# Patient Record
Sex: Male | Born: 1960 | Race: Black or African American | Hispanic: No | Marital: Single | Smoking: Never smoker
Health system: Southern US, Community
[De-identification: ages and names within clinical notes are randomized; demographics above are authoritative.]

## PROBLEM LIST (undated history)

## (undated) DIAGNOSIS — F1010 Alcohol abuse, uncomplicated: Secondary | ICD-10-CM

## (undated) DIAGNOSIS — I509 Heart failure, unspecified: Secondary | ICD-10-CM

## (undated) DIAGNOSIS — M1990 Unspecified osteoarthritis, unspecified site: Secondary | ICD-10-CM

## (undated) DIAGNOSIS — N189 Chronic kidney disease, unspecified: Secondary | ICD-10-CM

## (undated) DIAGNOSIS — K219 Gastro-esophageal reflux disease without esophagitis: Secondary | ICD-10-CM

## (undated) DIAGNOSIS — D649 Anemia, unspecified: Secondary | ICD-10-CM

## (undated) DIAGNOSIS — I10 Essential (primary) hypertension: Secondary | ICD-10-CM

## (undated) DIAGNOSIS — M109 Gout, unspecified: Secondary | ICD-10-CM

## (undated) DIAGNOSIS — S32009A Unspecified fracture of unspecified lumbar vertebra, initial encounter for closed fracture: Secondary | ICD-10-CM

## (undated) DIAGNOSIS — C61 Malignant neoplasm of prostate: Secondary | ICD-10-CM

## (undated) DIAGNOSIS — K7460 Unspecified cirrhosis of liver: Secondary | ICD-10-CM

## (undated) DIAGNOSIS — Z9289 Personal history of other medical treatment: Secondary | ICD-10-CM

## (undated) HISTORY — PX: FRACTURE SURGERY: SHX138

## (undated) HISTORY — DX: Chronic kidney disease, unspecified: N18.9

## (undated) HISTORY — PX: ACHILLES TENDON REPAIR: SUR1153

## (undated) HISTORY — DX: Essential (primary) hypertension: I10

## (undated) HISTORY — DX: Unspecified cirrhosis of liver: K74.60

## (undated) HISTORY — DX: Unspecified fracture of unspecified lumbar vertebra, initial encounter for closed fracture: S32.009A

## (undated) HISTORY — DX: Anemia, unspecified: D64.9

---

## 1999-05-31 ENCOUNTER — Ambulatory Visit (HOSPITAL_BASED_OUTPATIENT_CLINIC_OR_DEPARTMENT_OTHER): Admission: RE | Admit: 1999-05-31 | Discharge: 1999-05-31 | Payer: Self-pay | Admitting: Orthopedic Surgery

## 2008-10-08 ENCOUNTER — Emergency Department (HOSPITAL_COMMUNITY): Admission: EM | Admit: 2008-10-08 | Discharge: 2008-10-08 | Payer: Self-pay | Admitting: Emergency Medicine

## 2010-02-23 DIAGNOSIS — K7030 Alcoholic cirrhosis of liver without ascites: Secondary | ICD-10-CM

## 2010-03-20 ENCOUNTER — Inpatient Hospital Stay (HOSPITAL_COMMUNITY): Admission: EM | Admit: 2010-03-20 | Discharge: 2010-03-29 | Payer: Self-pay | Admitting: Emergency Medicine

## 2010-03-20 ENCOUNTER — Ambulatory Visit: Payer: Self-pay | Admitting: Family Medicine

## 2010-03-22 ENCOUNTER — Ambulatory Visit: Payer: Self-pay | Admitting: Gastroenterology

## 2010-04-09 ENCOUNTER — Inpatient Hospital Stay (HOSPITAL_COMMUNITY): Admission: EM | Admit: 2010-04-09 | Discharge: 2010-05-06 | Payer: Self-pay | Admitting: Emergency Medicine

## 2010-04-09 ENCOUNTER — Ambulatory Visit: Payer: Self-pay | Admitting: Family Medicine

## 2010-04-21 ENCOUNTER — Encounter: Payer: Self-pay | Admitting: Family Medicine

## 2010-04-26 ENCOUNTER — Ambulatory Visit: Payer: Self-pay | Admitting: Physical Medicine & Rehabilitation

## 2010-05-18 ENCOUNTER — Inpatient Hospital Stay (HOSPITAL_COMMUNITY): Admission: EM | Admit: 2010-05-18 | Discharge: 2010-06-07 | Payer: Self-pay | Admitting: Emergency Medicine

## 2010-05-18 ENCOUNTER — Ambulatory Visit: Payer: Self-pay | Admitting: Family Medicine

## 2010-05-25 ENCOUNTER — Ambulatory Visit: Payer: Self-pay | Admitting: Gastroenterology

## 2010-05-26 ENCOUNTER — Encounter: Payer: Self-pay | Admitting: Gastroenterology

## 2010-05-28 ENCOUNTER — Encounter: Payer: Self-pay | Admitting: Family Medicine

## 2010-05-28 ENCOUNTER — Encounter: Payer: Self-pay | Admitting: Gastroenterology

## 2010-06-02 ENCOUNTER — Encounter: Payer: Self-pay | Admitting: Gastroenterology

## 2010-06-09 ENCOUNTER — Inpatient Hospital Stay (HOSPITAL_COMMUNITY): Admission: EM | Admit: 2010-06-09 | Discharge: 2010-06-10 | Payer: Self-pay | Admitting: Emergency Medicine

## 2010-06-23 ENCOUNTER — Telehealth: Payer: Self-pay | Admitting: Family Medicine

## 2010-07-19 ENCOUNTER — Encounter: Payer: Self-pay | Admitting: Family Medicine

## 2010-07-20 ENCOUNTER — Telehealth: Payer: Self-pay | Admitting: *Deleted

## 2010-07-23 ENCOUNTER — Telehealth: Payer: Self-pay | Admitting: Family Medicine

## 2010-07-27 ENCOUNTER — Encounter: Payer: Self-pay | Admitting: Family Medicine

## 2010-07-29 ENCOUNTER — Ambulatory Visit: Payer: Self-pay | Admitting: Family Medicine

## 2010-07-29 DIAGNOSIS — K7200 Acute and subacute hepatic failure without coma: Secondary | ICD-10-CM

## 2010-07-29 DIAGNOSIS — S32009A Unspecified fracture of unspecified lumbar vertebra, initial encounter for closed fracture: Secondary | ICD-10-CM

## 2010-07-29 DIAGNOSIS — K7682 Hepatic encephalopathy: Secondary | ICD-10-CM | POA: Insufficient documentation

## 2010-07-29 DIAGNOSIS — D594 Other nonautoimmune hemolytic anemias: Secondary | ICD-10-CM | POA: Insufficient documentation

## 2010-07-29 DIAGNOSIS — K7290 Hepatic failure, unspecified without coma: Secondary | ICD-10-CM

## 2010-07-29 HISTORY — DX: Unspecified fracture of unspecified lumbar vertebra, initial encounter for closed fracture: S32.009A

## 2010-08-02 ENCOUNTER — Telehealth: Payer: Self-pay | Admitting: *Deleted

## 2010-08-02 ENCOUNTER — Telehealth (INDEPENDENT_AMBULATORY_CARE_PROVIDER_SITE_OTHER): Payer: Self-pay | Admitting: Family Medicine

## 2010-08-04 DIAGNOSIS — K7460 Unspecified cirrhosis of liver: Secondary | ICD-10-CM

## 2010-08-06 ENCOUNTER — Telehealth (INDEPENDENT_AMBULATORY_CARE_PROVIDER_SITE_OTHER): Payer: Self-pay | Admitting: *Deleted

## 2010-08-13 ENCOUNTER — Ambulatory Visit: Payer: Self-pay | Admitting: Family Medicine

## 2010-08-13 ENCOUNTER — Encounter: Admission: RE | Admit: 2010-08-13 | Discharge: 2010-08-13 | Payer: Self-pay | Admitting: Family Medicine

## 2010-08-13 DIAGNOSIS — M2550 Pain in unspecified joint: Secondary | ICD-10-CM

## 2010-08-18 ENCOUNTER — Telehealth: Payer: Self-pay | Admitting: Family Medicine

## 2010-08-26 ENCOUNTER — Encounter: Payer: Self-pay | Admitting: Family Medicine

## 2010-08-31 ENCOUNTER — Ambulatory Visit: Payer: Self-pay | Admitting: Family Medicine

## 2010-08-31 ENCOUNTER — Encounter: Payer: Self-pay | Admitting: Family Medicine

## 2010-08-31 LAB — CONVERTED CEMR LAB
Albumin: 4 g/dL (ref 3.5–5.2)
CO2: 24 meq/L (ref 19–32)
Chloride: 106 meq/L (ref 96–112)
HCT: 27.8 % — ABNORMAL LOW (ref 39.0–52.0)
MCHC: 34.2 g/dL (ref 30.0–36.0)
MCV: 90.6 fL (ref 78.0–100.0)
Potassium: 4.2 meq/L (ref 3.5–5.3)
RDW: 14.5 % (ref 11.5–15.5)

## 2010-09-13 ENCOUNTER — Telehealth (INDEPENDENT_AMBULATORY_CARE_PROVIDER_SITE_OTHER): Payer: Self-pay | Admitting: *Deleted

## 2010-09-29 ENCOUNTER — Ambulatory Visit: Payer: Self-pay | Admitting: Family Medicine

## 2010-11-11 ENCOUNTER — Telehealth (INDEPENDENT_AMBULATORY_CARE_PROVIDER_SITE_OTHER): Payer: Self-pay | Admitting: *Deleted

## 2010-11-22 ENCOUNTER — Telehealth: Payer: Self-pay | Admitting: Family Medicine

## 2010-11-23 ENCOUNTER — Encounter: Payer: Self-pay | Admitting: Family Medicine

## 2010-11-24 ENCOUNTER — Encounter: Payer: Self-pay | Admitting: *Deleted

## 2010-12-06 ENCOUNTER — Encounter: Payer: Self-pay | Admitting: Family Medicine

## 2010-12-06 ENCOUNTER — Ambulatory Visit: Payer: Self-pay | Admitting: Family Medicine

## 2010-12-06 LAB — CONVERTED CEMR LAB
AST: 41 units/L — ABNORMAL HIGH (ref 0–37)
Albumin: 4.3 g/dL (ref 3.5–5.2)
BUN: 26 mg/dL — ABNORMAL HIGH (ref 6–23)
CO2: 23 meq/L (ref 19–32)
Calcium: 9.6 mg/dL (ref 8.4–10.5)
Chloride: 102 meq/L (ref 96–112)
Creatinine, Ser: 1.73 mg/dL — ABNORMAL HIGH (ref 0.40–1.50)
HCT: 29.4 % — ABNORMAL LOW (ref 39.0–52.0)
Lymphocytes Relative: 32 % (ref 12–46)
Lymphs Abs: 1.2 10*3/uL (ref 0.7–4.0)
MCHC: 35 g/dL (ref 30.0–36.0)
MCV: 86 fL (ref 78.0–100.0)
Monocytes Absolute: 0.3 10*3/uL (ref 0.1–1.0)
Neutrophils Relative %: 52 % (ref 43–77)
RDW: 14.8 % (ref 11.5–15.5)
Sodium: 139 meq/L (ref 135–145)
Total Bilirubin: 1.5 mg/dL — ABNORMAL HIGH (ref 0.3–1.2)

## 2011-01-25 NOTE — Progress Notes (Signed)
Summary: phn msg  Phone Note Call from Patient Call back at [phone]   Caller: sister - Calvert Cantor Summary of Call: is requesting to talk to Dr Gomez Cleverly about brother. Initial call taken by: De Nurse,  June 23, 2010 2:08 PM    Returned call 06-24-10 Spoke with sister, Selena Batten.  Is concerned about pt getting a diueretic in [place].  Wonders if this is ok?  Pt resident at [facility] and is under the care of the MD at that facility.  Explained that I could not adequatley detemine whether or not this was needed as I have not seen pt since hospital d/c.  Explained that pt is under the care of the MD at the facility and they needed to speak with that provider about any concerns.  I am happy to see pt in clinic once d/c'd.  Voiced understanding.

## 2011-01-25 NOTE — Progress Notes (Signed)
Summary: phn msg  Phone Note Call from Patient Call back at Home Phone [phone]   Caller: Patient Summary of Call: needs to talk to doctor before he goes to court on Friday Initial call taken by: De Nurse,  August 18, 2010 3:04 PM     08-18-10 1709 Returned call to patient at home. Pt states he has to go to court on Friday due to a DWI that he recieved 2 years ago.   Wants to know if he should keep court date or try and get in re-scheduled due to his current medical condition. Advised patient that medically he was stable and in my opinion could attend court hearing.   He voiced understanding.

## 2011-01-25 NOTE — Progress Notes (Signed)
----   Converted from flag ---- ---- 07/30/2010 2:06 PM, Alvia Grove DO wrote: could you set up a lumar spine xray for him for next week, please? I put the order in. Thanks! Kendal Hymen ------------------------------

## 2011-01-25 NOTE — Progress Notes (Signed)
  Phone Note Outgoing Call   Call placed by: Jimmy Footman, CMA,  August 02, 2010 9:02 AM Call placed to: Patient Summary of Call: Called and LVM for pt. to call back office to inform of xray that was ordered by pcp. Pt. can walk into radiology at anytime between 7am-7pm. Order was faxed over.

## 2011-01-25 NOTE — Progress Notes (Signed)
Summary: Rx Req  Phone Note Call from Patient Call back at [phone]   Caller: Sister-Kim Summary of Call: William [name] will not do anything and wondering if Dr. Gomez Cleverly will write his rx's for Generlac and his Lasix.   Initial call taken by: Clydell Hakim,  July 23, 2010 2:03 PM    Yes, I certainly will.  But, I need documents from the nursing home in regards to medication doseage and clinical course during his stay.  Does he have copies of that information?  If not, then we need to get a release of information sent to that facilitiy so that I can give him the correct medicines and doses.   Appended Document: Rx Req William [name] with [name] called and was informed of Dr. Cyndra Numbers note.  He said he would let the family know.

## 2011-01-25 NOTE — Letter (Signed)
Summary: Results Letter  [facility]  [address] [place], [place] [address]   Phone: [phone]  Fax: [phone]        June 02, 2010 MRN: [id number]    William [name] [address] [place], [place]  [address]    Dear [name],  Your colon biopsy results did not show any remarkable findings.  I recommend you undergo followup colonoscopy in 10 years.  Please continue with the recommendations previously discussed.  Should you have any further questions or immediate concers, feel free to contact me.  Sincerely,  Barbette Hair. Arlyce Dice, M.D., [facility]          Sincerely,  Louis Meckel MD  This letter has been electronically signed by your physician.  Appended Document: Results Letter Letter mailed to patient. Recall is in IDX for 05/2020.

## 2011-01-25 NOTE — Progress Notes (Signed)
Summary: phn msg  Phone Note Call from Patient Call back at [phone]   Caller: sister-Kim Summary of Call: Can he stop the Generlac or does he need to continue this.   Initial call taken by: Clydell Hakim,  July 20, 2010 2:21 PM  Follow-up for Phone Call        He should continue all of his medicines from the nursing home until he is seen by our clinic  Additional Follow-up for Phone Call Additional follow up Details #1::        LMOVM for pt to call back Additional Follow-up by: Jone Baseman CMA,  July 21, 2010 8:42 AM    Additional Follow-up for Phone Call Additional follow up Details #2::    Selena Batten informed of the above. Follow-up by: Jone Baseman CMA,  July 21, 2010 9:20 AM

## 2011-01-25 NOTE — Assessment & Plan Note (Signed)
Summary: h/fup,tcb   Vital Signs:  Patient profile:   50 year old male Weight:      194 pounds Temp:     98.9 degrees F oral Pulse rate:   87 / minute BP sitting:   124 / 72  (left arm) Cuff size:   regular  Vitals Entered By: Jimmy Footman, CMA (July 29, 2010 3:46 PM) CC: hospital f/u   Primary Care Provider:  Alvia Grove DO  CC:  hospital f/u.  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 1. 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  States they drew labs, but he is unsure of the results.  denies any alcholol use since being discharged from the hursing home (about 3 weeks ago).  Ultimatley likely needs liver transplant for which he can only qualify for if he abstains from alcholol for >6 months.  He denies any alcholol use since his first admission to the hospital in March 2011. Is very aware of the fact that in order to even be considered for transplant must not drink any alcholol.  Denies any cravings or any withdraw symptoms.  Is living alone now, with his daughter close by, and home health care. 2. Anemia:  no dizziness, no weakness, no fatigue.  Feels he is making good progress in his recovery and that he is getting stronger daily.  Able to perform most ADL's, nearing prior baseline.  Tolerating good by mouth intake 3. s/p lumbar compression fracture: continues to wear back brace daily.  Feels this helps.  Sleeps without it and has minimal pain. Not requiring any pain meds.   Habits & Providers  Alcohol-Tobacco-Diet     Alcohol drinks/day: 0     Alcohol Counseling: to STOP drinking     Alcohol type: none     >5/day in last 3 mos: no     Tobacco Status: never  Exercise-Depression-Behavior     Have you felt down or hopeless? no     STD Risk: past     Drug Use: no  Comments: Previous heavy EtOH use.  Last drink in March 2011.  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 1 Pill By Mouth Daily 2)  Hydroxyzine Hcl 10 Mg Tabs (Hydroxyzine Hcl) .Marland Kitchen.. 1 Pill By Mouth Q 6hrs Prn Itching 3)  Vitamin B-1 100 Mg Tabs (Thiamine Hcl) .Marland Kitchen.. 1 Pill By Mouth Daily 4)  Pantoprazole Sodium 40 Mg Tbec (Pantoprazole Sodium) .Marland Kitchen.. 1 Pill By Mouth Daily 5)  Generlac 10 Gm/25ml Soln (Lactulose Encephalopathy) .... Take 45ml (30 Gm) By Mouth Before Meals and Qhs 6)  Nadolol 20 Mg Tabs (Nadolol) .Marland Kitchen.. 1 Pill By Mouth Daily 7)  Fortical 200 Unit/act Soln (Calcitonin (Salmon)) .Marland Kitchen.. 1 Spray in Alternating Nostrils Every Day 8)  Sodium Bicarbonate 650 Mg Tabs (Sodium Bicarbonate) .Marland Kitchen.. 1 Tab Three Times A Day With Meals 9)  Tramadol Hcl 50 Mg Tabs (Tramadol Hcl) .Marland Kitchen.. 1 Pill By Mouth Q6hrs As Needed Pain 10)  Furosemide 20 Mg Tabs (Furosemide) .... Take 1 Pill By Mouth Daily 11)  Calcium 600 1500 Mg Tabs (Calcium Carbonate) .... Take 1 Pill By Mouth Daily 12)  [facility]vs Iron 325 (65 Fe) Mg Tabs (Ferrous Sulfate) .Marland KitchenMarland KitchenMarland Kitchen 1  Paill By Mouth Daily  Allergies (verified): No Known Drug Allergies  Past History:  Past Medical History: anemia Cirrhosis Blood transfusion hepatic encephalopathy  Past History:  Care Management: *Hospitalized for:, Gastroenterology:  Family History: unknown  Social History: Reviewed history and no changes required. Single Divorced Heavy EtoH use in the past, last drink was March 2011 Lives in [place] alone, has 1 daughter good family support Drug use-no Smoking Status:  never Drug Use:  no STD Risk:  past Blood Transfusions:  yes  Review of Systems  The patient denies anorexia, weight loss, syncope, dyspnea on exertion, peripheral edema, hemoptysis, abdominal pain, melena, hematochezia, incontinence, muscle weakness, suspicious skin lesions, difficulty walking,  unusual weight change, and abnormal bleeding.    Physical Exam  General:  VS reviewed, well-developed, well-nourished, and well-hydrated.   Head:  normocephalic and atraumatic.   Eyes:  scleral icterus (much improved since hospitalization)vision grossly intact, pupils equal, pupils round, pupils reactive to light, and scleral icterus.   Mouth:  pharynx pink and moist.   Neck:  supple, full ROM, and no masses.   Lungs:  Normal respiratory effort, chest expands symmetrically. Lungs are clear to auscultation, no crackles or wheezes. Heart:  Normal rate and regular rhythm. S1 and S2 normal without gallop, murmur, click, rub or other extra sounds. Abdomen:  hepatomegally, non-tender, normal bowel sounds, no rebound tenderness, and no splenomegaly.   Msk:  in back brace Extremities:  No clubbing, cyanosis, edema, or deformity noted with normal full range of motion of all joints.   Neurologic:  alert & oriented X3, cranial nerves II-XII intact, strength normal in all extremities, sensation intact to light touch, sensation intact to pinprick, and gait normal.   Skin:  jaundice Cervical Nodes:  No lymphadenopathy noted Psych:  good eye contact, not anxious appearing, and not depressed appearing.     Impression & Recommendations:  Problem # 1:  ALCOHOLIC CIRRHOSIS OF LIVER (ICD-571.2) Assessment Improved  Doing very well since d/c from [facility].  Eating well, stronger and able to do own ADL's. Abstaining from alcholol and following acutely with GI.   Will request recent labs and recent OV from GI for more complete picture.   Orders: [facility]- Est  Level 4 ([address])  Problem # 2:  COMPRESSION FRACTURE, LUMBAR VERTEBRAE (ICD-805.4) Assessment: Improved Minimal pain.  Wearing brace for security now as opposed to acute need.  Will get repeat films of lumbar spine.  Orders: Radiology other (Radiology Other)  Problem # 3:  ANEMIA, HEMOLYTIC, NON-AUTOIMMUNE (ICD-283.19) Continuosly low in hospital in June  and required multiple transfusions.  Would fall below threshold of 7.0 likely in some cases 2/2 to phlebotomy. Since he just had labs done at GI office, will request results and hold off from drawing more blood right now.  Continue current meds while awaitng results.  His updated medication list for this problem includes:    Folic Acid 1 Mg Tabs (Folic acid) .Marland Kitchen... 1 pill by mouth daily    Cvs Iron 325 (65 Fe) Mg Tabs (Ferrous sulfate) .Marland Kitchen... 1 paill by mouth daily  Problem # 4:  HEPATIC ENCEPHALOPATHY (ICD-572.2) Assessment: Improved continue lactulose for now.  Re-address at f/u when labs available.   Orders: [facility]- Est  Level 4 ([address])  Complete Medication List: 1)  Folic Acid 1 Mg Tabs (Folic acid) .Marland Kitchen.. 1 pill by mouth daily 2)  Hydroxyzine Hcl 10 Mg Tabs (Hydroxyzine hcl) .Marland Kitchen.. 1 pill by mouth q 6hrs prn itching 3)  Vitamin B-1 100  Mg Tabs (Thiamine hcl) .Marland Kitchen.. 1 pill by mouth daily 4)  Pantoprazole Sodium 40 Mg Tbec (Pantoprazole sodium) .Marland Kitchen.. 1 pill by mouth daily 5)  Generlac 10 Gm/55ml Soln (Lactulose encephalopathy) .... Take 45ml (30 gm) by mouth before meals and qhs 6)  Nadolol 20 Mg Tabs (Nadolol) .Marland Kitchen.. 1 pill by mouth daily 7)  Fortical 200 Unit/act Soln (Calcitonin (salmon)) .Marland Kitchen.. 1 spray in alternating nostrils every day 8)  Sodium Bicarbonate 650 Mg Tabs (Sodium bicarbonate) .Marland Kitchen.. 1 tab three times a day with meals 9)  Tramadol Hcl 50 Mg Tabs (Tramadol hcl) .Marland Kitchen.. 1 pill by mouth q6hrs as needed pain 10)  Furosemide 20 Mg Tabs (Furosemide) .... Take 1 pill by mouth daily 11)  Calcium 600 1500 Mg Tabs (Calcium carbonate) .... Take 1 pill by mouth daily 12)  Cvs Iron 325 (65 Fe) Mg Tabs (Ferrous sulfate) .Marland Kitchen.. 1 paill by mouth daily  Patient Instructions: 1)  Great to see you! 2)  [facility] 3)  Please schedule a follow-up appointment in 2 weeks.  Prescriptions: [facility] IRON 325 (65 FE) MG TABS (FERROUS SULFATE) 1 paill by mouth daily  #30 x 5   Entered and  Authorized by:   Alvia Grove DO   Signed by:   Alvia Grove DO on 07/29/2010   Method used:   [facility]ically to        [facility]  [address] [id number] ([facility]ail[facility]       [address]       [place], [place]  [address]       Ph: [phone] or [phone]       Fax: [phone]   RxID:   [id number] CALCIUM 600 1500 MG TABS (CALCIUM CARBONATE) take 1 pill by mouth daily  #30 x 5   Entered and Authorized by:   Alvia Grove DO   Signed by:   Alvia Grove DO on 07/29/2010   Method used:   [facility]ically to        [facility]  [address] [id number] (retail)       [address]       [place], [place]  [address]       Ph: [phone] or [phone]       Fax: [phone]   RxID:   [id number] FUROSEMIDE 20 MG TABS (FUROSEMIDE) take 1 pill by mouth daily  #30 x 5   Entered and Authorized by:   Alvia Grove DO   Signed by:   Alvia Grove DO on 07/29/2010   Method used:   [facility]ically [facility]        [facility]  [address] [id number] ([facility]ail)       [address]       [place], [place]  [address]       Ph: [phone] or [phone]       Fax: [phone]   RxID:   [id number] TRAMADOL HCL 50 MG TABS (TRAMADOL HCL) 1 pill by mouth Q6hrs as needed pain  #90 x 1   Entered and Authorized by:   Alvia Grove DO   Signed by:   Alvia Grove DO on 07/29/2010   Method used:   [facility]ically [facility]        [facility]  [address] [id number] ([facility])       [address]       [facility]  [place], [place]  [address]       Ph: [phone] or [phone]       Fax: [phone]   RxID:   [id number] SODIUM BICARBONATE 650 MG TABS (SODIUM BICARBONATE) 1 tab three times a day with meals  #90 x 5   Entered and Authorized by:   Alvia Grove DO   Signed by:   Alvia Grove DO on 07/29/2010   Method used:   [facility]ically [facility]        [facility]  [address] [id number] ([facility]ail)       [address]       [place], [place]  [address]        Ph: [phone] or [phone]       Fax: [phone]   RxID:   [id number] FORTICAL 200 UNIT/ACT SOLN (CALCITONIN (SALMON)) 1 spray in alternating nostrils every day  #2 x 5   Entered and Authorized by:   Alvia Grove DO   Signed by:   Alvia Grove DO on 07/29/2010   Method used:   [facility]ically to        [facility]  [address] [facility] ([facility]ail)       [address]       [place], [place]  [address]       Ph: [phone] or [phone]       Fax: [phone]   RxID:   [id number] NADOLOL 20 MG TABS (NADOLOL) 1 pill by mouth daily  #30 x 5   Entered and Authorized by:   Alvia Grove DO   Signed by:   Alvia Grove DO on 07/29/2010   Method used:   [facility]ically to        [facility]  [address] [facility] ([facility])       [address]       [place], [place]  [address]       Ph: [phone] or [phone]       Fax: [phone]   RxID:   [id number] GENERLAC 10 GM/15ML SOLN (LACTULOSE ENCEPHALOPATHY) take 45ml (30 gm) by mouth before meals and QHS  #5.5 Liters x 2   Entered and Authorized by:   Alvia Grove DO   Signed by:   Alvia Grove DO on 07/29/2010   Method used:   [facility]ically to        [facility]  [address] [facility] ([facility]       [address]       [place], [place]  [address]       Ph: [phone] or [phone]       Fax: [phone]   RxID:   [id number] PANTOPRAZOLE SODIUM 40 MG TBEC (PANTOPRAZOLE SODIUM) 1 pill by mouth daily  #30 x 5   Entered and Authorized by:   Alvia Grove DO   Signed by:   Alvia Grove DO on 07/29/2010   Method used:   [facility]ronically to        [facility]  [address] [facility] ([facility])       [address]       [place], [place]  [address]       Ph: [phone] or [phone]       Fax: [phone]   RxID:   [id number] VITAMIN B-1 100 MG TABS (THIAMINE HCL) 1 pill by mouth daily  #30  x 5   Entered and Authorized by:   Alvia Grove DO   Signed by:    Alvia Grove DO on 07/29/2010   Method used:   [facility]ronically to        [facility]  [address] [facility] ([facility])       [address]       [place], [place]  [address]       Ph: [phone] or [phone]       Fax: [phone]   RxID:   [id number] HYDROXYZINE HCL 10 MG TABS (HYDROXYZINE HCL) 1 pill by mouth Q 6hrs PRN itching  #120 x 1   Entered and Authorized by:   Alvia Grove DO   Signed by:   Alvia Grove DO on 07/29/2010   Method used:   [facility]ronically to        [facility]  [address] [facility] ([facility])       [address]       [place], [place]  [address]       Ph: [phone] or [phone]       Fax: [phone]   RxID:   [id number] FOLIC ACID 1 MG TABS (FOLIC ACID) 1 pill by mouth daily  #30 x 5   Entered and Authorized by:   Alvia Grove DO   Signed by:   Alvia Grove DO on 07/29/2010   Method used:   [facility]ronically to        [facility]  [address] [facility])       [address]       [place], [place]  [address]       Ph: [phone] or [phone]       Fax: [phone]   RxID:   [id number]

## 2011-01-25 NOTE — Assessment & Plan Note (Signed)
Summary: f/u/kh   Vital Signs:  Patient profile:   50 year old male Weight:      188 pounds Temp:     98.5 degrees F Pulse rate:   80 / minute BP sitting:   131 / 77  Vitals Entered By: Jone Baseman CMA (August 13, 2010 2:09 PM) CC: f/u Is Patient Diabetic? No Pain Assessment Patient in pain? no        Primary Care Provider:  Alvia Grove DO  CC:  f/u.  History of Present Illness: 50 yo male, here for follow up of last visit. 1. 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 Requested labs and documentation from GI office, still awaiting info.   Ultimatley likely needs liver transplant for which he can only qualify for if he abstains from alcholol for >6 months.  He denies any alcholol use since his first admission to the hospital in March 2011. Is very aware of the fact that in order to even be considered for transplant must not drink any alcholol.  Denies any cravings or any withdraw symptoms.  Is living alone now, with his daughter close by, and home health care. 2. Anemia:  no dizziness, no weakness, no fatigue.  Able to perform most ADL's, nearing prior baseline.  Tolerating good by mouth intake 3. s/p lumbar compression fracture: Not wearing back brace anymore, was suppose to get repeat xray's but has not done that yet.  Denies any pain at all.  Has started lifting weights and feels he is getting stronger.  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 1 Pill By Mouth Daily 2)  Hydroxyzine Hcl 10 Mg Tabs (Hydroxyzine Hcl) .Marland Kitchen.. 1 Pill By Mouth Q 6hrs Prn Itching 3)  Vitamin B-1 100 Mg Tabs (Thiamine Hcl) .Marland Kitchen.. 1 Pill By Mouth Daily 4)  Pantoprazole Sodium 40 Mg Tbec (Pantoprazole Sodium) .Marland Kitchen.. 1 Pill By Mouth Daily 5)  Generlac 10 Gm/13ml Soln (Lactulose Encephalopathy) .... Take 45ml (30 Gm) By Mouth Before Meals and Qhs 6)  Nadolol 20 Mg Tabs (Nadolol) .Marland Kitchen.. 1 Pill By Mouth Daily 7)  Fortical 200 Unit/act Soln (Calcitonin (Salmon)) .Marland Kitchen.. 1 Spray in Alternating Nostrils Every Day 8)  Sodium Bicarbonate 650 Mg Tabs (Sodium Bicarbonate) .Marland Kitchen.. 1 Tab Two Times A Day With Meals 9)  Tramadol Hcl 50 Mg Tabs (Tramadol Hcl) .Marland Kitchen.. 1 Pill By Mouth Q6hrs As Needed Pain 10)  Furosemide 20 Mg Tabs (Furosemide) .... Take 1 Pill By Mouth Daily 11)  Calcium 600 1500 Mg Tabs (Calcium Carbonate) .... Take 1 Pill By Mouth Daily 12)  Cvs Iron 325 (65 Fe) Mg Tabs (Ferrous Sulfate) .Marland Kitchen.. 1 Paill By Mouth Daily 13)  Ibuprofen 600 Mg Tabs (Ibuprofen) .... Take 1 Pill By Mouth Q12 Hours As Needed Pain  Allergies (verified): No Known Drug Allergies  Past History:  Past Medical History: Last updated: 07/29/2010 anemia Cirrhosis Blood transfusion hepatic encephalopathy  Family History: Last updated: 07/29/2010 unknown  Social History: Last updated: 07/29/2010 Single Divorced Heavy EtoH use in the past, last drink was March 2011 Lives in [place] alone, has 1 daughter good family support Drug use-no  Risk Factors: Alcohol Use: 0 (08/13/2010) >5 drinks/d w/in last 3 months: no (08/13/2010)  Risk Factors: Smoking Status: never (08/13/2010)  Review of Systems  The patient denies anorexia, fever, weight loss, weight gain, vision loss, decreased hearing, hoarseness, chest pain, syncope, dyspnea on exertion, peripheral edema, prolonged cough, headaches, hemoptysis, abdominal pain, melena, hematochezia, severe indigestion/heartburn,  hematuria, incontinence, genital sores, muscle weakness, suspicious skin lesions, transient blindness, difficulty walking, depression, unusual weight change, abnormal bleeding, enlarged lymph nodes, angioedema, breast masses, and testicular masses.    Physical Exam  General:  VS reviewed, alert, well-developed, well-nourished, and well-hydrated.   Eyes:  scleral icterus (much improved since hospitalization)vision grossly intact, pupils equal, pupils round, pupils reactive to light, and scleral icterus.   Lungs:  Normal respiratory effort, chest expands symmetrically. Lungs are clear to auscultation, no crackles or wheezes. Heart:  Normal rate and regular rhythm. S1 and S2 normal without gallop, murmur, click, rub or other extra sounds. Abdomen:  hepatomegally, non-tender, normal bowel sounds, no rebound tenderness, and no splenomegaly.   Extremities:  No clubbing, cyanosis, edema, or deformity noted with normal full range of motion of all joints.   Neurologic:  alert & oriented X3, cranial nerves II-XII intact, strength normal in all extremities, sensation intact to light touch, sensation intact to pinprick, and gait normal.     Impression & Recommendations:  Problem # 1:  CIRRHOSIS (ICD-571.5) Assessment Improved stable.  Hopeful that abstienence from alcholol will be his ultimate cure.  Remain cautiously optomistic.  Start to decrease sodium bicarb, labs at next visit.  His updated medication list for this problem includes:    Nadolol 20 Mg Tabs (Nadolol) .Marland Kitchen... 1 pill by mouth daily    Furosemide 20 Mg Tabs (Furosemide) .Marland Kitchen... Take 1 pill by mouth daily  Problem # 2:  HEPATIC ENCEPHALOPATHY (ICD-572.2) improving.   Problem # 3:  COMPRESSION FRACTURE, LUMBAR VERTEBRAE (ICD-805.4) Assessment: Unchanged need repeat xray, pt to go to radiology today.  Orders: [facility]- Est  Level 4 (45409)  Problem # 4:  ANEMIA, HEMOLYTIC, NON-AUTOIMMUNE (ICD-283.19) Assessment: Unchanged awaiting labs from  GI office.  No s/s of anemia.  Continue to monitor.  His updated medication list for this problem includes:    Folic Acid 1 Mg Tabs (Folic acid) .Marland Kitchen... 1 pill by mouth daily    Cvs Iron 325 (65 Fe) Mg Tabs (Ferrous sulfate) .Marland Kitchen... 1 paill by mouth daily  Complete Medication List: 1)  Folic Acid 1 Mg Tabs (Folic acid) .Marland Kitchen.. 1 pill by mouth daily 2)  Hydroxyzine Hcl 10 Mg Tabs (Hydroxyzine hcl) .Marland Kitchen.. 1 pill by mouth q 6hrs prn itching 3)  Vitamin B-1 100 Mg Tabs (Thiamine hcl) .Marland Kitchen.. 1 pill by mouth daily 4)  Pantoprazole Sodium 40 Mg Tbec (Pantoprazole sodium) .Marland Kitchen.. 1 pill by mouth daily 5)  Generlac 10 Gm/33ml Soln (Lactulose encephalopathy) .... Take 45ml (30 gm) by mouth before meals and qhs 6)  Nadolol 20 Mg Tabs (Nadolol) .Marland Kitchen.. 1 pill by mouth daily 7)  Fortical 200 Unit/act Soln (Calcitonin (salmon)) .Marland Kitchen.. 1 spray in alternating nostrils every day 8)  Sodium Bicarbonate 650 Mg Tabs (Sodium bicarbonate) .Marland Kitchen.. 1 tab two times a day with meals 9)  Tramadol Hcl 50 Mg Tabs (Tramadol hcl) .Marland Kitchen.. 1 pill by mouth q6hrs  as needed pain 10)  Furosemide 20 Mg Tabs (Furosemide) .... Take 1 pill by mouth daily 11)  Calcium 600 1500 Mg Tabs (Calcium carbonate) .... Take 1 pill by mouth daily 12)  Cvs Iron 325 (65 Fe) Mg Tabs (Ferrous sulfate) .Marland Kitchen.. 1 paill by mouth daily 13)  Ibuprofen 600 Mg Tabs (Ibuprofen) .... Take 1 pill by mouth q12 hours as needed pain  Patient Instructions: 1)  Take Ibuprofen for your joint pain 2)  Decrease your sodium bicarb to twice a day 3)  Follow up in 2 weeks, we will get labs at that visit. 4)  Consider going to AA and make sure you do not drink ANY alcholol. Prescriptions: IBUPROFEN 600 MG TABS (IBUPROFEN) take 1 pill by mouth Q12 hours as needed pain  #60 x 2   Entered and Authorized by:   Alvia Grove DO   Signed by:   Alvia Grove DO on 08/13/2010   Method used:   [facility]ronically to        [facility]  [id number]       [address]       [place], [place]  [address]       Ph: [phone] or [phone]       Fax: [phone]   RxID:   [id number]

## 2011-01-25 NOTE — Letter (Signed)
Summary: Generic Letter  [facility]  [address]   [place], [place] [address]   Phone: [phone]  Fax: [phone]    11/24/2010  ORIS STAFFIERI [address] [place], [place]  [address]  To whom it may concern,  Mr. Emmaus Brandi (DOB 11/24/2061), has End Stage Liver Disease as well as a Lumbar Vertebrae Compression Fracture which may prevent him from using [employer] regular bus system. If you have any other questions please feel free to contact our office.         Sincerely,   Alvia Grove DO

## 2011-01-25 NOTE — Procedures (Signed)
Summary: Colonoscopy  Patient: William [name] Note: All result statuses are Final unless otherwise noted.  Tests: (1) Colonoscopy (COL)   COL Colonoscopy           DONE     [facility]     [address]     [place], [place]  [address]           COLONOSCOPY PROCEDURE REPORT           PATIENT:  [name], William  MR#:  [id number]     BIRTHDATE:  1961/01/23, 49 yrs. old  GENDER:  male           ENDOSCOPIST:  Barbette Hair. Arlyce Dice, MD     Referred by:  Doralee Albino, M.D.           PROCEDURE DATE:  05/28/2010     PROCEDURE:  Colonoscopy with snare polypectomy     ASA CLASS:  Class III     INDICATIONS:  1) heme positive stool           MEDICATIONS:   Fentanyl 75 mcg IV, Versed 7 mg           DESCRIPTION OF PROCEDURE:   After the risks benefits and     alternatives of the procedure were thoroughly explained, informed     consent was obtained.  Digital rectal exam was performed and     revealed no abnormalities.   The [id number] ([id number]) endoscope was     introduced through the anus and advanced to the cecum, which was     identified by the ileocecal valve, without limitations.  The     quality of the prep was excellent, using MiraLax.  The instrument     was then slowly withdrawn as the colon was fully examined.     <<PROCEDUREIMAGES>>           FINDINGS:  A sessile polyp was found in the descending colon. It     was 5 mm in size. Nonbleeding polyp Polyp was snared without     cautery. Retrieval was successful (see image004). snare polyp     Diverticula were found in the sigmoid colon (see image007). Few     diverticula  This was otherwise a normal examination of the colon     (see image001, image002, image003, image006, image008, and     image009).   Retroflexed views in the rectum revealed no     abnormalities.    The time to cecum =  minutes. The scope was then     withdrawn (time =  min) from the patient and the procedure     completed.        COMPLICATIONS:  None           ENDOSCOPIC IMPRESSION:     1) 5 mm sessile polyp in the descending colon     2) Diverticula in the sigmoid colon     3) Otherwise normal examination     RECOMMENDATIONS:If polyp is precancerous, followup colonoscopy 5     years, otherwise followup in 10 years           REPEAT EXAM:   You will receive a letter from Dr. Arlyce Dice in 1-2     weeks, after reviewing the final pathology, with followup     recommendations.           ______________________________     Barbette Hair Arlyce Dice, MD  CC:           n.     eSIGNED:   Barbette Hair. Dontavia Brand at 05/28/2010 03:47 PM           Otho Darner, [id number]  Note: An exclamation mark (!) indicates a result that was not dispersed into the flowsheet. Document Creation Date: 05/28/2010 3:48 PM _______________________________________________________________________  (1) Order result status: Final Collection or observation date-time: 05/28/2010 15:37 Requested date-time:  Receipt date-time:  Reported date-time:  Referring Physician:   Ordering Physician: Melvia Heaps [phone]) Specimen Source:  Source: Launa Grill Order Number: [id number] Lab site:   Appended Document: Colonoscopy Recall is in IDX for 05/2020.

## 2011-01-25 NOTE — Procedures (Signed)
Summary: Upper Endoscopy  Patient: William [name] Note: All result statuses are Final unless otherwise noted.  Tests: (1) Upper Endoscopy (EGD)   EGD Upper Endoscopy       DONE     [facility]     [address]     [place], [place]  [address]           ENDOSCOPY PROCEDURE REPORT           PATIENT:  William, [name]  MR#:  [id number]     BIRTHDATE:  25-Aug-1961, 49 yrs. old  GENDER:  male           ENDOSCOPIST:  William Hair. Arlyce Dice, MD     Referred by:           PROCEDURE DATE:  05/26/2010     PROCEDURE:  EGD, diagnostic     ASA CLASS:  Class III     INDICATIONS:  anemia           MEDICATIONS:   Fentanyl 50 mcg IV, Versed 5 mg IV, glycopyrrolate     (Robinal) 0.2 mg IV     TOPICAL ANESTHETIC:  Cetacaine Spray           DESCRIPTION OF PROCEDURE:   After the risks benefits and     alternatives of the procedure were thoroughly explained, informed     consent was obtained.  The [id number] ([id number]) endoscope was     introduced through the mouth and advanced to the third portion of     the duodenum, without limitations.  The instrument was slowly     withdrawn as the mucosa was fully examined.     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  Otherwise the examination was normal (see     image002, image003, image004, image005, and image006).     Retroflexed views revealed no abnormalities.    The scope was then     withdrawn from the patient and the procedure completed.           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 Arlyce Dice, MD           CC:  William Albino, MD           n.     Rosalie DoctorBarbette Hair. [name] at 05/26/2010 02:13 PM           William [name],  [id number]  Note: An exclamation mark (!) indicates a result that was not dispersed into the flowsheet. Document Creation Date: 05/26/2010 2:14 PM _______________________________________________________________________  (1) Order result status: Final Collection or observation date-time: 05/26/2010 14:06 Requested date-time:  Receipt date-time:  Reported date-time:  Referring Physician:   Ordering Physician: William [name] [phone]) Specimen Source:  Source: William [name] Order Number: [id number] Lab site:

## 2011-01-25 NOTE — Miscellaneous (Signed)
Summary: SCAT FORM  Pt dropped off form to be filled out for SCAT. Pt needs it ASAP, pls call patient when form is ready. William [name]  November 23, 2010 9:39 AM  Form recieved, will try and provide documentation for him.  Appended Document: SCAT FORM Patient informed that information was ready to be picked up.

## 2011-01-25 NOTE — Consult Note (Signed)
Summary: [name]  [facility]   Imported By: De Nurse 10/13/2010 15:25:15  _____________________________________________________________________  External Attachment:    Type:   Image     Comment:   External Document

## 2011-01-25 NOTE — Progress Notes (Signed)
Summary: Rx Prob  Phone Note Call from Patient Call back at Home Phone [phone]   Caller: Patient Summary of Call: Pt has questions about the medications he is taking.   Initial call taken by: Clydell Hakim,  September 13, 2010 4:38 PM  Follow-up for Phone Call        patient was calling about pantoprazole. advised him that on 08/26/2010 this was changed to nexium and it was sent to pharmacy electronically. explained to him that medicaid was requesting this change in order for med to be covered.  he voices understanding. Follow-up by: Theresia Lo RN,  September 13, 2010 4:52 PM

## 2011-01-25 NOTE — Progress Notes (Signed)
Summary: refill  Phone Note Refill Request Call back at Home Phone [phone] Message from:  Patient  needs refill Hydroxyzine - takes 4xdaily - needs for at least 1 month  - [facility] [address] [facility]  pt is now out   Initial call taken by: De Nurse,  November 11, 2010 2:40 PM    prescriptions were refilled 09/29/2010 and pharmacy has the RX. patient notified. Theresia Lo RN  November 11, 2010 3:09 PM

## 2011-01-25 NOTE — Assessment & Plan Note (Signed)
Summary: f/u eo   Vital Signs:  Patient profile:   50 year old male Weight:      196.6 pounds Temp:     98.4 degrees F Pulse rate:   88 / minute BP sitting:   144 / 84  (right arm)  Vitals Entered By: Starleen Blue RN (September 29, 2010 2:49 PM)  Serial Vital Signs/Assessments:  Time      Position  BP       Pulse  Resp  Temp     By 3:34 PM             142/88                         Alvia Grove DO  CC: f/u Is Patient Diabetic? No Pain Assessment Patient in pain? no        Primary Care Trev Boley:  Alvia Grove DO  CC:  f/u.  History of Present Illness: 50 yo male here for f/u multiple medical issues: 1.Anemia: last Hgb was 9.5, improved since hospitalization. Improved overall energy.  Taking all meds as directed. Has started exercising to increase energy level.  2. 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  Has not had a drink since 02/2010.  Continues to abstain.  Not interested in f/u with GI at this point, but willing to if the need arises.  Still not interested in attending AA.  Endorses good family and social support as well as multiple accountability partners.  3. Hepatic encephalopathy:  Continues on Lactulose.  No mental status changes.   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 Take 1 Pill By Mouth Daily 2)  Folic Acid 1 Mg Tabs (Folic Acid) .... Take 1 Pill By Mouth Daily 3)  Hydroxyzine Hcl 10 Mg Tabs (Hydroxyzine Hcl) .... Take 1 Pill By Mouth As Needed Itching 4)  Vitamin B-1 100 Mg Tabs (Thiamine Hcl) .... Take 1 Pill By Mouth Daily 5)  Nexium 40 Mg Pack (Esomeprazole Magnesium) .... Take 1 Pill Po Daily 6)  Generlac 10 Gm/26ml Soln (Lactulose Encephalopathy) .... Take 45 Ml (30grams) By Mouth Bid 7)  Nadolol 20 Mg Tabs (Nadolol) .... Take 1 Pill By Mouth Daily 8)  Sodium Bicarbonate 650 Mg Tabs (Sodium Bicarbonate) .Marland Kitchen.. 1 Pill By Mouth Two Times A Day 9)  Tramadol Hcl 50 Mg Tabs (Tramadol Hcl) .... Take 1 Pill By Mouth Qid As Needed Pain 10)  Calcium 600 1500 Mg Tabs (Calcium Carbonate) .... Take 1 Pill By Mouth Daily 11)  [facility] Iron 325 (65 Fe) Mg Tabs (Ferrous Sulfate) .... Take 1 Pill Po Daily  Allergies (verified): No Known Drug Allergies  Past History:  Past Medical History: Last updated: 07/29/2010 anemia Cirrhosis  Blood transfusion hepatic encephalopathy  Family History: Last updated: 07/29/2010 unknown  Social History: Last updated: 09/29/2010 Single Divorced Heavy EtoH use in the past, last drink was March 2011 Lives in [place] alone, has 1 daughter who is getting married in Feb 2012 good family support Drug use-no  Risk Factors: Alcohol Use: 0 (09/29/2010) >5 drinks/d w/in last 3 months: no (09/29/2010) Exercise: yes (09/29/2010)  Risk Factors: Smoking Status: never (09/29/2010)  Social History: Reviewed history from 07/29/2010 and no changes required. Single Divorced Heavy EtoH use in the past, last drink was March 2011 Lives in [place] alone, has 1 daughter who is getting married in Feb 2012 good family support Drug use-no Does Patient Exercise:  yes  Review of Systems  The patient denies anorexia, fever, weight loss, weight gain, vision loss, decreased hearing, hoarseness, chest pain, syncope, dyspnea on  exertion, peripheral edema, prolonged cough, headaches, hemoptysis, abdominal pain, melena, hematochezia, severe indigestion/heartburn, hematuria, incontinence, genital sores, muscle weakness, suspicious skin lesions, transient blindness, difficulty walking, depression, unusual weight change, abnormal bleeding, enlarged lymph nodes, angioedema, breast masses, and testicular masses.    Physical Exam  General:  VS reviewed, hypertensive, alert, well-developed, well-nourished, and well-hydrated.   Lungs:  Normal respiratory effort, chest expands symmetrically. Lungs are clear to auscultation, no crackles or wheezes. Heart:  Normal rate and regular rhythm. S1 and S2 normal without gallop, murmur, click, rub or other extra sounds. Abdomen:  hepatomegally, non-tender, normal bowel sounds, no rebound tenderness, and no splenomegaly.   Extremities:  No clubbing, cyanosis, edema, or deformity noted with normal full range of motion of all joints.   Neurologic:  alert & oriented X3, cranial nerves II-XII intact, and strength normal in all extremities.   Skin:  turgor normal, color normal, and no rashes.   Psych:  Oriented X3, good eye contact, and not anxious appearing.     Impression & Recommendations:  Problem # 1:  ELEVATED BP READING WITHOUT DX HYPERTENSION (ICD-796.2) Has been out of lasix and BBlocker for 4 days. refilled meds today See pt instructions His updated medication list for this problem includes:    Furosemide 20 Mg Tabs (Furosemide) .Marland Kitchen... Take 1 pill by mouth daily    Nadolol 20 Mg Tabs (Nadolol) .Marland Kitchen... Take 1 pill by mouth daily  Orders: [facility]- Est  Level 4 ([address])  Problem # 2:  ANEMIA, HEMOLYTIC, NON-AUTOIMMUNE (ICD-283.19) stable.  Repeat CBC at next visit His updated medication list for this problem includes:    Folic Acid 1 Mg Tabs (Folic acid) .Marland Kitchen... Take 1 pill by mouth daily    [facility] Iron 325 (65 Fe) Mg Tabs (Ferrous sulfate) .Marland Kitchen... Take 1 pill po daily  Problem # 3:   ALCOHOLIC CIRRHOSIS OF LIVER (ICD-571.2) Assessment: Improved CMP improved from last hospitalization.  Doing well considering how sick he intially was.  Continues to abstain from EToH. Orders: [facility]- Est  Level 4 (98119)  Problem # 4:  COMPRESSION FRACTURE, LUMBAR VERTEBRAE (ICD-805.4) Assessment: Improved Pain free  Complete Medication List: 1)  Furosemide 20 Mg Tabs (Furosemide) .... Take 1 pill by mouth daily 2)  Folic Acid 1 Mg Tabs (Folic acid) .... Take 1 pill by mouth daily 3)  Hydroxyzine Hcl 10 Mg Tabs (Hydroxyzine hcl) .... Take 1 pill by mouth as needed itching 4)  Vitamin B-1 100 Mg Tabs (Thiamine hcl) .... Take 1 pill by mouth daily 5)  Nexium 40 Mg Pack (Esomeprazole magnesium) .... Take 1 pill po daily 6)  Generlac 10 Gm/16ml Soln (  Lactulose encephalopathy) .... Take 45 ml (30grams) by mouth bid 7)  Nadolol 20 Mg Tabs (Nadolol) .... Take 1 pill by mouth daily 8)  Sodium Bicarbonate 650 Mg Tabs (Sodium bicarbonate) .Marland Kitchen.. 1 pill by mouth two times a day 9)  Tramadol Hcl 50 Mg Tabs (Tramadol hcl) .... Take 1 pill by mouth qid as needed pain 10)  Calcium 600 1500 Mg Tabs (Calcium carbonate) .... Take 1 pill by mouth daily 11)  Cvs Iron 325 (65 Fe) Mg Tabs (Ferrous sulfate) .... Take 1 pill po daily  Patient Instructions: 1)  Great to see you today! 2)  [facility] and write it down and bring them at your next appt.   3)  I have refilled all of your medicines for you, [facility]  Make sure you take all of them daily.  4)  If any of your meds are not covered by your insurancer, call me and I can change them. 5)  Good luck in court, please let me know what happens. 6)  Please schedule a follow-up appointment in 2 months.  Prescriptions: [facility] IRON 325 (65 FE) MG TABS (FERROUS SULFATE) take 1 pill Po daily  #30 x 5   Entered and Authorized by:   Alvia Grove DO   Signed by:   Alvia Grove DO on 09/29/2010   Method used:   [facility]         [facility]  [address] [id number]       [address]       [place], [place]  [address]       Ph: [phone] or [phone]       Fax: [phone]   RxID:   [id number] CALCIUM 600 1500 MG TABS (CALCIUM CARBONATE) take 1 pill by mouth daily  #30 x 5   Entered and Authorized by:   Alvia Grove DO   Signed by:   Alvia Grove DO on 09/29/2010   Method used:   [facility]ically [facility]        [facility]  [address] [id number] ([facility]ail)       [address]       [place], [place]  [address]       Ph: [phone] or [phone]       Fax: [phone]   RxID:   [id number] TRAMADOL HCL 50 MG TABS (TRAMADOL HCL) take 1 pill by mouth QID as needed pain  #601 x 1   Entered and Authorized by:   Alvia Grove DO   Signed by:   Alvia Grove DO on 09/29/2010   Method used:   [facility]ically [facility]        [facility]  [address] [id number] ([facility]ail)       [address]       [place], [place]  [address]       Ph: [phone] or [phone]       Fax: [phone]   RxID:   [id number] SODIUM BICARBONATE 650 MG TABS (SODIUM BICARBONATE) 1 pill by mouth two times a day  #60 x 5   Entered and Authorized by:   Alvia Grove DO   Signed by:   Alvia Grove DO on 09/29/2010   Method used:   [facility]ically [facility]        [facility]  [address] [id number] (retail)  [address]       [place], [place]  [address]       Ph: [phone] or [phone]       Fax: [phone]   RxID:   [id number] NADOLOL 20 MG TABS (NADOLOL) take 1 pill by mouth daily  #30 x 5   Entered and Authorized by:   Alvia Grove DO   Signed by:   Alvia Grove DO on 09/29/2010   Method used:   [facility]ically [facility]        [facility]  [address] [id number] ([facility]ail)       [address]       [place], [place]  [address]       Ph: [phone] or [phone]       Fax: [phone]   RxID:   [id number] GENERLAC 10 GM/15ML SOLN (LACTULOSE  ENCEPHALOPATHY) take 45 mL (30grams) by mouth BID  #3L x 5   Entered and Authorized by:   Alvia Grove DO   Signed by:   Alvia Grove DO on 09/29/2010   Method used:   [facility]ically [facility]        [facility]  [address] [id number] ([facility]ail)       [address]       [place], [place]  [address]       Ph: [phone] or [phone]       Fax: [phone]   RxID:   [id number] NEXIUM 40 MG PACK (ESOMEPRAZOLE MAGNESIUM) take 1 pill Po daily  #30 x 5   Entered and Authorized by:   Alvia Grove DO   Signed by:   Alvia Grove DO on 09/29/2010   Method used:   [facility]ronically to        [facility]  [address] [id number] ([facility]ail)       [address]       [place], [place]  [address]       Ph: [phone] or [phone]       Fax: [phone]   RxID:   [id number] VITAMIN B-1 100 MG TABS (THIAMINE HCL) take 1 pill by mouth daily  #30 x 5   Entered and Authorized by:   Alvia Grove DO   Signed by:   Alvia Grove DO on 09/29/2010   Method used:   [facility]ronically to        [facility]  [address] [id number] ([facility]ail)       [address]       [place], [place]  [address]       Ph: [phone] or [phone]       Fax: [phone]   RxID:   [id number] HYDROXYZINE HCL 10 MG TABS (HYDROXYZINE HCL) take 1 pill by mouth as needed itching  #60 x 5   Entered and Authorized by:   Alvia Grove DO   Signed by:   Alvia Grove DO on 09/29/2010   Method used:   [facility]ically to        [facility]  [address] [id number] ([facility]ail)       [address]       [place], [place]  [address]       Ph: [phone] or [phone]       Fax: [phone]   RxID:   [id number] FOLIC  ACID 1 MG TABS (FOLIC ACID) take 1 pill by mouth daily  #30 x 5   Entered and Authorized by:   Alvia Grove DO   Signed by:   Alvia Grove DO on 09/29/2010   Method used:   [facility]ronically to        [facility]  [address] [id number] ([facility]ail)       [address]        [place], [place]  [address]       Ph: [phone] or [phone]       Fax: [phone]   RxID:   [id number] FUROSEMIDE 20 MG TABS (FUROSEMIDE) take 1 pill by mouth daily  #30 x 5   Entered and Authorized by:   Alvia Grove DO   Signed by:   Alvia Grove DO on 09/29/2010   Method used:   Electronically to        [facility]  [address] [id number] ([facility])       [address]       [place], [place]  [address]       Ph: [phone] or [phone]       Fax: [phone]   RxID:   [id number]   Appended Document: f/u eo    Clinical Lists Changes  Medications: Added new medication of HYDROXYZINE HCL 25 MG TABS (HYDROXYZINE HCL) SIG: Take 1 tab by mouth every 6 hours as needed itch - Signed Rx of HYDROXYZINE HCL 25 MG TABS (HYDROXYZINE HCL) SIG: Take 1 tab by mouth every 6 hours as needed itch;  #60 x 0;  Signed;  Entered by: Paula Compton MD;  Authorized by: Paula Compton MD;  Method used: [facility]  [address] [id number] [address] [place], [place], [place]  [address], Ph: [phone] or [phone], Fax: [phone]    Prescriptions: HYDROXYZINE HCL 25 MG TABS (HYDROXYZINE HCL) SIG: Take 1 tab by mouth every 6 hours as needed itch  #60 x 0   Entered and Authorized by:   Paula Compton MD   Signed by:   Paula Compton MD on 11/11/2010   Method used:   Electronically to        [facility]  [address] [id number] ([facility]ail)       [address]       [place], [place]  [address]       Ph: [phone] or [phone]       Fax: [phone]   RxID:   [id number]   Asked to resend prescription of hydroxyzine for itch.  Sent electronically.  Paula Compton MD  November 11, 2010 4:09 PM

## 2011-01-25 NOTE — Miscellaneous (Signed)
  Clinical Lists Changes  Received prior auth to switch patient from Protonix to Nexium.  However, this was already completed through nursing staff.  Will continue Nexium. Renold Don MD  August 26, 2010 4:55 PM

## 2011-01-25 NOTE — Progress Notes (Signed)
Summary: phn msg  Phone Note Call from Patient Call back at [phone]   Caller: daughter-Shamia Summary of Call: wants to know results of test done at [facility] - specifically bloodwork Initial call taken by: De Nurse,  August 06, 2010 3:08 PM  Follow-up for Phone Call        advise daughter to call Dr. Haywood Pao office to get that report. Follow-up by: Theresia Lo RN,  August 06, 2010 3:26 PM

## 2011-01-25 NOTE — Progress Notes (Signed)
Summary: Req paperwork  Phone Note Call from Patient Call back at Home Phone [phone]   Reason for Call: Talk to Nurse Summary of Call: wants to know if MD can sign papers for pt to get set up with [facility] without an appt, pt is asking this to be done before he goes to court on  wed. pt has an appt with Amamda Curbow on 12/12.  Initial call taken by: [name] Royalty,  November 22, 2010 10:08 AM    11-22-10 1444 Sure, please have him send me the paperwork that needs to be filled out.  William [name]  Appended Document: Req paperwork Patient will bring paperwork tomm.

## 2011-01-25 NOTE — Miscellaneous (Signed)
Summary: medication update  Clinical Lists Changes  Medications: Changed medication from PANTOPRAZOLE SODIUM 40 MG TBEC (PANTOPRAZOLE SODIUM) 1 pill by mouth daily to NEXIUM 40 MG PACK (ESOMEPRAZOLE MAGNESIUM) take 1 pill by mouth daily - Signed Rx of NEXIUM 40 MG PACK (ESOMEPRAZOLE MAGNESIUM) take 1 pill by mouth daily;  #30 x 5;  Signed;  Entered by: Alvia Grove DO;  Authorized by: Alvia Grove DO;  Method used: [facility]  [address] [id number] [address], [place], [place]  [address], Ph: [phone] or [phone], Fax: [phone] Observations: Added new observation of MEDRECON: current updated (08/26/2010 13:04)    Prescriptions: NEXIUM 40 MG PACK (ESOMEPRAZOLE MAGNESIUM) take 1 pill by mouth daily  #30 x 5   Entered and Authorized by:   Alvia Grove DO   Signed by:   Alvia Grove DO on 08/26/2010   Method used:   [facility]ronically to        [facility]  [address] [id number] ([facility]ail)       [address]       [place], [place]  [address]       Ph: [phone] or [phone]       Fax: [phone]   RxID:   [id number]    Current Medications (verified): 1)  Folic Acid 1 Mg Tabs (Folic Acid) .Marland Kitchen.. 1 Pill By Mouth Daily 2)  Hydroxyzine Hcl 10 Mg Tabs (Hydroxyzine Hcl) .Marland Kitchen.. 1 Pill By Mouth Q 6hrs Prn Itching 3)  Vitamin B-1 100 Mg Tabs (Thiamine Hcl) .Marland Kitchen.. 1 Pill By Mouth Daily 4)  Nexium 40 Mg Pack (Esomeprazole Magnesium) .... Take 1 Pill By Mouth Daily 5)  Generlac 10 Gm/56ml Soln (Lactulose Encephalopathy) .... Take 45ml (30 Gm) By Mouth Before Meals and Qhs 6)  Nadolol 20 Mg Tabs (Nadolol) .Marland Kitchen.. 1 Pill By Mouth Daily 7)  Fortical 200 Unit/act Soln (Calcitonin ([place])) .Marland Kitchen.. 1 Spray in Alternating Nostrils Every Day 8)  Sodium Bicarbonate 650 Mg Tabs (Sodium Bicarbonate) .Marland Kitchen.. 1 Tab Two Times A Day With Meals 9)  Tramadol Hcl 50 Mg Tabs (Tramadol Hcl) .Marland Kitchen.. 1 Pill By Mouth Q6hrs As Needed Pain 10)  Furosemide 20 Mg Tabs (Furosemide)  .... Take 1 Pill By Mouth Daily 11)  Calcium 600 1500 Mg Tabs (Calcium Carbonate) .... Take 1 Pill By Mouth Daily 12)  [facility]vs Iron 325 (65 Fe) Mg Tabs (Ferrous Sulfate) .Marland Kitchen.. 1 Paill By Mouth Daily 13)  Ibuprofen 600 Mg Tabs (Ibuprofen) .... Take 1 Pill By Mouth Q12 Hours As Needed Pain  Allergies: No Known Drug Allergies

## 2011-01-25 NOTE — Consult Note (Signed)
Summary: [facility]  [facility]   Imported By: Clydell Hakim 07/23/2010 14:35:01  _____________________________________________________________________  External Attachment:    Type:   Image     Comment:   External Document

## 2011-01-25 NOTE — Assessment & Plan Note (Signed)
Summary: F/U VISIT/[facility]   Vital Signs:  Patient profile:   50 year old male Weight:      193.7 pounds Temp:     98.7 degrees F oral Pulse rate:   70 / minute BP sitting:   115 / 72  (right arm) Cuff size:   regular  Vitals Entered By: Loralee Pacas CMA (August 31, 2010 10:16 AM) CC: fu Is Patient Diabetic? No Pain Assessment Patient in pain? no        Primary Care Provider:  Alvia Grove DO  CC:  fu.  History of Present Illness: 50 yo male here for f/u multiple medical issues: 1.Hx of anemia: reports low energy today. This is not new for him, but it is not improving as quickly as he thought it would be.  Taking all meds as directed.  2. 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  Has not had a drink since 02/2010.  Continues to abstain.  Not interested in f/u with GI at this point.  But willing to if the need arises.  Still not interested in attending AA.  Endorses good family and social support as well as multiple accountability partners.  3. Hepatic encephalopathy: Decreased bicarb per last OV.  Continues on Lactulose.  No mental status changes.   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) 6)  Hepatic Failure, End Stage  (ICD-570) 7)  Alcoholic Cirrhosis of Liver  (ZOX-096.2)  Allergies (verified): No Known Drug Allergies  Past History:  Past Medical History: Last updated: 07/29/2010 anemia Cirrhosis Blood transfusion hepatic encephalopathy  Family History: Last updated: 07/29/2010 unknown  Social History: Last updated:  07/29/2010 Single Divorced Heavy EtoH use in the past, last drink was March 2011 Lives in [place] alone, has 1 daughter good family support Drug use-no  Risk Factors: Alcohol Use: 0 (08/31/2010) >5 drinks/d w/in last 3 months: no (08/31/2010)  Risk Factors: Smoking Status: never (08/31/2010)  Social History: Reviewed history from 07/29/2010 and no changes required. Single Divorced Heavy EtoH use in the past, last drink was March 2011 Lives in [place] alone, has 1 daughter good family support Drug use-no  Review of Systems  The patient denies anorexia, fever, weight loss, weight gain, vision loss, decreased hearing, hoarseness, chest pain, syncope, dyspnea on exertion, peripheral edema, prolonged cough, headaches, hemoptysis, abdominal pain, melena, hematochezia, severe indigestion/heartburn, hematuria, incontinence, genital sores, muscle weakness, suspicious skin lesions, transient blindness, difficulty walking, depression, unusual weight change, abnormal bleeding, enlarged lymph nodes, angioedema, breast masses, and testicular masses.    Physical Exam  General:  Vs reviewed, alert, well-nourished, and well-hydrated.   Head:  Normocephalic and atraumatic without obvious abnormalities. No apparent alopecia or balding. Eyes:  scleral icterus (much improved since hospitalization)vision grossly intact, pupils equal, pupils round, pupils reactive to light, and scleral icterus.   Lungs:  Normal respiratory effort, chest expands symmetrically. Lungs are clear to auscultation, no crackles or wheezes. Heart:  Normal rate and regular rhythm. S1 and S2 normal without gallop, murmur, click, rub or other extra sounds. Abdomen:  hepatomegally, non-tender, normal bowel sounds, no rebound tenderness, and no splenomegaly.   Extremities:  No clubbing, cyanosis, edema, or deformity noted with normal full range of motion of all joints.   Neurologic:  alert & oriented X3, cranial nerves II-XII intact,  strength normal in all extremities, sensation intact to light touch, sensation intact to pinprick, and gait normal.   Skin:  turgor normal, color normal, and no rashes.   Psych:  normally interactive, good eye contact, not anxious appearing, and not depressed appearing.   1.)Folic Acid 1 Mg Tabs (Folic Acid) .Marland Kitchen.. 1 Pill By Mouth Daily 2)  Hydroxyzine Hcl 10 Mg Tabs (Hydroxyzine Hcl) .Marland Kitchen.. 1 Pill By Mouth Q 6hrs Prn Itching 3)  Vitamin B-1 100 Mg Tabs (Thiamine Hcl) .Marland Kitchen.. 1 Pill By Mouth Daily 4)  Nexium .Marland Kitchen.. 1 Pill By Mouth Daily 5)  Generlac 10 Gm/65ml Soln (Lactulose Encephalopathy) .... Take 45ml (30 Gm) By Mouth Before Meals and Qhs 6)  Nadolol 20 Mg Tabs (Nadolol) .Marland Kitchen.. 1 Pill By Mouth Daily 7)  Fortical 200 Unit/act Soln (Calcitonin ([place])) .Marland Kitchen.. 1 Spray in Alternating Nostrils Every Day 8)  Sodium Bicarbonate 650 Mg Tabs (Sodium Bicarbonate) .Marland Kitchen.. 1 Tab Two Times A Day With Meals 9)  Tramadol Hcl 50 Mg Tabs (Tramadol Hcl) .Marland Kitchen.. 1 Pill By Mouth Q6hrs As Needed Pain 10)  Furosemide 20 Mg Tabs (Furosemide) .... Take 1 Pill By Mouth Daily 11)  Calcium 600 1500 Mg Tabs (Calcium Carbonate) .... Take 1 Pill By Mouth Daily 12)  Cvs Iron 325 (65 Fe) Mg Tabs (Ferrous Sulfate) .Marland Kitchen.. 1 Paill By Mouth Daily 13)  Ibuprofen 600 Mg Tabs (Ibuprofen) .... Take 1 Pill By Mouth Q12 Hours As Needed Pain    Impression & Recommendations:  Problem # 1:  HEPATIC FAILURE, END STAGE (ICD-570)  continues to improve with all cessation from alcholol.  Will recheck labs today.  Continue current meds.  Orders: [facility]- Est  Level 4 (40981)  Problem # 2:  COMPRESSION FRACTURE, LUMBAR VERTEBRAE (ICD-805.4)  repeat xray showed improvement.  Pt has no pain.   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 Take  1 pill by mouth daily 2)  Ibuprofen 600 Mg Tabs (Ibuprofen) .... Take 1 pill by mouth q12 hours as needed pain  Other Orders: Comp Met-[facility] ([id number])  Patient Instructions: 1)  Nice to see you today 2)  I will check your labs today and call you if anything as abnormal 3)  Follow up in 4 weeks 4)  Call me after your court date, I hope everything goes well.

## 2011-01-27 NOTE — Miscellaneous (Signed)
Summary: PA required for Nexium  Clinical Lists Changes PA request received for Nexium. form placed in MD box. Theresia Lo RN  December 06, 2010 4:44 PM  Medications: Changed medication from NEXIUM 40 MG PACK (ESOMEPRAZOLE MAGNESIUM) 1 pill by mouth daily to OMEPRAZOLE 40 MG CPDR (OMEPRAZOLE) 1 pill Po daily - Signed Rx of OMEPRAZOLE 40 MG CPDR (OMEPRAZOLE) 1 pill Po daily;  #30 x 5;  Signed;  Entered by: Alvia Grove DO;  Authorized by: Alvia Grove DO;  Method used: [facility]  [address] [id number] [address], [place], [place]  [address], Ph: [phone] or [phone], Fax: [phone]  Switched to omeprazole, is covered with insurance  Prescriptions: OMEPRAZOLE 40 MG CPDR (OMEPRAZOLE) 1 pill Po daily  #30 x 5   Entered and Authorized by:   Alvia Grove DO   Signed by:   Alvia Grove DO on 12/08/2010   Method used:   [facility]        [facility]  [address] [id number]       [address]       [place], [place]  [address]       Ph: [phone] or [phone]       Fax: [phone]   RxID:   [id number]

## 2011-01-27 NOTE — Assessment & Plan Note (Signed)
Summary: f/u,df   Vital Signs:  Patient profile:   50 year old male Weight:      202.56 pounds Temp:     98.1 degrees F Pulse rate:   65 / minute BP sitting:   165 / 97  Vitals Entered By: Jone Baseman CMA (December 06, 2010 2:37 PM) CC: f/u Is Patient Diabetic? No Pain Assessment Patient in pain? yes     Location: joints and feet Intensity: 6   Primary Care Provider:  Alvia Grove DO  CC:  f/u.  History of Present Illness: 50 yo male here for f/u multiple medical issues: 1.Anemia: last Hgb was 9.5, improved since hospitalization. Improved overall energy.  Taking all meds as directed. Has started exercising to increase energy level.  2. 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  Has not had a drink since 02/2010.  Continues to abstain.  Not interested in f/u with GI at this point, but willing to if the need arises.  Still not interested in attending AA.  Endorses good family and social support as well as multiple accountability partners.  3. Hepatic encephalopathy:  Continues on Lactulose.  No mental status changes.   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 Sig: Take 1 Tab By Mouth Every 6 Hours As Needed Itch 2)  Lasix 20 Mg Tabs (Furosemide) .Marland Kitchen.. 1 Pill Daily 3)  Folic Acid 1 Mg Tabs (Folic Acid) .Marland Kitchen.. 1 Pill Daily 4)   Vitamin B-1 100 Mg Tabs (Thiamine Hcl) .Marland Kitchen.. 1 Pill Po Daily 5)  Nexium 40 Mg Pack (Esomeprazole Magnesium) .Marland Kitchen.. 1 Pill By Mouth Daily 6)  Generlac 10 Gm/77ml Soln (Lactulose Encephalopathy) .... Take 45ml By Mouth Two Times A Day 7)  Nadolol 20 Mg Tabs (Nadolol) .Marland Kitchen.. 1 Pill By Mouth Daily 8)  Sodium Bicarbonate 650 Mg Tabs (Sodium Bicarbonate) .Marland Kitchen.. 1 Pill By Mouth Two Times A Day 9)  Tramadol Hcl 50 Mg Tabs (Tramadol Hcl) .Marland Kitchen.. 1 Pill By Mouth Three Times A Day As Needed Pain 10)  Calcium 600 1500 Mg Tabs (Calcium Carbonate) .Marland Kitchen.. 1 Pill By Mouth Daily 11)  [facility] Iron 325 (65 Fe) Mg Tabs (Ferrous Sulfate) .Marland Kitchen.. 1 Pill By Mouth Daily  Allergies (verified): No Known Drug Allergies  Past History:  Past Medical History: Last updated: 07/29/2010 anemia Cirrhosis Blood transfusion hepatic encephalopathy  Family History: Last updated: 07/29/2010 unknown  Social History: Last updated: 09/29/2010 Single Divorced Heavy EtoH use in the past, last drink was March 2011 Lives in [place] alone, has 1 daughter who is getting married in Feb 2012 good family support Drug use-no  Risk Factors: Alcohol Use: 0 (12/06/2010) >5 drinks/d w/in last 3 months: no (12/06/2010) Exercise: yes (09/29/2010)  Risk Factors: Smoking Status: never (12/06/2010)  Review of Systems       see hpi  Physical Exam  General:  VS reviewed, hypertensive, alert, well-developed, well-nourished, and well-hydrated.   Lungs:  Normal respiratory effort, chest expands symmetrically. Lungs are clear to auscultation, no crackles or wheezes. Heart:  Normal rate and regular rhythm. S1 and S2 normal without gallop, murmur, click, rub or other extra sounds. Abdomen:  hepatomegally, non-tender, normal bowel sounds, no rebound tenderness, and no splenomegaly.   Extremities:  No clubbing, cyanosis, edema, or deformity noted with normal full range of motion of all joints.     Impression & Recommendations:  Problem # 1:  ANEMIA,  HEMOLYTIC, NON-AUTOIMMUNE (ICD-283.19) recheck labs today  His updated medication list for this problem includes:    Folic Acid 1 Mg Tabs (Folic acid) .Marland Kitchen... 1 pill daily    [facility] Iron 325 (65 Fe) Mg Tabs (Ferrous sulfate) .Marland Kitchen... 1 pill by mouth daily  Orders: CBC w/Diff-[facility] (62130) [facility]- Est Level  3 (86578)  Problem # 2:  HEPATIC ENCEPHALOPATHY (ICD-572.2) recheck labs today  Problem # 3:  HEPATIC FAILURE, END STAGE (ICD-570) cmp today see pt instructions Orders: Comp Met-[facility] (46962-95284)  Complete Medication List: 1)  Hydroxyzine Hcl 25 Mg Tabs (Hydroxyzine hcl) .... Sig: take 1 tab by mouth every 6 hours as needed itch 2)  Lasix 20 Mg Tabs (Furosemide) .Marland Kitchen.. 1 pill daily 3)  Folic Acid 1 Mg Tabs (Folic acid) .Marland Kitchen.. 1 pill daily 4)  Vitamin B-1 100 Mg Tabs (Thiamine hcl) .Marland Kitchen.. 1 pill po daily 5)  Nexium 40 Mg Pack (Esomeprazole magnesium) .Marland Kitchen.. 1 pill by mouth daily 6)  Generlac 10 Gm/106ml Soln (Lactulose encephalopathy) .... Take 45ml by mouth two times a day 7)  Nadolol 20 Mg Tabs (Nadolol) .Marland Kitchen.. 1 pill by mouth daily 8)  Sodium Bicarbonate 650 Mg Tabs (Sodium bicarbonate) .Marland Kitchen.. 1 pill by mouth two times a day 9)  Tramadol Hcl 50 Mg Tabs (Tramadol hcl) .Marland Kitchen.. 1 pill by mouth three times a day as needed pain 10)  Calcium 600 1500 Mg Tabs (Calcium carbonate) .Marland Kitchen.. 1 pill by mouth daily 11)  [facility] Iron 325 (65 Fe) Mg Tabs (Ferrous sulfate) .Marland Kitchen.. 1 pill by mouth daily  Patient Instructions: 1)  Make sure you get your labs today. 2)  Call your insurance company and see if they will accept my labs. 3)  It was good to see you today, keep up the good work. 4)  I am going to refill your medicines today and [facility] [address] [facility].  Prescriptions: [facility] IRON 325 (65 FE) MG TABS (FERROUS SULFATE) 1 pill by mouth daily  #30 x 5   Entered and Authorized by:   Alvia Grove DO   Signed by:   Alvia Grove DO on 12/06/2010   Method used:   [facility]        [facility]   [address] [id number]       [address]       [place], [place]  [address]       Ph: [phone] or [phone]       Fax: [phone]   RxID:   [id number] CALCIUM 600 1500 MG TABS (CALCIUM CARBONATE) 1 pill by mouth daily  #30 x 5   Entered and Authorized by:   Alvia Grove DO   Signed by:   Alvia Grove DO on 12/06/2010   Method used:   [facility]ically [facility]        [facility]  [address] [id number]       [address]       [place], [place]  [address]       Ph: [phone] or [phone]       Fax: [phone]   RxID:   [id number] TRAMADOL HCL 50 MG TABS (TRAMADOL HCL) 1 pill by mouth three times a day as needed Pain  #90 x 3   Entered and Authorized by:   Alvia Grove DO   Signed by:   Alvia Grove DO on 12/06/2010   Method used:   [facility]ically [facility]        [facility]  [address] [id number]       [address]       [place], [place]  [address]       Ph: [phone] or [phone]       Fax: [phone]   RxID:   [id number] SODIUM BICARBONATE 650 MG TABS (SODIUM BICARBONATE) 1 pill by mouth two times a day  #60 x 5   Entered and Authorized by:   Alvia Grove DO   Signed by:   Alvia Grove DO on 12/06/2010   Method used:   [facility]        [facility]  [address] [id number]       [address]       [place], [place]  [address]       Ph: [phone] or [phone]       Fax: [phone]   RxID:   [id number] NADOLOL 20 MG TABS (NADOLOL) 1 pill by mouth daily  #30 x 5   Entered and Authorized by:   Alvia Grove DO   Signed by:   Alvia Grove DO on 12/06/2010   Method used:   [facility]ically to        [facility]  [address] [id number]       [address]       [place], [place]  [address]       Ph: [phone] or [phone]       Fax: [phone]   RxID:   [id number] GENERLAC 10 GM/15ML SOLN (LACTULOSE ENCEPHALOPATHY)  take 45mL by mouth two times a day  #1000L x 5   Entered and Authorized by:   Alvia Grove DO   Signed by:   Alvia Grove DO on 12/06/2010   Method used:   [facility]ically [facility]        [facility]  [address] [id number]       [address]       [place], [place]  [address]       Ph: [phone] or [phone]       Fax: [phone]   RxID:   [id number] NEXIUM 40 MG PACK (ESOMEPRAZOLE MAGNESIUM) 1 pill by mouth daily  #30 x 5   Entered and Authorized by:   Alvia Grove DO   Signed by:   Alvia Grove DO on 12/06/2010   Method used:   [facility]ronically to        [facility]  [address] [id number]       [address]       [place], [place]  [address]       Ph: [phone] or [phone]  Fax: [phone]   RxID:   [id number] FOLIC ACID 1 MG TABS (FOLIC ACID) 1 pill daily  #30 x 3   Entered and Authorized by:   Alvia Grove DO   Signed by:   Alvia Grove DO on 12/06/2010   Method used:   [facility]ronically to        [facility]  [address] [id number]       [address]       [place], [place]  [address]       Ph: [phone] or [phone]       Fax: [phone]   RxID:   [id number] LASIX 20 MG TABS (FUROSEMIDE) 1 pill daily  #30 x 6   Entered and Authorized by:   Alvia Grove DO   Signed by:   Alvia Grove DO on 12/06/2010   Method used:   [facility]ronically to        [facility]  [address] [facility]       [address]       [place], [place]  [address]       Ph: [phone] or [phone]       Fax: [phone]   RxID:   [id number] HYDROXYZINE HCL 25 MG TABS (HYDROXYZINE HCL) SIG: Take 1 tab by mouth every 6 hours as needed itch  #90 x 0   Entered and Authorized by:   Alvia Grove DO   Signed by:   Alvia Grove DO on 12/06/2010   Method used:   [facility]ronically to        [facility]  [address] [facility]       [address]       [place], [place]  [address]       Ph: [phone] or [phone]       Fax: [phone]   RxID:   [id number] VITAMIN B-1 100 MG TABS (THIAMINE HCL) 1 pill Po daily  #30 x 9   Entered and Authorized by:   Alvia Grove DO   Signed by:   Alvia Grove DO on 12/06/2010   Method used:   [facility]ically to        [facility]  [address] [facility]       [address]       [place], [place]  [address]       Ph: [phone] or [phone]       Fax: [phone]   RxID:   [id number]    Orders Added: 1)  Comp Met-[facility] [97353-29924] 2)  CBC w/Diff-[facility] [85025] 3)  [facility]- Est Level  3 [26834]

## 2011-02-07 ENCOUNTER — Ambulatory Visit (INDEPENDENT_AMBULATORY_CARE_PROVIDER_SITE_OTHER): Payer: Self-pay | Admitting: Family Medicine

## 2011-02-07 ENCOUNTER — Encounter: Payer: Self-pay | Admitting: Family Medicine

## 2011-02-07 DIAGNOSIS — K7200 Acute and subacute hepatic failure without coma: Secondary | ICD-10-CM

## 2011-02-07 DIAGNOSIS — M25569 Pain in unspecified knee: Secondary | ICD-10-CM

## 2011-02-07 DIAGNOSIS — M25512 Pain in left shoulder: Secondary | ICD-10-CM | POA: Insufficient documentation

## 2011-02-07 DIAGNOSIS — M25519 Pain in unspecified shoulder: Secondary | ICD-10-CM

## 2011-02-07 NOTE — Assessment & Plan Note (Signed)
Pt appears to be doing well no change at this time.

## 2011-02-07 NOTE — Assessment & Plan Note (Addendum)
Same as above, will try tramadol and NSAIDs gave exercises due to some VMO instability, would do injections at follow up if not better would think of topical voltaren gel to avoid systemic side effects.  Pt may be a candidate for simvisc but would need some type of insurance due to being very expensive. Pt also may need narcotics at some point but addictive personality may be a problem.  Discussed with Dr. Mauricio Po

## 2011-02-07 NOTE — Patient Instructions (Addendum)
I am giving you some exercises to do.  Try to 3 sets twice daily  I want you to take the tramadol only two times a day if you really need it Try to do come exercising such as biking or swimming which will not be rough on your joints but will help.  I want you to come back and see me in 4-6 weeks at that time an see how you are doing

## 2011-02-07 NOTE — Assessment & Plan Note (Signed)
Pt appears to have significant osteoarthritis.  No imaging necessary at this time no signs of nerve impingement or rotator cuff,  No red flags, will try tramadol but only twice daily  Due to pt liver failure and only NSAIDS once daily due to chronic kidney disease. Gave pt exercise to strengthen and will have pt come back in 4-6 weeks.  Pt  If return and still have pain would consider injections at that time.  Pt has remote history of alcohol abuse and ?narcotic abuse but very remote, if needed would give some low dose oxycodone and see how pt does if needed.

## 2011-02-07 NOTE — Progress Notes (Signed)
  Subjective:    Patient ID: William [name], male    DOB: [birth year], 50 y.o.   MRN: [id number]  HPI  Pt states  1.  Shoulder pain Side:left Duration:2 months and worsening   Injury?: no What aggravates it: certain movements but don't know which ones What helps it: tramadol somewhat Meds:tramadol and motrin ROS: Denies Muscle weakness, numbness, swelling, radiating pain.   2.  Knee Pain Side:bilateral Injury:none laid concrete though for 30 years Duration: 2 months  Where does it hurt more: going up and down stairs What activities causes pain: as above What helps the pain: tramadol a little bit ROS:  Denies knee giving out, swelling, redness, loss of sensation or muscle weakness.   Hepatic failure-  Pt appears to be doing well taking lactulose daily but has no insurance and finds it expensive, pt has just talked to [facility] today to see if he can get some help with medication.  Review of Systems    see above Objective:   Physical Exam General Appearance:    Alert, cooperative, no distress, appears stated age              Throat:   Lips, mucosa, and tongue normal; teeth and gums normal     Back:     Symmetric, no curvature, ROM normal, no CVA tenderness  Lungs:     Clear to auscultation bilaterally, respirations unlabored      Heart:    Regular rate and rhythm, S1 and S2 normal, no murmur, rub   or gallop     Abdomen:     Soft, non-tender, bowel sounds active all four quadrants,    no masses, no organomegaly no ascites     MSK: Left shoulder: negative hawkins and neers, decrease ROM actively near full ROM passively. Negative empty can, +crepitus heard throughout exam Knees: no swelling, mild patellar tracking laterally bilateral Anterior, posterior, lateral and medially ligament intact.   Neg murrys test. Distally pulses and sensation intact.   Extremities:   Extremities normal, atraumatic, no cyanosis trace edema bilaterally  Pulses:   2+ and symmetric all  extremities  Skin:   Skin color, texture, turgor normal, no rashes or lesions     Neurologic:   CNII-XII intact, normal strength, sensation and reflexes    throughout          Assessment & Plan:

## 2011-03-13 LAB — DIFFERENTIAL
Basophils Absolute: 0 10*3/uL (ref 0.0–0.1)
Basophils Relative: 0 % (ref 0–1)
Basophils Relative: 0 % (ref 0–1)
Eosinophils Absolute: 0 10*3/uL (ref 0.0–0.7)
Eosinophils Relative: 0 % (ref 0–5)
Lymphocytes Relative: 17 % (ref 12–46)
Monocytes Absolute: 0.2 10*3/uL (ref 0.1–1.0)
Monocytes Relative: 14 % — ABNORMAL HIGH (ref 3–12)
Monocytes Relative: 8 % (ref 3–12)
Neutro Abs: 1.9 10*3/uL (ref 1.7–7.7)
Neutrophils Relative %: 63 % (ref 43–77)
Neutrophils Relative %: 69 % (ref 43–77)

## 2011-03-13 LAB — COMPREHENSIVE METABOLIC PANEL
ALT: 34 U/L (ref 0–53)
AST: 65 U/L — ABNORMAL HIGH (ref 0–37)
Albumin: 2.3 g/dL — ABNORMAL LOW (ref 3.5–5.2)
Albumin: 2.6 g/dL — ABNORMAL LOW (ref 3.5–5.2)
Alkaline Phosphatase: 178 U/L — ABNORMAL HIGH (ref 39–117)
BUN: 13 mg/dL (ref 6–23)
CO2: 20 mEq/L (ref 19–32)
Calcium: 8.8 mg/dL (ref 8.4–10.5)
Calcium: 9.1 mg/dL (ref 8.4–10.5)
Creatinine, Ser: 1.13 mg/dL (ref 0.4–1.5)
Creatinine, Ser: 1.16 mg/dL (ref 0.4–1.5)
GFR calc Af Amer: >60 mL/min (ref >60)
Glucose, Bld: 86 mg/dL (ref 70–99)
Glucose, Bld: 92 mg/dL (ref 70–99)
Potassium: 3.7 mEq/L (ref 3.5–5.1)
Potassium: 4 mEq/L (ref 3.5–5.1)
Total Protein: 6 g/dL (ref 6.0–8.3)

## 2011-03-13 LAB — URINALYSIS, ROUTINE W REFLEX MICROSCOPIC
Ketones, ur: NEGATIVE mg/dL
Nitrite: NEGATIVE
Protein, ur: NEGATIVE mg/dL
Specific Gravity, Urine: 1.012 (ref 1.005–1.030)

## 2011-03-13 LAB — CBC
HCT: 17.9 % — ABNORMAL LOW (ref 39.0–52.0)
HCT: 21.7 % — ABNORMAL LOW (ref 39.0–52.0)
Hemoglobin: 6.3 g/dL — CL (ref 13.0–17.0)
Hemoglobin: 7.8 g/dL — ABNORMAL LOW (ref 13.0–17.0)
MCHC: 35.3 g/dL (ref 30.0–36.0)
MCHC: 36 g/dL (ref 30.0–36.0)
MCV: 93.4 fL (ref 78.0–100.0)
Platelets: 134 10*3/uL — ABNORMAL LOW (ref 150–400)
Platelets: 134 10*3/uL — ABNORMAL LOW (ref 150–400)
RDW: 14.3 % (ref 11.5–15.5)
RDW: 17.5 % — ABNORMAL HIGH (ref 11.5–15.5)
WBC: 2.4 10*3/uL — ABNORMAL LOW (ref 4.0–10.5)

## 2011-03-13 LAB — MAGNESIUM: Magnesium: 1.4 mg/dL — ABNORMAL LOW (ref 1.5–2.5)

## 2011-03-13 LAB — TYPE AND SCREEN
ABO/RH(D): B POS
Antibody Screen: NEGATIVE

## 2011-03-13 LAB — HEMOCCULT GUIAC POC 1CARD (OFFICE): Fecal Occult Bld: NEGATIVE

## 2011-03-13 LAB — MRSA PCR SCREENING: MRSA by PCR: NEGATIVE

## 2011-03-13 LAB — PROTIME-INR
INR: 1.44 (ref 0.00–1.49)
Prothrombin Time: 17.4 seconds — ABNORMAL HIGH (ref 11.6–15.2)
Prothrombin Time: 17.6 seconds — ABNORMAL HIGH (ref 11.6–15.2)

## 2011-03-13 LAB — PHOSPHORUS: Phosphorus: 4.2 mg/dL (ref 2.3–4.6)

## 2011-03-13 LAB — ABO/RH: ABO/RH(D): B POS

## 2011-03-14 LAB — COMPREHENSIVE METABOLIC PANEL
ALT: 35 U/L (ref 0–53)
ALT: 67 U/L — ABNORMAL HIGH (ref 0–53)
ALT: 200 U/L — ABNORMAL HIGH (ref 0–53)
ALT: 219 U/L — ABNORMAL HIGH (ref 0–53)
AST: 62 U/L — ABNORMAL HIGH (ref 0–37)
AST: 68 U/L — ABNORMAL HIGH (ref 0–37)
AST: 139 U/L — ABNORMAL HIGH (ref 0–37)
Albumin: 2.2 g/dL — ABNORMAL LOW (ref 3.5–5.2)
Albumin: 2.4 g/dL — ABNORMAL LOW (ref 3.5–5.2)
Albumin: 2.4 g/dL — ABNORMAL LOW (ref 3.5–5.2)
Albumin: 2.3 g/dL — ABNORMAL LOW (ref 3.5–5.2)
Albumin: 2.6 g/dL — ABNORMAL LOW (ref 3.5–5.2)
Albumin: 3.3 g/dL — ABNORMAL LOW (ref 3.5–5.2)
Alkaline Phosphatase: 182 U/L — ABNORMAL HIGH (ref 39–117)
Alkaline Phosphatase: 183 U/L — ABNORMAL HIGH (ref 39–117)
Alkaline Phosphatase: 171 U/L — ABNORMAL HIGH (ref 39–117)
Alkaline Phosphatase: 179 U/L — ABNORMAL HIGH (ref 39–117)
Alkaline Phosphatase: 197 U/L — ABNORMAL HIGH (ref 39–117)
Alkaline Phosphatase: 202 U/L — ABNORMAL HIGH (ref 39–117)
BUN: 13 mg/dL (ref 6–23)
BUN: 14 mg/dL (ref 6–23)
BUN: 21 mg/dL (ref 6–23)
BUN: 24 mg/dL — ABNORMAL HIGH (ref 6–23)
BUN: 48 mg/dL — ABNORMAL HIGH (ref 6–23)
BUN: 52 mg/dL — ABNORMAL HIGH (ref 6–23)
CO2: 22 mEq/L (ref 19–32)
Calcium: 10 mg/dL (ref 8.4–10.5)
Chloride: 110 mEq/L (ref 96–112)
Chloride: 111 mEq/L (ref 96–112)
Chloride: 109 mEq/L (ref 96–112)
Chloride: 111 mEq/L (ref 96–112)
Chloride: 113 mEq/L — ABNORMAL HIGH (ref 96–112)
Chloride: 113 mEq/L — ABNORMAL HIGH (ref 96–112)
Creatinine, Ser: 1.07 mg/dL (ref 0.4–1.5)
Creatinine, Ser: 1.15 mg/dL (ref 0.4–1.5)
Creatinine, Ser: 1.27 mg/dL (ref 0.4–1.5)
Creatinine, Ser: 2.04 mg/dL — ABNORMAL HIGH (ref 0.4–1.5)
GFR calc Af Amer: >60 mL/min (ref >60)
GFR calc Af Amer: >60 mL/min (ref >60)
GFR calc Af Amer: >60 mL/min (ref >60)
GFR calc Af Amer: 42 mL/min — ABNORMAL LOW (ref >60)
Glucose, Bld: 102 mg/dL — ABNORMAL HIGH (ref 70–99)
Glucose, Bld: 108 mg/dL — ABNORMAL HIGH (ref 70–99)
Glucose, Bld: 108 mg/dL — ABNORMAL HIGH (ref 70–99)
Glucose, Bld: 120 mg/dL — ABNORMAL HIGH (ref 70–99)
Potassium: 4 mEq/L (ref 3.5–5.1)
Potassium: 3.7 mEq/L (ref 3.5–5.1)
Potassium: 3.8 mEq/L (ref 3.5–5.1)
Potassium: 5.1 mEq/L (ref 3.5–5.1)
Potassium: 5.2 mEq/L — ABNORMAL HIGH (ref 3.5–5.1)
Potassium: 6.5 mEq/L (ref 3.5–5.1)
Sodium: 137 mEq/L (ref 135–145)
Sodium: 135 mEq/L (ref 135–145)
Sodium: 135 mEq/L (ref 135–145)
Total Bilirubin: 7.3 mg/dL — ABNORMAL HIGH (ref 0.3–1.2)
Total Bilirubin: 10.2 mg/dL — ABNORMAL HIGH (ref 0.3–1.2)
Total Bilirubin: 12 mg/dL — ABNORMAL HIGH (ref 0.3–1.2)
Total Bilirubin: 8 mg/dL — ABNORMAL HIGH (ref 0.3–1.2)
Total Bilirubin: 9.5 mg/dL — ABNORMAL HIGH (ref 0.3–1.2)
Total Protein: 5.7 g/dL — ABNORMAL LOW (ref 6.0–8.3)
Total Protein: 6.1 g/dL (ref 6.0–8.3)
Total Protein: 6.2 g/dL (ref 6.0–8.3)
Total Protein: 7.1 g/dL (ref 6.0–8.3)
Total Protein: 8.8 g/dL — ABNORMAL HIGH (ref 6.0–8.3)

## 2011-03-14 LAB — BASIC METABOLIC PANEL
BUN: 12 mg/dL (ref 6–23)
BUN: 21 mg/dL (ref 6–23)
BUN: 23 mg/dL (ref 6–23)
BUN: 26 mg/dL — ABNORMAL HIGH (ref 6–23)
BUN: 31 mg/dL — ABNORMAL HIGH (ref 6–23)
BUN: 40 mg/dL — ABNORMAL HIGH (ref 6–23)
CO2: 21 mEq/L (ref 19–32)
CO2: 15 mEq/L — ABNORMAL LOW (ref 19–32)
Calcium: 8.2 mg/dL — ABNORMAL LOW (ref 8.4–10.5)
Calcium: 8.4 mg/dL (ref 8.4–10.5)
Calcium: 8.4 mg/dL (ref 8.4–10.5)
Calcium: 8.7 mg/dL (ref 8.4–10.5)
Calcium: 9.1 mg/dL (ref 8.4–10.5)
Chloride: 110 mEq/L (ref 96–112)
Chloride: 108 mEq/L (ref 96–112)
Creatinine, Ser: 1.05 mg/dL (ref 0.4–1.5)
Creatinine, Ser: 1.12 mg/dL (ref 0.4–1.5)
Creatinine, Ser: 1.3 mg/dL (ref 0.4–1.5)
Creatinine, Ser: 1.57 mg/dL — ABNORMAL HIGH (ref 0.4–1.5)
Creatinine, Ser: 1.8 mg/dL — ABNORMAL HIGH (ref 0.4–1.5)
GFR calc Af Amer: >60 mL/min (ref >60)
GFR calc Af Amer: >60 mL/min (ref >60)
GFR calc non Af Amer: >60 mL/min (ref >60)
GFR calc non Af Amer: 40 mL/min — ABNORMAL LOW (ref >60)
GFR calc non Af Amer: 47 mL/min — ABNORMAL LOW (ref >60)
GFR calc non Af Amer: 58 mL/min — ABNORMAL LOW (ref >60)
GFR calc non Af Amer: 59 mL/min — ABNORMAL LOW (ref >60)
GFR calc non Af Amer: >60 mL/min (ref >60)
Glucose, Bld: 77 mg/dL (ref 70–99)
Glucose, Bld: 72 mg/dL (ref 70–99)
Glucose, Bld: 81 mg/dL (ref 70–99)
Glucose, Bld: 91 mg/dL (ref 70–99)
Potassium: 3.7 mEq/L (ref 3.5–5.1)
Sodium: 133 mEq/L — ABNORMAL LOW (ref 135–145)
Sodium: 135 mEq/L (ref 135–145)

## 2011-03-14 LAB — DIFFERENTIAL
Band Neutrophils: 0 % (ref 0–10)
Basophils Absolute: 0 10*3/uL (ref 0.0–0.1)
Basophils Absolute: 0 10*3/uL (ref 0.0–0.1)
Basophils Relative: 0 % (ref 0–1)
Basophils Relative: 0 % (ref 0–1)
Basophils Relative: 0 % (ref 0–1)
Basophils Relative: 0 % (ref 0–1)
Blasts: 0 %
Eosinophils Absolute: 0 10*3/uL (ref 0.0–0.7)
Eosinophils Absolute: 0 10*3/uL (ref 0.0–0.7)
Eosinophils Absolute: 0 10*3/uL (ref 0.0–0.7)
Eosinophils Relative: 0 % (ref 0–5)
Eosinophils Relative: 0 % (ref 0–5)
Eosinophils Relative: 0 % (ref 0–5)
Eosinophils Relative: 0 % (ref 0–5)
Lymphocytes Relative: 30 % (ref 12–46)
Lymphocytes Relative: 42 % (ref 12–46)
Lymphocytes Relative: 28 % (ref 12–46)
Metamyelocytes Relative: 0 %
Metamyelocytes Relative: 0 %
Metamyelocytes Relative: 0 %
Monocytes Absolute: 0.4 10*3/uL (ref 0.1–1.0)
Monocytes Absolute: 0.3 10*3/uL (ref 0.1–1.0)
Monocytes Relative: 2 % — ABNORMAL LOW (ref 3–12)
Monocytes Relative: 4 % (ref 3–12)
Myelocytes: 0 %
Myelocytes: 0 %
Myelocytes: 0 %
Neutro Abs: 1.8 10*3/uL (ref 1.7–7.7)
Neutro Abs: 1.8 10*3/uL (ref 1.7–7.7)
Neutro Abs: 3 10*3/uL (ref 1.7–7.7)
Neutro Abs: 2.6 10*3/uL (ref 1.7–7.7)
Neutro Abs: 7.7 10*3/uL (ref 1.7–7.7)
Neutrophils Relative %: 56 % (ref 43–77)
Neutrophils Relative %: 77 % (ref 43–77)
Neutrophils Relative %: 66 % (ref 43–77)
Neutrophils Relative %: 93 % — ABNORMAL HIGH (ref 43–77)
Promyelocytes Absolute: 0 %
nRBC: 0 /100 WBC
nRBC: 0 /100 WBC

## 2011-03-14 LAB — URINE CULTURE: Colony Count: >=100000

## 2011-03-14 LAB — CBC
HCT: 16.8 % — ABNORMAL LOW (ref 39.0–52.0)
HCT: 19.5 % — ABNORMAL LOW (ref 39.0–52.0)
HCT: 22.5 % — ABNORMAL LOW (ref 39.0–52.0)
HCT: 22.6 % — ABNORMAL LOW (ref 39.0–52.0)
HCT: 23.4 % — ABNORMAL LOW (ref 39.0–52.0)
HCT: 20.7 % — ABNORMAL LOW (ref 39.0–52.0)
HCT: 21 % — ABNORMAL LOW (ref 39.0–52.0)
HCT: 22.1 % — ABNORMAL LOW (ref 39.0–52.0)
HCT: 22.2 % — ABNORMAL LOW (ref 39.0–52.0)
HCT: 22.8 % — ABNORMAL LOW (ref 39.0–52.0)
HCT: 23.2 % — ABNORMAL LOW (ref 39.0–52.0)
Hemoglobin: 6 g/dL — CL (ref 13.0–17.0)
Hemoglobin: 7.9 g/dL — ABNORMAL LOW (ref 13.0–17.0)
Hemoglobin: 8.1 g/dL — ABNORMAL LOW (ref 13.0–17.0)
Hemoglobin: 8.4 g/dL — ABNORMAL LOW (ref 13.0–17.0)
Hemoglobin: 7 g/dL — ABNORMAL LOW (ref 13.0–17.0)
Hemoglobin: 7.4 g/dL — ABNORMAL LOW (ref 13.0–17.0)
Hemoglobin: 7.4 g/dL — ABNORMAL LOW (ref 13.0–17.0)
Hemoglobin: 8.2 g/dL — ABNORMAL LOW (ref 13.0–17.0)
MCHC: 35.1 g/dL (ref 30.0–36.0)
MCHC: 35.4 g/dL (ref 30.0–36.0)
MCHC: 35.5 g/dL (ref 30.0–36.0)
MCHC: 35.6 g/dL (ref 30.0–36.0)
MCHC: 35.8 g/dL (ref 30.0–36.0)
MCHC: 35.8 g/dL (ref 30.0–36.0)
MCHC: 34.8 g/dL (ref 30.0–36.0)
MCHC: 35.6 g/dL (ref 30.0–36.0)
MCV: 100.2 fL — ABNORMAL HIGH (ref 78.0–100.0)
MCV: 102 fL — ABNORMAL HIGH (ref 78.0–100.0)
MCV: 97.2 fL (ref 78.0–100.0)
MCV: 97.6 fL (ref 78.0–100.0)
MCV: 98.4 fL (ref 78.0–100.0)
MCV: 98.8 fL (ref 78.0–100.0)
MCV: 99 fL (ref 78.0–100.0)
MCV: 94.6 fL (ref 78.0–100.0)
MCV: 96.7 fL (ref 78.0–100.0)
MCV: 97.3 fL (ref 78.0–100.0)
Platelets: 108 10*3/uL — ABNORMAL LOW (ref 150–400)
Platelets: 109 10*3/uL — ABNORMAL LOW (ref 150–400)
Platelets: 81 10*3/uL — ABNORMAL LOW (ref 150–400)
Platelets: 87 10*3/uL — ABNORMAL LOW (ref 150–400)
Platelets: 93 10*3/uL — ABNORMAL LOW (ref 150–400)
Platelets: 98 10*3/uL — ABNORMAL LOW (ref 150–400)
Platelets: 99 K/uL — ABNORMAL LOW (ref 150–400)
Platelets: 113 10*3/uL — ABNORMAL LOW (ref 150–400)
Platelets: 115 10*3/uL — ABNORMAL LOW (ref 150–400)
Platelets: 123 10*3/uL — ABNORMAL LOW (ref 150–400)
Platelets: 130 10*3/uL — ABNORMAL LOW (ref 150–400)
Platelets: 134 10*3/uL — ABNORMAL LOW (ref 150–400)
Platelets: 141 10*3/uL — ABNORMAL LOW (ref 150–400)
Platelets: 183 10*3/uL (ref 150–400)
RBC: 1.65 MIL/uL — ABNORMAL LOW (ref 4.22–5.81)
RBC: 2.3 MIL/uL — ABNORMAL LOW (ref 4.22–5.81)
RBC: 2.4 MIL/uL — ABNORMAL LOW (ref 4.22–5.81)
RBC: 2.18 MIL/uL — ABNORMAL LOW (ref 4.22–5.81)
RBC: 2.33 MIL/uL — ABNORMAL LOW (ref 4.22–5.81)
RDW: 16.4 % — ABNORMAL HIGH (ref 11.5–15.5)
RDW: 18.9 % — ABNORMAL HIGH (ref 11.5–15.5)
RDW: 19.1 % — ABNORMAL HIGH (ref 11.5–15.5)
RDW: 19.4 % — ABNORMAL HIGH (ref 11.5–15.5)
RDW: 20.3 % — ABNORMAL HIGH (ref 11.5–15.5)
RDW: 23.1 % — ABNORMAL HIGH (ref 11.5–15.5)
RDW: 23.8 % — ABNORMAL HIGH (ref 11.5–15.5)
RDW: 24 % — ABNORMAL HIGH (ref 11.5–15.5)
RDW: 24.7 % — ABNORMAL HIGH (ref 11.5–15.5)
RDW: 24.9 % — ABNORMAL HIGH (ref 11.5–15.5)
RDW: 25 % — ABNORMAL HIGH (ref 11.5–15.5)
RDW: 25.5 % — ABNORMAL HIGH (ref 11.5–15.5)
RDW: 26.6 % — ABNORMAL HIGH (ref 11.5–15.5)
WBC: 3.2 10*3/uL — ABNORMAL LOW (ref 4.0–10.5)
WBC: 3.3 10*3/uL — ABNORMAL LOW (ref 4.0–10.5)
WBC: 3.4 10*3/uL — ABNORMAL LOW (ref 4.0–10.5)
WBC: 3.7 K/uL — ABNORMAL LOW (ref 4.0–10.5)
WBC: 3.9 10*3/uL — ABNORMAL LOW (ref 4.0–10.5)
WBC: 4.1 10*3/uL (ref 4.0–10.5)
WBC: 4.2 10*3/uL (ref 4.0–10.5)
WBC: 4.2 10*3/uL (ref 4.0–10.5)
WBC: 4.5 10*3/uL (ref 4.0–10.5)
WBC: 5 10*3/uL (ref 4.0–10.5)
WBC: 5 10*3/uL (ref 4.0–10.5)
WBC: 5.9 10*3/uL (ref 4.0–10.5)
WBC: 6.1 10*3/uL (ref 4.0–10.5)

## 2011-03-14 LAB — RETICULOCYTES
RBC.: 2.47 MIL/uL — ABNORMAL LOW (ref 4.22–5.81)
Retic Ct Pct: 1.6 % (ref 0.4–3.1)

## 2011-03-14 LAB — CROSSMATCH: Antibody Screen: NEGATIVE

## 2011-03-14 LAB — HEMOGLOBIN AND HEMATOCRIT, BLOOD
HCT: 21.6 % — ABNORMAL LOW (ref 39.0–52.0)
Hemoglobin: 7.7 g/dL — ABNORMAL LOW (ref 13.0–17.0)

## 2011-03-14 LAB — URINALYSIS, ROUTINE W REFLEX MICROSCOPIC
Ketones, ur: NEGATIVE mg/dL
Nitrite: NEGATIVE
Protein, ur: NEGATIVE mg/dL
Urobilinogen, UA: 0.2 mg/dL (ref 0.0–1.0)
pH: 5.5 (ref 5.0–8.0)

## 2011-03-14 LAB — PROTIME-INR
INR: 1.33 (ref 0.00–1.49)
INR: 1.37 (ref 0.00–1.49)
INR: 1.52 — ABNORMAL HIGH (ref 0.00–1.49)
Prothrombin Time: 16.8 seconds — ABNORMAL HIGH (ref 11.6–15.2)

## 2011-03-14 LAB — FOLATE: Folate: >20 ng/mL

## 2011-03-14 LAB — IRON AND TIBC: UIBC: 37 ug/dL

## 2011-03-14 LAB — LACTATE DEHYDROGENASE: LDH: 432 U/L — ABNORMAL HIGH (ref 94–250)

## 2011-03-14 LAB — DIRECT ANTIGLOBULIN TEST (NOT AT ARMC): DAT, IgG: NEGATIVE

## 2011-03-14 LAB — RAPID URINE DRUG SCREEN, HOSP PERFORMED
Amphetamines: NOT DETECTED
Barbiturates: NOT DETECTED
Benzodiazepines: NOT DETECTED
Cocaine: NOT DETECTED

## 2011-03-14 LAB — ETHANOL: Alcohol, Ethyl (B): <5 mg/dL (ref 0–10)

## 2011-03-14 LAB — FERRITIN: Ferritin: 2349 ng/mL — ABNORMAL HIGH (ref 22–322)

## 2011-03-14 LAB — HAPTOGLOBIN: Haptoglobin: <6 mg/dL — ABNORMAL LOW (ref 16–200)

## 2011-03-15 ENCOUNTER — Other Ambulatory Visit: Payer: Self-pay | Admitting: Family Medicine

## 2011-03-15 LAB — PREPARE FRESH FROZEN PLASMA

## 2011-03-15 LAB — HEMOGLOBIN AND HEMATOCRIT, BLOOD
HCT: 25.1 % — ABNORMAL LOW (ref 39.0–52.0)
HCT: 25.6 % — ABNORMAL LOW (ref 39.0–52.0)
HCT: 26 % — ABNORMAL LOW (ref 39.0–52.0)
HCT: 26.1 % — ABNORMAL LOW (ref 39.0–52.0)
HCT: 26.2 % — ABNORMAL LOW (ref 39.0–52.0)
HCT: 27.7 % — ABNORMAL LOW (ref 39.0–52.0)
HCT: 28 % — ABNORMAL LOW (ref 39.0–52.0)
Hemoglobin: 8.3 g/dL — ABNORMAL LOW (ref 13.0–17.0)
Hemoglobin: 8.9 g/dL — ABNORMAL LOW (ref 13.0–17.0)
Hemoglobin: 9.1 g/dL — ABNORMAL LOW (ref 13.0–17.0)
Hemoglobin: 9.3 g/dL — ABNORMAL LOW (ref 13.0–17.0)
Hemoglobin: 9.4 g/dL — ABNORMAL LOW (ref 13.0–17.0)
Hemoglobin: 9.5 g/dL — ABNORMAL LOW (ref 13.0–17.0)
Hemoglobin: 9.8 g/dL — ABNORMAL LOW (ref 13.0–17.0)

## 2011-03-15 LAB — DIFFERENTIAL
Band Neutrophils: 0 % (ref 0–10)
Basophils Absolute: 0 10*3/uL (ref 0.0–0.1)
Basophils Relative: 0 % (ref 0–1)
Blasts: 0 %
Blasts: 0 %
Eosinophils Absolute: 0 10*3/uL (ref 0.0–0.7)
Lymphocytes Relative: 1 % — ABNORMAL LOW (ref 12–46)
Lymphs Abs: 0.1 10*3/uL — ABNORMAL LOW (ref 0.7–4.0)
Lymphs Abs: 0.6 10*3/uL — ABNORMAL LOW (ref 0.7–4.0)
Metamyelocytes Relative: 0 %
Monocytes Absolute: 0 10*3/uL — ABNORMAL LOW (ref 0.1–1.0)
Monocytes Relative: 0 % — ABNORMAL LOW (ref 3–12)
Monocytes Relative: 3 % (ref 3–12)
Myelocytes: 0 %
Myelocytes: 0 %
Neutro Abs: 12.8 10*3/uL — ABNORMAL HIGH (ref 1.7–7.7)
Neutro Abs: 13.7 10*3/uL — ABNORMAL HIGH (ref 1.7–7.7)
Neutrophils Relative %: 96 % — ABNORMAL HIGH (ref 43–77)
Neutrophils Relative %: 92 % — ABNORMAL HIGH (ref 43–77)
Promyelocytes Absolute: 0 %

## 2011-03-15 LAB — BLOOD GAS, ARTERIAL
Acid-base deficit: 13.5 mmol/L — ABNORMAL HIGH (ref 0.0–2.0)
FIO2: 0.21 %
O2 Saturation: 96.1 %
pCO2 arterial: 25.3 mmHg — ABNORMAL LOW (ref 35.0–45.0)
pO2, Arterial: 80.2 mmHg (ref 80.0–100.0)

## 2011-03-15 LAB — GLUCOSE, CAPILLARY
Glucose-Capillary: 110 mg/dL — ABNORMAL HIGH (ref 70–99)
Glucose-Capillary: 117 mg/dL — ABNORMAL HIGH (ref 70–99)
Glucose-Capillary: 119 mg/dL — ABNORMAL HIGH (ref 70–99)
Glucose-Capillary: 120 mg/dL — ABNORMAL HIGH (ref 70–99)
Glucose-Capillary: 132 mg/dL — ABNORMAL HIGH (ref 70–99)
Glucose-Capillary: 132 mg/dL — ABNORMAL HIGH (ref 70–99)
Glucose-Capillary: 132 mg/dL — ABNORMAL HIGH (ref 70–99)
Glucose-Capillary: 134 mg/dL — ABNORMAL HIGH (ref 70–99)
Glucose-Capillary: 137 mg/dL — ABNORMAL HIGH (ref 70–99)
Glucose-Capillary: 142 mg/dL — ABNORMAL HIGH (ref 70–99)
Glucose-Capillary: 143 mg/dL — ABNORMAL HIGH (ref 70–99)
Glucose-Capillary: 144 mg/dL — ABNORMAL HIGH (ref 70–99)
Glucose-Capillary: 150 mg/dL — ABNORMAL HIGH (ref 70–99)
Glucose-Capillary: 156 mg/dL — ABNORMAL HIGH (ref 70–99)
Glucose-Capillary: 159 mg/dL — ABNORMAL HIGH (ref 70–99)
Glucose-Capillary: 160 mg/dL — ABNORMAL HIGH (ref 70–99)
Glucose-Capillary: 163 mg/dL — ABNORMAL HIGH (ref 70–99)
Glucose-Capillary: 165 mg/dL — ABNORMAL HIGH (ref 70–99)
Glucose-Capillary: 172 mg/dL — ABNORMAL HIGH (ref 70–99)
Glucose-Capillary: 203 mg/dL — ABNORMAL HIGH (ref 70–99)
Glucose-Capillary: 217 mg/dL — ABNORMAL HIGH (ref 70–99)
Glucose-Capillary: 218 mg/dL — ABNORMAL HIGH (ref 70–99)
Glucose-Capillary: 86 mg/dL (ref 70–99)
Glucose-Capillary: 96 mg/dL (ref 70–99)

## 2011-03-15 LAB — COMPREHENSIVE METABOLIC PANEL
ALT: 20 U/L (ref 0–53)
ALT: 29 U/L (ref 0–53)
ALT: 50 U/L (ref 0–53)
ALT: 23 U/L (ref 0–53)
ALT: 40 U/L (ref 0–53)
ALT: 40 U/L (ref 0–53)
ALT: 43 U/L (ref 0–53)
AST: 107 U/L — ABNORMAL HIGH (ref 0–37)
AST: 74 U/L — ABNORMAL HIGH (ref 0–37)
AST: 153 U/L — ABNORMAL HIGH (ref 0–37)
AST: 196 U/L — ABNORMAL HIGH (ref 0–37)
AST: 59 U/L — ABNORMAL HIGH (ref 0–37)
AST: 59 U/L — ABNORMAL HIGH (ref 0–37)
AST: 64 U/L — ABNORMAL HIGH (ref 0–37)
AST: 67 U/L — ABNORMAL HIGH (ref 0–37)
AST: 75 U/L — ABNORMAL HIGH (ref 0–37)
Albumin: 2.4 g/dL — ABNORMAL LOW (ref 3.5–5.2)
Albumin: 2.7 g/dL — ABNORMAL LOW (ref 3.5–5.2)
Albumin: 2.8 g/dL — ABNORMAL LOW (ref 3.5–5.2)
Albumin: 1.8 g/dL — ABNORMAL LOW (ref 3.5–5.2)
Albumin: 2.5 g/dL — ABNORMAL LOW (ref 3.5–5.2)
Albumin: 2.8 g/dL — ABNORMAL LOW (ref 3.5–5.2)
Albumin: 2.8 g/dL — ABNORMAL LOW (ref 3.5–5.2)
Albumin: 2.9 g/dL — ABNORMAL LOW (ref 3.5–5.2)
Alkaline Phosphatase: 167 U/L — ABNORMAL HIGH (ref 39–117)
Alkaline Phosphatase: 173 U/L — ABNORMAL HIGH (ref 39–117)
Alkaline Phosphatase: 173 U/L — ABNORMAL HIGH (ref 39–117)
Alkaline Phosphatase: 104 U/L (ref 39–117)
Alkaline Phosphatase: 106 U/L (ref 39–117)
Alkaline Phosphatase: 113 U/L (ref 39–117)
BUN: 59 mg/dL — ABNORMAL HIGH (ref 6–23)
BUN: 64 mg/dL — ABNORMAL HIGH (ref 6–23)
BUN: 72 mg/dL — ABNORMAL HIGH (ref 6–23)
BUN: 77 mg/dL — ABNORMAL HIGH (ref 6–23)
BUN: 91 mg/dL — ABNORMAL HIGH (ref 6–23)
BUN: 96 mg/dL — ABNORMAL HIGH (ref 6–23)
BUN: 96 mg/dL — ABNORMAL HIGH (ref 6–23)
CO2: 16 mEq/L — ABNORMAL LOW (ref 19–32)
CO2: 22 mEq/L (ref 19–32)
CO2: 13 mEq/L — ABNORMAL LOW (ref 19–32)
CO2: 13 mEq/L — ABNORMAL LOW (ref 19–32)
CO2: 16 mEq/L — ABNORMAL LOW (ref 19–32)
CO2: 16 mEq/L — ABNORMAL LOW (ref 19–32)
CO2: 16 mEq/L — ABNORMAL LOW (ref 19–32)
Calcium: 8.7 mg/dL (ref 8.4–10.5)
Calcium: 9.2 mg/dL (ref 8.4–10.5)
Calcium: 9.7 mg/dL (ref 8.4–10.5)
Calcium: 8 mg/dL — ABNORMAL LOW (ref 8.4–10.5)
Calcium: 8.1 mg/dL — ABNORMAL LOW (ref 8.4–10.5)
Calcium: 8.2 mg/dL — ABNORMAL LOW (ref 8.4–10.5)
Calcium: 8.5 mg/dL (ref 8.4–10.5)
Chloride: 103 mEq/L (ref 96–112)
Chloride: 112 mEq/L (ref 96–112)
Chloride: 113 mEq/L — ABNORMAL HIGH (ref 96–112)
Chloride: 109 mEq/L (ref 96–112)
Chloride: 110 mEq/L (ref 96–112)
Chloride: 111 mEq/L (ref 96–112)
Chloride: 116 mEq/L — ABNORMAL HIGH (ref 96–112)
Chloride: 117 mEq/L — ABNORMAL HIGH (ref 96–112)
Chloride: 119 mEq/L — ABNORMAL HIGH (ref 96–112)
Chloride: 121 mEq/L — ABNORMAL HIGH (ref 96–112)
Creatinine, Ser: 2.15 mg/dL — ABNORMAL HIGH (ref 0.4–1.5)
Creatinine, Ser: 2.31 mg/dL — ABNORMAL HIGH (ref 0.4–1.5)
Creatinine, Ser: 2.44 mg/dL — ABNORMAL HIGH (ref 0.4–1.5)
Creatinine, Ser: 2.8 mg/dL — ABNORMAL HIGH (ref 0.4–1.5)
Creatinine, Ser: 3.76 mg/dL — ABNORMAL HIGH (ref 0.4–1.5)
Creatinine, Ser: 4.47 mg/dL — ABNORMAL HIGH (ref 0.4–1.5)
Creatinine, Ser: 5.15 mg/dL — ABNORMAL HIGH (ref 0.4–1.5)
Creatinine, Ser: 6.35 mg/dL — ABNORMAL HIGH (ref 0.4–1.5)
Creatinine, Ser: 6.47 mg/dL — ABNORMAL HIGH (ref 0.4–1.5)
Creatinine, Ser: 6.57 mg/dL — ABNORMAL HIGH (ref 0.4–1.5)
GFR calc Af Amer: 36 mL/min — ABNORMAL LOW (ref >60)
GFR calc Af Amer: 11 mL/min — ABNORMAL LOW (ref >60)
GFR calc Af Amer: 14 mL/min — ABNORMAL LOW (ref >60)
GFR calc Af Amer: 17 mL/min — ABNORMAL LOW (ref >60)
GFR calc Af Amer: 24 mL/min — ABNORMAL LOW (ref >60)
GFR calc Af Amer: 26 mL/min — ABNORMAL LOW (ref >60)
GFR calc Af Amer: 29 mL/min — ABNORMAL LOW (ref >60)
GFR calc non Af Amer: 28 mL/min — ABNORMAL LOW (ref >60)
GFR calc non Af Amer: 30 mL/min — ABNORMAL LOW (ref >60)
GFR calc non Af Amer: 33 mL/min — ABNORMAL LOW (ref >60)
GFR calc non Af Amer: 12 mL/min — ABNORMAL LOW (ref >60)
GFR calc non Af Amer: 20 mL/min — ABNORMAL LOW (ref >60)
GFR calc non Af Amer: 21 mL/min — ABNORMAL LOW (ref >60)
GFR calc non Af Amer: 9 mL/min — ABNORMAL LOW (ref >60)
GFR calc non Af Amer: 9 mL/min — ABNORMAL LOW (ref >60)
GFR calc non Af Amer: 9 mL/min — ABNORMAL LOW (ref >60)
Glucose, Bld: 111 mg/dL — ABNORMAL HIGH (ref 70–99)
Glucose, Bld: 126 mg/dL — ABNORMAL HIGH (ref 70–99)
Glucose, Bld: 93 mg/dL (ref 70–99)
Glucose, Bld: 95 mg/dL (ref 70–99)
Glucose, Bld: 133 mg/dL — ABNORMAL HIGH (ref 70–99)
Glucose, Bld: 74 mg/dL (ref 70–99)
Glucose, Bld: 89 mg/dL (ref 70–99)
Glucose, Bld: 97 mg/dL (ref 70–99)
Potassium: 3.2 mEq/L — ABNORMAL LOW (ref 3.5–5.1)
Potassium: 3.9 mEq/L (ref 3.5–5.1)
Potassium: 4.1 mEq/L (ref 3.5–5.1)
Potassium: 3.7 mEq/L (ref 3.5–5.1)
Potassium: 3.8 mEq/L (ref 3.5–5.1)
Potassium: 4 mEq/L (ref 3.5–5.1)
Sodium: 138 mEq/L (ref 135–145)
Sodium: 140 mEq/L (ref 135–145)
Sodium: 141 mEq/L (ref 135–145)
Sodium: 133 mEq/L — ABNORMAL LOW (ref 135–145)
Sodium: 142 mEq/L (ref 135–145)
Sodium: 144 mEq/L (ref 135–145)
Sodium: 146 mEq/L — ABNORMAL HIGH (ref 135–145)
Sodium: 148 mEq/L — ABNORMAL HIGH (ref 135–145)
Total Bilirubin: 16.4 mg/dL — ABNORMAL HIGH (ref 0.3–1.2)
Total Bilirubin: 17.3 mg/dL — ABNORMAL HIGH (ref 0.3–1.2)
Total Bilirubin: 18 mg/dL — ABNORMAL HIGH (ref 0.3–1.2)
Total Bilirubin: 18.5 mg/dL (ref 0.3–1.2)
Total Bilirubin: 21.5 mg/dL (ref 0.3–1.2)
Total Bilirubin: 26.6 mg/dL (ref 0.3–1.2)
Total Bilirubin: 28.5 mg/dL (ref 0.3–1.2)
Total Bilirubin: 29 mg/dL (ref 0.3–1.2)
Total Bilirubin: 36.5 mg/dL (ref 0.3–1.2)
Total Protein: 6.4 g/dL (ref 6.0–8.3)
Total Protein: 6.6 g/dL (ref 6.0–8.3)
Total Protein: 7.3 g/dL (ref 6.0–8.3)
Total Protein: 6.9 g/dL (ref 6.0–8.3)
Total Protein: 7.2 g/dL (ref 6.0–8.3)
Total Protein: 7.5 g/dL (ref 6.0–8.3)

## 2011-03-15 LAB — CARDIOLIPIN ANTIBODIES, IGG, IGM, IGA: Anticardiolipin IgG: 9 GPL U/mL — ABNORMAL LOW (ref <23)

## 2011-03-15 LAB — CBC
HCT: 25.1 % — ABNORMAL LOW (ref 39.0–52.0)
HCT: 25.8 % — ABNORMAL LOW (ref 39.0–52.0)
HCT: 26 % — ABNORMAL LOW (ref 39.0–52.0)
HCT: 14.6 % — ABNORMAL LOW (ref 39.0–52.0)
HCT: 18.9 % — ABNORMAL LOW (ref 39.0–52.0)
HCT: 20.9 % — ABNORMAL LOW (ref 39.0–52.0)
HCT: 21 % — ABNORMAL LOW (ref 39.0–52.0)
HCT: 22.3 % — ABNORMAL LOW (ref 39.0–52.0)
HCT: 25.1 % — ABNORMAL LOW (ref 39.0–52.0)
HCT: 25.4 % — ABNORMAL LOW (ref 39.0–52.0)
HCT: 26.6 % — ABNORMAL LOW (ref 39.0–52.0)
HCT: 27.5 % — ABNORMAL LOW (ref 39.0–52.0)
Hemoglobin: 8.8 g/dL — ABNORMAL LOW (ref 13.0–17.0)
Hemoglobin: 9 g/dL — ABNORMAL LOW (ref 13.0–17.0)
Hemoglobin: 9 g/dL — ABNORMAL LOW (ref 13.0–17.0)
Hemoglobin: 9.2 g/dL — ABNORMAL LOW (ref 13.0–17.0)
Hemoglobin: 6.8 g/dL — CL (ref 13.0–17.0)
Hemoglobin: 7 g/dL — ABNORMAL LOW (ref 13.0–17.0)
Hemoglobin: 7.4 g/dL — ABNORMAL LOW (ref 13.0–17.0)
Hemoglobin: 7.4 g/dL — ABNORMAL LOW (ref 13.0–17.0)
Hemoglobin: 7.5 g/dL — ABNORMAL LOW (ref 13.0–17.0)
Hemoglobin: 7.6 g/dL — ABNORMAL LOW (ref 13.0–17.0)
Hemoglobin: 7.6 g/dL — ABNORMAL LOW (ref 13.0–17.0)
Hemoglobin: 8 g/dL — ABNORMAL LOW (ref 13.0–17.0)
Hemoglobin: 8.9 g/dL — ABNORMAL LOW (ref 13.0–17.0)
Hemoglobin: 9.1 g/dL — ABNORMAL LOW (ref 13.0–17.0)
Hemoglobin: 9.5 g/dL — ABNORMAL LOW (ref 13.0–17.0)
MCHC: 35 g/dL (ref 30.0–36.0)
MCHC: 35.1 g/dL (ref 30.0–36.0)
MCHC: 35.4 g/dL (ref 30.0–36.0)
MCHC: 35.6 g/dL (ref 30.0–36.0)
MCHC: 35.9 g/dL (ref 30.0–36.0)
MCHC: 36.2 g/dL — ABNORMAL HIGH (ref 30.0–36.0)
MCHC: 34.4 g/dL (ref 30.0–36.0)
MCHC: 35 g/dL (ref 30.0–36.0)
MCHC: 35.1 g/dL (ref 30.0–36.0)
MCHC: 35.2 g/dL (ref 30.0–36.0)
MCHC: 35.4 g/dL (ref 30.0–36.0)
MCHC: 35.4 g/dL (ref 30.0–36.0)
MCHC: 35.5 g/dL (ref 30.0–36.0)
MCHC: 35.8 g/dL (ref 30.0–36.0)
MCHC: 35.9 g/dL (ref 30.0–36.0)
MCHC: 36 g/dL (ref 30.0–36.0)
MCHC: 36 g/dL (ref 30.0–36.0)
MCHC: 36 g/dL (ref 30.0–36.0)
MCV: 91.5 fL (ref 78.0–100.0)
MCV: 91.7 fL (ref 78.0–100.0)
MCV: 92.5 fL (ref 78.0–100.0)
MCV: 92.9 fL (ref 78.0–100.0)
MCV: 93 fL (ref 78.0–100.0)
MCV: 94 fL (ref 78.0–100.0)
MCV: 100.1 fL — ABNORMAL HIGH (ref 78.0–100.0)
MCV: 93.2 fL (ref 78.0–100.0)
MCV: 96.3 fL (ref 78.0–100.0)
MCV: 97.1 fL (ref 78.0–100.0)
MCV: 97.1 fL (ref 78.0–100.0)
MCV: 97.3 fL (ref 78.0–100.0)
MCV: 97.4 fL (ref 78.0–100.0)
Platelets: 110 10*3/uL — ABNORMAL LOW (ref 150–400)
Platelets: 136 10*3/uL — ABNORMAL LOW (ref 150–400)
Platelets: 138 10*3/uL — ABNORMAL LOW (ref 150–400)
Platelets: 141 10*3/uL — ABNORMAL LOW (ref 150–400)
Platelets: 142 10*3/uL — ABNORMAL LOW (ref 150–400)
Platelets: 95 10*3/uL — ABNORMAL LOW (ref 150–400)
Platelets: 100 10*3/uL — ABNORMAL LOW (ref 150–400)
Platelets: 119 10*3/uL — ABNORMAL LOW (ref 150–400)
Platelets: 120 10*3/uL — ABNORMAL LOW (ref 150–400)
Platelets: 124 10*3/uL — ABNORMAL LOW (ref 150–400)
Platelets: 127 10*3/uL — ABNORMAL LOW (ref 150–400)
Platelets: 152 10*3/uL (ref 150–400)
Platelets: 160 10*3/uL (ref 150–400)
Platelets: 168 10*3/uL (ref 150–400)
Platelets: 176 10*3/uL (ref 150–400)
Platelets: 79 10*3/uL — ABNORMAL LOW (ref 150–400)
Platelets: 85 10*3/uL — ABNORMAL LOW (ref 150–400)
Platelets: 90 10*3/uL — ABNORMAL LOW (ref 150–400)
RBC: 2.68 MIL/uL — ABNORMAL LOW (ref 4.22–5.81)
RBC: 2.72 MIL/uL — ABNORMAL LOW (ref 4.22–5.81)
RBC: 2.74 MIL/uL — ABNORMAL LOW (ref 4.22–5.81)
RBC: 2.8 MIL/uL — ABNORMAL LOW (ref 4.22–5.81)
RBC: 2.89 MIL/uL — ABNORMAL LOW (ref 4.22–5.81)
RBC: 2.05 MIL/uL — ABNORMAL LOW (ref 4.22–5.81)
RBC: 2.1 MIL/uL — ABNORMAL LOW (ref 4.22–5.81)
RBC: 2.15 MIL/uL — ABNORMAL LOW (ref 4.22–5.81)
RBC: 2.2 MIL/uL — ABNORMAL LOW (ref 4.22–5.81)
RBC: 2.25 MIL/uL — ABNORMAL LOW (ref 4.22–5.81)
RBC: 2.28 MIL/uL — ABNORMAL LOW (ref 4.22–5.81)
RBC: 2.58 MIL/uL — ABNORMAL LOW (ref 4.22–5.81)
RBC: 2.74 MIL/uL — ABNORMAL LOW (ref 4.22–5.81)
RDW: 20.7 % — ABNORMAL HIGH (ref 11.5–15.5)
RDW: 22.1 % — ABNORMAL HIGH (ref 11.5–15.5)
RDW: 24.7 % — ABNORMAL HIGH (ref 11.5–15.5)
RDW: 15.4 % (ref 11.5–15.5)
RDW: 21.3 % — ABNORMAL HIGH (ref 11.5–15.5)
RDW: 21.5 % — ABNORMAL HIGH (ref 11.5–15.5)
RDW: 21.6 % — ABNORMAL HIGH (ref 11.5–15.5)
RDW: 21.6 % — ABNORMAL HIGH (ref 11.5–15.5)
RDW: 21.8 % — ABNORMAL HIGH (ref 11.5–15.5)
RDW: 21.9 % — ABNORMAL HIGH (ref 11.5–15.5)
RDW: 22.1 % — ABNORMAL HIGH (ref 11.5–15.5)
RDW: 22.1 % — ABNORMAL HIGH (ref 11.5–15.5)
RDW: 22.2 % — ABNORMAL HIGH (ref 11.5–15.5)
WBC: 11.8 10*3/uL — ABNORMAL HIGH (ref 4.0–10.5)
WBC: 12.1 10*3/uL — ABNORMAL HIGH (ref 4.0–10.5)
WBC: 13.3 10*3/uL — ABNORMAL HIGH (ref 4.0–10.5)
WBC: 13.3 10*3/uL — ABNORMAL HIGH (ref 4.0–10.5)
WBC: 13.7 10*3/uL — ABNORMAL HIGH (ref 4.0–10.5)
WBC: 11.3 10*3/uL — ABNORMAL HIGH (ref 4.0–10.5)
WBC: 11.8 10*3/uL — ABNORMAL HIGH (ref 4.0–10.5)
WBC: 12.4 10*3/uL — ABNORMAL HIGH (ref 4.0–10.5)
WBC: 12.8 10*3/uL — ABNORMAL HIGH (ref 4.0–10.5)
WBC: 13.5 10*3/uL — ABNORMAL HIGH (ref 4.0–10.5)
WBC: 15.1 10*3/uL — ABNORMAL HIGH (ref 4.0–10.5)
WBC: 16.9 10*3/uL — ABNORMAL HIGH (ref 4.0–10.5)
WBC: 9.6 10*3/uL (ref 4.0–10.5)

## 2011-03-15 LAB — BASIC METABOLIC PANEL
BUN: 53 mg/dL — ABNORMAL HIGH (ref 6–23)
BUN: 60 mg/dL — ABNORMAL HIGH (ref 6–23)
BUN: 61 mg/dL — ABNORMAL HIGH (ref 6–23)
BUN: 71 mg/dL — ABNORMAL HIGH (ref 6–23)
BUN: 77 mg/dL — ABNORMAL HIGH (ref 6–23)
BUN: 78 mg/dL — ABNORMAL HIGH (ref 6–23)
BUN: 82 mg/dL — ABNORMAL HIGH (ref 6–23)
BUN: 91 mg/dL — ABNORMAL HIGH (ref 6–23)
CO2: 21 mEq/L (ref 19–32)
CO2: 21 mEq/L (ref 19–32)
CO2: 29 mEq/L (ref 19–32)
CO2: 14 mEq/L — ABNORMAL LOW (ref 19–32)
CO2: 16 mEq/L — ABNORMAL LOW (ref 19–32)
Calcium: 10 mg/dL (ref 8.4–10.5)
Calcium: 8.8 mg/dL (ref 8.4–10.5)
Calcium: 9.4 mg/dL (ref 8.4–10.5)
Calcium: 9.7 mg/dL (ref 8.4–10.5)
Calcium: 9.8 mg/dL (ref 8.4–10.5)
Calcium: 10 mg/dL (ref 8.4–10.5)
Calcium: 9.7 mg/dL (ref 8.4–10.5)
Calcium: 9.9 mg/dL (ref 8.4–10.5)
Calcium: 9.9 mg/dL (ref 8.4–10.5)
Chloride: 103 mEq/L (ref 96–112)
Chloride: 105 mEq/L (ref 96–112)
Chloride: 108 mEq/L (ref 96–112)
Chloride: 108 mEq/L (ref 96–112)
Chloride: 113 mEq/L — ABNORMAL HIGH (ref 96–112)
Chloride: 120 mEq/L — ABNORMAL HIGH (ref 96–112)
Creatinine, Ser: 2.08 mg/dL — ABNORMAL HIGH (ref 0.4–1.5)
Creatinine, Ser: 2.1 mg/dL — ABNORMAL HIGH (ref 0.4–1.5)
Creatinine, Ser: 2.21 mg/dL — ABNORMAL HIGH (ref 0.4–1.5)
Creatinine, Ser: 2.4 mg/dL — ABNORMAL HIGH (ref 0.4–1.5)
Creatinine, Ser: 2.41 mg/dL — ABNORMAL HIGH (ref 0.4–1.5)
Creatinine, Ser: 3.88 mg/dL — ABNORMAL HIGH (ref 0.4–1.5)
Creatinine, Ser: 5.39 mg/dL — ABNORMAL HIGH (ref 0.4–1.5)
Creatinine, Ser: 6.38 mg/dL — ABNORMAL HIGH (ref 0.4–1.5)
GFR calc Af Amer: 35 mL/min — ABNORMAL LOW (ref >60)
GFR calc Af Amer: 35 mL/min — ABNORMAL LOW (ref >60)
GFR calc Af Amer: 38 mL/min — ABNORMAL LOW (ref >60)
GFR calc Af Amer: 41 mL/min — ABNORMAL LOW (ref >60)
GFR calc Af Amer: 14 mL/min — ABNORMAL LOW (ref >60)
GFR calc non Af Amer: 29 mL/min — ABNORMAL LOW (ref >60)
GFR calc non Af Amer: 34 mL/min — ABNORMAL LOW (ref >60)
GFR calc non Af Amer: 11 mL/min — ABNORMAL LOW (ref >60)
GFR calc non Af Amer: 14 mL/min — ABNORMAL LOW (ref >60)
Glucose, Bld: 103 mg/dL — ABNORMAL HIGH (ref 70–99)
Glucose, Bld: 76 mg/dL (ref 70–99)
Glucose, Bld: 135 mg/dL — ABNORMAL HIGH (ref 70–99)
Glucose, Bld: 152 mg/dL — ABNORMAL HIGH (ref 70–99)
Glucose, Bld: 252 mg/dL — ABNORMAL HIGH (ref 70–99)
Glucose, Bld: 71 mg/dL (ref 70–99)
Potassium: 4.3 mEq/L (ref 3.5–5.1)
Potassium: 2.8 mEq/L — ABNORMAL LOW (ref 3.5–5.1)
Potassium: 3.4 mEq/L — ABNORMAL LOW (ref 3.5–5.1)
Sodium: 137 mEq/L (ref 135–145)
Sodium: 139 mEq/L (ref 135–145)
Sodium: 145 mEq/L (ref 135–145)
Sodium: 146 mEq/L — ABNORMAL HIGH (ref 135–145)
Sodium: 148 mEq/L — ABNORMAL HIGH (ref 135–145)

## 2011-03-15 LAB — CROSSMATCH
ABO/RH(D): B POS
Antibody Screen: NEGATIVE
Antibody Screen: NEGATIVE

## 2011-03-15 LAB — RENAL FUNCTION PANEL
Albumin: 2.4 g/dL — ABNORMAL LOW (ref 3.5–5.2)
Albumin: 2.7 g/dL — ABNORMAL LOW (ref 3.5–5.2)
Albumin: 3.1 g/dL — ABNORMAL LOW (ref 3.5–5.2)
BUN: 86 mg/dL — ABNORMAL HIGH (ref 6–23)
BUN: 89 mg/dL — ABNORMAL HIGH (ref 6–23)
CO2: 13 mEq/L — ABNORMAL LOW (ref 19–32)
CO2: 14 mEq/L — ABNORMAL LOW (ref 19–32)
Calcium: 10 mg/dL (ref 8.4–10.5)
Calcium: 8.4 mg/dL (ref 8.4–10.5)
Calcium: 9.6 mg/dL (ref 8.4–10.5)
Chloride: 113 mEq/L — ABNORMAL HIGH (ref 96–112)
Creatinine, Ser: 5.54 mg/dL — ABNORMAL HIGH (ref 0.4–1.5)
Creatinine, Ser: 5.78 mg/dL — ABNORMAL HIGH (ref 0.4–1.5)
Creatinine, Ser: 6.39 mg/dL — ABNORMAL HIGH (ref 0.4–1.5)
GFR calc Af Amer: 11 mL/min — ABNORMAL LOW (ref >60)
GFR calc Af Amer: 13 mL/min — ABNORMAL LOW (ref >60)
GFR calc non Af Amer: 10 mL/min — ABNORMAL LOW (ref >60)
GFR calc non Af Amer: 11 mL/min — ABNORMAL LOW (ref >60)
GFR calc non Af Amer: 9 mL/min — ABNORMAL LOW (ref >60)
Glucose, Bld: 150 mg/dL — ABNORMAL HIGH (ref 70–99)
Phosphorus: 8.3 mg/dL — ABNORMAL HIGH (ref 2.3–4.6)
Phosphorus: 9.7 mg/dL (ref 2.3–4.6)
Potassium: 3.1 mEq/L — ABNORMAL LOW (ref 3.5–5.1)

## 2011-03-15 LAB — BODY FLUID CELL COUNT WITH DIFFERENTIAL
Monocyte-Macrophage-Serous Fluid: 28 % — ABNORMAL LOW (ref 50–90)
Neutrophil Count, Fluid: 2 % (ref 0–25)
Total Nucleated Cell Count, Fluid: 295 cu mm (ref 0–1000)

## 2011-03-15 LAB — HEPATIC FUNCTION PANEL
ALT: 39 U/L (ref 0–53)
AST: 53 U/L — ABNORMAL HIGH (ref 0–37)
AST: 67 U/L — ABNORMAL HIGH (ref 0–37)
AST: 81 U/L — ABNORMAL HIGH (ref 0–37)
Albumin: 2.7 g/dL — ABNORMAL LOW (ref 3.5–5.2)
Albumin: 2.8 g/dL — ABNORMAL LOW (ref 3.5–5.2)
Alkaline Phosphatase: 108 U/L (ref 39–117)
Bilirubin, Direct: 11.3 mg/dL — ABNORMAL HIGH (ref 0.0–0.3)
Bilirubin, Direct: 11.7 mg/dL — ABNORMAL HIGH (ref 0.0–0.3)
Total Bilirubin: 28.6 mg/dL (ref 0.3–1.2)
Total Bilirubin: 32 mg/dL (ref 0.3–1.2)

## 2011-03-15 LAB — URINE MICROSCOPIC-ADD ON

## 2011-03-15 LAB — AMMONIA
Ammonia: 58 umol/L — ABNORMAL HIGH (ref 11–35)
Ammonia: 110 umol/L — ABNORMAL HIGH (ref 11–35)
Ammonia: 153 umol/L — ABNORMAL HIGH (ref 11–35)
Ammonia: 42 umol/L — ABNORMAL HIGH (ref 11–35)

## 2011-03-15 LAB — CLOSTRIDIUM DIFFICILE EIA
C difficile Toxins A+B, EIA: NEGATIVE
C difficile Toxins A+B, EIA: NEGATIVE

## 2011-03-15 LAB — URINALYSIS, ROUTINE W REFLEX MICROSCOPIC
Glucose, UA: NEGATIVE mg/dL
Glucose, UA: NEGATIVE mg/dL
Hgb urine dipstick: NEGATIVE
Leukocytes, UA: NEGATIVE
Leukocytes, UA: NEGATIVE
Nitrite: NEGATIVE
Protein, ur: 30 mg/dL — AB
Protein, ur: 30 mg/dL — AB
Specific Gravity, Urine: 1.009 (ref 1.005–1.030)
Specific Gravity, Urine: 1.012 (ref 1.005–1.030)
Specific Gravity, Urine: 1.014 (ref 1.005–1.030)
Urobilinogen, UA: 0.2 mg/dL (ref 0.0–1.0)
pH: 7 (ref 5.0–8.0)
pH: 5.5 (ref 5.0–8.0)
pH: 6 (ref 5.0–8.0)

## 2011-03-15 LAB — PROTIME-INR
INR: 2.06 — ABNORMAL HIGH (ref 0.00–1.49)
INR: 2.23 — ABNORMAL HIGH (ref 0.00–1.49)
INR: 2.26 — ABNORMAL HIGH (ref 0.00–1.49)
INR: 2.35 — ABNORMAL HIGH (ref 0.00–1.49)
INR: 2.75 — ABNORMAL HIGH (ref 0.00–1.49)
INR: 3 — ABNORMAL HIGH (ref 0.00–1.49)
Prothrombin Time: 23 seconds — ABNORMAL HIGH (ref 11.6–15.2)
Prothrombin Time: 22.5 seconds — ABNORMAL HIGH (ref 11.6–15.2)
Prothrombin Time: 24.5 seconds — ABNORMAL HIGH (ref 11.6–15.2)
Prothrombin Time: 30.9 seconds — ABNORMAL HIGH (ref 11.6–15.2)

## 2011-03-15 LAB — URINALYSIS, MICROSCOPIC ONLY
Nitrite: NEGATIVE
Specific Gravity, Urine: 1.013 (ref 1.005–1.030)
pH: 5.5 (ref 5.0–8.0)

## 2011-03-15 LAB — LUPUS ANTICOAGULANT PANEL
Lupus Anticoagulant: DETECTED — AB
PTTLA 4:1 Mix: 52 secs — ABNORMAL HIGH (ref 36.3–48.8)
PTTLA Confirmation: 9.7 secs — ABNORMAL HIGH (ref <8.0)
dRVVT Incubated 1:1 Mix: 42.8 secs (ref 36.2–44.3)

## 2011-03-15 LAB — PREPARE RBC (CROSSMATCH)

## 2011-03-15 LAB — APTT
aPTT: 46 seconds — ABNORMAL HIGH (ref 24–37)
aPTT: 48 seconds — ABNORMAL HIGH (ref 24–37)

## 2011-03-15 LAB — CULTURE, BLOOD (ROUTINE X 2): Culture: NO GROWTH

## 2011-03-15 LAB — URINE CULTURE: Colony Count: NO GROWTH

## 2011-03-15 LAB — VITAMIN B12: Vitamin B-12: >2000 pg/mL — ABNORMAL HIGH (ref 211–911)

## 2011-03-15 LAB — RETICULOCYTES: Retic Count, Absolute: 14.8 10*3/uL — ABNORMAL LOW (ref 19.0–186.0)

## 2011-03-15 LAB — IRON AND TIBC
Iron: 90 ug/dL (ref 42–135)
TIBC: 160 ug/dL — ABNORMAL LOW (ref 215–435)

## 2011-03-15 LAB — HEMOCCULT GUIAC POC 1CARD (OFFICE)
Fecal Occult Bld: POSITIVE
Fecal Occult Bld: POSITIVE

## 2011-03-15 LAB — PROTEIN S ACTIVITY: Protein S Activity: 87 % (ref 69–129)

## 2011-03-15 LAB — FACTOR 5 LEIDEN

## 2011-03-15 LAB — ALBUMIN, FLUID (OTHER)

## 2011-03-15 LAB — DIC (DISSEMINATED INTRAVASCULAR COAGULATION)PANEL
Fibrinogen: 307 mg/dL (ref 204–475)
Platelets: 77 10*3/uL — ABNORMAL LOW (ref 150–400)
Prothrombin Time: 22.8 seconds — ABNORMAL HIGH (ref 11.6–15.2)

## 2011-03-15 LAB — PROTEIN S, TOTAL: Protein S Ag, Total: 102 % (ref 70–140)

## 2011-03-15 LAB — BETA-2-GLYCOPROTEIN I ABS, IGG/M/A: Beta-2-Glycoprotein I IgA: 50 A Units — ABNORMAL HIGH (ref <20)

## 2011-03-15 LAB — HOMOCYSTEINE: Homocysteine: 30.9 umol/L — ABNORMAL HIGH (ref 4.0–15.4)

## 2011-03-15 LAB — PROTEIN C ACTIVITY: Protein C Activity: 43 % — ABNORMAL LOW (ref 75–133)

## 2011-03-16 LAB — COMPREHENSIVE METABOLIC PANEL
ALT: 30 U/L (ref 0–53)
AST: 114 U/L — ABNORMAL HIGH (ref 0–37)
AST: 127 U/L — ABNORMAL HIGH (ref 0–37)
Albumin: 1.6 g/dL — ABNORMAL LOW (ref 3.5–5.2)
Albumin: 1.7 g/dL — ABNORMAL LOW (ref 3.5–5.2)
Alkaline Phosphatase: 143 U/L — ABNORMAL HIGH (ref 39–117)
Alkaline Phosphatase: 148 U/L — ABNORMAL HIGH (ref 39–117)
CO2: 20 mEq/L (ref 19–32)
Calcium: 8.2 mg/dL — ABNORMAL LOW (ref 8.4–10.5)
Calcium: 8.4 mg/dL (ref 8.4–10.5)
Chloride: 100 mEq/L (ref 96–112)
Chloride: 105 mEq/L (ref 96–112)
Creatinine, Ser: 1.29 mg/dL (ref 0.4–1.5)
Creatinine, Ser: 1.39 mg/dL (ref 0.4–1.5)
GFR calc Af Amer: 53 mL/min — ABNORMAL LOW (ref >60)
GFR calc Af Amer: >60 mL/min (ref >60)
GFR calc Af Amer: >60 mL/min (ref >60)
GFR calc non Af Amer: 59 mL/min — ABNORMAL LOW (ref >60)
Glucose, Bld: 59 mg/dL — ABNORMAL LOW (ref 70–99)
Glucose, Bld: 71 mg/dL (ref 70–99)
Potassium: 3.4 mEq/L — ABNORMAL LOW (ref 3.5–5.1)
Potassium: 3.4 mEq/L — ABNORMAL LOW (ref 3.5–5.1)
Sodium: 130 mEq/L — ABNORMAL LOW (ref 135–145)
Sodium: 132 mEq/L — ABNORMAL LOW (ref 135–145)
Sodium: 133 mEq/L — ABNORMAL LOW (ref 135–145)
Total Bilirubin: 25.9 mg/dL (ref 0.3–1.2)
Total Bilirubin: 27.7 mg/dL (ref 0.3–1.2)
Total Protein: 6.8 g/dL (ref 6.0–8.3)

## 2011-03-16 LAB — CBC
HCT: 21.6 % — ABNORMAL LOW (ref 39.0–52.0)
Hemoglobin: 7.6 g/dL — ABNORMAL LOW (ref 13.0–17.0)
Hemoglobin: 7.8 g/dL — ABNORMAL LOW (ref 13.0–17.0)
MCHC: 35.3 g/dL (ref 30.0–36.0)
MCHC: 35.7 g/dL (ref 30.0–36.0)
MCV: 108.8 fL — ABNORMAL HIGH (ref 78.0–100.0)
Platelets: 193 10*3/uL (ref 150–400)
Platelets: 206 10*3/uL (ref 150–400)
RBC: 1.99 MIL/uL — ABNORMAL LOW (ref 4.22–5.81)
RDW: 18.5 % — ABNORMAL HIGH (ref 11.5–15.5)
RDW: 18.6 % — ABNORMAL HIGH (ref 11.5–15.5)
WBC: 10.7 10*3/uL — ABNORMAL HIGH (ref 4.0–10.5)
WBC: 8.4 10*3/uL (ref 4.0–10.5)

## 2011-03-16 LAB — APTT
aPTT: 45 seconds — ABNORMAL HIGH (ref 24–37)
aPTT: 45 seconds — ABNORMAL HIGH (ref 24–37)

## 2011-03-16 LAB — MAGNESIUM: Magnesium: 2.1 mg/dL (ref 1.5–2.5)

## 2011-03-16 LAB — PROTIME-INR
INR: 2.24 — ABNORMAL HIGH (ref 0.00–1.49)
Prothrombin Time: 24.6 seconds — ABNORMAL HIGH (ref 11.6–15.2)
Prothrombin Time: 25.5 seconds — ABNORMAL HIGH (ref 11.6–15.2)

## 2011-03-16 NOTE — Telephone Encounter (Signed)
Refill request

## 2011-03-21 LAB — COMPREHENSIVE METABOLIC PANEL
ALT: 25 U/L (ref 0–53)
ALT: 27 U/L (ref 0–53)
AST: 102 U/L — ABNORMAL HIGH (ref 0–37)
AST: 93 U/L — ABNORMAL HIGH (ref 0–37)
Albumin: 1.6 g/dL — ABNORMAL LOW (ref 3.5–5.2)
Albumin: 1.7 g/dL — ABNORMAL LOW (ref 3.5–5.2)
Alkaline Phosphatase: 150 U/L — ABNORMAL HIGH (ref 39–117)
Alkaline Phosphatase: 169 U/L — ABNORMAL HIGH (ref 39–117)
CO2: 22 mEq/L (ref 19–32)
CO2: 25 mEq/L (ref 19–32)
Calcium: 8 mg/dL — ABNORMAL LOW (ref 8.4–10.5)
Calcium: 8.2 mg/dL — ABNORMAL LOW (ref 8.4–10.5)
Chloride: 98 mEq/L (ref 96–112)
Creatinine, Ser: 0.82 mg/dL (ref 0.4–1.5)
Creatinine, Ser: 1.12 mg/dL (ref 0.4–1.5)
GFR calc Af Amer: 53 mL/min — ABNORMAL LOW (ref >60)
GFR calc Af Amer: 56 mL/min — ABNORMAL LOW (ref >60)
GFR calc non Af Amer: >60 mL/min (ref >60)
GFR calc non Af Amer: >60 mL/min (ref >60)
Glucose, Bld: 68 mg/dL — ABNORMAL LOW (ref 70–99)
Glucose, Bld: 72 mg/dL (ref 70–99)
Glucose, Bld: 81 mg/dL (ref 70–99)
Potassium: 3.3 mEq/L — ABNORMAL LOW (ref 3.5–5.1)
Potassium: 3.4 mEq/L — ABNORMAL LOW (ref 3.5–5.1)
Sodium: 132 mEq/L — ABNORMAL LOW (ref 135–145)
Total Bilirubin: 22.7 mg/dL (ref 0.3–1.2)
Total Protein: 6.1 g/dL (ref 6.0–8.3)
Total Protein: 6.4 g/dL (ref 6.0–8.3)
Total Protein: 8.2 g/dL (ref 6.0–8.3)

## 2011-03-21 LAB — CBC
HCT: 23.2 % — ABNORMAL LOW (ref 39.0–52.0)
HCT: 24.1 % — ABNORMAL LOW (ref 39.0–52.0)
Hemoglobin: 7.7 g/dL — ABNORMAL LOW (ref 13.0–17.0)
Hemoglobin: 7.7 g/dL — ABNORMAL LOW (ref 13.0–17.0)
Hemoglobin: 8.2 g/dL — ABNORMAL LOW (ref 13.0–17.0)
Hemoglobin: 9.4 g/dL — ABNORMAL LOW (ref 13.0–17.0)
MCHC: 35.3 g/dL (ref 30.0–36.0)
MCHC: 35.4 g/dL (ref 30.0–36.0)
MCHC: 35.4 g/dL (ref 30.0–36.0)
MCV: 106.6 fL — ABNORMAL HIGH (ref 78.0–100.0)
MCV: 106.9 fL — ABNORMAL HIGH (ref 78.0–100.0)
MCV: 107.3 fL — ABNORMAL HIGH (ref 78.0–100.0)
MCV: 107.5 fL — ABNORMAL HIGH (ref 78.0–100.0)
MCV: 108.2 fL — ABNORMAL HIGH (ref 78.0–100.0)
Platelets: 145 10*3/uL — ABNORMAL LOW (ref 150–400)
Platelets: 163 10*3/uL (ref 150–400)
RBC: 2.02 MIL/uL — ABNORMAL LOW (ref 4.22–5.81)
RBC: 2.16 MIL/uL — ABNORMAL LOW (ref 4.22–5.81)
RBC: 2.21 MIL/uL — ABNORMAL LOW (ref 4.22–5.81)
RBC: 2.24 MIL/uL — ABNORMAL LOW (ref 4.22–5.81)
RBC: 2.49 MIL/uL — ABNORMAL LOW (ref 4.22–5.81)
RDW: 17.2 % — ABNORMAL HIGH (ref 11.5–15.5)
RDW: 18.5 % — ABNORMAL HIGH (ref 11.5–15.5)
RDW: 19 % — ABNORMAL HIGH (ref 11.5–15.5)
RDW: 19.2 % — ABNORMAL HIGH (ref 11.5–15.5)
WBC: 7.5 10*3/uL (ref 4.0–10.5)
WBC: 8.4 10*3/uL (ref 4.0–10.5)
WBC: 8.9 10*3/uL (ref 4.0–10.5)

## 2011-03-21 LAB — HEPATIC FUNCTION PANEL
ALT: 38 U/L (ref 0–53)
AST: 183 U/L — ABNORMAL HIGH (ref 0–37)
Albumin: 2.3 g/dL — ABNORMAL LOW (ref 3.5–5.2)
Alkaline Phosphatase: 279 U/L — ABNORMAL HIGH (ref 39–117)
Total Protein: 8 g/dL (ref 6.0–8.3)

## 2011-03-21 LAB — URINE CULTURE

## 2011-03-21 LAB — BASIC METABOLIC PANEL
Calcium: 7.9 mg/dL — ABNORMAL LOW (ref 8.4–10.5)
Chloride: 90 mEq/L — ABNORMAL LOW (ref 96–112)
Chloride: 92 mEq/L — ABNORMAL LOW (ref 96–112)
Creatinine, Ser: 0.79 mg/dL (ref 0.4–1.5)
GFR calc Af Amer: >60 mL/min (ref >60)
GFR calc Af Amer: >60 mL/min (ref >60)
Potassium: 3.8 mEq/L (ref 3.5–5.1)
Sodium: 128 mEq/L — ABNORMAL LOW (ref 135–145)

## 2011-03-21 LAB — HEMOCCULT GUIAC POC 1CARD (OFFICE): Fecal Occult Bld: POSITIVE

## 2011-03-21 LAB — PREPARE FRESH FROZEN PLASMA

## 2011-03-21 LAB — RETICULOCYTES: Retic Count, Absolute: 26.9 10*3/uL (ref 19.0–186.0)

## 2011-03-21 LAB — HEPATITIS B SURFACE ANTIGEN: Hepatitis B Surface Ag: NEGATIVE

## 2011-03-21 LAB — URINE MICROSCOPIC-ADD ON

## 2011-03-21 LAB — RAPID URINE DRUG SCREEN, HOSP PERFORMED
Barbiturates: NOT DETECTED
Benzodiazepines: NOT DETECTED
Cocaine: NOT DETECTED

## 2011-03-21 LAB — DIFFERENTIAL
Lymphocytes Relative: 24 % (ref 12–46)
Lymphs Abs: 2.1 10*3/uL (ref 0.7–4.0)
Monocytes Relative: 6 % (ref 3–12)
Neutro Abs: 6.1 10*3/uL (ref 1.7–7.7)
Neutrophils Relative %: 70 % (ref 43–77)

## 2011-03-21 LAB — URINALYSIS, ROUTINE W REFLEX MICROSCOPIC
Hgb urine dipstick: NEGATIVE
Protein, ur: 30 mg/dL — AB
Urobilinogen, UA: 1 mg/dL (ref 0.0–1.0)

## 2011-03-21 LAB — APTT: aPTT: 44 seconds — ABNORMAL HIGH (ref 24–37)

## 2011-03-21 LAB — ABO/RH: ABO/RH(D): B POS

## 2011-03-21 LAB — ACETAMINOPHEN LEVEL: Acetaminophen (Tylenol), Serum: <10 ug/mL — ABNORMAL LOW (ref 10–30)

## 2011-03-21 LAB — HEPATITIS PANEL, ACUTE
Hep A IgM: NEGATIVE
Hep B C IgM: NEGATIVE

## 2011-03-21 LAB — IRON AND TIBC
Iron: 70 ug/dL (ref 42–135)
UIBC: 58 ug/dL

## 2011-03-21 LAB — TSH: TSH: 6.069 u[IU]/mL — ABNORMAL HIGH (ref 0.350–4.500)

## 2011-03-21 LAB — PROTIME-INR: INR: 2.23 — ABNORMAL HIGH (ref 0.00–1.49)

## 2011-03-21 LAB — OSMOLALITY: Osmolality: 255 mOsm/kg — ABNORMAL LOW (ref 275–300)

## 2011-03-21 LAB — CLOSTRIDIUM DIFFICILE EIA: C difficile Toxins A+B, EIA: NEGATIVE

## 2011-03-21 LAB — HEMOGLOBIN AND HEMATOCRIT, BLOOD
HCT: 23.6 % — ABNORMAL LOW (ref 39.0–52.0)
Hemoglobin: 8.3 g/dL — ABNORMAL LOW (ref 13.0–17.0)

## 2011-03-21 LAB — LIPASE, BLOOD: Lipase: 26 U/L (ref 11–59)

## 2011-03-24 ENCOUNTER — Telehealth: Payer: Self-pay | Admitting: Family Medicine

## 2011-03-24 NOTE — Telephone Encounter (Signed)
Mr. Gadbois can no longer afford the Lactulose due to his medicaid out of service.  Call Nash Mantis at clinic on  [facility] to authorize receipt of Miralax instead.  Number in the contact field of encounter.

## 2011-03-28 MED ORDER — POLYETHYLENE GLYCOL 3350 17 G PO PACK: 17.0000 g | PACK | Freq: Two times a day (BID) | ORAL | Status: DC

## 2011-03-28 NOTE — Telephone Encounter (Signed)
Called in Miralax for pt would encourage pt to get coverage because lactulose would be better.

## 2011-04-12 ENCOUNTER — Encounter: Payer: Self-pay | Admitting: Family Medicine

## 2011-04-12 ENCOUNTER — Ambulatory Visit (INDEPENDENT_AMBULATORY_CARE_PROVIDER_SITE_OTHER): Payer: Self-pay | Admitting: Family Medicine

## 2011-04-12 DIAGNOSIS — K7460 Unspecified cirrhosis of liver: Secondary | ICD-10-CM

## 2011-04-12 DIAGNOSIS — M25569 Pain in unspecified knee: Secondary | ICD-10-CM

## 2011-04-12 DIAGNOSIS — M25512 Pain in left shoulder: Secondary | ICD-10-CM

## 2011-04-12 DIAGNOSIS — R030 Elevated blood-pressure reading, without diagnosis of hypertension: Secondary | ICD-10-CM

## 2011-04-12 DIAGNOSIS — M25519 Pain in unspecified shoulder: Secondary | ICD-10-CM

## 2011-04-12 MED ORDER — ESOMEPRAZOLE MAGNESIUM 40 MG PO CPDR: 40.0000 mg | DELAYED_RELEASE_CAPSULE | Freq: Every day | ORAL | Status: DC

## 2011-04-12 MED ORDER — NADOLOL 20 MG PO TABS: 20.0000 mg | ORAL_TABLET | Freq: Every day | ORAL | Status: DC

## 2011-04-12 MED ORDER — IBUPROFEN 600 MG PO TABS: 600.0000 mg | ORAL_TABLET | Freq: Three times a day (TID) | ORAL | Status: AC | PRN

## 2011-04-12 MED ORDER — POLYETHYLENE GLYCOL 3350 17 GM/SCOOP PO POWD: 17.0000 g | Freq: Three times a day (TID) | ORAL | Status: DC

## 2011-04-12 NOTE — Patient Instructions (Signed)
I am glad you are doing better Try motrin twice a day for then next five days to really help get rid of the inflammation I am giving you new medication to take to the health department.  If your knees start to bother you please come back and we will do some injections.

## 2011-04-12 NOTE — Assessment & Plan Note (Signed)
Pain has improved no red flags, pt given choice of some injections will have pt hold off until motrin not helping, will have him do another burst of motrin, avoid medications that go through the liver.

## 2011-04-12 NOTE — Progress Notes (Signed)
  Subjective:    Patient ID: William [name], male    DOB: Apr 30, 1961, 50 y.o.   MRN: [id number]  HPI   Pt states  1.  Shoulder pain Side:left Duration:2 months and since started the exercises has improved  Injury?: no What aggravates it: certain movements but don't know which ones Meds:motrin as needed.  ROS: Denies Muscle weakness, numbness, swelling, radiating pain.   2.  Knee Pain Side:bilateral Injury:none laid concrete though for 30 years Duration: 2 months but has improved since last visit.   Pt able to do all ADL's ROS:  Denies knee giving out, swelling, redness, loss of sensation or muscle weakness.   Hepatic failure-  Pt unable to afford lactulose talked to pharmacy and switched to miralax no confusion, no change in skin color.   Review of Systems     see above Objective:   Physical Exam  General Appearance:    Alert, cooperative, no distress, appears stated age  Throat:   Lips, mucosa, and tongue normal; teeth and gums normal        Lungs:     Clear to auscultation bilaterally, respirations unlabored      Heart:    Regular rate and rhythm, S1 and S2 normal, no murmur, rub   or gallop     Abdomen:     Soft, non-tender, bowel sounds active all four quadrants,    no masses, no organomegaly no ascites  MSK: Left shoulder: negative hawkins and neers, decrease ROM actively  full ROM passively. Negative empty can, +crepitus heard throughout exam Knees: no swelling, mild patellar tracking laterally bilateral but improved.  Anterior, posterior, lateral and medially ligament intact.   Neg murrys test. Distally pulses and sensation intact.   Extremities:   Extremities normal, atraumatic, no cyanosis trace edema bilaterally  Pulses:   2+ and symmetric all extremities  Skin:   Skin color, texture, turgor normal, no rashes or lesions  Neurologic:   CNII-XII intact, normal strength, sensation and reflexes    throughout          Assessment & Plan:

## 2011-04-12 NOTE — Assessment & Plan Note (Signed)
Improved, continue exercises and will have pt follow up again if needed.

## 2011-04-12 NOTE — Assessment & Plan Note (Signed)
Blood pressure still borderline, pt would like to hold off at moment for treatment, on lasix for swelling seems to be doing well.

## 2011-04-12 NOTE — Assessment & Plan Note (Signed)
Pt seems to be doing well unable to afford lactulose will attempt miralax at this time, will dose three times daily told pt of red flags to look out for. Will monitor.

## 2011-06-01 ENCOUNTER — Ambulatory Visit: Payer: Self-pay | Admitting: Family Medicine

## 2011-06-07 ENCOUNTER — Telehealth: Payer: Self-pay | Admitting: Family Medicine

## 2011-06-07 ENCOUNTER — Other Ambulatory Visit: Payer: Self-pay | Admitting: Family Medicine

## 2011-06-07 MED ORDER — FOLIC ACID 1 MG PO TABS: 1.0000 mg | ORAL_TABLET | Freq: Every day | ORAL | Status: DC

## 2011-06-07 NOTE — Telephone Encounter (Signed)
[facility] [address] for folic acid refill.  Will be a new rx to them. Please contact for refill request

## 2011-06-07 NOTE — Telephone Encounter (Signed)
Done

## 2011-06-17 ENCOUNTER — Ambulatory Visit: Payer: Self-pay | Admitting: Family Medicine

## 2011-06-20 ENCOUNTER — Ambulatory Visit (INDEPENDENT_AMBULATORY_CARE_PROVIDER_SITE_OTHER): Payer: Self-pay | Admitting: Family Medicine

## 2011-06-20 ENCOUNTER — Encounter: Payer: Self-pay | Admitting: Family Medicine

## 2011-06-20 DIAGNOSIS — K7460 Unspecified cirrhosis of liver: Secondary | ICD-10-CM

## 2011-06-20 DIAGNOSIS — D594 Other nonautoimmune hemolytic anemias: Secondary | ICD-10-CM

## 2011-06-20 DIAGNOSIS — K7200 Acute and subacute hepatic failure without coma: Secondary | ICD-10-CM

## 2011-06-20 LAB — COMPREHENSIVE METABOLIC PANEL
AST: 41 U/L — ABNORMAL HIGH (ref 0–37)
Albumin: 4.4 g/dL (ref 3.5–5.2)
Alkaline Phosphatase: 163 U/L — ABNORMAL HIGH (ref 39–117)
BUN: 32 mg/dL — ABNORMAL HIGH (ref 6–23)
Creat: 1.75 mg/dL — ABNORMAL HIGH (ref 0.50–1.35)
Glucose, Bld: 99 mg/dL (ref 70–99)
Potassium: 4.1 mEq/L (ref 3.5–5.3)
Total Bilirubin: 0.9 mg/dL (ref 0.3–1.2)

## 2011-06-20 LAB — CBC
HCT: 29.3 % — ABNORMAL LOW (ref 39.0–52.0)
Hemoglobin: 9.4 g/dL — ABNORMAL LOW (ref 13.0–17.0)
MCH: 26.5 pg (ref 26.0–34.0)
MCHC: 32.1 g/dL (ref 30.0–36.0)
MCV: 82.5 fL (ref 78.0–100.0)
RBC: 3.55 MIL/uL — ABNORMAL LOW (ref 4.22–5.81)

## 2011-06-20 MED ORDER — MELOXICAM 7.5 MG PO TABS: 7.5000 mg | ORAL_TABLET | Freq: Every day | ORAL | Status: DC

## 2011-06-20 NOTE — Patient Instructions (Signed)
I am giving you a new pill for your arthritis.  Take 1 pill daily it is called mobic.  For your trigger finger, as long as it doesn't hurt or get stuck much we will wait.  If it starts to give you problems then return and we will try an injection. Increase your miralax to three times daily until you have regular bowel movements We will check labs today, no news is good news, otherwise I will call you if we need to change anything.  Your blood pressure is a little better so continue to try to exercise and add more fruits and vegetables in your diet.  I should see you again in 3 months at the longest.

## 2011-06-20 NOTE — Assessment & Plan Note (Signed)
meloxicam stop motrin f/u in 1 month.

## 2011-06-20 NOTE — Progress Notes (Signed)
  Subjective:    Patient ID: William [name], male    DOB: Dec 05, 1961, 50 y.o.   MRN: [id number]  HPI   Pt states  Pain he was having in shoulder and back has been much better and he is working now which has been stressful but doing well.  Pt though needs to get new shoes and know it will help.  Still taking motrin on daily basis and stays away from tylenol.    .   Hepatic failure-  Pt unable to afford lactulose on Miralax but was only doing once aday but has been more constipated, overall though no confusion, no trouble with thought process. Review of Systems pt has slipped up on drinking about 1-2 times a week. Pt though states only has one to two beers on occasion.    Objective:   Physical Exam  General Appearance:    Alert, cooperative, no distress, appears stated age  Throat:   Lips, mucosa, and tongue normal; teeth and gums normal        Lungs:     Clear to auscultation bilaterally, respirations unlabored      Heart:    Regular rate and rhythm, S1 and S2 normal, no murmur, rub   or gallop     Abdomen:     Soft, non-tender, bowel sounds active all four quadrants,    no masses, no organomegaly no ascites     Extremities:   Extremities normal, atraumatic, no cyanosis trace edema bilaterally  Pulses:   2+ and symmetric all extremities  Skin:   Skin color, texture, turgor normal, no rashes or lesions  Neurologic:   CNII-XII intact, normal strength, sensation and reflexes    throughout          Assessment & Plan:

## 2011-06-20 NOTE — Assessment & Plan Note (Signed)
Doing well will get labs and see how pt is doing.

## 2011-07-08 ENCOUNTER — Other Ambulatory Visit: Payer: Self-pay | Admitting: Family Medicine

## 2011-07-08 ENCOUNTER — Telehealth: Payer: Self-pay | Admitting: Family Medicine

## 2011-07-08 MED ORDER — HYDROXYZINE HCL 25 MG PO TABS: 25.0000 mg | ORAL_TABLET | Freq: Four times a day (QID) | ORAL | Status: DC | PRN

## 2011-07-08 MED ORDER — SODIUM BICARBONATE 650 MG PO TABS: 650.0000 mg | ORAL_TABLET | Freq: Two times a day (BID) | ORAL | Status: DC

## 2011-07-08 NOTE — Telephone Encounter (Signed)
Pt calling re: meds he has been waiting on since Monday, says [facility] has faxed several times, pt needs sodium bicarbonate and hydroxyzine.

## 2011-07-08 NOTE — Telephone Encounter (Signed)
Left message on voicemail informing patient.  

## 2011-07-08 NOTE — Telephone Encounter (Signed)
No copy of fax requests exists.  Both RX sent electronically. Please call patient back and let them know they are available.

## 2011-07-25 IMAGING — NM NM LIVER FUNCTION STUDY
1 series · 6 of 6 positions shown · non-contrast
Comparison: CT 03/20/2010

CLINICAL DATA: Severe jaundice, alcohol abuse.

NUCLEAR MEDICINE HEPATOBILIARY IMAGING
TECHNIQUE: Sequential images of the abdomen were obtained [DATE] minutes following intravenous administration of
radiopharmaceutical.
Radiopharmaceutical:  10 mCi Nc-22m Choletec

[he hepatobiliary · 3.43mm/px · 6 of 15 frames shown]
[frame 2/15  full-range]
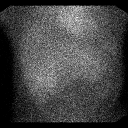
[frame 4/15  full-range]
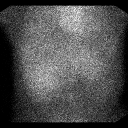
[frame 7/15  full-range]
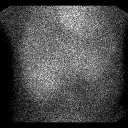
[frame 9/15  full-range]
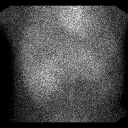
[frame 12/15  full-range]
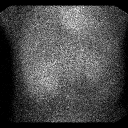
[frame 14/15  full-range]
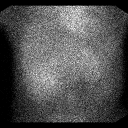

[6 of 6 positions shown; findings below may reference images not displayed]

FINDINGS: There is extremely poor clearance of the radiotracer from
the blood pool. There is persistent blood pool activity at 5 hours
(activity typically clear  in several minutes with normal liver
function).  There is homogeneous uptake within an enlarged liver.
No evidence of biliary excretion at 5 hours.  The patient returned
at 24 hours postinjection  and there is faint activity within the
bowel.
IMPRESSION: 1.  Unlikely  biliary obstruction with small amount of bowel
activity noted on the 24 hour   images.
2.  Severe hepatic dysfunction with extremely poor clearance of
radiotracer from the blood pool.

## 2011-08-11 ENCOUNTER — Encounter: Payer: Self-pay | Admitting: Family Medicine

## 2011-08-11 ENCOUNTER — Ambulatory Visit (INDEPENDENT_AMBULATORY_CARE_PROVIDER_SITE_OTHER): Payer: Self-pay | Admitting: Family Medicine

## 2011-08-11 DIAGNOSIS — M549 Dorsalgia, unspecified: Secondary | ICD-10-CM

## 2011-08-11 DIAGNOSIS — R030 Elevated blood-pressure reading, without diagnosis of hypertension: Secondary | ICD-10-CM

## 2011-08-11 DIAGNOSIS — K7030 Alcoholic cirrhosis of liver without ascites: Secondary | ICD-10-CM

## 2011-08-11 MED ORDER — GABAPENTIN 100 MG PO CAPS: 200.0000 mg | ORAL_CAPSULE | Freq: Every day | ORAL | Status: DC

## 2011-08-11 MED ORDER — MAGNESIUM OXIDE 400 MG PO TABS: 400.0000 mg | ORAL_TABLET | Freq: Every day | ORAL | Status: DC

## 2011-08-11 NOTE — Assessment & Plan Note (Signed)
Pt has hx of fracture. Pt though has not done the exercises.  Continue mobic concern mildly for nerve compression will also treat with neurontin, keep low dose due to liver dx.   Will have pt back in 6 weeks and at that time if still having pain then will do formal PT. No red flags at this time.

## 2011-08-11 NOTE — Patient Instructions (Addendum)
I am giving you some back exercises to do today.  Try them for the next 6 weeks I want you to start taking some magnesium to help with the muscle spasms.  I am giving you another pill for you back pain I want you to try called gabapentin.  Start with one pill at night for first week then 2 pills nightly thereafter I want to see you again in 6-8 weeks.

## 2011-08-11 NOTE — Progress Notes (Signed)
  Subjective:    Patient ID: William [name], male    DOB: 15-Oct-1961, 50 y.o.   MRN: [id number]  Hypertension This is a chronic problem. The current episode started more than 1 year ago. The problem is unchanged. The problem is controlled. Pertinent negatives include no anxiety, blurred vision, chest pain, headaches, malaise/fatigue, neck pain, orthopnea, palpitations or shortness of breath. Agents associated with hypertension include NSAIDs. Risk factors for coronary artery disease include dyslipidemia, family history, male gender, obesity and sedentary lifestyle. Past treatments include beta blockers. The current treatment provides moderate improvement. Compliance problems include diet and exercise.  There is no history of kidney disease, CAD/MI, CVA, heart failure or PVD. There is no history of a hypertension causing med.    Back pain-   Pt was started on Mobic and started to have some problems. Pt states he has shooting pain sometimes usually when bending over to pick something up.  States able to walk without much problem but at the end of the day though feels tight. Denies fever, hematuria no radiation down his legs, hx of back fracture from fall seen on MRI of L1,2,3.   Pt has trigger finger bilaterally, still doing well no problems that he can't handle.   Hepatic failure-  Pt unable to afford lactulose on Miralax but was only doing once aday but has been more constipated, overall though no confusion, no trouble with thought process. Review of Systems  Constitutional: Negative for malaise/fatigue.  HENT: Negative for neck pain.   Eyes: Negative for blurred vision.  Respiratory: Negative for shortness of breath.   Cardiovascular: Negative for chest pain, palpitations and orthopnea.  Neurological: Negative for headaches.  pt has slipped up on drinking about 1-2 times a week. Pt though states only has one to two beers on occasion. Been better last couple of weeks.    Objective:   Physical  Exam  General Appearance:    Alert, cooperative, no distress, appears stated age  Throat:   Lips, mucosa, and tongue normal; teeth and gums normal  Lungs:     Clear to auscultation bilaterally, respirations unlabored      Heart:    Regular rate and rhythm, S1 and S2 normal, no murmur, rub   or gallop  Abdomen:     Soft, non-tender, bowel sounds active all four quadrants,    no masses, no organomegaly no ascites  Extremities:   Extremities normal, atraumatic, no cyanosis trace edema bilaterally  Pulses:   2+ and symmetric all extremities  Skin:   Skin color, texture, turgor normal, no rashes or lesions  Neurologic:   CNII-XII intact, normal strength, sensation and reflexes    throughout          Assessment & Plan:

## 2011-08-11 NOTE — Assessment & Plan Note (Signed)
Pt doing well should get LFTs at next visit in 2 months.  Had elevated ALP last time but still well compensated.

## 2011-08-11 NOTE — Assessment & Plan Note (Signed)
Still elevated, but on recheck improved, with all meds will continue to monitor and not treat may need to increase nadolol.

## 2011-09-19 ENCOUNTER — Ambulatory Visit: Payer: Self-pay | Admitting: Family Medicine

## 2011-09-28 ENCOUNTER — Other Ambulatory Visit: Payer: Self-pay | Admitting: Family Medicine

## 2011-09-28 NOTE — Telephone Encounter (Signed)
Refill request

## 2011-11-03 ENCOUNTER — Other Ambulatory Visit: Payer: Self-pay | Admitting: Family Medicine

## 2011-11-03 NOTE — Telephone Encounter (Signed)
Refill request

## 2011-12-08 ENCOUNTER — Ambulatory Visit: Payer: Self-pay | Admitting: Family Medicine

## 2011-12-12 ENCOUNTER — Ambulatory Visit: Payer: Self-pay | Admitting: Family Medicine

## 2011-12-13 ENCOUNTER — Ambulatory Visit (INDEPENDENT_AMBULATORY_CARE_PROVIDER_SITE_OTHER): Payer: Self-pay | Admitting: Family Medicine

## 2011-12-13 ENCOUNTER — Encounter: Payer: Self-pay | Admitting: Family Medicine

## 2011-12-13 VITALS — BP 108/82 | HR 99 | Temp 98.1°F | Ht 70.0 in | Wt 200.0 lb

## 2011-12-13 DIAGNOSIS — M549 Dorsalgia, unspecified: Secondary | ICD-10-CM

## 2011-12-13 DIAGNOSIS — N529 Male erectile dysfunction, unspecified: Secondary | ICD-10-CM

## 2011-12-13 DIAGNOSIS — N183 Chronic kidney disease, stage 3 unspecified: Secondary | ICD-10-CM

## 2011-12-13 LAB — COMPREHENSIVE METABOLIC PANEL
Alkaline Phosphatase: 197 U/L — ABNORMAL HIGH (ref 39–117)
BUN: 15 mg/dL (ref 6–23)
CO2: 25 mEq/L (ref 19–32)
Creat: 1.42 mg/dL — ABNORMAL HIGH (ref 0.50–1.35)
Glucose, Bld: 100 mg/dL — ABNORMAL HIGH (ref 70–99)
Total Bilirubin: 1.1 mg/dL (ref 0.3–1.2)
Total Protein: 8 g/dL (ref 6.0–8.3)

## 2011-12-13 MED ORDER — SILDENAFIL CITRATE 25 MG PO TABS: 25.0000 mg | ORAL_TABLET | Freq: Every day | ORAL | Status: AC | PRN

## 2011-12-13 MED ORDER — FUROSEMIDE 20 MG PO TABS: 10.0000 mg | ORAL_TABLET | Freq: Every day | ORAL | Status: DC

## 2011-12-13 MED ORDER — PREDNISONE 20 MG PO TABS: 40.0000 mg | ORAL_TABLET | Freq: Every day | ORAL | Status: AC

## 2011-12-13 NOTE — Assessment & Plan Note (Signed)
Discussed with patient today. Patient has not had many heart risk factors. We'll do a trial of a low-dose Viagra. See how patient does.

## 2011-12-13 NOTE — Progress Notes (Signed)
  Subjective:    Patient ID: RAYDELL MANERS, male    DOB: 1961/05/30, 50 y.o.   MRN: [id number]  HPI 50 year old male coming in with a complaint of lower back pain. Patient does have a history of having a compression fracture lumbar vertebrae. Patient states the pain started about a week ago happened when he was trying to reach for something. States that he tried to get up and started having pain right-sided no radiculopathy. Patient denies any bowel or bladder problems denies any radiation of pain or any weakness in the leg. Patient states since that time the patient has started again a little improved. Patient was given some Flexeril at an urgent care center as well as oxycodone which did seem to help. Patient does complain of some erectile dysfunction but he has had for some time and does not think this is related.  Review of Systems She denies fever, chills, abdominal pain, hematuria, or trouble with bowel movements.    Objective:   Physical Exam Gen: No apparent distress Back Exam: Inspection: No gross deformity Motion: Patient is moderately impingement flexion. SLR seated:         Negative                 SLR lying: Negative XSLR seated: Negative                       XSLR lying: Negative Seated HS Flexibility: Mild tightness bilaterally Palpable tenderness: Lumbar paraspinal right-sided FABER: Negative Sensory change: Neurovascular intact bilaterally Reflex change: 2+ bilaterally  Strength out of 5 in all extremities Genital exam patient has no inguinal hernias neurovascularly intact no swelling no variceal no umbilical hernia either.    Assessment & Plan:

## 2011-12-13 NOTE — Assessment & Plan Note (Signed)
Patient's creatinine has been elevating slowly. Patient is on meloxicam as well as Lasix. We will have his Lasix right now recheck lab values and if still elevated we need to stop his meloxicam and try other pain relievers. Patient has tried tramadol in the past and did not like it.

## 2011-12-13 NOTE — Patient Instructions (Signed)
It is good to see you. We will do prednisone for your back pain for the next 5 days. I'm going to get some labs today to make sure you're doing better. I will call you when I get the results. I would see you again in 2-3 weeks to make sure you're back pain is getting better

## 2011-12-13 NOTE — Assessment & Plan Note (Signed)
Appears to be mostly a muscle spasm. No signs of compression fracture with no spinous process tenderness. Patient is improving on his own we will do a dose of prednisone to attempt to decrease any information he is having. We'll continue to monitor his back pain. No imaging done this time. Differential does need to include multiple myeloma if patient does continue to have back pain with kidney failure. If patient returns in 3 weeks and if not much better with consider getting an SPEP UPEP

## 2012-01-16 ENCOUNTER — Ambulatory Visit (INDEPENDENT_AMBULATORY_CARE_PROVIDER_SITE_OTHER): Payer: No Typology Code available for payment source | Admitting: Family Medicine

## 2012-01-16 ENCOUNTER — Ambulatory Visit
Admission: RE | Admit: 2012-01-16 | Discharge: 2012-01-16 | Disposition: A | Discharge status: Home / Self Care | Payer: No Typology Code available for payment source | Source: Ambulatory Visit | Attending: Family Medicine | Admitting: Family Medicine

## 2012-01-16 ENCOUNTER — Encounter: Payer: Self-pay | Admitting: Family Medicine

## 2012-01-16 VITALS — BP 144/74 | HR 88 | Ht 71.0 in | Wt 200.0 lb

## 2012-01-16 DIAGNOSIS — M542 Cervicalgia: Secondary | ICD-10-CM | POA: Insufficient documentation

## 2012-01-16 DIAGNOSIS — M25512 Pain in left shoulder: Secondary | ICD-10-CM

## 2012-01-16 DIAGNOSIS — F1010 Alcohol abuse, uncomplicated: Secondary | ICD-10-CM | POA: Insufficient documentation

## 2012-01-16 DIAGNOSIS — M25519 Pain in unspecified shoulder: Secondary | ICD-10-CM

## 2012-01-16 MED ORDER — HYDROXYZINE HCL 25 MG PO TABS: 25.0000 mg | ORAL_TABLET | Freq: Three times a day (TID) | ORAL | Status: DC | PRN

## 2012-01-16 NOTE — Assessment & Plan Note (Signed)
As stated in overview. Patient given numbers of community outreach program second help. Patient states that he does have a but he is able to talk to and is comfortable talking to me about this as well. Denies any feelings of depression or any suicidal or homicidal ideation.

## 2012-01-16 NOTE — Patient Instructions (Addendum)
It is good to see you. I gave you an injection in her shoulder today. I hope this helps I do think some of this might be contributing to your neck pain as well. I will get an x-ray of your neck today and call you with the results. I'm going to get some labs and I will call you when I get the results. Continuing other medications including the hydroxyzine. You can take that medication up to 3 times a day for anxiety or when you feel like he needed drink. I should probably see you again in a month to make sure doing better.  Finding Treatment for Alcohol and Drug Addiction It can be hard to find the right place to get professional treatment. Here are some important things to consider:  There are different types of treatment to choose from.   Some programs are live-in (residential) while others are not (outpatient). Sometimes a combination is offered.   No single type of program is right for everyone.   Most treatment programs involve a combination of education, counseling, and a 12-step, spiritually-based approach.   There are non-spiritually based programs (not 12-step).   Some treatment programs are government sponsored. They are geared for patients without private insurance.   Treatment programs can vary in many respects such as:   Cost and types of insurance accepted.   Types of on-site medical services offered.   Length of stay, setting, and size.   Overall philosophy of treatment.  A person may need specialized treatment or have needs not addressed by all programs. For example, adolescents need treatment appropriate for their age. Other people have secondary disorders that must be managed as well. Secondary conditions can include mental illness, such as depression or diabetes. Often, a period of detoxification from alcohol or drugs is needed. This requires medical supervision and not all programs offer this. THINGS TO CONSIDER WHEN SELECTING A TREATMENT PROGRAM   Is the program  certified by the appropriate government agency? Even private programs must be certified and employ certified professionals.   Does the program accept your insurance? If not, can a payment plan be set up?   Is the facility clean, organized, and well run? Do they allow you to speak with graduates who can share their treatment experience with you? Can you tour the facility? Can you meet with staff?   Does the program meet the full range of individual needs?   Does the treatment program address sexual orientation and physical disabilities? Do they provide age, gender, and culturally appropriate treatment services?   Is treatment available in languages other than English?   Is long-term aftercare support or guidance encouraged and provided?   Is assessment of an individual's treatment plan ongoing to ensure it meets changing needs?   Does the program use strategies to encourage reluctant patients to remain in treatment long enough to increase the likelihood of success?   Does the program offer counseling (individual or group) and other behavioral therapies?   Does the program offer medicine as part of the treatment regimen, if needed?   Is there ongoing monitoring of possible relapse? Is there a defined relapse prevention program? Are services or referrals offered to family members to ensure they understand addiction and the recovery process? This would help them support the recovering individual.   Are 12-step meetings held at the center or is transport available for patients to attend outside meetings?   Shoulder Exercises EXERCISES  RANGE OF MOTION (ROM) AND STRETCHING EXERCISES  These exercises may help you when beginning to rehabilitate your injury. Your symptoms may resolve with or without further involvement from your physician, physical therapist or athletic trainer. While completing these exercises, remember:   Restoring tissue flexibility helps normal motion to return to the  joints. This allows healthier, less painful movement and activity.   An effective stretch should be held for at least 30 seconds.   A stretch should never be painful. You should only feel a gentle lengthening or release in the stretched tissue.  ROM - Pendulum  Bend at the waist so that your right / left arm falls away from your body. Support yourself with your opposite hand on a solid surface, such as a table or a countertop.   Your right / left arm should be perpendicular to the ground. If it is not perpendicular, you need to lean over farther. Relax the muscles in your right / left arm and shoulder as much as possible.   Gently sway your hips and trunk so they move your right / left arm without any use of your right / left shoulder muscles.   Progress your movements so that your right / left arm moves side to side, then forward and backward, and finally, both clockwise and counterclockwise.   Complete __________ repetitions in each direction. Many people use this exercise to relieve discomfort in their shoulder as well as to gain range of motion.  Repeat __________ times. Complete this exercise __________ times per day. STRETCH - Flexion, Standing  Stand with good posture. With an underhand grip on your right / left hand and an overhand grip on the opposite hand, grasp a broomstick or cane so that your hands are a little more than shoulder-width apart.   Keeping your right / left elbow straight and shoulder muscles relaxed, push the stick with your opposite hand to raise your right / left arm in front of your body and then overhead. Raise your arm until you feel a stretch in your right / left shoulder, but before you have increased shoulder pain.   Try to avoid shrugging your right / left shoulder as your arm rises by keeping your shoulder blade tucked down and toward your mid-back spine. Hold __________ seconds.   Slowly return to the starting position.  Repeat __________ times.  Complete this exercise __________ times per day. STRETCH - Internal Rotation  Place your right / left hand behind your back, palm-up.   Throw a towel or belt over your opposite shoulder. Grasp the towel/belt with your right / left hand.   While keeping an upright posture, gently pull up on the towel/belt until you feel a stretch in the front of your right / left shoulder.   Avoid shrugging your right / left shoulder as your arm rises by keeping your shoulder blade tucked down and toward your mid-back spine.   Hold __________. Release the stretch by lowering your opposite hand.  Repeat __________ times. Complete this exercise __________ times per day. STRETCH - External Rotation and Abduction  Stagger your stance through a doorframe. It does not matter which foot is forward.   As instructed by your physician, physical therapist or athletic trainer, place your hands:   And forearms above your head and on the door frame.   And forearms at head-height and on the door frame.   At elbow-height and on the door frame.   Keeping your head and chest upright and your stomach muscles tight to prevent over-extending your  low-back, slowly shift your weight onto your front foot until you feel a stretch across your chest and/or in the front of your shoulders.   Hold __________ seconds. Shift your weight to your back foot to release the stretch.  Repeat __________ times. Complete this stretch __________ times per day.  STRENGTHENING EXERCISES  These exercises may help you when beginning to rehabilitate your injury. They may resolve your symptoms with or without further involvement from your physician, physical therapist or athletic trainer. While completing these exercises, remember:   Muscles can gain both the endurance and the strength needed for everyday activities through controlled exercises.   Complete these exercises as instructed by your physician, physical therapist or athletic trainer.  Progress the resistance and repetitions only as guided.   You may experience muscle soreness or fatigue, but the pain or discomfort you are trying to eliminate should never worsen during these exercises. If this pain does worsen, stop and make certain you are following the directions exactly. If the pain is still present after adjustments, discontinue the exercise until you can discuss the trouble with your clinician.   If advised by your physician, during your recovery, avoid activity or exercises which involve actions that place your right / left hand or elbow above your head or behind your back or head. These positions stress the tissues which are trying to heal.  STRENGTH - Scapular Depression and Adduction  With good posture, sit on a firm chair. Supported your arms in front of you with pillows, arm rests or a table top. Have your elbows in line with the sides of your body.   Gently draw your shoulder blades down and toward your mid-back spine. Gradually increase the tension without tensing the muscles along the top of your shoulders and the back of your neck.   Hold for __________ seconds. Slowly release the tension and relax your muscles completely before completing the next repetition.   After you have practiced this exercise, remove the arm support and complete it in standing as well as sitting.  Repeat __________ times. Complete this exercise __________ times per day.  STRENGTH - External Rotators  Secure a rubber exercise band/tubing to a fixed object so that it is at the same height as your right / left elbow when you are standing or sitting on a firm surface.   Stand or sit so that the secured exercise band/tubing is at your side that is not injured.   Bend your elbow 90 degrees. Place a folded towel or small pillow under your right / left arm so that your elbow is a few inches away from your side.   Keeping the tension on the exercise band/tubing, pull it away from your body, as  if pivoting on your elbow. Be sure to keep your body steady so that the movement is only coming from your shoulder rotating.   Hold __________ seconds. Release the tension in a controlled manner as you return to the starting position.  Repeat __________ times. Complete this exercise __________ times per day.  STRENGTH - Supraspinatus  Stand or sit with good posture. Grasp a __________ weight or an exercise band/tubing so that your hand is "thumbs-up," like when you shake hands.   Slowly lift your right / left hand from your thigh into the air, traveling about 30 degrees from straight out at your side. Lift your hand to shoulder height or as far as you can without increasing any shoulder pain. Initially, many people do not  lift their hands above shoulder height.   Avoid shrugging your right / left shoulder as your arm rises by keeping your shoulder blade tucked down and toward your mid-back spine.   Hold for __________ seconds. Control the descent of your hand as you slowly return to your starting position.  Repeat __________ times. Complete this exercise __________ times per day.  STRENGTH - Shoulder Extensors  Secure a rubber exercise band/tubing so that it is at the height of your shoulders when you are either standing or sitting on a firm arm-less chair.   With a thumbs-up grip, grasp an end of the band/tubing in each hand. Straighten your elbows and lift your hands straight in front of you at shoulder height. Step back away from the secured end of band/tubing until it becomes tense.   Squeezing your shoulder blades together, pull your hands down to the sides of your thighs. Do not allow your hands to go behind you.   Hold for __________ seconds. Slowly ease the tension on the band/tubing as you reverse the directions and return to the starting position.  Repeat __________ times. Complete this exercise __________ times per day.  STRENGTH - Scapular Retractors  Secure a rubber exercise  band/tubing so that it is at the height of your shoulders when you are either standing or sitting on a firm arm-less chair.   With a palm-down grip, grasp an end of the band/tubing in each hand. Straighten your elbows and lift your hands straight in front of you at shoulder height. Step back away from the secured end of band/tubing until it becomes tense.   Squeezing your shoulder blades together, draw your elbows back as you bend them. Keep your upper arm lifted away from your body throughout the exercise.   Hold __________ seconds. Slowly ease the tension on the band/tubing as you reverse the directions and return to the starting position.  Repeat __________ times. Complete this exercise __________ times per day. STRENGTH - Scapular Depressors  Find a sturdy chair without wheels, such as a from a dining room table.   Keeping your feet on the floor, lift your bottom from the seat and lock your elbows.   Keeping your elbows straight, allow gravity to pull your body weight down. Your shoulders will rise toward your ears.   Raise your body against gravity by drawing your shoulder blades down your back, shortening the distance between your shoulders and ears. Although your feet should always maintain contact with the floor, your feet should progressively support less body weight as you get stronger.   Hold __________ seconds. In a controlled and slow manner, lower your body weight to begin the next repetition.  Repeat __________ times. Complete this exercise __________ times per day.  Document Released: 10/26/2005 Document Revised: 08/24/2011 Document Reviewed: 03/26/2009 [facility] Patient Information 2012 [place], [place].

## 2012-01-16 NOTE — Assessment & Plan Note (Signed)
Patient mostly has shoulder pain. Patient though did have a positive Spurling's with a history of having compression fracture in the past we'll get a cervical x-ray done today. Patient has no radiation of the pain unless you count the shoulder pain. Patient has 5 out of 5 strength in upper extremities we'll continue to monitor closely. With patient's chronic pain problems we will get an SPEP UPEP as well and we will consider vitamin D level

## 2012-01-16 NOTE — Progress Notes (Signed)
  Subjective:    Patient ID: William [name], male    DOB: [birth year], 51 y.o.   MRN: [id number]  HPI  51 year old male coming in with a complaint of lower back pain. Patient does have a history of having a compression fracture lumbar vertebrae. Patient states the pain started about a week ago happened when he was trying to reach for something. States that he tried to get up and started having pain right-sided no radiculopathy. Patient denies any bowel or bladder problems denies any radiation of pain or any weakness in the leg. Patient states since that time the patient has started again a little improved. Patient was given some Flexeril at an urgent care center as well as oxycodone which did seem to help. Patient does complain of some erectile dysfunction but he has had for some time and does not think this is related.   Hypertension Blood pressure at home: Not checking Blood pressure today:  Taking Meds: Yes Side effects: No ROS: Denies headache visual changes nausea, vomiting, chest pain or abdominal pain or shortness of breath.  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 Patient states that he's up to 3 times weekly now. Patient is somewhat concerned but overall feels that he is still in control. Patient states he does have a lot of people to talk to and does understand the consequences of him drinking with him having a history of cirrhosis. Patient denies any depressive symptoms denies any type of trigger recently. Patient would not like to start a quit date.  Review of Systems  She denies fever, chills, abdominal pain, hematuria, or trouble with bowel movements.    Objective:   Physical Exam Gen: No apparent distress CV: Regular in rhythm no murmur Pul CTAB Abd: BS+, NT,, ND  Shoulder: Inspection reveals no abnormalities, atrophy or asymmetry. Palpation is normal with no tenderness over AC joint or  bicipital groove. ROM is decreased in abduction as well as forward flexion Rotator cuff strength normal throughout. + Neer and Hawkin's tests, empty can. Speeds and Yergason's tests normal. No labral pathology noted with negative Obrien's, negative clunk and good stability. Normal scapular function observed. No painful arc and no drop arm sign. No apprehension sign Patient does have a positive Spurling's on left side  Back Exam: Inspection: No gross deformity Motion: Patient is moderately impingement flexion. SLR seated:         Negative                 SLR lying: Negative XSLR seated: Negative                       XSLR lying: Negative Seated HS Flexibility: Mild tightness bilaterally Palpable tenderness: Lumbar paraspinal left-sided  Sensory change: Neurovascular intact bilaterally Reflex change: 2+ bilaterally Strength out of 5 in all extremities     Assessment & Plan:

## 2012-01-16 NOTE — Assessment & Plan Note (Signed)
Patient has history of chronic kidney disease last blood drawn in December was normal. Patient's last creatinine was 1.42 which is an improvement. The patient's pain though we will also get an SPEP UPEP to rule out anything such as multiple myeloma.

## 2012-01-16 NOTE — Assessment & Plan Note (Signed)
Procedure note After verbal and written consent given pt was prepped with betadine.  1:3 kenalog 40 to lidocaine used in left shoulder.  Pt minimal bleeding dressed with band aid, Pt given red flags to look for pt had better pain control immediatly.

## 2012-01-18 ENCOUNTER — Other Ambulatory Visit: Payer: No Typology Code available for payment source

## 2012-01-18 LAB — PROTEIN ELECTROPHORESIS, SERUM
Albumin ELP: 52.5 % — ABNORMAL LOW (ref 55.8–66.1)
Alpha-1-Globulin: 4.7 % (ref 2.9–4.9)
Alpha-2-Globulin: 8.3 % (ref 7.1–11.8)
Beta 2: 5.7 % (ref 3.2–6.5)
Beta Globulin: 7.5 % — ABNORMAL HIGH (ref 4.7–7.2)
Total Protein, Serum Electrophoresis: 8.2 g/dL (ref 6.0–8.3)

## 2012-01-18 NOTE — Progress Notes (Signed)
24 hr urine dropped off today William [name]

## 2012-01-20 LAB — UIFE/LIGHT CHAINS/TP QN, 24-HR UR
Albumin, U: DETECTED
Alpha 2, Urine: DETECTED — AB
Free Kappa/Lambda Ratio: 11.64 ratio — ABNORMAL HIGH (ref 2.04–10.37)
Free Lt Chn Excr Rate: 240 mg/d
Total Protein, Urine-Ur/day: 270 mg/d — ABNORMAL HIGH (ref 10–140)
Total Protein, Urine: 14.4 mg/dL

## 2012-01-23 ENCOUNTER — Telehealth: Payer: Self-pay | Admitting: Family Medicine

## 2012-01-23 DIAGNOSIS — D472 Monoclonal gammopathy: Secondary | ICD-10-CM

## 2012-01-23 NOTE — Telephone Encounter (Signed)
Called patient told him I want to refer him to oncology for the potential of having multiple myeloma. Patient did not have any questions will save most of his questions for the oncologist. Told him that the labs do not only mean this and could just be an MGUS.

## 2012-01-27 ENCOUNTER — Telehealth: Payer: Self-pay | Admitting: Oncology

## 2012-01-27 NOTE — Telephone Encounter (Signed)
Dx-MGUS

## 2012-01-30 ENCOUNTER — Other Ambulatory Visit: Payer: Self-pay | Admitting: Oncology

## 2012-01-30 DIAGNOSIS — D729 Disorder of white blood cells, unspecified: Secondary | ICD-10-CM

## 2012-02-01 ENCOUNTER — Other Ambulatory Visit: Payer: No Typology Code available for payment source | Admitting: Lab

## 2012-02-01 ENCOUNTER — Telehealth: Payer: Self-pay | Admitting: Oncology

## 2012-02-01 ENCOUNTER — Ambulatory Visit: Payer: No Typology Code available for payment source

## 2012-02-01 ENCOUNTER — Ambulatory Visit: Payer: No Typology Code available for payment source | Admitting: Oncology

## 2012-02-01 NOTE — Telephone Encounter (Signed)
pt called and has to r/s 2/6 appt as his truck broke down.  Appt has been r/s to 2/12   aom

## 2012-02-06 ENCOUNTER — Other Ambulatory Visit: Payer: Self-pay | Admitting: Oncology

## 2012-02-06 DIAGNOSIS — D729 Disorder of white blood cells, unspecified: Secondary | ICD-10-CM

## 2012-02-07 ENCOUNTER — Telehealth: Payer: Self-pay | Admitting: Oncology

## 2012-02-07 ENCOUNTER — Ambulatory Visit (HOSPITAL_BASED_OUTPATIENT_CLINIC_OR_DEPARTMENT_OTHER): Payer: No Typology Code available for payment source | Admitting: Oncology

## 2012-02-07 ENCOUNTER — Ambulatory Visit (HOSPITAL_COMMUNITY)
Admission: RE | Admit: 2012-02-07 | Discharge: 2012-02-07 | Disposition: A | Discharge status: Home / Self Care | Payer: Self-pay | Source: Ambulatory Visit | Attending: Oncology | Admitting: Oncology

## 2012-02-07 ENCOUNTER — Ambulatory Visit: Payer: No Typology Code available for payment source

## 2012-02-07 ENCOUNTER — Other Ambulatory Visit (HOSPITAL_BASED_OUTPATIENT_CLINIC_OR_DEPARTMENT_OTHER): Payer: No Typology Code available for payment source | Admitting: Lab

## 2012-02-07 VITALS — BP 167/113 | HR 91 | Temp 98.1°F | Ht 71.0 in | Wt 197.9 lb

## 2012-02-07 DIAGNOSIS — E8809 Other disorders of plasma-protein metabolism, not elsewhere classified: Secondary | ICD-10-CM | POA: Insufficient documentation

## 2012-02-07 DIAGNOSIS — D7289 Other specified disorders of white blood cells: Secondary | ICD-10-CM

## 2012-02-07 DIAGNOSIS — M5137 Other intervertebral disc degeneration, lumbosacral region: Secondary | ICD-10-CM | POA: Insufficient documentation

## 2012-02-07 DIAGNOSIS — M47812 Spondylosis without myelopathy or radiculopathy, cervical region: Secondary | ICD-10-CM | POA: Insufficient documentation

## 2012-02-07 DIAGNOSIS — D729 Disorder of white blood cells, unspecified: Secondary | ICD-10-CM

## 2012-02-07 DIAGNOSIS — M51379 Other intervertebral disc degeneration, lumbosacral region without mention of lumbar back pain or lower extremity pain: Secondary | ICD-10-CM | POA: Insufficient documentation

## 2012-02-07 LAB — CBC WITH DIFFERENTIAL/PLATELET
BASO%: 0.5 % (ref 0.0–2.0)
Basophils Absolute: 0 10*3/uL (ref 0.0–0.1)
EOS%: 6.9 % (ref 0.0–7.0)
Eosinophils Absolute: 0.3 10*3/uL (ref 0.0–0.5)
HCT: 35.1 % — ABNORMAL LOW (ref 38.4–49.9)
HGB: 11.9 g/dL — ABNORMAL LOW (ref 13.0–17.1)
LYMPH%: 27.6 % (ref 14.0–49.0)
MCH: 28.7 pg (ref 27.2–33.4)
MCHC: 33.9 g/dL (ref 32.0–36.0)
MCV: 84.8 fL (ref 79.3–98.0)
MONO#: 0.2 10*3/uL (ref 0.1–0.9)
MONO%: 4.8 % (ref 0.0–14.0)
NEUT#: 2.4 10*3/uL (ref 1.5–6.5)
NEUT%: 60.2 % (ref 39.0–75.0)
Platelets: 88 10*3/uL — ABNORMAL LOW (ref 140–400)
RBC: 4.14 10*6/uL — ABNORMAL LOW (ref 4.20–5.82)
RDW: 16.6 % — ABNORMAL HIGH (ref 11.0–14.6)
WBC: 3.9 10*3/uL — ABNORMAL LOW (ref 4.0–10.3)
lymph#: 1.1 10*3/uL (ref 0.9–3.3)
nRBC: 0 % (ref 0–0)

## 2012-02-07 NOTE — Progress Notes (Signed)
CC:   William Primas, DO  REASON FOR CONSULTATION:  Evaluation for plasma cell disorder.  HISTORY OF PRESENT ILLNESS:  This is a 51 year old gentleman, native of [place], currently resides in [place].  This is a gentleman with a significant past medical history significant for hypertension and alcoholic liver disease.  The patient has been followed by Dr. Katrinka Blazing, his primary care physician.  As part of his evaluation for chronic renal insufficiency, he obtained a 24-hour urine collection which showed a slightly increased total protein of 270; the range is between 10-140. The electrophoresis did show that really no urine M-spike was detected; however, there was slight increased kappa to lambda ratio of 11.64; the upper limit of normal is 10.37.  The free lambda light chain is slightly elevated at 1.10.  That was done on January 18, 2012.  He did have a serum protein electrophoresis done on January 16, 2012, which showed no M-spike detected, although there is no quantitative immunoglobulin that I can see.  Of note, his chemistries showed that his creatinine is around 1.4.  It was as high as 1.6 in September 2011.  In June of 2012, it was 1.75 and now is 1.4.  He has slight increase in his liver function tests including alkaline phosphatase of 197.  For that reason, the patient was referred to me for evaluation for possible plasma cell disorder.  Upon interviewing William [name], he is asymptomatic.  He does report back pain, but that has been chronic in nature, and he has been imaged multiple times, but otherwise has not had any pathological fractures, has not had any recent hospitalization, has not had any opportunistic infections.  REVIEW OF SYSTEMS:  He does not report any headaches, blurry vision, double vision.  Does not report any motor or sensory neuropathy.  Does not report any alteration in mental status.  Does not report any psychiatric issues, depression.  Does not report  any fever, chills, sweats.  Does not report any cough, hemoptysis, hematemesis.  No nausea or vomiting.  No abdominal pain.  No hematochezia, no melena.  No genitourinary complaints.  The rest of the review of systems is unremarkable.  PAST MEDICAL HISTORY:  Significant for hypertension, chronic back pain, history of alcoholic cirrhosis of the liver.  He had a history of hepatic encephalopathy, history of chronic renal sufficiency, as well as arthritis and chronic back pain.  MEDICATIONS:  He is on calcium supplements, he is on Nexium, iron sulfate, folic acid, he is on Lasix, Neurontin, Atarax, magnesium oxide, meloxicam, nadolol and MiraLAX as well as thiamine.  ALLERGIES:  None.  SOCIAL HISTORY:  He is single.  He has 1 daughter in her 30s.  He still drinks about 3 times weekly, although he has been drinking a lot heavier in the past.  He does not smoke.  FAMILY HISTORY:  His father is still alive in decent health, he has a history of hypertension.  Mother died, although it was unclear the reason, he mentioned kidney failure.  He has 5 siblings, none of them have any malignant disorder or any blood disorders.  PHYSICAL EXAMINATION:  General:  Alert, awake gentleman, appeared in no active distress.  Vital Signs:  Blood pressure is 167/100, pulse 91, respirations 20, temperature is 98.  HEENT:  Head is normocephalic, atraumatic.  Pupils equal, round and reactive to light.  Oral mucosa moist and pink.  Neck:  Supple, no lymphadenopathy.  Heart:  Regular rate and rhythm, S1 and S2.  Lungs:  Clear to auscultation without rhonchi, wheezes or dullness to percussion.  Abdomen:  Soft, nontender, no hepatosplenomegaly.  Extremities:  No edema.  LABORATORY DATA:  As discussed in the history of present illness.  His 24-hour urine collection, as mentioned, did pick up a positive monoclonal free kappa light chain; however, his serum protein electrophoresis showed a nonspecific polyclonal  type increase in the gamma regions.  ASSESSMENT AND PLAN:  This is a pleasant 51 year old gentleman who appears to have a polyclonal elevation in his gamma globulins in the serum; however, he does have a monoclonal free kappa light chain in the urine.  The kappa light chain in the urine is measuring as 12.8.  Again, there is also elevated lambda as well of 1.1, and the ratio is mildly elevated of 11.64.  I had a lengthy discussion today with William [name] discussing the differential diagnosis.  A plasma cell disorder is certainly a possibility.  Conditions like a monoclonal gammopathy of undetermined significance versus multiple myeloma versus a condition like amyloidosis are all a consideration; however, I think they are less likely in this particular setting.  I think the most likely etiology here is probably a polyclonal elevation of his serum.  Both heavy chains as well as urine light chains may indicate a reactive process to his liver disease which is certainly the most likely etiology at this particular time.  In terms of further management, I would like to obtain serum light chains for completeness.  I would like to also obtain a skeletal survey, and if this is all negative, I probably would recommend repeating his urine studies in about 6-12 months.  I do not think there is really clear-cut evidence of any plasma cell disorder.  I do not see really any evidence to suggest end-organ damage.  I believe his anemia and renal insufficiency are probably related to other factors at this time.  However, I will see him after I complete my workup and will give him my final recommendation at this time.    ______________________________ Benjiman Core, M.D. FNS/MEDQ  D:  02/07/2012  T:  02/07/2012  Job:  [id number]

## 2012-02-07 NOTE — Progress Notes (Signed)
Note dictated

## 2012-02-07 NOTE — Telephone Encounter (Signed)
appt made and printed for 3/6 and pt sent to rad. for bone survey,ok to do per radiology aom

## 2012-02-08 ENCOUNTER — Ambulatory Visit: Payer: Self-pay | Admitting: Family Medicine

## 2012-02-09 LAB — SPEP & IFE WITH QIG
Alpha-2-Globulin: 8.4 % (ref 7.1–11.8)
IgG (Immunoglobin G), Serum: 2040 mg/dL — ABNORMAL HIGH (ref 650–1600)
Total Protein, Serum Electrophoresis: 8.4 g/dL — ABNORMAL HIGH (ref 6.0–8.3)

## 2012-02-09 LAB — KAPPA/LAMBDA LIGHT CHAINS
Kappa free light chain: 2.95 mg/dL — ABNORMAL HIGH (ref 0.33–1.94)
Lambda Free Lght Chn: 2.36 mg/dL (ref 0.57–2.63)

## 2012-02-09 LAB — COMPREHENSIVE METABOLIC PANEL
ALT: 29 U/L (ref 0–53)
AST: 40 U/L — ABNORMAL HIGH (ref 0–37)
Albumin: 4.5 g/dL (ref 3.5–5.2)
Calcium: 10.1 mg/dL (ref 8.4–10.5)
Chloride: 98 mEq/L (ref 96–112)
Potassium: 4.3 mEq/L (ref 3.5–5.3)
Sodium: 137 mEq/L (ref 135–145)
Total Protein: 8.4 g/dL — ABNORMAL HIGH (ref 6.0–8.3)

## 2012-02-14 ENCOUNTER — Ambulatory Visit: Payer: Self-pay | Admitting: Family Medicine

## 2012-02-28 ENCOUNTER — Ambulatory Visit (INDEPENDENT_AMBULATORY_CARE_PROVIDER_SITE_OTHER): Payer: Self-pay | Admitting: Family Medicine

## 2012-02-28 ENCOUNTER — Encounter: Payer: Self-pay | Admitting: Family Medicine

## 2012-02-28 DIAGNOSIS — M25519 Pain in unspecified shoulder: Secondary | ICD-10-CM

## 2012-02-28 DIAGNOSIS — B070 Plantar wart: Secondary | ICD-10-CM

## 2012-02-28 DIAGNOSIS — M25512 Pain in left shoulder: Secondary | ICD-10-CM

## 2012-02-28 DIAGNOSIS — K7030 Alcoholic cirrhosis of liver without ascites: Secondary | ICD-10-CM

## 2012-02-28 DIAGNOSIS — M25569 Pain in unspecified knee: Secondary | ICD-10-CM

## 2012-02-28 DIAGNOSIS — I10 Essential (primary) hypertension: Secondary | ICD-10-CM

## 2012-02-28 MED ORDER — HYDROCODONE-ACETAMINOPHEN 5-500 MG PO TABS: 0.5000 | ORAL_TABLET | Freq: Three times a day (TID) | ORAL | Status: DC | PRN

## 2012-02-28 MED ORDER — AMLODIPINE BESYLATE 5 MG PO TABS: 5.0000 mg | ORAL_TABLET | Freq: Every day | ORAL | Status: DC

## 2012-02-28 MED ORDER — GABAPENTIN 100 MG PO CAPS: ORAL_CAPSULE | ORAL | Status: DC

## 2012-02-28 NOTE — Assessment & Plan Note (Signed)
Will discontinue his meloxicam and try to avoid any type of chronic kidney medications. Continue to monitor her at least every 6 months. No need to send to nephrologist yet.

## 2012-02-28 NOTE — Assessment & Plan Note (Signed)
Does not seem to be getting any better patient on skeletal survey did show some osteoarthritis. We'll send to physical therapy and see if we can increase range of motion. In addition to that patient will be given low-dose Vicodin to try to avoid hurting his kidneys anymore as well as keeping low dose of his Tylenol to avoid any liver damage.

## 2012-02-28 NOTE — Assessment & Plan Note (Signed)
Still elevated at this time we'll start a calcium channel blocker and monitor.

## 2012-02-28 NOTE — Patient Instructions (Signed)
Stop the meloxicam. I'm giving you Vicodin to have on hand. Try to take a half a tab up to 3 times a day for the pain. He may go all the way up to one tab every 12 hours if needed. I'm giving you a new medicine for your blood pressure. It is called amlodipine take one pill daily. I'm going to decrease in your plantar wart. This might be a little painful for the next day or 2 but will get better. He'll have you come back in 2 weeks to check your blood pressure as well as your feet. I am also going to send you to physical therapy for your shoulder.

## 2012-02-28 NOTE — Assessment & Plan Note (Signed)
After verbal and written consent patient did have debridement of the plantar warts and that did have liquid nitrogen placed. Patient also given a arch support to try to help avoid the impact on the area. Patient will return in 2 weeks for recheck and likely another treatment.

## 2012-02-28 NOTE — Progress Notes (Signed)
  Subjective:    Patient ID: William [name], male    DOB: 1961-07-31, 51 y.o.   MRN: [id number]  Shoulder Pain   Hypertension   51 year old male coming in with a complaint of lower back pain. Was sent to hematology for workup for the potential of MGUS versus multiple myeloma. Patient did have a skeletal survey done which was normal. At this time patient is going to followup with hematology and appears that he will likely have repeat urinalysis in 6-12 months. Patient though likely will have an MGUS and will be monitored as a potential smoldering case. Patient states that his pain had seemed to improve a little bit with the meloxicam patient does have chronic kidney disease and today's blood pressure is up. Patient does take his pain medications otherwise.   Hypertension Blood pressure at home: Not checking Blood pressure today: 164/108 on recheck still elevated Taking Meds: Yes Side effects: No ROS: Denies headache visual changes nausea, vomiting, chest pain or abdominal pain or shortness of breath.  Cirrhosis of liver secondary to history of alcohol abuse-still drinking but denies any type of swelling or abdominal pain out of the ordinary. Patient denies any depressive symptoms denies any type of trigger recently. Patient would not like to start a quit date.  Chronic kidney disease-  Lab Results  Component Value Date   CREATININE 1.57* 02/07/2012   been fairly constant in the low at 1.7 range. We'll continue to monitor recheck again in 2 months. Patient denies any lower back pain is making plenty of urine.  Continue shoulder pain. Patient states that the pain seemed to improve after the injection for approximately 2 weeks but then started having pain again. Patient has been doing the stretches but intermittently patient denies any radiation of the pain at this time no numbness in extremities either. Has interfered with her that his activities of daily living it is not working anymore at this  time.   Patient is also complaining of left foot pain. Patient states that it is in the plantar aspect has 2 spots on the bottom of the foot that do seem to be very painful. Review of Systems  denies fever, chills, abdominal pain, hematuria, or trouble with bowel movements.    Objective:   Physical Exam Gen: No apparent distress CV: Regular in rhythm no murmur Pul CTAB Abd: BS+, NT,, ND  Shoulder: Inspection reveals no abnormalities, atrophy or asymmetry. Palpation is normal with no tenderness over AC joint or bicipital groove. ROM is decreased in abduction as well as forward flexion Rotator cuff strength normal throughout. + Neer and Hawkin's tests, empty can. No change from previous exam Speeds and Yergason's tests normal. No labral pathology noted with negative Obrien's, negative clunk and good stability. Normal scapular function observed. No painful arc and no drop arm sign. No apprehension sign Patient does have a positive Spurling's on left side Lower extremity/foot exam: No swelling patient of the plantar aspect of the foot does have 2 plantar warts they are tender to palpation. After verbal and written consent patient did have debridement of the plantar warts and that did have liquid nitrogen placed. Patient also given a arch support to try to help avoid the impact on the area.      Assessment & Plan:

## 2012-02-28 NOTE — Assessment & Plan Note (Signed)
Patient continues to drink seems to be doing fairly well overall we'll not make any changes patient was given information on potential support groups.

## 2012-02-29 ENCOUNTER — Telehealth: Payer: Self-pay | Admitting: Oncology

## 2012-02-29 ENCOUNTER — Ambulatory Visit (HOSPITAL_BASED_OUTPATIENT_CLINIC_OR_DEPARTMENT_OTHER): Payer: Self-pay | Admitting: Oncology

## 2012-02-29 VITALS — BP 160/102 | HR 94 | Temp 97.7°F | Ht 71.0 in | Wt 200.1 lb

## 2012-02-29 DIAGNOSIS — K7030 Alcoholic cirrhosis of liver without ascites: Secondary | ICD-10-CM

## 2012-02-29 NOTE — Telephone Encounter (Signed)
Gv pt appt for sept2013 

## 2012-02-29 NOTE — Progress Notes (Signed)
Hematology and Oncology Follow Up Visit  William [name] [id number] December 14, 1961 51 y.o. 02/29/2012 4:10 PM  CC: William Primas, DO   Principle Diagnosis: 51 year old gentleman with a polyclonal elevation in his gamma globulins in the serum; however, he does have a monoclonal free kappa light chain in the urine. This is likely reactive due to liver disease. Work up did not reveal a plasma cell disorder in 01/2012.    Current therapy: Observation and surveillance  Interim History: William [name] presents today for a followup visit. He is a gentleman I saw for the first time in February of 2013. He is a gentleman with history of liver disease and an evaluation for a possible plasma cell disorder. His workup did not reveal any clear-cut end organ damage to suggest a multiple myeloma. His urine electrophoresis did show slight elevation of kappa to lambda ratio at 11.64. His serum electrophoresis did not show a monoclonal protein. Clinically William [name] does not report any new symptoms. He continued to have back pain but no pathological fractures no infectious process or hospitalizations. His skeletal survey was also negative. Medications: I have reviewed the patient's current medications. Current outpatient prescriptions:amLODipine (NORVASC) 5 MG tablet, Take 1 tablet (5 mg total) by mouth daily., Disp: 90 tablet, Rfl: 3;  calcium carbonate (OS-CAL) 600 MG TABS, Take 600 mg by mouth daily.  , Disp: , Rfl: ;  esomeprazole (NEXIUM) 40 MG capsule, Take 1 capsule (40 mg total) by mouth daily before breakfast., Disp: 90 capsule, Rfl: 3;  ferrous sulfate 325 (65 FE) MG EC tablet, Take 325 mg by mouth daily with breakfast.  , Disp: , Rfl:  folic acid (FOLVITE) 1 MG tablet, TAKE ONE TABLET BY MOUTH EVERY DAY, Disp: 90 tablet, Rfl: 1;  furosemide (LASIX) 20 MG tablet, Take 0.5 tablets (10 mg total) by mouth daily., Disp: 90 tablet, Rfl: 1;  gabapentin (NEURONTIN) 100 MG capsule, Take 100 mg in morning 100 mg in the  afternoon and 300 mg at night, Disp: 450 capsule, Rfl: 2 HYDROcodone-acetaminophen (VICODIN) 5-500 MG per tablet, Take 0.5 tablets by mouth every 8 (eight) hours as needed for pain., Disp: 45 tablet, Rfl: 0;  hydrOXYzine (ATARAX/VISTARIL) 25 MG tablet, Take 1 tablet (25 mg total) by mouth every 8 (eight) hours as needed for itching., Disp: 90 tablet, Rfl: 1;  magnesium oxide (MAG-OX 400) 400 MG tablet, Take 1 tablet (400 mg total) by mouth daily., Disp: 200 tablet, Rfl: 1 polyethylene glycol (MIRALAX / GLYCOLAX) packet, Take 17 g by mouth 2 (two) times daily., Disp: 56 each, Rfl: 0;  sodium bicarbonate 650 MG tablet, Take 1 tablet (650 mg total) by mouth 2 (two) times daily., Disp: 60 tablet, Rfl: 6  Allergies: No Known Allergies  Past Medical History, Surgical history, Social history, and Family History were reviewed and updated.  Review of Systems: Constitutional:  Negative for fever, chills, night sweats, anorexia, weight loss, pain. Cardiovascular: no chest pain or dyspnea on exertion Respiratory: no cough, shortness of breath, or wheezing Neurological: no TIA or stroke symptoms Dermatological: negative ENT: negative Skin: Negative. Gastrointestinal: no abdominal pain, change in bowel habits, or black or bloody stools Genito-Urinary: negative Hematological and Lymphatic: negative Breast: negative Musculoskeletal: negative Remaining ROS negative. Physical Exam: Blood pressure 160/102, pulse 94, temperature 97.7 F (36.5 C), temperature source Oral, height 5\' 11"  (1.803 m), weight 200 lb 1.6 oz (90.765 kg). ECOG: 1 General appearance: alert Head: Normocephalic, without obvious abnormality, atraumatic Neck: no adenopathy, no carotid bruit, no JVD,  supple, symmetrical, trachea midline and thyroid not enlarged, symmetric, no tenderness/mass/nodules Lymph nodes: Cervical, supraclavicular, and axillary nodes normal. Heart:regular rate and rhythm, S1, S2 normal, no murmur, click, rub or  gallop Lung:chest clear, no wheezing, rales, normal symmetric air entry Abdomin: soft, non-tender, without masses or organomegaly EXT:no erythema, induration, or nodules   Lab Results: Lab Results  Component Value Date   WBC 3.9* 02/07/2012   HGB 11.9* 02/07/2012   HCT 35.1* 02/07/2012   MCV 84.8 02/07/2012   PLT 88* 02/07/2012     Chemistry      Component Value Date/Time   NA 137 02/07/2012 1301   K 4.3 02/07/2012 1301   CL 98 02/07/2012 1301   CO2 27 02/07/2012 1301   BUN 20 02/07/2012 1301   CREATININE 1.57* 02/07/2012 1301   CREATININE 1.42* 12/13/2011 1051      Component Value Date/Time   CALCIUM 10.1 02/07/2012 1301   ALKPHOS 235* 02/07/2012 1301   AST 40* 02/07/2012 1301   ALT 29 02/07/2012 1301   BILITOT 1.3* 02/07/2012 1301       Impression and Plan: 51 year old gentleman with: 1.  Polyclonal elevation in his gamma globulins in the serum; however, he does have a monoclonal free kappa light chain in the urine. 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 Reactive due to his liver disease. There is evidence of multiple myeloma at this point.  The plan is to continue follow him and repeat the labs in 6 months. If there are changes to suggest end organ damage, then I will restage him at that time.   2.  Liver disease: he is following with PCP regarding that.      Eli Hose, MD 3/6/20134:10 PM

## 2012-03-01 ENCOUNTER — Telehealth: Payer: Self-pay | Admitting: Family Medicine

## 2012-03-01 ENCOUNTER — Telehealth (HOSPITAL_COMMUNITY): Payer: Self-pay

## 2012-03-01 DIAGNOSIS — M25569 Pain in unspecified knee: Secondary | ICD-10-CM

## 2012-03-01 DIAGNOSIS — I10 Essential (primary) hypertension: Secondary | ICD-10-CM

## 2012-03-01 NOTE — Telephone Encounter (Signed)
Would like Rx for Vicodin and [facility]asc [facility].

## 2012-03-02 ENCOUNTER — Encounter: Payer: Self-pay | Admitting: Oncology

## 2012-03-02 MED ORDER — AMLODIPINE BESYLATE 5 MG PO TABS: 5.0000 mg | ORAL_TABLET | Freq: Every day | ORAL | Status: DC

## 2012-03-02 MED ORDER — HYDROCODONE-ACETAMINOPHEN 5-500 MG PO TABS: 0.5000 | ORAL_TABLET | Freq: Three times a day (TID) | ORAL | Status: DC | PRN

## 2012-03-02 NOTE — Telephone Encounter (Signed)
I have phone in the order and sent in to [facility].  Per my records he [facility] so sent it in to [facility].

## 2012-03-07 ENCOUNTER — Telehealth: Payer: Self-pay | Admitting: Family Medicine

## 2012-03-07 ENCOUNTER — Ambulatory Visit: Payer: Self-pay | Attending: Family Medicine | Admitting: Physical Therapy

## 2012-03-07 DIAGNOSIS — IMO0001 Reserved for inherently not codable concepts without codable children: Secondary | ICD-10-CM | POA: Insufficient documentation

## 2012-03-07 DIAGNOSIS — M25519 Pain in unspecified shoulder: Secondary | ICD-10-CM | POA: Insufficient documentation

## 2012-03-07 DIAGNOSIS — M542 Cervicalgia: Secondary | ICD-10-CM | POA: Insufficient documentation

## 2012-03-07 DIAGNOSIS — M25569 Pain in unspecified knee: Secondary | ICD-10-CM

## 2012-03-07 MED ORDER — HYDROCODONE-ACETAMINOPHEN 5-500 MG PO TABS: 0.5000 | ORAL_TABLET | Freq: Three times a day (TID) | ORAL | Status: DC | PRN

## 2012-03-07 NOTE — Telephone Encounter (Signed)
Patient stopped by an would like a written prescription for Vicodin so he can [facility].

## 2012-03-07 NOTE — Telephone Encounter (Signed)
Called patient back he did not get his medications filled through is too expensive. Patient was written through the narcotic database shows that he did not pick up his medications. We will give him another prescription so he can go have it filled.

## 2012-03-13 ENCOUNTER — Encounter: Payer: Self-pay | Admitting: Family Medicine

## 2012-03-13 ENCOUNTER — Ambulatory Visit (INDEPENDENT_AMBULATORY_CARE_PROVIDER_SITE_OTHER): Payer: Self-pay | Admitting: Family Medicine

## 2012-03-13 VITALS — BP 153/102 | HR 102 | Temp 98.1°F | Ht 71.0 in | Wt 198.0 lb

## 2012-03-13 DIAGNOSIS — B070 Plantar wart: Secondary | ICD-10-CM

## 2012-03-13 DIAGNOSIS — N183 Chronic kidney disease, stage 3 unspecified: Secondary | ICD-10-CM

## 2012-03-13 DIAGNOSIS — I10 Essential (primary) hypertension: Secondary | ICD-10-CM

## 2012-03-13 DIAGNOSIS — M25519 Pain in unspecified shoulder: Secondary | ICD-10-CM

## 2012-03-13 DIAGNOSIS — M25512 Pain in left shoulder: Secondary | ICD-10-CM

## 2012-03-13 MED ORDER — AMLODIPINE BESYLATE 5 MG PO TABS: 5.0000 mg | ORAL_TABLET | Freq: Every day | ORAL | Status: DC

## 2012-03-13 NOTE — Assessment & Plan Note (Signed)
Encourage patient to continue exercises patient will be doing this at home. Seems to be improving.

## 2012-03-13 NOTE — Progress Notes (Signed)
  Subjective:    Patient ID: William [name], male    DOB: December 07, 1961, 51 y.o.   MRN: [id number]  Shoulder Pain   Hypertension   Hypertension Blood pressure at home: Not checking Blood pressure today: 152/102 on recheck still elevated Taking Meds: No did not get amlodipine yet from pharmacy. Side effects: No ROS: Denies headache visual changes nausea, vomiting, chest pain or abdominal pain or shortness of breath.   Chronic kidney disease-  Lab Results  Component Value Date   CREATININE 1.57* 02/07/2012   been fairly constant in the low at 1.7 range. We'll continue to monitor recheck again in 2 months. Patient denies any lower back pain is making plenty of urine. We'll need to consider starting any ACE inhibitor for kidney protection effects but last time when done patient's creatinine became elevated into the 2.0 range  Continue shoulder pain. Started physical therapy which he states has improved significantly since doing exercises. Patient is not plan: Back and continued to do physical therapy at home. Patient denies any shooting pain or any numbness in the extremities.   Plan warts-patient had these previously did have some debridement as well as liquid nitrogen placed 2 weeks ago. Patient states it has been much improved started to hurt now in the last one to 2 days. Denies any radiation of pain or any numbness no open sores. Review of Systems  denies fever, chills, abdominal pain, hematuria, or trouble with bowel movements.    Objective:   Physical Exam  vitals reviewed Gen: No apparent distress CV: Regular in rhythm no murmur Pul CTAB Abd: BS+, NT,, ND  Shoulder: Inspection reveals no abnormalities, atrophy or asymmetry. Palpation is normal with no tenderness over AC joint or bicipital groove. ROM is decreased in abduction as well as forward flexion Rotator cuff strength normal throughout. + Neer and Hawkin's tests, empty can. No change from previous exam Speeds and  Yergason's tests normal. No labral pathology noted with negative Obrien's, negative clunk and good stability. Normal scapular function observed. No painful arc and no drop arm sign. No apprehension sign Patient does have a positive Spurling's on left side Lower extremity/foot exam: No swelling patient of the plantar aspect of the foot does have 2 plantar warts they are tender to palpation. After verbal and written consent patient did have debridement of the plantar warts and that did have liquid nitrogen placed. Was improved significantly from previous visit.      Assessment & Plan:

## 2012-03-13 NOTE — Assessment & Plan Note (Signed)
After verbal consent patient did have liquid nitrogen placed over the plantar warts again. One of the 2 days had previously is now flush with the surface of the foot which shows good improvement. The larger one is improving as well we'll have patient back in one month for helping the plantar wart one more time.

## 2012-03-13 NOTE — Patient Instructions (Signed)
It is good to see you. I am sending in medication for your blood pressure again. I worked on your foot again today and would like you to come back in one month for followup. I will see you in one month's time.

## 2012-03-13 NOTE — Assessment & Plan Note (Signed)
Continue to monitor, patient is to continue to see oncology, no new changes

## 2012-03-13 NOTE — Assessment & Plan Note (Signed)
Will attempt to have patient actually take amlodipine before we make any other drastic changes. Patient likely will be well-controlled, if not would consider actually starting a very low dose ARB or ACE inhibitor.

## 2012-04-02 ENCOUNTER — Telehealth: Payer: Self-pay | Admitting: Oncology

## 2012-04-02 NOTE — Telephone Encounter (Signed)
Patient called back stated that he was interested in still getting financial assistance if he qualifies. I asked patient to please send in income verification as soon as possible. He got fax number and said he would fax information in.

## 2012-04-02 NOTE — Telephone Encounter (Signed)
Called patient today in reference to financial application that patient turned in. I had sent a letter to patient on 03/02/12 requesting more information that was needed but patient has not responded wanted to give patient a call. Left message for patient to call me when he gets message to see it he was still interested in pursuing financial assistance.

## 2012-05-29 ENCOUNTER — Other Ambulatory Visit: Payer: Self-pay | Admitting: *Deleted

## 2012-05-29 MED ORDER — SODIUM BICARBONATE 650 MG PO TABS: 650.0000 mg | ORAL_TABLET | Freq: Two times a day (BID) | ORAL | Status: DC

## 2012-05-29 MED ORDER — FUROSEMIDE 20 MG PO TABS: 10.0000 mg | ORAL_TABLET | Freq: Every day | ORAL | Status: DC

## 2012-05-29 MED ORDER — FOLIC ACID 1 MG PO TABS: 1.0000 mg | ORAL_TABLET | Freq: Every day | ORAL | Status: DC

## 2012-06-11 ENCOUNTER — Other Ambulatory Visit: Payer: Self-pay | Admitting: Family Medicine

## 2012-06-15 ENCOUNTER — Encounter: Payer: Self-pay | Admitting: Family Medicine

## 2012-06-15 ENCOUNTER — Ambulatory Visit (INDEPENDENT_AMBULATORY_CARE_PROVIDER_SITE_OTHER): Payer: Self-pay | Admitting: Family Medicine

## 2012-06-15 ENCOUNTER — Other Ambulatory Visit (HOSPITAL_COMMUNITY)
Admission: RE | Admit: 2012-06-15 | Discharge: 2012-06-15 | Disposition: A | Discharge status: Home / Self Care | Payer: Self-pay | Source: Ambulatory Visit | Attending: Family Medicine | Admitting: Family Medicine

## 2012-06-15 VITALS — BP 150/90 | HR 103 | Temp 98.5°F | Ht 71.0 in | Wt 192.0 lb

## 2012-06-15 DIAGNOSIS — Z202 Contact with and (suspected) exposure to infections with a predominantly sexual mode of transmission: Secondary | ICD-10-CM

## 2012-06-15 DIAGNOSIS — Z2089 Contact with and (suspected) exposure to other communicable diseases: Secondary | ICD-10-CM

## 2012-06-15 DIAGNOSIS — M25569 Pain in unspecified knee: Secondary | ICD-10-CM

## 2012-06-15 DIAGNOSIS — I10 Essential (primary) hypertension: Secondary | ICD-10-CM

## 2012-06-15 DIAGNOSIS — Z113 Encounter for screening for infections with a predominantly sexual mode of transmission: Secondary | ICD-10-CM | POA: Insufficient documentation

## 2012-06-15 DIAGNOSIS — D472 Monoclonal gammopathy: Secondary | ICD-10-CM

## 2012-06-15 MED ORDER — HYDROCODONE-ACETAMINOPHEN 5-500 MG PO TABS: 0.5000 | ORAL_TABLET | Freq: Three times a day (TID) | ORAL | Status: DC | PRN

## 2012-06-15 NOTE — Progress Notes (Signed)
Patient ID: William [name], male   DOB: 10-08-1961, 51 y.o.   MRN: [id number]  Subjective:    Patient ID: William [name], male    DOB: [birth year], 51 y.o.   MRN: [id number]  Shoulder Pain   Hypertension   Hypertension Blood pressure at home: Not checking Blood pressure today: 140/90 Taking Meds: No did not get amlodipine yet from pharmacy. Side effects: No ROS: Denies headache visual changes nausea, vomiting, chest pain or abdominal pain or shortness of breath.   Chronic kidney disease-  Lab Results  Component Value Date   CREATININE 1.57* 02/07/2012   been fairly constant in the low at 1.7 range. We'll continue to monitor recheck again in 2 months. Patient denies any lower back pain is making plenty of urine. We'll need to consider starting any ACE inhibitor for kidney protection effects but last time when done patient's creatinine became elevated into the 2.0 range Chronic Pain Follow-Up Assessment Chronic Pain Diagnosis: knee pain Pain scale rating at its BEST in the past 24 hours (1 to 10): 4 Pain scale rating at its WORST in the past 24 hours (1 to 10): 8 Adverse Effects:  (None_X_ Nausea__ Vomiting__ Confusion__ Sleepiness__ Fatigue__ Constipation__ Other__) Treatment of Adverse Effects: Since the last clinic visit, how much relief have pain treatment and medication provided? Please circle the one percentage that shows how much relief you have received: 0%__ 10%__ 20%__ 30%_X_ 40%__ 50%__ 60%__ 70%__ 80%__ 90%__ 100%__ [name] the one number that describes how, during the past 24 hours, pain has interfered with your: General Activity 0__ 1__ 2__ 3__ 4__ 5_X_ 6__ 7__ 8__ 9__ 10__ Does not interfere      Completely interferes Mood 0__ 1__ 2__ 3__ 4__ 5__ 6_X_ 7__ 8__ 9__ 10__ Does not interfere      Completely interferes Ability to work (in or out of the home) 0__ 1__ 2__ 3__ 4__ 5X__ 6__ 7__ 8__ 9__ 10__ Does not interfere      Completely interferes Interactions with other  people 0__ 1__ 2__ 3__ 4__ 5__ 6_X_ 7__ 8__ 9__ 10__ Does not interfere      Completely interferes Sleep 0__ 1__ 2__ 3__ 4__ 5__ 6_X_ 7__ 8__ 9__ 10__ Does not interfere      Completely interferes  Enjoyment of life 0__ 1__ 2__ 3__ 4__ 5__ 6_X_ 7__ 8__ 9__ 10__ Does not interfere      Completely interferes   Review of Systems denies fever, chills, abdominal pain, hematuria, or trouble with bowel movements.    Objective:   Physical Exam vitals reviewed Gen: No apparent distress CV: Regular in rhythm no murmur Pul CTAB Abd: BS+, NT,, ND  Knee: Normal to inspection with no erythema or effusion or obvious bony abnormalities. Palpation normal with no warmth or joint line tenderness or patellar tenderness or condyle tenderness. ROM normal in flexion and extension and lower leg rotation. Ligaments with solid consistent endpoints including ACL, PCL, LCL, MCL. Negative Mcmurray's and provocative meniscal tests. Non painful patellar compression. Patellar and quadriceps tendons unremarkable. Hamstring and quadriceps strength is normal.       Assessment & Plan:

## 2012-06-15 NOTE — Patient Instructions (Addendum)
It is good to see you. I think he strained a muscle in her calf. Patient continued better over the course of the next 1-2 weeks. Please come back and see her new primary care provider. I will call you with the lab results that were taken today. Please make an appointment with Dr. Raymondo Band for 24-hour blood pressure monitoring.  What to expect at your blood pressure monitoring visit:  Please wear a short-sleeved, loose fitting shirt for the day. Try to take a shower/bathe before you come in for the appointment so that you don't have to take the monitor off during the day. On the day of your appointment, a portable blood pressure cuff will be placed on your arm and you will wear it for 24 hours. It will inflate multiple times during the day and night which will give [place] a better idea of your overall blood pressure. The next day you will return to the clinic to take the monitor off and the results of the blood pressure study will be shown to you. If medication changes are needed, they will be made that day.   I have refilled your Vicodin that he had on hand but please try to keep decreasing use. It has been a pleasure getting to know you.

## 2012-06-15 NOTE — Assessment & Plan Note (Signed)
Still elevated, patient been hard to take care of will come back for ambulatory blood pressure monitoring.

## 2012-06-27 ENCOUNTER — Encounter: Payer: Self-pay | Admitting: Family Medicine

## 2012-06-27 ENCOUNTER — Ambulatory Visit (INDEPENDENT_AMBULATORY_CARE_PROVIDER_SITE_OTHER): Payer: Self-pay | Admitting: Family Medicine

## 2012-06-27 VITALS — BP 148/89 | HR 106 | Ht 71.0 in | Wt 193.0 lb

## 2012-06-27 DIAGNOSIS — M25511 Pain in right shoulder: Secondary | ICD-10-CM

## 2012-06-27 DIAGNOSIS — I10 Essential (primary) hypertension: Secondary | ICD-10-CM

## 2012-06-27 DIAGNOSIS — M25519 Pain in unspecified shoulder: Secondary | ICD-10-CM

## 2012-06-27 DIAGNOSIS — M109 Gout, unspecified: Secondary | ICD-10-CM | POA: Insufficient documentation

## 2012-06-27 DIAGNOSIS — K7460 Unspecified cirrhosis of liver: Secondary | ICD-10-CM

## 2012-06-27 MED ORDER — AMLODIPINE BESYLATE 5 MG PO TABS: 10.0000 mg | ORAL_TABLET | Freq: Every day | ORAL | Status: DC

## 2012-06-27 MED ORDER — COLCHICINE 0.6 MG PO TABS: 0.3000 mg | ORAL_TABLET | Freq: Every day | ORAL | Status: DC

## 2012-06-27 MED ORDER — AMLODIPINE BESYLATE 10 MG PO TABS: 10.0000 mg | ORAL_TABLET | Freq: Every day | ORAL | Status: DC

## 2012-06-27 MED ORDER — PREDNISONE 50 MG PO TABS: 50.0000 mg | ORAL_TABLET | Freq: Every day | ORAL | Status: DC

## 2012-06-27 NOTE — Patient Instructions (Addendum)
Dear William [name],   It was great to see you today. Thank you for coming to clinic. Please read below regarding the issues that we discussed.   1. For your arm pain, I want you to take 300mg  of Gabapentin each night. Follow up with me in 2 weeks and since a shoulder injection helped you before we can consider it at that point. I think this could be related to your neck pain as well, so I am going to send in for some steroids which reduce inflammation.  2. For your gout, I am going to get a uric acid level today. I am going to send in for medicine for you. They should be available by tomorrow morning. 3. I have increased your amlodipine to 10mg  for your blood pressure.   Please follow up in clinic in 2-3 weeks. Please call earlier if you have any questions or concerns.   Sincerely,  Dr. Tana Conch

## 2012-06-27 NOTE — Progress Notes (Signed)
  Subjective:    Patient ID: William [name], male    DOB: 02/21/1961, 51 y.o.   MRN: [id number]  HPI   1. Right neck and arm pain  History--patient reports he has had longstanding pain in his neck and his left arm radiating all the way down to his wrist. Imaging in January showed degenerative disc disease and some foraminal narrowing c5-c6, c6-c7. Despite a potential neuropathic element, the patient responded well to a shoulder injection at that time. He still has some pain in that arm but overall he can function with it and it is much improved.   Acute issue-over the last 2 weeks the patient has developed pain on the right side of his neck which radiates down to his right wrist but not into his fingertips. Described as an aching or burning up to 9/10 pain. It is making it difficult for him to sleep.  Was prescribed vicodin 2 weeks ago by Dr. Katrinka Blazing for knee pain. He says that he has been taking a double dose but it is not helping his arm pain. No numbness/tingling.   Of note, patient has gabapentin prescribed he says for some numbness in his feet but he rarely takes it.   2. Left first toe pain-metatarsal-phalangeal joint increasingly painful over last few days. Has also got warm to touch as well as tender to touch. Patient with history of gout. Not currently on allopurinol or other medications. Patient denies fever.   3. HTN-compliant with amlodipine. BP still elevated.   Review of Systems -See HPI  Past Medical History-smoking status noted: not a current smoker. Reviewed problem list.  Medications- reviewed and updated Chief complaint-noted      Objective:   Physical Exam BP 148/89  Pulse 106  Ht 5\' 11"  (1.803 m)  Wt 193 lb (87.544 kg)  BMI 26.92 kg/m2    MSK/neuro exam: Gait-patient moves slowly and appears to be uncomfortable.  Right Shoulder: Inspection reveals no abnormalities, atrophy or asymmetry when compared to left.  Palpation is normal with no tenderness over AC  joint or bicipital groove. ROM is decreased in abduction as well as forward flexion. When encouraged, patient can move further, states he is limited by pain, not by inability to move.  Rotator cuff strength normal throughout. + Neer and Hawkin's tests, empty can. Bilaterally-patient experiences pain with movement.  Spurling's test negative-patient reports pain in neck but no radiation into extremities.   5/5 muscle strength with finger squeeze, 4/5 biceps on right secondary to pain, 5/5 on left. 2+ reflexes in biceps. 2+ pulses bilaterally. Intact sensation to fine touch and pinprick.   Left first toe-metatarsalphalangeal joint warm, red, tender to touch.      Assessment & Plan:

## 2012-06-27 NOTE — Assessment & Plan Note (Addendum)
Concerned patient may be having radiculopathy from foraminal stenosis. Spurling test + neck pain but not + given no reproduction of arm symptoms but not high sensitivity.  Patient with previous imaging showing this as well as degenerative changes. Will try a burst of steroids. Patient can use gabapentin at night as well for neuropathic symptoms.

## 2012-06-27 NOTE — Assessment & Plan Note (Addendum)
History of gout. Will attempt colchicine for 2 weeks at lower dose given hepatic/renal issues.  Obtain uric acid. May need to consider allopurinol. No fevers/nausea/vomiting to suggest cellulitis or systemic infection.

## 2012-06-27 NOTE — Assessment & Plan Note (Signed)
Still not at goal. Increase amlodipine to 10mg .

## 2012-06-28 LAB — URIC ACID: Uric Acid, Serum: 11 mg/dL — ABNORMAL HIGH (ref 4.0–7.8)

## 2012-06-29 ENCOUNTER — Telehealth: Payer: Self-pay | Admitting: Family Medicine

## 2012-06-29 NOTE — Telephone Encounter (Signed)
Patient is calling because the Rx for Colcichine that was prescribed on 7/3, needs to go to [facility] instead of [facility].  He would like a call when this has been done.

## 2012-06-29 NOTE — Telephone Encounter (Signed)
Called patient and ask him to contact [facility] and ask to have RX transferred.

## 2012-07-18 ENCOUNTER — Telehealth: Payer: Self-pay | Admitting: Family Medicine

## 2012-07-18 DIAGNOSIS — M109 Gout, unspecified: Secondary | ICD-10-CM

## 2012-07-18 MED ORDER — COLCHICINE 0.6 MG PO TABS: 0.3000 mg | ORAL_TABLET | Freq: Every day | ORAL | Status: DC

## 2012-07-18 NOTE — Telephone Encounter (Signed)
Refilled colchicine but patient needs appointment to discuss allopurinol as uric acid elevated. Will ask nursing staff to call patient to schedule appointment within next week.

## 2012-07-18 NOTE — Telephone Encounter (Signed)
Pt informed. William [name]  

## 2012-07-27 ENCOUNTER — Ambulatory Visit: Payer: Self-pay | Admitting: Family Medicine

## 2012-08-06 ENCOUNTER — Ambulatory Visit (INDEPENDENT_AMBULATORY_CARE_PROVIDER_SITE_OTHER): Payer: Medicare Other | Admitting: Family Medicine

## 2012-08-06 ENCOUNTER — Encounter: Payer: Self-pay | Admitting: Family Medicine

## 2012-08-06 VITALS — BP 144/98 | HR 99 | Temp 98.4°F | Ht 71.0 in | Wt 192.0 lb

## 2012-08-06 DIAGNOSIS — S32009A Unspecified fracture of unspecified lumbar vertebra, initial encounter for closed fracture: Secondary | ICD-10-CM

## 2012-08-06 DIAGNOSIS — M25512 Pain in left shoulder: Secondary | ICD-10-CM

## 2012-08-06 DIAGNOSIS — K7200 Acute and subacute hepatic failure without coma: Secondary | ICD-10-CM

## 2012-08-06 DIAGNOSIS — K7030 Alcoholic cirrhosis of liver without ascites: Secondary | ICD-10-CM

## 2012-08-06 DIAGNOSIS — M109 Gout, unspecified: Secondary | ICD-10-CM

## 2012-08-06 DIAGNOSIS — I10 Essential (primary) hypertension: Secondary | ICD-10-CM

## 2012-08-06 DIAGNOSIS — M25511 Pain in right shoulder: Secondary | ICD-10-CM

## 2012-08-06 DIAGNOSIS — D594 Other nonautoimmune hemolytic anemias: Secondary | ICD-10-CM

## 2012-08-06 DIAGNOSIS — N183 Chronic kidney disease, stage 3 unspecified: Secondary | ICD-10-CM

## 2012-08-06 DIAGNOSIS — M25519 Pain in unspecified shoulder: Secondary | ICD-10-CM

## 2012-08-06 LAB — COMPREHENSIVE METABOLIC PANEL
ALT: 24 U/L (ref 0–53)
AST: 49 U/L — ABNORMAL HIGH (ref 0–37)
Albumin: 4.5 g/dL (ref 3.5–5.2)
BUN: 19 mg/dL (ref 6–23)
CO2: 23 mEq/L (ref 19–32)
Calcium: 9.8 mg/dL (ref 8.4–10.5)
Chloride: 100 mEq/L (ref 96–112)
Creat: 1.42 mg/dL — ABNORMAL HIGH (ref 0.50–1.35)
Potassium: 3.9 mEq/L (ref 3.5–5.3)

## 2012-08-06 LAB — LDL CHOLESTEROL, DIRECT: Direct LDL: 81 mg/dL

## 2012-08-06 MED ORDER — LISINOPRIL 10 MG PO TABS: 10.0000 mg | ORAL_TABLET | Freq: Every day | ORAL | Status: DC

## 2012-08-06 MED ORDER — FEBUXOSTAT 40 MG PO TABS: 40.0000 mg | ORAL_TABLET | Freq: Every day | ORAL | Status: DC

## 2012-08-06 NOTE — Assessment & Plan Note (Signed)
Poorly controlled. Started lisinopril 10mg . Follow up in 2 weeks. Also check direct LDL. May need statin.

## 2012-08-06 NOTE — Progress Notes (Signed)
Subjective:   1. Arm and neck pain: R sided arm and neck pain-patient states resolved with prednisone burst.  L arm and neck pain-continues but more tolerable than previously. Only taking gabapentin prn but states that it helps.   2. Hypertension- BP Readings from Last 3 Encounters:  08/06/12 144/98  06/27/12 148/89  06/15/12 150/90   Home BP monitoring-no Compliant with medications-yes without side effects Denies any CP, HA, SOB, blurry vision, LE edema, transient weakness (outside of chronic left arm difficulities), orthopnea, PND.  No recent lipid screening.   3. Gout-uric acid at last visit was 11. Started colcrys about a week ago and it has drastically improved pain.   ROS--See HPI  Past Medical History-CKD, stage III. Alcoholic Cirrhosis. History MGUS Reviewed problem list.  Medications- reviewed and updated Chief complaint-noted  Objective:  Gen: NAD CV: RRR no mrg Lungs: CTAB MSK: Left arm-Inspection reveals no abnormalities, atrophy or asymmetry. Patient able to abduct and forward flex both right and left arm when previously unable. 5/5 muscle strength with finger squeeze, biceps. 2+ reflexes in biceps. 2+ pulses bilaterally. Intact sensation to fine touch and pinprick.  Left first toe-metatarsalphalangeal joint warm, red, tender to touch.  Extremities: no edema  Assessment/Plan: See problem oriented charted  Next visit: follow up on alcohol use

## 2012-08-06 NOTE — Assessment & Plan Note (Signed)
Resolved with steroid burst.

## 2012-08-06 NOTE — Assessment & Plan Note (Signed)
Stopped lasix. Will avoid HCTZ for HTN. Will attempt uloric given kidney and liver issues. Will also forward case to pharmacy for input as concerned patient cannot afford uloric and does not appear to be good candidate for allopurinol.

## 2012-08-06 NOTE — Assessment & Plan Note (Addendum)
Encouraged exercises. Patient says has already seen PT and does not want to see again. Advised neck and shoulder strengthening exercises. If has another flare, consider sports medicine referral. Gabapentin to be used regularly, not prn.

## 2012-08-06 NOTE — Patient Instructions (Signed)
i would like to see you back in 2 weeks. We are going to check some labs today and repeat some of these in 2 weeks. I want you to STOP taking lasix/furosemide. I want you to start taking lisinopril to protect your kidney. I am also ordering uloric to help prevent gout attacks in the future. Take your colcrys until it runs otu. I am going to check your cholesterol today as well.   Keep doing exercises prescribed by physical therapy. And also start doing the lawnmower exercise we discussed and strengthen your shoulder muscles doing the exercises we discussed (lateral and forward raises) using lightweight such as canned vegetables.

## 2012-08-06 NOTE — Assessment & Plan Note (Addendum)
BP continues to be elevated. This could contribute to CKD so will seek tighter control. Start ace inhibitor for renal protective effects. CMET today and repeat in 2 weeks.   Crcl calculated and actually 64 which is consistent with stage II at most recent Check BMET today.

## 2012-08-07 ENCOUNTER — Telehealth: Payer: Self-pay | Admitting: Family Medicine

## 2012-08-07 ENCOUNTER — Encounter: Payer: Self-pay | Admitting: Family Medicine

## 2012-08-07 NOTE — Telephone Encounter (Signed)
Left voicemail asking if patient able to afford/pick up medications.   -I do not think he will likely be able to afford Uloric. Have discussed with pharmacy and believe could start low dose Allopurinol given improved Cr (GFR approx 75). If cannot afford uloric, will start allopurinol at next visit to help control gout attacks.

## 2012-08-07 NOTE — Telephone Encounter (Signed)
Patient is calling because the meds that were prescribed yesterday were over $200 and he would like samples or something that will be less expensive.  He said that it is ok to leave a message if he doesn't answer.

## 2012-08-09 ENCOUNTER — Telehealth: Payer: Self-pay | Admitting: Family Medicine

## 2012-08-09 DIAGNOSIS — M109 Gout, unspecified: Secondary | ICD-10-CM

## 2012-08-09 MED ORDER — COLCHICINE 0.6 MG PO TABS: 0.3000 mg | ORAL_TABLET | Freq: Every day | ORAL | Status: DC

## 2012-08-09 MED ORDER — ALLOPURINOL 100 MG PO TABS: 50.0000 mg | ORAL_TABLET | Freq: Every day | ORAL | Status: DC

## 2012-08-09 NOTE — Telephone Encounter (Signed)
After review of most recent GFR of approximately 70, believe patient can tolerate low dose allopurinol, colchicine and initiation of ace-i. Have sent in 1/2 doses of allopurinol to [facility] and colchicine printed and placed at front desk.   Will monitor LFTs  And other liver related function (pt/ptt/inr, albumin) closely, once in 2 weeks as well as 1 month after that if stable. Will also monitor Cr.

## 2012-08-20 ENCOUNTER — Encounter: Payer: Self-pay | Admitting: Family Medicine

## 2012-08-20 ENCOUNTER — Ambulatory Visit (INDEPENDENT_AMBULATORY_CARE_PROVIDER_SITE_OTHER): Payer: Self-pay | Admitting: Family Medicine

## 2012-08-20 VITALS — BP 145/90 | HR 68 | Temp 98.6°F | Ht 71.0 in | Wt 195.0 lb

## 2012-08-20 DIAGNOSIS — M109 Gout, unspecified: Secondary | ICD-10-CM

## 2012-08-20 DIAGNOSIS — F1010 Alcohol abuse, uncomplicated: Secondary | ICD-10-CM

## 2012-08-20 DIAGNOSIS — I10 Essential (primary) hypertension: Secondary | ICD-10-CM

## 2012-08-20 DIAGNOSIS — K7030 Alcoholic cirrhosis of liver without ascites: Secondary | ICD-10-CM

## 2012-08-20 DIAGNOSIS — Z8719 Personal history of other diseases of the digestive system: Secondary | ICD-10-CM

## 2012-08-20 MED ORDER — PROPRANOLOL HCL 40 MG PO TABS: 40.0000 mg | ORAL_TABLET | Freq: Two times a day (BID) | ORAL | Status: DC

## 2012-08-20 NOTE — Assessment & Plan Note (Signed)
Well controlled. Cant check uric acid as has not had 2 weeks to normalize level. Likely will need allopurinol increase.

## 2012-08-20 NOTE — Assessment & Plan Note (Addendum)
CMET/PT/PTT/INR and CBC to monitor platelets. Deferred at this time due to needing blood draw at next appointment.   Encouraged patient to restart Miralax.   Working with nursing to obtain vaccination history for Hepatitis. Could get Hep B panel (need Hbsag, anti-HBc, and anti-HBs (only positive one))   Patient with negative Hep c screening 03/20/10.   Consider 6 mo-yearly ultrasound for HCC.

## 2012-08-20 NOTE — Progress Notes (Signed)
Subjective:   1. 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 Strongly encouraged patient to stop and he has not had a drink since last visit.  Patient last seen by GI Dr. Elnoria Howard 07/19/10. It was stated he would be discharged from practice if relapsed on alcohol which patient has but does not appear he ever went back. Patient was on lasix 20mg  at that time but no spironolactone and no recommendations to start. Patient was on miralax TID due to not being able to afford lactulose but he states he stopped that sometime in the last few months although he did not previously report this. He states still having 1 BM per day.   Negative Hep C antibody in 03/20/10 qualifies for 1x screening for demographic.   2. esophageal varices .  Patient does not know when he stopped taking a beta blocker which he was prescribed at that time.  3. Hypertension- BP Readings from Last 3 Encounters:  08/20/12 145/90  08/06/12 144/98  06/27/12 148/89  Home BP monitoring-no Compliant with medications-yes but states since starting lisinopril has had intermittent lightheadedness (not orthostatic related) and also throat feels dry and seems to be having more frequent cough (although confounded by recent URI). Patient is not tolerating medication and would like to change.   Denies any CP, HA, SOB, changes in blurry vision (patient states he has had blurry vision and requires reading glasses and thinks may need long distance glasses) , LE edema, transient weakness, orthopnea, PND.   4. Gout-just started taking allopurinol 3 days ago. No flare at this time.   ROS--See HPI  Past Medical History-nonsmoker.  Reviewed problem list.  Medications- reviewed and updated Chief complaint-noted  Objective: BP 145/90  Pulse 68  Temp 98.6 F (37 C) (Oral)  Ht 5\' 11"  (1.803 m)  Wt 195 lb (88.451 kg)  BMI 27.20 kg/m2 Gen: NAD CV: RRR no mrg Lungs: CTAB Abd: no  obvious ascites Extremities: no edema  Assessment/Plan: See problem oriented charted

## 2012-08-20 NOTE — Assessment & Plan Note (Addendum)
Reportedly quit after last visit-praised patient for efforts. Follow closely. Strongly encouraged AA but patient resistant.

## 2012-08-20 NOTE — Assessment & Plan Note (Signed)
Start Propranolol for esophageal varices co treatment. Stopped lisinopril-had previously stopped lasix. Could consider spironalactone or ARB if not controlled next visit.

## 2012-08-20 NOTE — Assessment & Plan Note (Signed)
Started propranolol.

## 2012-08-20 NOTE — Patient Instructions (Addendum)
Dear William [name],   It was great to see you today. Thank you for coming to clinic. Please read below regarding the issues that we discussed.   1. Please bring all medications to your next visit so we can review what to take and not to take.  2. I want you to stop taking Lisinopril. We are going to start a medication, propranolol, that you used to be on to protect you from esophageal bleeding and help your blood pressure.  3. I would strongly encourage you to look into an alcoholics anonymous group. These are great groups that help people not drink in the future. The most important thing for your cirrhosis is for you to NEVER drink again.  4. We are going to look into your vaccination history and try to get you up to date with all hepatitis vaccines and screening.  5. Restart your miralax to use three time a day. You should aim to have 2-3 bowel movements per day.   Please follow up in clinic in 2 weeks to do follow up labs and follow up your blood pressure. Please call earlier if you have any questions or concerns.   Sincerely,  Dr. Tana Conch

## 2012-09-03 ENCOUNTER — Ambulatory Visit: Payer: Self-pay | Admitting: Family Medicine

## 2012-09-07 ENCOUNTER — Telehealth: Payer: Self-pay | Admitting: Oncology

## 2012-09-07 ENCOUNTER — Other Ambulatory Visit (HOSPITAL_BASED_OUTPATIENT_CLINIC_OR_DEPARTMENT_OTHER): Payer: Medicare Other

## 2012-09-07 ENCOUNTER — Ambulatory Visit (HOSPITAL_BASED_OUTPATIENT_CLINIC_OR_DEPARTMENT_OTHER): Payer: Medicare Other | Admitting: Oncology

## 2012-09-07 VITALS — BP 160/100 | HR 86 | Temp 97.8°F | Resp 20 | Ht 71.0 in | Wt 194.2 lb

## 2012-09-07 DIAGNOSIS — D696 Thrombocytopenia, unspecified: Secondary | ICD-10-CM

## 2012-09-07 DIAGNOSIS — K769 Liver disease, unspecified: Secondary | ICD-10-CM

## 2012-09-07 DIAGNOSIS — R748 Abnormal levels of other serum enzymes: Secondary | ICD-10-CM

## 2012-09-07 DIAGNOSIS — K7030 Alcoholic cirrhosis of liver without ascites: Secondary | ICD-10-CM

## 2012-09-07 LAB — COMPREHENSIVE METABOLIC PANEL (CC13)
AST: 41 U/L — ABNORMAL HIGH (ref 5–34)
Albumin: 3.8 g/dL (ref 3.5–5.0)
BUN: 12 mg/dL (ref 7.0–26.0)
CO2: 21 mEq/L — ABNORMAL LOW (ref 22–29)
Calcium: 9.6 mg/dL (ref 8.4–10.4)
Chloride: 106 mEq/L (ref 98–107)
Creatinine: 1.4 mg/dL — ABNORMAL HIGH (ref 0.7–1.3)
Glucose: 94 mg/dl (ref 70–99)
Potassium: 3.8 mEq/L (ref 3.5–5.1)

## 2012-09-07 LAB — CBC WITH DIFFERENTIAL/PLATELET
EOS%: 3.1 % (ref 0.0–7.0)
MCH: 32.3 pg (ref 27.2–33.4)
MCV: 97.1 fL (ref 79.3–98.0)
MONO%: 8.2 % (ref 0.0–14.0)
NEUT#: 1.7 10*3/uL (ref 1.5–6.5)
RBC: 3.98 10*6/uL — ABNORMAL LOW (ref 4.20–5.82)
RDW: 15.2 % — ABNORMAL HIGH (ref 11.0–14.6)
lymph#: 0.8 10*3/uL — ABNORMAL LOW (ref 0.9–3.3)

## 2012-09-07 NOTE — Progress Notes (Signed)
Hematology and Oncology Follow Up Visit  William [name] [id number] 14-Sep-1961 51 y.o. 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 This is likely reactive due to liver disease. Work up did not reveal a plasma cell disorder in 01/2012.    Current therapy: Observation and surveillance  Interim History: William [name] presents today for a followup visit. He is a gentleman I saw for the first time in February of 2013. He is a gentleman with history of liver disease and an evaluation for a possible plasma cell disorder. His workup did not reveal any clear-cut end organ damage to suggest a multiple myeloma. His urine electrophoresis did show slight elevation of kappa to lambda ratio at 11.64. His serum electrophoresis did not show a monoclonal protein. Clinically William [name] does not report any new symptoms. He continued to have back pain but no pathological fractures no infectious process or hospitalizations. His skeletal survey was also negative on 01/2011.  No bleeding complications noted since the last visit.    Medications: I have reviewed the patient's current medications. Current outpatient prescriptions:allopurinol (ZYLOPRIM) 100 MG tablet, Take 0.5 tablets (50 mg total) by mouth daily., Disp: 30 tablet, Rfl: 6;  amLODipine (NORVASC) 10 MG tablet, Take 1 tablet (10 mg total) by mouth daily., Disp: 90 tablet, Rfl: 3;  calcium carbonate (OS-CAL) 600 MG TABS, Take 600 mg by mouth daily.  , Disp: , Rfl: ;  colchicine 0.6 MG tablet, Take 0.5 tablets (0.3 mg total) by mouth daily., Disp: 15 tablet, Rfl: 0 ferrous sulfate 325 (65 FE) MG EC tablet, Take 325 mg by mouth daily with breakfast.  , Disp: , Rfl: ;  folic acid (FOLVITE) 1 MG tablet, Take 1 tablet (1 mg total) by mouth daily., Disp: 90 tablet, Rfl: 1;  gabapentin (NEURONTIN)  100 MG capsule, Take 100 mg in morning 100 mg in the afternoon and 300 mg at night, Disp: 450 capsule, Rfl: 2 hydrOXYzine (ATARAX/VISTARIL) 25 MG tablet, Take 1 tablet (25 mg total) by mouth every 8 (eight) hours as needed for itching., Disp: 90 tablet, Rfl: 1;  propranolol (INDERAL) 40 MG tablet, Take 1 tablet (40 mg total) by mouth 2 (two) times daily., Disp: 60 tablet, Rfl: 5;  sodium bicarbonate 650 MG tablet, Take 1 tablet (650 mg total) by mouth 2 (two) times daily., Disp: 60 tablet, Rfl: 6 esomeprazole (NEXIUM) 40 MG capsule, Take 1 capsule (40 mg total) by mouth daily before breakfast., Disp: 90 capsule, Rfl: 3;  febuxostat (ULORIC) 40 MG tablet, Take 1 tablet (40 mg total) by mouth daily., Disp: 30 tablet, Rfl: 3;  HYDROcodone-acetaminophen (VICODIN) 5-500 MG per tablet, Take 0.5 tablets by mouth every 8 (eight) hours as needed for pain., Disp: 45 tablet, Rfl: 0 magnesium oxide (MAG-OX 400) 400 MG tablet, Take 1 tablet (400 mg total) by mouth daily., Disp: 200 tablet, Rfl: 1;  polyethylene glycol (MIRALAX / GLYCOLAX) packet, Take 17 g by mouth 2 (two) times daily., Disp: 56 each, Rfl: 0  Allergies: No Known Allergies  Past Medical History, Surgical history, Social history, and Family History were reviewed and updated.  Review of Systems: Constitutional:  Negative for fever, chills, night sweats, anorexia, weight loss, pain. Cardiovascular: no chest pain or dyspnea on exertion Respiratory: no cough, shortness of breath, or wheezing Neurological: no TIA or stroke symptoms Dermatological:  negative ENT: negative Skin: Negative. Gastrointestinal: no abdominal pain, change in bowel habits, or black or bloody stools Genito-Urinary: negative Hematological and Lymphatic: negative Breast: negative Musculoskeletal: negative Remaining ROS negative. Physical Exam: Blood pressure 160/100, pulse 86, temperature 97.8 F (36.6 C), temperature source Oral, resp. rate 20, height 5\' 11"  (1.803 m),  weight 194 lb 3.2 oz (88.089 kg). ECOG: 1 General appearance: alert Head: Normocephalic, without obvious abnormality, atraumatic Neck: no adenopathy, no carotid bruit, no JVD, supple, symmetrical, trachea midline and thyroid not enlarged, symmetric, no tenderness/mass/nodules Lymph nodes: Cervical, supraclavicular, and axillary nodes normal. Heart:regular rate and rhythm, S1, S2 normal, no murmur, click, rub or gallop Lung:chest clear, no wheezing, rales, normal symmetric air entry Abdomin: soft, non-tender, without masses or organomegaly EXT:no erythema, induration, or nodules   Lab Results: Lab Results  Component Value Date   WBC 2.9* 09/07/2012   HGB 12.8* 09/07/2012   HCT 38.7 09/07/2012   MCV 97.1 09/07/2012   PLT 80* 09/07/2012     Chemistry      Component Value Date/Time   NA 137 08/06/2012 1142   K 3.9 08/06/2012 1142   CL 100 08/06/2012 1142   CO2 23 08/06/2012 1142   BUN 19 08/06/2012 1142   CREATININE 1.42* 08/06/2012 1142   CREATININE 1.57* 02/07/2012 1301      Component Value Date/Time   CALCIUM 9.8 08/06/2012 1142   ALKPHOS 305* 08/06/2012 1142   AST 49* 08/06/2012 1142   ALT 24 08/06/2012 1142   BILITOT 1.3* 08/06/2012 1142       Impression and Plan: 51 year old gentleman with: 1.  Polyclonal elevation in his gamma globulins in the serum; however, he does have a monoclonal free kappa light chain in the urine. 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 Reactive due to his liver disease. There is evidence of multiple myeloma at this point.  The plan is to continue follow him and repeat the labs in 6 months. If there are changes to suggest end organ damage, then I will restage him at that time.   2.  Liver disease: he is following with PCP regarding that.   3. Thrombocytopenia:This is due to his liver disease. Stable at this point. No bleeding noted.      Eli Hose, MD 9/13/201312:10 PM

## 2012-09-07 NOTE — Telephone Encounter (Signed)
gve the pt his march 2014 appt calendar °

## 2012-09-11 ENCOUNTER — Encounter: Payer: Self-pay | Admitting: Family Medicine

## 2012-09-11 ENCOUNTER — Ambulatory Visit (INDEPENDENT_AMBULATORY_CARE_PROVIDER_SITE_OTHER): Payer: Medicare Other | Admitting: Family Medicine

## 2012-09-11 VITALS — BP 158/94 | HR 61 | Temp 98.3°F | Ht 71.0 in | Wt 196.0 lb

## 2012-09-11 DIAGNOSIS — I10 Essential (primary) hypertension: Secondary | ICD-10-CM

## 2012-09-11 DIAGNOSIS — K7030 Alcoholic cirrhosis of liver without ascites: Secondary | ICD-10-CM

## 2012-09-11 DIAGNOSIS — Z23 Encounter for immunization: Secondary | ICD-10-CM

## 2012-09-11 DIAGNOSIS — F1010 Alcohol abuse, uncomplicated: Secondary | ICD-10-CM

## 2012-09-11 DIAGNOSIS — M109 Gout, unspecified: Secondary | ICD-10-CM

## 2012-09-11 DIAGNOSIS — R160 Hepatomegaly, not elsewhere classified: Secondary | ICD-10-CM

## 2012-09-11 LAB — APTT: aPTT: 30 seconds (ref 24–37)

## 2012-09-11 LAB — PROTIME-INR
INR: 1.09
Prothrombin Time: 14.1 s (ref 11.6–15.2)

## 2012-09-11 MED ORDER — SPIRONOLACTONE 25 MG PO TABS: 12.5000 mg | ORAL_TABLET | Freq: Every day | ORAL | Status: DC

## 2012-09-11 NOTE — Assessment & Plan Note (Signed)
Well controlled. Check uric acid today.

## 2012-09-11 NOTE — Assessment & Plan Note (Addendum)
Poorly controlled. Will start spironolactone and have patient back in 1 week to repeat BMET and for BP recheck (may need to titrate up both BP meds)

## 2012-09-11 NOTE — Progress Notes (Signed)
Subjective:    1. Hypertension-changed from ace inhibitor which patient was not tolerating at last visit to BID beta blocker.  BP Readings from Last 3 Encounters:  09/11/12 158/94  09/07/12 160/100  08/20/12 145/90  Home BP monitoring-no Compliant with medications-yes  Denies any CP, HA, SOB, changes in blurry vision (patient states he has had blurry vision and requires reading glasses and thinks may need long distance glasses) , LE edema, transient weakness, orthopnea, PND.   2. Alcohol/abuse/Alcoholic Cirrhoses with history of hepatic encephalopathy-reports a slip up and having 1 beer when watching the boxing fight over the weekend. Stressed to patient that he cannot relapse at all. Highly encouraged him to follow up with alcoholics anonymous.   Patient is tolerating miralax use and having 2-3 looser yet formed stools each day.   3. esophageal varices. Tolerating beta blocker without side effects. No chest pain, dark tarry stools, BRBPR or coughing up blood.    4. Gout-no further flares while taking allopurinol and colchicine.    ROS--See HPI  Past Medical History-nonsmoker.  Reviewed problem list.  Medications- reviewed and updated Chief complaint-noted  Objective: BP 158/94  Pulse 61  Temp 98.3 F (36.8 C) (Oral)  Ht 5\' 11"  (1.803 m)  Wt 196 lb (88.905 kg)  BMI 27.34 kg/m2 Gen: NAD CV: RRR no mrg Lungs: CTAB Abd: no obvious ascites, hepatomegaly noted 2 cm below costal margin.  Extremities: no edema  Assessment/Plan: See problem oriented charted

## 2012-09-11 NOTE — Assessment & Plan Note (Signed)
Encouraged COMPLETE cessation of alcoholic beverages. Stressed that this could shorten patient's lifespan.

## 2012-09-11 NOTE — Patient Instructions (Signed)
We are going to check some labs today. I will call you if we need to change anything.  I am starting you on a low dose blood pressure medicine that helps with cirrhosis.  I want you to make an appointment for a lab visit and blood pressure check in 1 week.  I would like to see you back in 1 month.

## 2012-09-11 NOTE — Assessment & Plan Note (Addendum)
Continue beta blocker and start spironolactone for hypertension and cirrhosis.   Checking hep B status to determine if needs both series of shots.

## 2012-09-12 LAB — SPEP & IFE WITH QIG
Beta 2: 4.8 % (ref 3.2–6.5)
Beta Globulin: 7.1 % (ref 4.7–7.2)
Gamma Globulin: 20.2 % — ABNORMAL HIGH (ref 11.1–18.8)
IgA: 327 mg/dL (ref 68–379)
IgG (Immunoglobin G), Serum: 1970 mg/dL — ABNORMAL HIGH (ref 650–1600)
IgM, Serum: 136 mg/dL (ref 41–251)

## 2012-09-12 LAB — URIC ACID: Uric Acid, Serum: 7.3 mg/dL (ref 4.0–7.8)

## 2012-09-17 ENCOUNTER — Encounter: Payer: Self-pay | Admitting: *Deleted

## 2012-09-17 ENCOUNTER — Other Ambulatory Visit: Payer: Medicare Other

## 2012-09-17 ENCOUNTER — Ambulatory Visit (INDEPENDENT_AMBULATORY_CARE_PROVIDER_SITE_OTHER): Payer: Medicare Other | Admitting: *Deleted

## 2012-09-17 VITALS — BP 150/94

## 2012-09-17 DIAGNOSIS — I10 Essential (primary) hypertension: Secondary | ICD-10-CM

## 2012-09-17 LAB — BASIC METABOLIC PANEL
CO2: 24 mEq/L (ref 19–32)
Calcium: 9.7 mg/dL (ref 8.4–10.5)
Creat: 1.4 mg/dL — ABNORMAL HIGH (ref 0.50–1.35)
Sodium: 137 mEq/L (ref 135–145)

## 2012-09-17 NOTE — Progress Notes (Signed)
BMP DONE TODAY Janeli Lewison 

## 2012-09-17 NOTE — Progress Notes (Signed)
Patient in today for BP check. BP checked manually using regular adult cuff.  BP LA 150/94 and RA 154/98.  BP rechecked with Dynamap using large adult cuff . RA 151/91 and LA 154/92 pulse 58. Tried to review meds but patient is not sure he is taking amlopidine.   He will call me this afternoon when he gets home and verify what he has been taking.

## 2012-09-18 ENCOUNTER — Telehealth: Payer: Self-pay | Admitting: Family Medicine

## 2012-09-18 DIAGNOSIS — I10 Essential (primary) hypertension: Secondary | ICD-10-CM

## 2012-09-18 MED ORDER — AMLODIPINE BESYLATE 10 MG PO TABS: 10.0000 mg | ORAL_TABLET | Freq: Every day | ORAL | Status: DC

## 2012-09-18 NOTE — Telephone Encounter (Signed)
Pt called back and is taking the following meds:  Allopurinol colcrys Folic acid Spironolactone Propranolol Iron Sodium bicarb nexium Calcium Vita b-1 gavilax

## 2012-09-18 NOTE — Telephone Encounter (Signed)
May increase allopurinol at next visit. Will defer refill until that time. Nursing to inform patient.

## 2012-09-18 NOTE — Telephone Encounter (Signed)
In July increased amlodipine to 10mg , unclear why patient is no longer on medication. Will refill at this time and ask nursing staff to inform patient. Will recheck blood pressure at next appointment which should be within the month.

## 2012-09-18 NOTE — Telephone Encounter (Signed)
Patient notified

## 2012-09-18 NOTE — Telephone Encounter (Signed)
Amlodipine is not on patient's list  but is on med list . Will ask Dr. Durene Cal to please advise.

## 2012-09-18 NOTE — Telephone Encounter (Signed)
Left voicemail that labwork looked ok but did not give specifics.   Kidney function and potassium was minimally changed on spironolactone but we will repeat testing at next visit.   Uric acid was within normal limits at 7.3 but not at goal of < 6 but will discuss changing to 100 mg of allopurinol at next visit and continuing colchicine during that time.   Patient is not immune to hepatitis B so will vaccinate for that and Hep A starting next visit.

## 2012-09-18 NOTE — Telephone Encounter (Signed)
Patient notified  Of message regarding amlopidine. He has appointment scheduled for 10/16 for follow up. Patient would like refill on  Colchicine. [facility] send [facility] pharmacy.

## 2012-10-10 ENCOUNTER — Ambulatory Visit (INDEPENDENT_AMBULATORY_CARE_PROVIDER_SITE_OTHER): Payer: Medicare Other | Admitting: Family Medicine

## 2012-10-10 ENCOUNTER — Encounter: Payer: Self-pay | Admitting: Family Medicine

## 2012-10-10 VITALS — BP 133/86 | HR 67 | Temp 98.1°F | Ht 71.0 in | Wt 194.0 lb

## 2012-10-10 DIAGNOSIS — Z23 Encounter for immunization: Secondary | ICD-10-CM

## 2012-10-10 DIAGNOSIS — M109 Gout, unspecified: Secondary | ICD-10-CM

## 2012-10-10 DIAGNOSIS — F1010 Alcohol abuse, uncomplicated: Secondary | ICD-10-CM

## 2012-10-10 DIAGNOSIS — I10 Essential (primary) hypertension: Secondary | ICD-10-CM

## 2012-10-10 DIAGNOSIS — K7030 Alcoholic cirrhosis of liver without ascites: Secondary | ICD-10-CM

## 2012-10-10 LAB — BASIC METABOLIC PANEL
CO2: 23 mEq/L (ref 19–32)
Chloride: 102 mEq/L (ref 96–112)
Creat: 1.29 mg/dL (ref 0.50–1.35)
Potassium: 4.4 mEq/L (ref 3.5–5.3)

## 2012-10-10 MED ORDER — ALLOPURINOL 100 MG PO TABS: 100.0000 mg | ORAL_TABLET | Freq: Every day | ORAL | Status: DC

## 2012-10-10 MED ORDER — COLCHICINE 0.6 MG PO TABS: 0.3000 mg | ORAL_TABLET | Freq: Every day | ORAL | Status: DC

## 2012-10-10 NOTE — Assessment & Plan Note (Signed)
Well controlled but uric acid still elevated at 7.3. Titrate up allopurinol to 100mg . Colcrys for 2 months. Follow up in 3 months with repeat uric acid.

## 2012-10-10 NOTE — Patient Instructions (Signed)
Dear William [name],   It was great to see you today. Thank you for coming to clinic. Please read below regarding the issues that we discussed.   1. I am glad you haven't had any gout flares. We are going to increase your allopurinol to 1 full pill and follow up in 3 months so we can check another blood level. Take your colchicine for 2 months.  2. Your blood pressure looks a lot better! I am glad the new medicine is working along with your bitter melon tea trick.  3. Great job not drinking any alcohol. This is incredibly important to protect your liver. Please consider AA meetings, I think they would help you in the long run. We are also going to start vaccinating you against Hepatitis A and B to protect your liver.   Please follow up in clinic in 3 months . Please call earlier if you have any questions or concerns.   Sincerely,  Dr. Tana Conch

## 2012-10-10 NOTE — Assessment & Plan Note (Signed)
Will discuss yearly u/s at next visit.  Vaccinate against Hep A and Hep B first shot today.

## 2012-10-10 NOTE — Assessment & Plan Note (Signed)
Encouraged AA. Praised cessation.

## 2012-10-10 NOTE — Progress Notes (Signed)
Subjective:   1. Gout-patient without any flares since starting allopurinol. Notified patient uric acid not at goal.   2.  Hypertension-started on spironalactone in addition to other agents at last visit (amlodipine 10mg , propranolol 40 mg BID) BP Readings from Last 3 Encounters:  10/10/12 133/86  09/17/12 150/94  09/11/12 158/94  Compliant with medications-yes without side effects, also trying bitter melon tea to help Denies any CP, HA, SOB, blurry vision, LE edema, transient weakness, orthopnea, PND.   3. Alcohol abuse-continues to abstain with not a single drink since last visit. Has not been to [employer]   ROS--See HPI  Past Medical History-smoking status noted: nonsmoker.  Reviewed problem list.  Medications- reviewed and updated Chief complaint-noted  Objective: BP 133/86  Pulse 67  Temp 98.1 F (36.7 C) (Oral)  Ht 5\' 11"  (1.803 m)  Wt 194 lb (87.998 kg)  BMI 27.06 kg/m2 Gen: NAD CV: RRR no mrg Lungs: CTAB Abd: soft, nontender, hepatomegaly to 4cm below costal margin MSK: moves all extremities Neuro: grossly normal, alert and oriented x 3   Assessment/Plan: See problem oriented charted

## 2012-10-10 NOTE — Assessment & Plan Note (Signed)
Improved. Well controlled after starting spironalactone. Check BMET for potassium today.

## 2012-10-24 ENCOUNTER — Other Ambulatory Visit: Payer: Self-pay | Admitting: Family Medicine

## 2012-10-24 DIAGNOSIS — K219 Gastro-esophageal reflux disease without esophagitis: Secondary | ICD-10-CM

## 2012-10-24 MED ORDER — ESOMEPRAZOLE MAGNESIUM 40 MG PO CPDR: 40.0000 mg | DELAYED_RELEASE_CAPSULE | Freq: Every day | ORAL | Status: DC

## 2012-10-24 NOTE — Telephone Encounter (Signed)
Refilled Nexium through health department fax.

## 2012-11-30 ENCOUNTER — Other Ambulatory Visit: Payer: Self-pay | Admitting: *Deleted

## 2012-12-01 MED ORDER — FOLIC ACID 1 MG PO TABS: 1.0000 mg | ORAL_TABLET | Freq: Every day | ORAL | Status: DC

## 2013-02-06 ENCOUNTER — Ambulatory Visit: Payer: Medicare Other | Admitting: Family Medicine

## 2013-02-18 ENCOUNTER — Encounter: Payer: Self-pay | Admitting: Family Medicine

## 2013-02-18 ENCOUNTER — Ambulatory Visit (INDEPENDENT_AMBULATORY_CARE_PROVIDER_SITE_OTHER): Payer: Medicare Other | Admitting: Family Medicine

## 2013-02-18 VITALS — BP 150/98 | HR 96 | Temp 98.3°F | Ht 71.0 in | Wt 190.0 lb

## 2013-02-18 DIAGNOSIS — Z23 Encounter for immunization: Secondary | ICD-10-CM

## 2013-02-18 DIAGNOSIS — F1010 Alcohol abuse, uncomplicated: Secondary | ICD-10-CM

## 2013-02-18 DIAGNOSIS — I10 Essential (primary) hypertension: Secondary | ICD-10-CM

## 2013-02-18 DIAGNOSIS — K7030 Alcoholic cirrhosis of liver without ascites: Secondary | ICD-10-CM

## 2013-02-18 MED ORDER — SPIRONOLACTONE 25 MG PO TABS: 25.0000 mg | ORAL_TABLET | Freq: Every day | ORAL | Status: DC

## 2013-02-18 MED ORDER — POLYETHYLENE GLYCOL 3350 17 G PO PACK: 17.0000 g | PACK | Freq: Two times a day (BID) | ORAL | Status: DC

## 2013-02-18 MED ORDER — PROPRANOLOL HCL 40 MG PO TABS: 40.0000 mg | ORAL_TABLET | Freq: Two times a day (BID) | ORAL | Status: DC

## 2013-02-18 NOTE — Patient Instructions (Addendum)
1.Your blood pressure is high today. I am going to increase your spironalactone to 1 full tab. Also, start taking your propranolol again. Let's see you back in 2-4 weeks to check your blood pressure and labs at that time (will also check your uric acid).  2. For your alcoholic liver disease-I beg you to continue to go to AA meetings and to abstain from future alcohol use as this can really hurt your liver. We gave you your 2nd hepatitis shot today. You will need hepatitis A and B 6 months from October when we gave you the first shot. I am going to check your liver for any signs of development of cancer given your cirrhosis with an ultrasound and we will plan to do this yearly.  3. Make sure to take your Miralax.   Thanks, Dr. Durene Cal

## 2013-02-19 ENCOUNTER — Encounter: Payer: Self-pay | Admitting: Family Medicine

## 2013-02-19 NOTE — Assessment & Plan Note (Signed)
Encouraged patient to take miralax given history of hepatic encephalopathy. Ordered hepatic ultrasound for yearly monitoring for Hepatocellular carcinoma. GIven 2nd hep B today. 6 months from original shots will need hep b and hep a vaccine.

## 2013-02-19 NOTE — Assessment & Plan Note (Signed)
Poorly controlled. Refilled propranolol (being out contributing) but also increased spironalactone to 25mg . Follow up 2-3 weeks and will repeat BMET at that time.

## 2013-02-19 NOTE — Assessment & Plan Note (Signed)
Encouraged patient to go to [employer] Reinforced he needs to drink NO alcohol given his liver disease and alcoholic cirrhosis.

## 2013-02-19 NOTE — Progress Notes (Signed)
Subjective:   1. Hypertension- BP Readings from Last 3 Encounters:  02/18/13 150/98  10/10/12 133/86  09/17/12 150/94  Home BP monitoring-no Compliant with medications-yes without side effects. Out of propranolol x 1 month.  Denies any CP, HA, SOB, blurry vision, LE edema, transient weakness, orthopnea, PND.   2. Alcohol abuse/alcoholic cirrhosis-had 1-2 drinks over new years but none since that time. Not going to AA meetings anymore. No confusion/RUQ pain/fever/chills. Not taking miralax.   ROS--See HPI  Past Medical History Patient Active Problem List  Diagnosis  . ANEMIA, HEMOLYTIC, NON-AUTOIMMUNE  . Alcoholic cirrhosis of liver  . Chronic kidney disease (CKD), stage III (moderate)  . Erectile dysfunction  . Hypertension  . MGUS (monoclonal gammopathy of unknown significance)  . Gout  . History of esophageal varices   Reviewed problem list.  Medications- reviewed and updated Chief complaint-noted  Objective: BP 150/98  Pulse 96  Temp(Src) 98.3 F (36.8 C) (Oral)  Ht 5\' 11"  (1.803 m)  Wt 190 lb (86.183 kg)  BMI 26.51 kg/m2 Gen: NAD, resting comfortably CV: RRR no murmurs rubs or gallops Lungs: CTAB no crackles, wheeze, rhonchi Abd: no obvious ascites, hepatomegaly noted 2 cm below costal margin.   Assessment/Plan:  Uric acid needed with next BMET to monitor gout control.

## 2013-02-22 ENCOUNTER — Ambulatory Visit (HOSPITAL_COMMUNITY): Payer: Medicare Other

## 2013-02-25 ENCOUNTER — Telehealth: Payer: Self-pay | Admitting: Family Medicine

## 2013-02-25 NOTE — Telephone Encounter (Signed)
Pt set up appt for 2 weeks and wants to know where to go for AA meetings - I told him to check the phn book about where they meet but he is also asking to speak to Dr Durene Cal

## 2013-02-26 NOTE — Telephone Encounter (Signed)
Spoke with patient. Does nto have a phone book available and limited internet access.   Patient would like to pick up a list of meetings for days that work for him (tuesday and Wednesday). I have placed the links below with information on these days. Will ask admin or blue team to place in box at front as patient will come by to pick them up tomorrow.     [email or web address]?day=Tuesday  [email or web address]?day=Wednesday

## 2013-02-26 NOTE — Telephone Encounter (Signed)
Placed up front. [name], William [name]

## 2013-02-27 ENCOUNTER — Ambulatory Visit (HOSPITAL_COMMUNITY)
Admission: RE | Admit: 2013-02-27 | Discharge: 2013-02-27 | Disposition: A | Discharge status: Home / Self Care | Payer: Medicare Other | Source: Ambulatory Visit | Attending: Family Medicine | Admitting: Family Medicine

## 2013-02-27 DIAGNOSIS — N183 Chronic kidney disease, stage 3 unspecified: Secondary | ICD-10-CM | POA: Insufficient documentation

## 2013-02-27 DIAGNOSIS — K7460 Unspecified cirrhosis of liver: Secondary | ICD-10-CM | POA: Insufficient documentation

## 2013-03-05 ENCOUNTER — Encounter: Payer: Self-pay | Admitting: Family Medicine

## 2013-03-07 ENCOUNTER — Ambulatory Visit (HOSPITAL_BASED_OUTPATIENT_CLINIC_OR_DEPARTMENT_OTHER): Payer: Medicare Other | Admitting: Oncology

## 2013-03-07 ENCOUNTER — Other Ambulatory Visit (HOSPITAL_BASED_OUTPATIENT_CLINIC_OR_DEPARTMENT_OTHER): Payer: Medicare Other | Admitting: Lab

## 2013-03-07 VITALS — BP 147/92 | HR 78 | Temp 97.4°F | Resp 20 | Wt 188.2 lb

## 2013-03-07 DIAGNOSIS — D729 Disorder of white blood cells, unspecified: Secondary | ICD-10-CM

## 2013-03-07 DIAGNOSIS — K769 Liver disease, unspecified: Secondary | ICD-10-CM

## 2013-03-07 LAB — COMPREHENSIVE METABOLIC PANEL (CC13)
ALT: 35 U/L (ref 0–55)
AST: 79 U/L — ABNORMAL HIGH (ref 5–34)
Alkaline Phosphatase: 399 U/L — ABNORMAL HIGH (ref 40–150)
BUN: 11.2 mg/dL (ref 7.0–26.0)
Calcium: 9.2 mg/dL (ref 8.4–10.4)
Chloride: 105 mEq/L (ref 98–107)
Creatinine: 1.2 mg/dL (ref 0.7–1.3)
Total Bilirubin: 1.22 mg/dL — ABNORMAL HIGH (ref 0.20–1.20)

## 2013-03-07 LAB — CBC WITH DIFFERENTIAL/PLATELET
BASO%: 0.8 % (ref 0.0–2.0)
Basophils Absolute: 0 10*3/uL (ref 0.0–0.1)
EOS%: 2.1 % (ref 0.0–7.0)
HCT: 39.4 % (ref 38.4–49.9)
HGB: 13.3 g/dL (ref 13.0–17.1)
LYMPH%: 22.8 % (ref 14.0–49.0)
MCH: 34.7 pg — ABNORMAL HIGH (ref 27.2–33.4)
MCHC: 33.9 g/dL (ref 32.0–36.0)
MCV: 102.5 fL — ABNORMAL HIGH (ref 79.3–98.0)
MONO%: 8.4 % (ref 0.0–14.0)
NEUT%: 65.9 % (ref 39.0–75.0)
Platelets: 115 10*3/uL — ABNORMAL LOW (ref 140–400)

## 2013-03-07 NOTE — Progress Notes (Signed)
Hematology and Oncology Follow Up Visit  William [name] [id number] 07/04/1961 52 y.o. 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 This is likely reactive due to liver disease. Work up did not reveal a plasma cell disorder in 01/2012.  Current therapy: Observation and surveillance  Interim History: William [name] presents today for a followup visit. He is a gentleman I saw for the first time in February of 2013. He is a gentleman with history of liver disease and an evaluation for a possible plasma cell disorder. His workup did not reveal any clear-cut end organ damage to suggest a multiple myeloma. His urine electrophoresis did show slight elevation of kappa to lambda ratio at 11.64. His serum electrophoresis did not show a monoclonal protein. Clinically William [name] does not report any new symptoms. He continued to have back pain but no pathological fractures no infectious process or hospitalizations. His skeletal survey was also negative on 01/2011.  No bleeding complications noted since the last visit. No new issue reported at this time.    Medications: I have reviewed the patient's current medications. Current outpatient prescriptions:allopurinol (ZYLOPRIM) 100 MG tablet, Take 1 tablet (100 mg total) by mouth daily., Disp: 30 tablet, Rfl: 11;  amLODipine (NORVASC) 10 MG tablet, Take 1 tablet (10 mg total) by mouth daily., Disp: 90 tablet, Rfl: 3;  calcium carbonate (OS-CAL) 600 MG TABS, Take 600 mg by mouth daily.  , Disp: , Rfl: ;  colchicine 0.6 MG tablet, Take 0.5 tablets (0.3 mg total) by mouth daily., Disp: 60 tablet, Rfl: 0 esomeprazole (NEXIUM) 40 MG capsule, Take 1 capsule (40 mg total) by mouth daily before breakfast., Disp: 90 capsule, Rfl: 5;  ferrous sulfate 325 (65 FE) MG EC tablet, Take 325 mg by mouth daily  with breakfast.  , Disp: , Rfl: ;  folic acid (FOLVITE) 1 MG tablet, Take 1 tablet (1 mg total) by mouth daily., Disp: 90 tablet, Rfl: 1 gabapentin (NEURONTIN) 100 MG capsule, Take 100 mg in morning 100 mg in the afternoon and 300 mg at night, Disp: 450 capsule, Rfl: 2;  propranolol (INDERAL) 40 MG tablet, Take 1 tablet (40 mg total) by mouth 2 (two) times daily., Disp: 60 tablet, Rfl: 11;  sodium bicarbonate 650 MG tablet, Take 1 tablet (650 mg total) by mouth 2 (two) times daily., Disp: 60 tablet, Rfl: 6 spironolactone (ALDACTONE) 25 MG tablet, Take 1 tablet (25 mg total) by mouth daily., Disp: 30 tablet, Rfl: 5;  HYDROcodone-acetaminophen (VICODIN) 5-500 MG per tablet, Take 0.5 tablets by mouth every 8 (eight) hours as needed for pain., Disp: 45 tablet, Rfl: 0;  polyethylene glycol (MIRALAX / GLYCOLAX) packet, Take 17 g by mouth 2 (two) times daily. Hold for a few days if you have more than 3 bowel movements per day., Disp: 56 each, Rfl: 0  Allergies: No Known Allergies  Past Medical History, Surgical history, Social history, and Family History were reviewed and updated.  Review of Systems: Constitutional:  Negative for fever, chills, night sweats, anorexia, weight loss, pain. Cardiovascular: no chest pain or dyspnea on exertion Respiratory: no cough, shortness of breath, or wheezing Neurological: no TIA or stroke symptoms Dermatological: negative ENT: negative Skin: Negative. Gastrointestinal: no abdominal pain, change in bowel habits, or black or bloody stools Genito-Urinary: negative Hematological and Lymphatic: negative Breast: negative Musculoskeletal: negative  Remaining ROS negative. Physical Exam: Blood pressure 147/92, pulse 78, temperature 97.4 F (36.3 C), temperature source Oral, resp. rate 20, weight 188 lb 4 oz (85.39 kg). ECOG: 1 General appearance: alert Head: Normocephalic, without obvious abnormality, atraumatic Neck: no adenopathy, no carotid bruit, no JVD, supple,  symmetrical, trachea midline and thyroid not enlarged, symmetric, no tenderness/mass/nodules Lymph nodes: Cervical, supraclavicular, and axillary nodes normal. Heart:regular rate and rhythm, S1, S2 normal, no murmur, click, rub or gallop Lung:chest clear, no wheezing, rales, normal symmetric air entry Abdomin: soft, non-tender, without masses or organomegaly EXT:no erythema, induration, or nodules   Lab Results: Lab Results  Component Value Date   WBC 5.0 03/07/2013   HGB 13.3 03/07/2013   HCT 39.4 03/07/2013   MCV 102.5* 03/07/2013   PLT 115* 03/07/2013     Chemistry      Component Value Date/Time   NA 136 10/10/2012 1211   NA 139 09/07/2012 1125   K 4.4 10/10/2012 1211   K 3.8 09/07/2012 1125   CL 102 10/10/2012 1211   CL 106 09/07/2012 1125   CO2 23 10/10/2012 1211   CO2 21* 09/07/2012 1125   BUN 18 10/10/2012 1211   BUN 12.0 09/07/2012 1125   CREATININE 1.29 10/10/2012 1211   CREATININE 1.4* 09/07/2012 1125   CREATININE 1.57* 02/07/2012 1301      Component Value Date/Time   CALCIUM 9.6 10/10/2012 1211   CALCIUM 9.6 09/07/2012 1125   ALKPHOS 310* 09/07/2012 1125   ALKPHOS 305* 08/06/2012 1142   AST 41* 09/07/2012 1125   AST 49* 08/06/2012 1142   ALT 22 09/07/2012 1125   ALT 24 08/06/2012 1142   BILITOT 1.30* 09/07/2012 1125   BILITOT 1.3* 08/06/2012 1142       Impression and Plan: 52 year old gentleman with: 1.  Polyclonal elevation in his gamma globulins in the serum; however, he does have a monoclonal free kappa light chain in the urine. 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 reactive due to his liver disease. There is no evidence of multiple myeloma at this point. The plan is to continue follow him and repeat the labs in 12  months. If there are changes to suggest end organ damage, then I will restage him at that time.   2.  Liver disease: he is following with PCP regarding that.   3. Thrombocytopenia:This is due to his liver disease. Improved at  this point. No bleeding noted.      [facility], MD 3/13/201412:08 PM

## 2013-03-08 ENCOUNTER — Telehealth: Payer: Self-pay | Admitting: Oncology

## 2013-03-08 NOTE — Telephone Encounter (Signed)
lvm for pt regarding to 3.2015 appt....mailed pt appt schedule for March

## 2013-03-12 ENCOUNTER — Ambulatory Visit: Payer: Medicare Other | Admitting: Family Medicine

## 2013-03-12 LAB — SPEP & IFE WITH QIG
Alpha-1-Globulin: 5.2 % — ABNORMAL HIGH (ref 2.9–4.9)
Gamma Globulin: 21.9 % — ABNORMAL HIGH (ref 11.1–18.8)
IgM, Serum: 148 mg/dL (ref 41–251)
Total Protein, Serum Electrophoresis: 8 g/dL (ref 6.0–8.3)

## 2013-03-15 ENCOUNTER — Encounter: Payer: Self-pay | Admitting: Family Medicine

## 2013-03-22 ENCOUNTER — Telehealth: Payer: Self-pay | Admitting: Dietician

## 2013-03-22 ENCOUNTER — Encounter: Payer: Self-pay | Admitting: Family Medicine

## 2013-03-22 ENCOUNTER — Ambulatory Visit (INDEPENDENT_AMBULATORY_CARE_PROVIDER_SITE_OTHER): Payer: Medicare Other | Admitting: Family Medicine

## 2013-03-22 VITALS — BP 119/78 | HR 74 | Temp 98.6°F | Ht 71.0 in | Wt 189.0 lb

## 2013-03-22 DIAGNOSIS — D472 Monoclonal gammopathy: Secondary | ICD-10-CM

## 2013-03-22 DIAGNOSIS — I10 Essential (primary) hypertension: Secondary | ICD-10-CM

## 2013-03-22 DIAGNOSIS — F1010 Alcohol abuse, uncomplicated: Secondary | ICD-10-CM

## 2013-03-22 DIAGNOSIS — M109 Gout, unspecified: Secondary | ICD-10-CM

## 2013-03-22 DIAGNOSIS — M549 Dorsalgia, unspecified: Secondary | ICD-10-CM

## 2013-03-22 LAB — COMPREHENSIVE METABOLIC PANEL
BUN: 16 mg/dL (ref 6–23)
CO2: 23 mEq/L (ref 19–32)
Calcium: 9.3 mg/dL (ref 8.4–10.5)
Chloride: 101 mEq/L (ref 96–112)
Creat: 1.36 mg/dL — ABNORMAL HIGH (ref 0.50–1.35)
Glucose, Bld: 105 mg/dL — ABNORMAL HIGH (ref 70–99)

## 2013-03-22 MED ORDER — AMLODIPINE BESYLATE 5 MG PO TABS: 5.0000 mg | ORAL_TABLET | Freq: Every day | ORAL | Status: DC

## 2013-03-22 MED ORDER — HYDROCODONE-ACETAMINOPHEN 5-325 MG PO TABS: 0.5000 | ORAL_TABLET | Freq: Every day | ORAL | Status: DC | PRN

## 2013-03-22 MED ORDER — COLCHICINE 0.6 MG PO TABS: 0.3000 mg | ORAL_TABLET | Freq: Every day | ORAL | Status: DC

## 2013-03-22 NOTE — Progress Notes (Signed)
Subjective:   1. Hypertension- BP Readings from Last 3 Encounters:  03/22/13 119/78  03/07/13 147/92  02/18/13 150/98  Home BP monitoring-no Compliant with medications-yes without side effects. Patient very excited about normal BP today.  Denies any CP, HA, SOB, blurry vision, LE edema, transient weakness, orthopnea, PND.   2. Alcohol abuse/alcoholic cirrhosis-had 1-2 drinks over new years but none since that time. Not going to AA meetings yet but doing well.  No confusion/RUQ pain/fever/chills. Taking miralax.   3. Gout -no recent flare but taking colchicine daily.  4. Back Pain- starts after about 2 hours of standing at work. Starts at top of back and radiates down to bottom of back stays on sides of spinal column. Takes 1 or less on most days when working. If not working, doesn't take. No heavy operating and not driving after taking it.   5. MGUS-Patient has regular follow up with oncology and they are now seeing him on yearly basis-not thought related to MGUS. Possibly reactive secondary to liver. No signs MM. F/u and repeat labs 12 months.   ROS--See HPI  Past Medical History Patient Active Problem List  Diagnosis  . Alcoholic cirrhosis of liver  . Chronic kidney disease (CKD), stage III (moderate)  . Erectile dysfunction  . Hypertension  . MGUS (monoclonal gammopathy of unknown significance)  . Gout  . History of esophageal varices   Reviewed problem list.  Medications- reviewed and updated Chief complaint-noted  Objective: BP 119/78  Pulse 74  Temp(Src) 98.6 F (37 C) (Oral)  Ht 5\' 11"  (1.803 m)  Wt 189 lb (85.73 kg)  BMI 26.37 kg/m2 Gen: NAD, resting comfortably CV: RRR no murmurs rubs or gallops Lungs: CTAB no crackles, wheeze, rhonchi Abd: no obvious ascites, hepatomegaly noted 2 cm below costal margin.  Back-full rom and neck and lumbar spine. No tenderness to palpation of back. Negative straight leg raise.   Assessment/Plan:

## 2013-03-22 NOTE — Patient Instructions (Signed)
1. Blood pressure looks great! Keep same routine.  2. i am glad you are still thinking about AA meetings, I think they would be great for you.  3. For back pain, we gave you a 6 month supply of medication.  4. For your gout, I will check a level today. Only take colchicine as needed for flares now. I am going to see if we need to increase your allopurinol.   I look forward to seeing records from your cancer doctor.   Let me see you at least every 6 months please.  Dr. Durene Cal

## 2013-03-24 NOTE — Assessment & Plan Note (Signed)
Well controlled today. Continue amlodipine, propranolol and spironalactone.

## 2013-03-24 NOTE — Assessment & Plan Note (Signed)
Followed by oncology. Labs stable. F/u 1 year.

## 2013-03-24 NOTE — Assessment & Plan Note (Addendum)
Asked patient to stop taking chronic colchicine and only use for flares. Uric acid controlled <6. Continue allopurinol.

## 2013-03-24 NOTE — Assessment & Plan Note (Signed)
Congratulated on no alcohol since new years. Continued to encourage AA for sustained abstinence.

## 2013-03-24 NOTE — Assessment & Plan Note (Signed)
45 percocet for 6 month supply given today. Helps improve patient function and allows him to work. Desire to not increase this medication. Patient understands.

## 2013-04-04 ENCOUNTER — Telehealth: Payer: Self-pay | Admitting: Family Medicine

## 2013-04-04 NOTE — Telephone Encounter (Signed)
Patient is calling for a Rx for his Vicodin.  He would like faxed to [facility] and he would like a call when this has been done.  He would really appreciate if this could be done today.

## 2013-04-04 NOTE — Telephone Encounter (Signed)
Contacted [facility] [address].  Neither one of those have filled an Rx for Vicodin on patient. Will forward to MD. Milas Gain, Maryjo Rochester

## 2013-04-05 NOTE — Telephone Encounter (Signed)
To MD. [name], Jessica Dawn  

## 2013-04-05 NOTE — Telephone Encounter (Signed)
Vicodin was printed and handed to patient at last visit. This will not be faxed. He would need office visit for refill of narcotics and I would not advise refill from another provider as given to patient at most recent visit. Will ask RN to inform patient.

## 2013-04-05 NOTE — Telephone Encounter (Signed)
Pt called back and states that he cant find the Rxs.  He said he is afraid that he "threw them away when he was cleaning off his table"  Advised that I would send a message to MD, but I did not think that he would refill it and may ask him to come on for an appt. [name], Maryjo Rochester

## 2013-04-05 NOTE — Telephone Encounter (Signed)
Spoke with patient.  Advised that MD printed those scripts and gave them to him at his visit on 3/28.  Pt states that he has the Rxs but they do not say vicodin.  Asked him to read the rxs to me and the ones that he read were from the visit on 2/24.  He will check his house and vehicle and give [place] a call back. [name], Maryjo Rochester

## 2013-04-06 NOTE — Telephone Encounter (Signed)
Agreed. Patient will need an appointment. This is the first time I have known patient to lose narcotic script but this is very serious and needs to be discussed in person.   In addition, need to discuss other medications that patient has rx for but does not seem to have filled. Ask patient to bring all medications please.

## 2013-04-08 ENCOUNTER — Telehealth: Payer: Self-pay | Admitting: Family Medicine

## 2013-04-08 NOTE — Telephone Encounter (Signed)
Pt states he found his meds, advised pt when he does F/U to please bring all meds for clarification, pt voiced understanding.

## 2013-04-08 NOTE — Telephone Encounter (Signed)
Pt states that pharmacy would not refill his medicine..the patient states that the pharmacy says that Dr. Durene Cal couldn't refill the prescription.

## 2013-04-08 NOTE — Telephone Encounter (Signed)
Needed clarification on sig about September. Zineb Glade, Maryjo Rochester

## 2013-05-02 ENCOUNTER — Other Ambulatory Visit: Payer: Self-pay | Admitting: *Deleted

## 2013-05-02 DIAGNOSIS — I10 Essential (primary) hypertension: Secondary | ICD-10-CM

## 2013-05-02 MED ORDER — AMLODIPINE BESYLATE 5 MG PO TABS: 5.0000 mg | ORAL_TABLET | Freq: Every day | ORAL | Status: DC

## 2013-07-08 ENCOUNTER — Other Ambulatory Visit: Payer: Self-pay | Admitting: *Deleted

## 2013-07-08 MED ORDER — FOLIC ACID 1 MG PO TABS: 1.0000 mg | ORAL_TABLET | Freq: Every day | ORAL | Status: DC

## 2013-08-21 ENCOUNTER — Encounter: Payer: Self-pay | Admitting: Family Medicine

## 2013-08-21 ENCOUNTER — Ambulatory Visit (INDEPENDENT_AMBULATORY_CARE_PROVIDER_SITE_OTHER): Payer: Medicare Other | Admitting: Family Medicine

## 2013-08-21 VITALS — BP 136/88 | HR 82 | Temp 98.3°F | Ht 71.0 in | Wt 183.0 lb

## 2013-08-21 DIAGNOSIS — K0520 Aggressive periodontitis, unspecified: Secondary | ICD-10-CM | POA: Insufficient documentation

## 2013-08-21 DIAGNOSIS — I10 Essential (primary) hypertension: Secondary | ICD-10-CM

## 2013-08-21 DIAGNOSIS — F1010 Alcohol abuse, uncomplicated: Secondary | ICD-10-CM

## 2013-08-21 MED ORDER — PENICILLIN V POTASSIUM 500 MG PO TABS: 500.0000 mg | ORAL_TABLET | Freq: Four times a day (QID) | ORAL | Status: DC

## 2013-08-21 NOTE — Assessment & Plan Note (Signed)
Cannot r/o abscess but unlikely. Acutely tender w/ soft tissue swelling Pen VK x 7 days Start using H202/water 1:1 mouthwash formulation.  F/u PRN

## 2013-08-21 NOTE — Assessment & Plan Note (Signed)
Well-controlled.  Continue current regimen. 

## 2013-08-21 NOTE — Progress Notes (Signed)
William [name] is a 52 y.o. male who presents to [facility] today for SD appt for dental pain  Dental pain: started 5 days ago. wrinsing w/ peroxide w/o benefit. Denies purulent/bloody discharge. Facial swelling. Painful to chew on side of affected tooth. L bottom set of teeth. Denies fevers, n/v/d/c, CP, SOb. No h/o cavities but does not go to dentist. Very little PO for last several days.   HTN: taking BP meds as prescribed. ROS as above.   ETOH: still drinking 1 pint weekly. Counseled to quit. Denies any abdominal distension or easy bruising.   The following portions of the patient's history were reviewed and updated as appropriate: allergies, current medications, past medical history, family and social history, and problem list.  Patient is a nonsmoker  Past Medical History  Diagnosis Date  . COMPRESSION FRACTURE, LUMBAR VERTEBRAE 07/29/2010    Qualifier: Diagnosis of  By: Cedric Fishman    . Hypertension   . Chronic kidney disease   . Cirrhosis of liver   . Anemia     ROS as above otherwise neg.    Medications reviewed. Current Outpatient Prescriptions  Medication Sig Dispense Refill  . allopurinol (ZYLOPRIM) 100 MG tablet Take 1 tablet (100 mg total) by mouth daily.  30 tablet  11  . amLODipine (NORVASC) 5 MG tablet Take 1 tablet (5 mg total) by mouth daily.  90 tablet  3  . colchicine 0.6 MG tablet Take 0.5 tablets (0.3 mg total) by mouth daily.  60 tablet  2  . esomeprazole (NEXIUM) 40 MG capsule Take 1 capsule (40 mg total) by mouth daily before breakfast.  90 capsule  5  . ferrous sulfate 325 (65 FE) MG EC tablet Take 325 mg by mouth daily with breakfast.        . folic acid (FOLVITE) 1 MG tablet Take 1 tablet (1 mg total) by mouth daily.  90 tablet  1  . gabapentin (NEURONTIN) 100 MG capsule Take 100 mg in morning 100 mg in the afternoon and 300 mg at night  450 capsule  2  . HYDROcodone-acetaminophen (NORCO) 5-325 MG per tablet Take 0.5-1 tablets by mouth daily as needed for  pain. Next refill allowed September 2014.  45 tablet  0  . polyethylene glycol (MIRALAX / GLYCOLAX) packet Take 17 g by mouth 2 (two) times daily. Hold for a few days if you have more than 3 bowel movements per day.  56 each  0  . propranolol (INDERAL) 40 MG tablet Take 1 tablet (40 mg total) by mouth 2 (two) times daily.  60 tablet  11  . sodium bicarbonate 650 MG tablet Take 1 tablet (650 mg total) by mouth 2 (two) times daily.  60 tablet  6  . spironolactone (ALDACTONE) 25 MG tablet Take 1 tablet (25 mg total) by mouth daily.  30 tablet  5   No current facility-administered medications for this visit.    Exam:  BP 136/88  Pulse 82  Temp(Src) 98.3 F (36.8 C) (Oral)  Ht 5\' 11"  (1.803 m)  Wt 183 lb (83.008 kg)  BMI 25.53 kg/m2 Gen: Well NAD HEENT: EOMI,  MMM, L mandibular soft tissue peridontal swelling and erythema, ttp, no obvious abscess though tissue somewhat friable.  No results found for this or any previous visit (from the past 72 hour(s)).

## 2013-08-21 NOTE — Assessment & Plan Note (Signed)
Has cut back considerably per pt. Counseled to quit entirely No signs of biliary congestion or coagulopathy.

## 2013-08-21 NOTE — Patient Instructions (Addendum)
You have a dental/periodontal infection Please start taking the Penicillin VK for this Please start using hydrogen peroxide as a mouthwash. Mix 1 part hydrogen peroxide with 1 part water. Please come back if this does not get better or worsens.

## 2013-08-30 ENCOUNTER — Other Ambulatory Visit: Payer: Self-pay | Admitting: Family Medicine

## 2013-08-30 DIAGNOSIS — I10 Essential (primary) hypertension: Secondary | ICD-10-CM

## 2013-08-30 MED ORDER — PROPRANOLOL HCL 40 MG PO TABS: 40.0000 mg | ORAL_TABLET | Freq: Two times a day (BID) | ORAL | Status: DC

## 2013-09-30 ENCOUNTER — Telehealth: Payer: Self-pay | Admitting: Family Medicine

## 2013-09-30 NOTE — Telephone Encounter (Signed)
Refill request for Vicodin. Please call patient when ready.

## 2013-09-30 NOTE — Telephone Encounter (Signed)
Narcotics are not refilled by phone. Please inform patient that he would need to schedule an appointment and in addition that does not guarantee that we will refill medicine but that we will reassess at that time.

## 2013-10-02 ENCOUNTER — Ambulatory Visit: Payer: Medicare Other | Admitting: Family Medicine

## 2013-10-14 ENCOUNTER — Encounter: Payer: Self-pay | Admitting: Family Medicine

## 2013-10-14 ENCOUNTER — Ambulatory Visit (INDEPENDENT_AMBULATORY_CARE_PROVIDER_SITE_OTHER): Payer: Medicare Other | Admitting: Family Medicine

## 2013-10-14 VITALS — BP 133/89 | HR 71 | Temp 98.3°F | Ht 71.0 in | Wt 181.0 lb

## 2013-10-14 DIAGNOSIS — M549 Dorsalgia, unspecified: Secondary | ICD-10-CM

## 2013-10-14 DIAGNOSIS — Z23 Encounter for immunization: Secondary | ICD-10-CM

## 2013-10-14 DIAGNOSIS — Z8719 Personal history of other diseases of the digestive system: Secondary | ICD-10-CM

## 2013-10-14 DIAGNOSIS — I10 Essential (primary) hypertension: Secondary | ICD-10-CM

## 2013-10-14 DIAGNOSIS — K7030 Alcoholic cirrhosis of liver without ascites: Secondary | ICD-10-CM

## 2013-10-14 DIAGNOSIS — M25569 Pain in unspecified knee: Secondary | ICD-10-CM

## 2013-10-14 DIAGNOSIS — M109 Gout, unspecified: Secondary | ICD-10-CM

## 2013-10-14 DIAGNOSIS — F1010 Alcohol abuse, uncomplicated: Secondary | ICD-10-CM

## 2013-10-14 MED ORDER — POLYETHYLENE GLYCOL 3350 17 G PO PACK: 17.0000 g | PACK | Freq: Two times a day (BID) | ORAL | Status: DC

## 2013-10-14 MED ORDER — ALLOPURINOL 100 MG PO TABS: 100.0000 mg | ORAL_TABLET | Freq: Every day | ORAL | Status: DC

## 2013-10-14 MED ORDER — PROPRANOLOL HCL 40 MG PO TABS: 40.0000 mg | ORAL_TABLET | Freq: Two times a day (BID) | ORAL | Status: DC

## 2013-10-14 MED ORDER — HYDROCODONE-ACETAMINOPHEN 5-325 MG PO TABS: 0.5000 | ORAL_TABLET | Freq: Every day | ORAL | Status: DC | PRN

## 2013-10-14 NOTE — Patient Instructions (Addendum)
I am glad the medicine is helping with your knee and back pain. Ultimately, weight loss is probably the most helpful thing as well as core strengthening.  You set a goal to go to the [facility] (try to aim for at least 3x a week and use the stationary bike). We are also referring you to physical therapy.   I think going back to AA would be helpful. Your drinking is harming your liver which has been previously severely damaged. Please avoid drinking and get the help you need.   See me in 3 months and we will recheck your liver function at that time,  Dr. Durene Cal  Health Maintenance Due  Topic Date Due  . Influenza Vaccine -today  07/26/2013

## 2013-10-14 NOTE — Assessment & Plan Note (Signed)
Stressed need to take propranolol. Refilled as patient has been out.

## 2013-10-14 NOTE — Assessment & Plan Note (Signed)
Advised complete cessation from alcohol once again. Patient seems to know not to drink high levels of alcohol but still drinks and this is a risk for him and his liver. Counseled extensively.

## 2013-10-14 NOTE — Assessment & Plan Note (Addendum)
Advised long term solution is exercise, core strengthening and weight loss. Patient has [employer] and is considering using stationary bike. Referred to PT for core strengthening. Gave #20 vicodin for 3 months to be used sparingly.   Plan on pain contract discussion and possible wean at next visit depending on progress.

## 2013-10-14 NOTE — Progress Notes (Signed)
[facility] Tana Conch, MD Phone: [phone]  Subjective:  Chief complaint-noted  Back and knee pain Patient with chronic back pain likely due to severe degenerative disc disease at L4-L5. He can tolerate this most days with low level 3/10 pain but when on his feet at work for prolonged periods up to 7/10. He has had to cut back work hours from 15-20 to 10-15 per week due to pain and being out of vicodin. Patient also with moderate pain in knees worse with standing better with rest. Chonic issue for which he occasionally will use vicodin with prolonged standing as well.  ROS-no saddle anesthesia. No lower extremity weakness. No fecal or urinary incontinence. No locking, popping or giving way of knee.   Alcohol Abuse. Alcoholic cirrhosis of liver.  At last visit reported 1 pint per week. Today, reports drinking 1 beer per week occasionally.  ROS-no RUQ fullness, denies jaundice  Past Medical History Patient Active Problem List   Diagnosis Date Noted  . MGUS (monoclonal gammopathy of unknown significance) 06/15/2012    Priority: High  . Alcohol abuse 01/16/2012    Priority: High  . Chronic kidney disease (CKD), stage III (moderate) 12/13/2011    Priority: High  . Alcoholic cirrhosis of liver 02/23/2010    Priority: High  . Hypertension 02/28/2012    Priority: Medium  . Back pain 08/11/2011    Priority: Medium  . Gout 06/27/2012    Priority: Low  . Periodontitis, aggressive 08/21/2013  . History of esophageal varices 08/20/2012  . Shoulder pain, right 06/27/2012  . Cervical pain (neck) 01/16/2012  . Erectile dysfunction 12/13/2011  . Shoulder pain, left 02/07/2011  . Knee pain 02/07/2011  . ANEMIA, HEMOLYTIC, NON-AUTOIMMUNE 07/29/2010   Reviewed problem list.  Medications- reviewed and updated Current Outpatient Prescriptions on File Prior to Visit  Medication Sig Dispense Refill  . amLODipine (NORVASC) 5 MG tablet Take 1 tablet (5 mg total) by  mouth daily.  90 tablet  3  . esomeprazole (NEXIUM) 40 MG capsule Take 1 capsule (40 mg total) by mouth daily before breakfast.  90 capsule  5  . ferrous sulfate 325 (65 FE) MG EC tablet Take 325 mg by mouth daily with breakfast.        . folic acid (FOLVITE) 1 MG tablet Take 1 tablet (1 mg total) by mouth daily.  90 tablet  1  . gabapentin (NEURONTIN) 100 MG capsule Take 100 mg in morning 100 mg in the afternoon and 300 mg at night  450 capsule  2  . sodium bicarbonate 650 MG tablet Take 1 tablet (650 mg total) by mouth 2 (two) times daily.  60 tablet  6  . spironolactone (ALDACTONE) 25 MG tablet Take 1 tablet (25 mg total) by mouth daily.  30 tablet  5  . colchicine 0.6 MG tablet Take 0.5 tablets (0.3 mg total) by mouth daily.  60 tablet  2   No current facility-administered medications on file prior to visit.    Objective: BP 133/89  Pulse 71  Temp(Src) 98.3 F (36.8 C) (Oral)  Ht 5\' 11"  (1.803 m)  Wt 181 lb (82.101 kg)  BMI 25.26 kg/m2 Gen: NAD, resting comfortably in chair, moves slowly when getting onto table (reports due to back pain) CV: RRR no murmurs rubs or gallops Lungs: CTAB no crackles, wheeze, rhonchi Abd: soft/nondistended. Some firmness likely 4 fingerbreadth's below costal margin likely due to hepatomegaly  Ext: no edema, 2+  Pulses Radial and posterior  tib.   Back - Normal skin, Spine with normal alignment and no deformity.  No tenderness to vertebral process palpation.  Paraspinous muscles are  tender but without spasm.   No leg weakness.  Knee: no effusion bilaterally. Mild crepitus.   Assessment/Plan:

## 2013-10-14 NOTE — Assessment & Plan Note (Signed)
Reinforced need for miralax (Cant afford lactulose) to avoid hepatic encephalopathy. Advised alcohol cessation. Will recheck liver enzymes and pt/inr in 3 months.

## 2013-10-14 NOTE — Assessment & Plan Note (Signed)
Low level narcotics for back pain also helping for presumed osteoarthritis. If back pain improves and patient willing, could trial injections at future visit.

## 2014-01-06 ENCOUNTER — Other Ambulatory Visit: Payer: Self-pay | Admitting: Family Medicine

## 2014-01-06 DIAGNOSIS — I10 Essential (primary) hypertension: Secondary | ICD-10-CM

## 2014-01-06 MED ORDER — SPIRONOLACTONE 25 MG PO TABS: 25.0000 mg | ORAL_TABLET | Freq: Every day | ORAL | Status: DC

## 2014-01-30 ENCOUNTER — Ambulatory Visit: Payer: Medicare Other | Admitting: Family Medicine

## 2014-02-05 ENCOUNTER — Ambulatory Visit: Payer: Medicare Other | Admitting: Family Medicine

## 2014-02-12 ENCOUNTER — Ambulatory Visit (INDEPENDENT_AMBULATORY_CARE_PROVIDER_SITE_OTHER): Payer: Commercial Managed Care - HMO | Admitting: Family Medicine

## 2014-02-12 ENCOUNTER — Encounter: Payer: Self-pay | Admitting: Family Medicine

## 2014-02-12 VITALS — BP 130/70 | HR 92 | Temp 98.5°F | Ht 71.0 in | Wt 174.0 lb

## 2014-02-12 DIAGNOSIS — M25569 Pain in unspecified knee: Secondary | ICD-10-CM

## 2014-02-12 DIAGNOSIS — F1010 Alcohol abuse, uncomplicated: Secondary | ICD-10-CM

## 2014-02-12 DIAGNOSIS — M549 Dorsalgia, unspecified: Secondary | ICD-10-CM | POA: Diagnosis not present

## 2014-02-12 NOTE — Patient Instructions (Signed)
I think the most important thing from today is that we get you into a detox program for alcohol.  I want to see you back as soon as you get out of the detox program or in 1-2 weeks.   For your knee pain: I do no think it is safe to prescribe the vicodin while you are drinking so heavily.  We can reconsider this when you get out of detox but our overall goal is to wean you off of these medicines.  We can also consider a knee injection. We likely need to start with knee x-rays which you can go get today if you would like.   Thanks, Dr. Yong Channel

## 2014-02-14 NOTE — Progress Notes (Signed)
William Reddish, MD Phone: [phone]  Subjective:  Chief complaint-noted  Knee Osteoarthritis/low back pain Patient presents today complaining of bilateral knee pain. Rates pain as moderate aching worse with walking. He is requesting a refill of vicodin. Previously I had prescribed this to be used sparingly for low back pain. Was given #20 on 20/20/14 and still has 5 left. States back overall is doing better. He did not go to physical therapy (states missed number). Has [employer] but not working out. Has never had injections of knee and in current records no knee x-rays.  ROS- no fecal or urinary incontinence, saddle anesthesia or leg weakness. No locking/popping/giving way of knee.   Alcoholism with history of alcoholic cirrhosis Patient presented for above but I typically inquire about this as patient has never sought out AA, does not have regular support and previously was drinking even if just 1 beer a week. At present, back up to a pint a day. Patient says started back after New Years. Stopped taking miralax as well.  ROS-no right upper quadrant pain or confusion, denies pruritis  Past Medical History- MGUS, alcohol abuse, CKD stage III, alcoholic cirrhosis of liver, history of esophageal varices, hypertension, gout  Medications- reviewed and updated Current Outpatient Prescriptions  Medication Sig Dispense Refill  . allopurinol (ZYLOPRIM) 100 MG tablet Take 1 tablet (100 mg total) by mouth daily.  30 tablet  11  . ferrous sulfate 325 (65 FE) MG EC tablet Take 325 mg by mouth daily with breakfast.        . folic acid (FOLVITE) 1 MG tablet Take 1 tablet (1 mg total) by mouth daily.  90 tablet  1  . HYDROcodone-acetaminophen (NORCO) 5-325 MG per tablet Take 0.5-1 tablets by mouth daily as needed for pain. Next refill allowed January 2014.  20 tablet  0  . propranolol (INDERAL) 40 MG tablet Take 1 tablet (40 mg total) by mouth 2 (two) times daily. Sent to health department  60  tablet  11  . spironolactone (ALDACTONE) 25 MG tablet Take 1 tablet (25 mg total) by mouth daily.  30 tablet  5  . amLODipine (NORVASC) 5 MG tablet Take 1 tablet (5 mg total) by mouth daily.  90 tablet  3  . colchicine 0.6 MG tablet Take 0.5 tablets (0.3 mg total) by mouth daily.  60 tablet  2  . esomeprazole (NEXIUM) 40 MG capsule Take 1 capsule (40 mg total) by mouth daily before breakfast.  90 capsule  5  . gabapentin (NEURONTIN) 100 MG capsule Take 100 mg in morning 100 mg in the afternoon and 300 mg at night  450 capsule  2  . polyethylene glycol (MIRALAX / GLYCOLAX) packet Take 17 g by mouth 2 (two) times daily. Hold for a few days if you have more than 3 bowel movements per day.  56 each  5  . sodium bicarbonate 650 MG tablet Take 1 tablet (650 mg total) by mouth 2 (two) times daily.  60 tablet  6   No current facility-administered medications for this visit.    Objective: BP 130/70  Pulse 92  Temp(Src) 98.5 F (36.9 C) (Oral)  Ht 5\' 11"  (1.803 m)  Wt 174 lb (78.926 kg)  BMI 24.28 kg/m2 Gen: NAD, resting comfortably on table HEENT: no scleral icterus CV: RRR no murmurs rubs or gallops Lungs: CTAB no crackles, wheeze, rhonchi Abdomen: no fluid wave or obvious ascites, mildly distended based off overall weight though. Nontender.   Ext: no edema  Skin: warm, dry, no jaundice Neuro: grossly normal, moves all extremities, 5/5 strength lower extremities  Back - Normal skin, Spine with normal alignment and no deformity.  No tenderness to vertebral process palpation.  Paraspinous muscles are very mildly tender but without spasm.   Range of motion is full at neck and lumbar sacral regions Bilateral Knee: Normal to inspection with no erythema or effusion or obvious bony abnormalities. Palpation normal with no warmth or joint line tenderness or patellar tenderness or condyle tenderness. ROM normal in flexion and extension and lower leg rotation. Ligaments with solid consistent endpoints  including ACL, PCL, LCL, MCL. Negative Mcmurray's.   Assessment/Plan:  Alcohol abuse Not reason for visit but uncovered on questioning that patient has started drinking heavily again. Patient interested in inpatient treatment. Gave a list of potential providers. Told patient this is life or death especially given history of encephalopathy in the past. He is agreeable to seek treatment and see me within a week of getting out. Would want to obtain a new set LFTs at that time.   Back pain Well controlled withi minimal vicodin. No red flags. Has not been back to physical therapy and could do this in the future if desired. Discussed with patient that narcotics are not effective long term therapy for back pain and we needed to continue to wean. Also advised I did not feel this was a safe medicine to use given current alcohol abuse. He will follow up for reevaluation once he has been through detox.   Knee pain Patient denies history of steroid injection. Advised patient that appropriate therapy for OA is exercise, knee injections as needed and consider other therapies. Cannot advise Nsaids given CKD and must avoid tylenol (except at low dose) given liver. Will start with standing knee films. If shows OA, could trial steroid injection bilaterally. Patient seems hesitant about this approach but is willing to try.     Orders Placed This Encounter  Procedures  . DG Knee Bilateral Standing AP    Standing Status: Future     Number of Occurrences:      Standing Expiration Date: 04/13/2015    Order Specific Question:  Reason for Exam (SYMPTOM  OR DIAGNOSIS REQUIRED)    Answer:  evaluate osteoarthritis    Order Specific Question:  Preferred imaging location?    Answer:  [facility]    No orders of the defined types were placed in this encounter.

## 2014-02-14 NOTE — Assessment & Plan Note (Addendum)
Well controlled withi minimal vicodin. No red flags. Has not been back to physical therapy and could do this in the future if desired. Discussed with patient that narcotics are not effective long term therapy for back pain and we needed to continue to wean. Also advised I did not feel this was a safe medicine to use given current alcohol abuse. He will follow up for reevaluation once he has been through detox.

## 2014-02-14 NOTE — Assessment & Plan Note (Signed)
Not reason for visit but uncovered on questioning that patient has started drinking heavily again. Patient interested in inpatient treatment. Gave a list of potential providers. Told patient this is life or death especially given history of encephalopathy in the past. He is agreeable to seek treatment and see me within a week of getting out. Would want to obtain a new set LFTs at that time.

## 2014-02-14 NOTE — Assessment & Plan Note (Signed)
Patient denies history of steroid injection. Advised patient that appropriate therapy for OA is exercise, knee injections as needed and consider other therapies. Cannot advise Nsaids given CKD and must avoid tylenol (except at low dose) given liver. Will start with standing knee films. If shows OA, could trial steroid injection bilaterally. Patient seems hesitant about this approach but is willing to try.

## 2014-03-06 ENCOUNTER — Telehealth: Payer: Self-pay | Admitting: Family Medicine

## 2014-03-06 NOTE — Telephone Encounter (Signed)
Which medication is for gout? He has had a flareup He has to work tomorrow Please advise

## 2014-03-06 NOTE — Telephone Encounter (Signed)
Advised pt tat allopurinol is for gout and that he cant take more than one a day (per Dr. Sheral Apley)  But can try 600mg  ibuprofen 3-4 times daily for flare up.  Pt will try this and give [place] a call if he is still having pain. Damire Remedios, Salome Spotted

## 2014-03-07 ENCOUNTER — Other Ambulatory Visit: Payer: Self-pay | Admitting: Family Medicine

## 2014-03-07 ENCOUNTER — Other Ambulatory Visit: Payer: Self-pay | Admitting: *Deleted

## 2014-03-07 ENCOUNTER — Other Ambulatory Visit: Payer: Medicare Other

## 2014-03-07 ENCOUNTER — Ambulatory Visit: Payer: Commercial Managed Care - HMO | Admitting: Oncology

## 2014-03-07 DIAGNOSIS — M109 Gout, unspecified: Secondary | ICD-10-CM

## 2014-03-07 MED ORDER — COLCHICINE 0.6 MG PO TABS: 0.3000 mg | ORAL_TABLET | Freq: Every day | ORAL | Status: DC

## 2014-05-28 ENCOUNTER — Emergency Department (HOSPITAL_COMMUNITY)
Admission: EM | Admit: 2014-05-28 | Discharge: 2014-05-28 | Disposition: A | Discharge status: Home / Self Care | Payer: Medicare HMO | Attending: Emergency Medicine | Admitting: Emergency Medicine

## 2014-05-28 ENCOUNTER — Encounter (HOSPITAL_COMMUNITY): Payer: Self-pay | Admitting: Emergency Medicine

## 2014-05-28 ENCOUNTER — Other Ambulatory Visit: Payer: Self-pay | Admitting: Family Medicine

## 2014-05-28 ENCOUNTER — Emergency Department (HOSPITAL_COMMUNITY): Payer: Medicare HMO

## 2014-05-28 DIAGNOSIS — Z792 Long term (current) use of antibiotics: Secondary | ICD-10-CM | POA: Insufficient documentation

## 2014-05-28 DIAGNOSIS — K130 Diseases of lips: Secondary | ICD-10-CM

## 2014-05-28 DIAGNOSIS — S42443A Displaced fracture (avulsion) of medial epicondyle of unspecified humerus, initial encounter for closed fracture: Secondary | ICD-10-CM

## 2014-05-28 DIAGNOSIS — Y929 Unspecified place or not applicable: Secondary | ICD-10-CM | POA: Insufficient documentation

## 2014-05-28 DIAGNOSIS — L089 Local infection of the skin and subcutaneous tissue, unspecified: Secondary | ICD-10-CM | POA: Insufficient documentation

## 2014-05-28 DIAGNOSIS — W19XXXA Unspecified fall, initial encounter: Secondary | ICD-10-CM

## 2014-05-28 DIAGNOSIS — Y939 Activity, unspecified: Secondary | ICD-10-CM | POA: Insufficient documentation

## 2014-05-28 DIAGNOSIS — Z79899 Other long term (current) drug therapy: Secondary | ICD-10-CM | POA: Insufficient documentation

## 2014-05-28 DIAGNOSIS — I129 Hypertensive chronic kidney disease with stage 1 through stage 4 chronic kidney disease, or unspecified chronic kidney disease: Secondary | ICD-10-CM | POA: Insufficient documentation

## 2014-05-28 DIAGNOSIS — Z862 Personal history of diseases of the blood and blood-forming organs and certain disorders involving the immune mechanism: Secondary | ICD-10-CM | POA: Insufficient documentation

## 2014-05-28 DIAGNOSIS — S42463A Displaced fracture of medial condyle of unspecified humerus, initial encounter for closed fracture: Secondary | ICD-10-CM | POA: Insufficient documentation

## 2014-05-28 DIAGNOSIS — W010XXA Fall on same level from slipping, tripping and stumbling without subsequent striking against object, initial encounter: Secondary | ICD-10-CM | POA: Insufficient documentation

## 2014-05-28 DIAGNOSIS — N189 Chronic kidney disease, unspecified: Secondary | ICD-10-CM | POA: Insufficient documentation

## 2014-05-28 MED ORDER — AMOXICILLIN-POT CLAVULANATE 875-125 MG PO TABS: 1.0000 | ORAL_TABLET | Freq: Two times a day (BID) | ORAL | Status: DC

## 2014-05-28 NOTE — ED Notes (Signed)
Called ortho to come place splint and sling.

## 2014-05-28 NOTE — ED Notes (Signed)
Pt states he slipped and fell on Sunday and had a laceration to the inside of his lower lip. Still has some swelling to lower lip and has been rinsing with peroxide. States also has rt elbow pain.

## 2014-05-28 NOTE — ED Notes (Signed)
Patient states has advil at home and took Sunday night, but hasn't required pain medicine since.

## 2014-05-28 NOTE — ED Provider Notes (Signed)
  Medical screening examination/treatment/procedure(s) were performed by non-physician practitioner and as supervising physician I was immediately available for consultation/collaboration.   EKG Interpretation None         Ayrton Mcvay, MD 05/28/14 1545 

## 2014-05-28 NOTE — ED Notes (Signed)
Ortho still at bedside to place splint/sling.

## 2014-05-28 NOTE — Discharge Instructions (Signed)
You have a fracture of your right elbow. Followup with orthopedics in 2-3 days. You also have an infected lip. It is important for you to take Augmentin, and antibiotic twice daily for the next 10 days. Take the Norco as prescribed by her dentist as directed as needed for pain. No driving or operating heavy machinery while taking Norco as it may cause drowsiness.  Elbow Fracture, Simple A fracture is a break in one of the bones.When fractures are not displaced or separated, they may be treated with only a sling or splint. The sling or splint may only be required for two to three weeks. In these cases, often the elbow is put through early range of motion exercises to prevent the elbow from getting stiff. DIAGNOSIS  The diagnosis (learning what is wrong) of a fractured elbow is made by x-ray. These may be required before and after the elbow is put into a splint or cast. X-rays are taken after to make sure the bone pieces have not moved. HOME CARE INSTRUCTIONS   Only take over-the-counter or prescription medicines for pain, discomfort, or fever as directed by your caregiver.  If you have a splint held on with an elastic wrap and your hand or fingers become numb or cold and blue, loosen the wrap and reapply more loosely. See your caregiver if there is no relief.  You may use ice for twenty minutes, four times per day, for the first two to three days.  Use your elbow as directed.  See your caregiver as directed. It is very important to keep all follow-up referrals and appointments in order to avoid any long-term problems with your elbow including chronic pain or stiffness. SEEK IMMEDIATE MEDICAL CARE IF:   There is swelling or increasing pain in elbow.  You begin to lose feeling or experience numbness or tingling in your hand or fingers.  You develop swelling of the hand and fingers.  You get a cold or blue hand or fingers on affected side. MAKE SURE YOU:   Understand these  instructions.  Will watch your condition.  Will get help right away if you are not doing well or get worse. Document Released: 12/06/2001 Document Revised: 03/05/2012 Document Reviewed: 10/27/2009 [facility] Patient Information 2014 [place], [place].  Facial Infection You have an infection of your face. This requires special attention to help prevent serious problems. Infections in facial wounds can cause poor healing and scars. They can also spread to deeper tissues, especially around the eye. Wound and dental infections can lead to sinusitis, infection of the eye socket, and even meningitis. Permanent damage to the skin, eye, and nervous system may result if facial infections are not treated properly. With severe infections, hospital care for IV antibiotic injections may be needed if they don't respond to oral antibiotics. Antibiotics must be taken for the full course to insure the infection is eliminated. If the infection came from a bad tooth, it may have to be extracted when the infection is under control. Warm compresses may be applied to reduce skin irritation and remove drainage. You might need a tetanus shot now if:  You cannot remember when your last tetanus shot was.  You have never had a tetanus shot.  The object that caused your wound was dirty. If you need a tetanus shot, and you decide not to get one, there is a rare chance of getting tetanus. Sickness from tetanus can be serious. If you got a tetanus shot, your arm may swell, get red  and warm to the touch at the shot site. This is common and not a problem. SEEK IMMEDIATE MEDICAL CARE IF:   You have increased swelling, redness, or trouble breathing.  You have a severe headache, dizziness, nausea, or vomiting.  You develop problems with your eyesight.  You have a fever. Document Released: 01/19/2005 Document Revised: 03/05/2012 Document Reviewed: 12/12/2005 [facility] Patient Information 2014 [place].

## 2014-05-28 NOTE — ED Provider Notes (Signed)
CSN: [id number]     Arrival date & time 05/28/14  1123 History  This chart was scribed for non-physician practitioner Michele Mcalpine PA-C working with Carmin Muskrat, MD, by Thea Alken, ED Scribe. This patient was seen in room TR07C/TR07C and the patient's care was started at 11:43 AM.   Chief Complaint  Patient presents with  . Extremity Pain   Patient is a 53 y.o. male presenting with extremity pain. The history is provided by the patient. No language interpreter was used.  Extremity Pain   William [name] is a 53 y.o. male with h/o arthritis presents to the Emergency Department complaining of fall onset 3 days. Pt reports he tripped over himself causing him to fall and land on his right elbow. Pt now has right elbow pain and a cut on his lip due to fall. Pt believes he worsened his arthritis to right elbow when he fell. Pt has taken advil without relief to pain. Pt has also tried rinsing his mouth with peroxide solution for the cut on his lip. Pt was seen by the dentist earlier today and was prescribe 10 days of amoxicillin for a pulled tooth and 16 norco tablets.  Past Medical History  Diagnosis Date  . COMPRESSION FRACTURE, LUMBAR VERTEBRAE 07/29/2010    Qualifier: Diagnosis of  By: Anabel Bene    . Hypertension   . Chronic kidney disease   . Cirrhosis of liver   . Anemia    Past Surgical History  Procedure Laterality Date  . Achilles tendon repair     Family History  Problem Relation Age of Onset  . Kidney disease Mother   . Arthritis Mother   . Hypertension Father    History  Substance Use Topics  . Smoking status: Never Smoker   . Smokeless tobacco: Not on file  . Alcohol Use: Yes     Comment: 1 pint per week    Review of Systems  Musculoskeletal: Positive for arthralgias.  All other systems reviewed and are negative.  Allergies  Review of patient's allergies indicates no known allergies.  Home Medications   Prior to Admission medications   Medication Sig  Start Date End Date Taking? Authorizing Provider  allopurinol (ZYLOPRIM) 100 MG tablet Take 1 tablet (100 mg total) by mouth daily. 10/14/13 10/14/14  Marin Olp, MD  amLODipine (NORVASC) 5 MG tablet Take 1 tablet (5 mg total) by mouth daily. 05/02/13 05/02/14  Marin Olp, MD  amoxicillin-clavulanate (AUGMENTIN) 875-125 MG per tablet Take 1 tablet by mouth 2 (two) times daily. One po bid x 10 days 05/28/14   Illene Labrador, PA-C  colchicine 0.6 MG tablet Take 0.5 tablets (0.3 mg total) by mouth daily. 03/07/14 03/07/15  Amber Fidel Levy, MD  esomeprazole (NEXIUM) 40 MG capsule Take 1 capsule (40 mg total) by mouth daily before breakfast. 10/24/12 10/24/13  Marin Olp, MD  ferrous sulfate 325 (65 FE) MG EC tablet Take 325 mg by mouth daily with breakfast.      Historical Provider, MD  folic acid (FOLVITE) 1 MG tablet Take 1 tablet (1 mg total) by mouth daily. 07/08/13   Marin Olp, MD  gabapentin (NEURONTIN) 100 MG capsule Take 100 mg in morning 100 mg in the afternoon and 300 mg at night 02/28/12   Lyndal Pulley, DO  HYDROcodone-acetaminophen (NORCO) 5-325 MG per tablet Take 0.5-1 tablets by mouth daily as needed for pain. Next refill allowed January 2014. 10/14/13   Brayton Mars  Hunter, MD  polyethylene glycol (MIRALAX / GLYCOLAX) packet Take 17 g by mouth 2 (two) times daily. Hold for a few days if you have more than 3 bowel movements per day. 10/14/13 10/17/13  Marin Olp, MD  propranolol (INDERAL) 40 MG tablet Take 1 tablet (40 mg total) by mouth 2 (two) times daily. Sent to health department 10/14/13 10/14/14  Marin Olp, MD  sodium bicarbonate 650 MG tablet Take 1 tablet (650 mg total) by mouth 2 (two) times daily. 05/29/12   Lyndal Pulley, DO  spironolactone (ALDACTONE) 25 MG tablet Take 1 tablet (25 mg total) by mouth daily. 01/06/14   Marin Olp, MD   BP 149/95  Pulse 119  Temp(Src) 98.6 F (37 C) (Oral)  Resp 20  Ht 5\' 11"  (1.803 m)  Wt 175 lb (79.379 kg)   BMI 24.42 kg/m2  SpO2 100% Physical Exam  Nursing note and vitals reviewed. Constitutional: He is oriented to person, place, and time. He appears well-developed and well-nourished. No distress.  HENT:  Head: Normocephalic and atraumatic.  Lower Lip - Inside lip there is swelling and anopen area that appears to be infected with granulation tissue.  Eyes: Conjunctivae and EOM are normal.  Neck: Normal range of motion. Neck supple.  Cardiovascular: Regular rhythm and normal heart sounds.  Tachycardia present.   Pulmonary/Chest: Effort normal and breath sounds normal.  Musculoskeletal: Normal range of motion. He exhibits no edema.  Right olecranon process swollen and tender Tenderness to medial and lateral epicondyle Full ROM Pt is able Supinate and pronate  Intact distal pulses  Neurological: He is alert and oriented to person, place, and time.  Skin: Skin is warm and dry.  Psychiatric: He has a normal mood and affect. His behavior is normal.    ED Course  Procedures (including critical care time) Labs Review Labs Reviewed - No data to display  Imaging Review Dg Elbow Complete Right  05/28/2014   CLINICAL DATA:  Fall.  Pain and swelling in the right elbow.  EXAM: RIGHT ELBOW - COMPLETE 3+ VIEW  COMPARISON:  None available.  FINDINGS: The posterior fat pad is elevated compatible with an elbow effusion. A minimally displaced fracture is evident along the medial epicondyle. No other fracture is present. The elbow is located. Soft tissue swelling is present over the olecranon.  IMPRESSION: 1. Minimally displaced fracture along the medial epicondyles. 2. Small elbow effusion.   Electronically Signed   By: Lawrence Santiago M.D.   On: 05/28/2014 12:23     EKG Interpretation None      MDM   Final diagnoses:  Fracture of medial epicondyle of humerus  Infection of lip  Fall    Patient presenting with elbow injury and lip injury after fall. He is well appearing and in no apparent  distress. Elbow x-ray showing minimally displaced fracture along the medial epicondyles with a small elbow effusion. Neurovascularly intact Long-arm splint and sling applied. Regarding with swelling, the area appears to be infected. He was started on amoxicillin which she has not yet filled by the dentist earlier today after having a tooth pulled. I will switch amoxicillin to Augmentin. Advised orthopedic and primary care physician followup. Stable for discharge. Return precautions given. Patient states understanding of treatment care plan and is agreeable.  Illene Labrador, PA-C 05/28/14 1317

## 2014-05-28 NOTE — Progress Notes (Signed)
Orthopedic Tech Progress Note Patient Details:  William [name] Apr 18, 1961 [id number]  Ortho Devices Type of Ortho Device: Long arm splint;Arm sling Ortho Device/Splint Interventions: Application   Theodoro Parma Cammer 05/28/2014, 1:15 PM

## 2014-06-09 ENCOUNTER — Telehealth: Payer: Self-pay | Admitting: Family Medicine

## 2014-06-09 ENCOUNTER — Ambulatory Visit (INDEPENDENT_AMBULATORY_CARE_PROVIDER_SITE_OTHER): Payer: Commercial Managed Care - HMO | Admitting: Family Medicine

## 2014-06-09 ENCOUNTER — Encounter: Payer: Self-pay | Admitting: Family Medicine

## 2014-06-09 VITALS — BP 140/88 | HR 96 | Temp 98.5°F | Wt 170.0 lb

## 2014-06-09 DIAGNOSIS — M549 Dorsalgia, unspecified: Secondary | ICD-10-CM

## 2014-06-09 DIAGNOSIS — S42401A Unspecified fracture of lower end of right humerus, initial encounter for closed fracture: Secondary | ICD-10-CM

## 2014-06-09 DIAGNOSIS — M25569 Pain in unspecified knee: Secondary | ICD-10-CM

## 2014-06-09 DIAGNOSIS — S42409A Unspecified fracture of lower end of unspecified humerus, initial encounter for closed fracture: Secondary | ICD-10-CM

## 2014-06-09 DIAGNOSIS — F1010 Alcohol abuse, uncomplicated: Secondary | ICD-10-CM

## 2014-06-09 NOTE — Assessment & Plan Note (Signed)
Fracture at beginning of June. In splint since 6/3. Referred to [employer]. Unfortunately, we are not on back of [employer] card and patient has never seen provider listed. Gave patient option of self pay vs. Calling his insurance. He is going to contact them and then call us back. He has pain medicine on hand. Decision made to continue splint which seems to still be appropriately fitting until orthopedic follow up. Could consider urgent care visit if unable to get in with [facility].

## 2014-06-09 NOTE — Progress Notes (Signed)
Garret Reddish, MD Phone: [phone]  Subjective:   William [name] is a 53 y.o. year old very pleasant male patient who presents with the following:  Right elbow fracture (medial epicondyle) Patient fell at beginning of June and presented to ED 3 days later. He was found to have a fracture of medial epicodyle and placed in long arm splint and sling. Patient states his pain is much improved since that time. Rates mild pain 1/10 aching in the elbow. Continues to wear splint. Comes today for referral to orthopedics. He is using norco prescribed in ED and has at least 8 pills left.  ROS- no paresthesias in hand or arm, no fever/chills, no redness on skin   Alcohol abuse Drinking a pint of Gin a week. He has contacted a detox center and is planning on going there once he is done managing his right elbow fracture ROS- denies confusion, denies RUQ pain   Past Medical History- MGUS, alcohol abuse, CKD III, alcoholic cirrhosis, hypertension, hx esophageal varices, back pain, likely knee OA   Medications- reviewed and updated Current Outpatient Prescriptions  Medication Sig Dispense Refill  . allopurinol (ZYLOPRIM) 100 MG tablet Take 1 tablet (100 mg total) by mouth daily.  30 tablet  11  . amLODipine (NORVASC) 5 MG tablet Take 1 tablet (5 mg total) by mouth daily.  90 tablet  3  . colchicine 0.6 MG tablet Take 0.5 tablets (0.3 mg total) by mouth daily.  60 tablet  2  . esomeprazole (NEXIUM) 40 MG capsule Take 1 capsule (40 mg total) by mouth daily before breakfast.  90 capsule  5  . ferrous sulfate 325 (65 FE) MG EC tablet Take 325 mg by mouth daily with breakfast.        . folic acid (FOLVITE) 1 MG tablet Take 1 tablet (1 mg total) by mouth daily.  90 tablet  1  . folic acid (FOLVITE) 1 MG tablet TAKE ONE TABLET BY MOUTH EVERY DAY  90 tablet  0  . gabapentin (NEURONTIN) 100 MG capsule Take 100 mg in morning 100 mg in the afternoon and 300 mg at night  450 capsule  2  .  HYDROcodone-acetaminophen (NORCO) 5-325 MG per tablet Take 0.5-1 tablets by mouth daily as needed for pain. Next refill allowed January 2014.  20 tablet  0  . polyethylene glycol (MIRALAX / GLYCOLAX) packet Take 17 g by mouth 2 (two) times daily. Hold for a few days if you have more than 3 bowel movements per day.  56 each  5  . propranolol (INDERAL) 40 MG tablet Take 1 tablet (40 mg total) by mouth 2 (two) times daily. Sent to health department  60 tablet  11  . sodium bicarbonate 650 MG tablet Take 1 tablet (650 mg total) by mouth 2 (two) times daily.  60 tablet  6  . spironolactone (ALDACTONE) 25 MG tablet Take 1 tablet (25 mg total) by mouth daily.  30 tablet  5   No current facility-administered medications for this visit.    Objective: BP 140/88  Pulse 96  Temp(Src) 98.5 F (36.9 C) (Oral)  Wt 170 lb (77.111 kg) Gen: NAD, resting comfortably in chair CV: 2+ radial pulse MSK: right arm in long arm splint Skin: warm, dry, no rash  Neuro: intact distal sensation  Assessment/Plan:  Elbow fracture, right Fracture at beginning of June. In splint since 6/3. Referred to [facility]. Unfortunately, we are not on back of [employer] card and patient has never  seen provider listed. Gave patient option of self pay vs. Calling his insurance. He is going to contact them and then call us back. He has pain medicine on hand. Decision made to continue splint which seems to still be appropriately fitting until orthopedic follow up. Could consider urgent care visit if unable to get in with [facility].   Alcohol abuse Patient plans to go to detox after healing from elbow fracture. Strongly encouraged patient to follow through with this.    Orders Placed This Encounter  Procedures  . Ambulatory referral to Orthopedic Surgery- [place]

## 2014-06-09 NOTE — Patient Instructions (Signed)
We referred you to [facility] for your right elbow. They will take your splint off and likely repeat x-rays and consider cast.   I am thrilled to hear you want to seek detox. I am glad you are ready to do this when your arm is better.   When your arm is better and you are through detox, we would like to see you back to discuss your knee pain (get your x-rays first) then we could consider steroid injections.   It has been a pleasure being your doctor and I will miss you. Thanks for allowing me to be your doctor these few years.  Dr. Yong Channel

## 2014-06-09 NOTE — Telephone Encounter (Signed)
Spoke with Ronalee Belts at [facility] ( ID # [id number] [phone]) He is changing pts PCP in their system and on his card. His card now shows [facility] as PCP Referral can be made

## 2014-06-09 NOTE — Assessment & Plan Note (Signed)
Patient plans to go to detox after healing from elbow fracture. Strongly encouraged patient to follow through with this.

## 2014-06-10 ENCOUNTER — Telehealth: Payer: Self-pay | Admitting: Family Medicine

## 2014-06-10 NOTE — Telephone Encounter (Signed)
When I contacted the number below, I had the same response.  They were still showing the previous MD. Will fax over [employer] referral with Dr. Ronney Lion info and see if this would be approved. [name], Salome Spotted

## 2014-06-10 NOTE — Telephone Encounter (Signed)
LMOVM informing pt that we were still trying to get things straight with [employer].  Please see previous phone note for details. Caulin Begley, Salome Spotted

## 2014-06-10 NOTE — Telephone Encounter (Signed)
Please call patient back regarding referral to orthopedic practice. Spoke with [employer] to have correct primary on card.

## 2014-06-10 NOTE — Telephone Encounter (Signed)
Contacted [employer] they are still showing the wrong MD.  Advised to call [facility] @ [phone]. [name], Salome Spotted

## 2014-06-11 NOTE — Telephone Encounter (Signed)
error 

## 2014-06-25 ENCOUNTER — Telehealth: Payer: Self-pay | Admitting: Family Medicine

## 2014-06-25 NOTE — Telephone Encounter (Signed)
Pt has new insurance card-[employer] Wants to know about referral to [facility]

## 2014-06-26 NOTE — Telephone Encounter (Signed)
Requested pt to bring card to clinic so we can scan it in.  States that Dr. Nori Riis is on the back of his card.  Will go ahead and send referral to [facility], but pt understands that I will need copy before I can call to schedule.   Pt agreeable. [name], Salome Spotted

## 2014-06-30 NOTE — Telephone Encounter (Signed)
LMOVM for pt to return call again. [name], Jessica Dawn  

## 2014-06-30 NOTE — Telephone Encounter (Signed)
Received authorization from [facility].  Appt made for 07/04/14 @ 945am.  LMOVM for pt to return call. William [name], Salome Spotted

## 2014-07-01 NOTE — Telephone Encounter (Signed)
Pt informed and agreeable. William [name] Dawn  

## 2014-07-04 ENCOUNTER — Other Ambulatory Visit: Payer: Self-pay | Admitting: *Deleted

## 2014-07-04 MED ORDER — ESOMEPRAZOLE MAGNESIUM 40 MG PO CPDR: 40.0000 mg | DELAYED_RELEASE_CAPSULE | Freq: Every day | ORAL | Status: DC

## 2014-07-04 NOTE — Telephone Encounter (Signed)
Pt stated he is almost out of medication.  Pt made aware that he may be asked to schedule an appt for an office visit for follow up.  Derl Barrow, RN

## 2014-09-16 ENCOUNTER — Encounter: Payer: Self-pay | Admitting: Family Medicine

## 2014-09-16 ENCOUNTER — Ambulatory Visit (INDEPENDENT_AMBULATORY_CARE_PROVIDER_SITE_OTHER): Payer: Commercial Managed Care - HMO | Admitting: Family Medicine

## 2014-09-16 VITALS — BP 140/90 | HR 60 | Ht 71.0 in | Wt 173.0 lb

## 2014-09-16 DIAGNOSIS — M25569 Pain in unspecified knee: Secondary | ICD-10-CM

## 2014-09-16 DIAGNOSIS — Z23 Encounter for immunization: Secondary | ICD-10-CM

## 2014-09-16 DIAGNOSIS — F1010 Alcohol abuse, uncomplicated: Secondary | ICD-10-CM

## 2014-09-16 DIAGNOSIS — M25562 Pain in left knee: Secondary | ICD-10-CM

## 2014-09-16 DIAGNOSIS — M25561 Pain in right knee: Secondary | ICD-10-CM

## 2014-09-16 MED ORDER — TRAMADOL HCL 50 MG PO TABS: 50.0000 mg | ORAL_TABLET | Freq: Three times a day (TID) | ORAL | Status: DC | PRN

## 2014-09-16 NOTE — Patient Instructions (Signed)
Thank you for coming to the clinic today. It was nice seeing you.  For for knee and back pain, we are going to start a medication called tramadol. You can take this medicine once every 8 hours as needed for pain. If this does not help very much, we can also try a gel called voltaren. You would also likely benefit from knee injections, but you can think about when you would like to have those done.  Contact for AA: Phone: [phone] Website: [email or web address]  See you again in 3 months or sooner if needed.

## 2014-09-16 NOTE — Assessment & Plan Note (Signed)
Likely due to OA. Limited oral analgesic options given CKD and cirrhosis. Will additionally avoid narcotics at this time given patient's history of substance abuse. Will prescribe tramadol 50mg  q8hrs prn. Can consider adding on voltaren gel in the future. Patient may potentially benefit from steroid injections, however patient deferred at this time.

## 2014-09-16 NOTE — Assessment & Plan Note (Signed)
Given contact information for local AA groups.

## 2014-09-16 NOTE — Progress Notes (Signed)
Patient ID: William [name], male   DOB: November 21, 1961, 53 y.o.   MRN: [id number]    William [name] is a 53 y.o. male who presents to the [facility] today with a chief complaint of chronic knee and back pain. His concerns today include:  HPI:  Chronic Knee and Back Pain: Several year history, worsening recently. Located in knees bilaterally and lower back. Starts when waking up in the morning. Worsened with exertion. Tried some OTC "arthritis cream" without much relief. Pain 5/10 at rest, "12/10" at worst.  No chest pain or shortness of breath. No fevers or chills.   ROS: As per HPI  Past Medical History - Reviewed and updated Patient Active Problem List   Diagnosis Date Noted  . Elbow fracture, right 06/09/2014  . History of esophageal varices 08/20/2012  . Shoulder pain, right 06/27/2012  . Gout 06/27/2012  . MGUS (monoclonal gammopathy of unknown significance) 06/15/2012  . Hypertension 02/28/2012  . Cervical pain (neck) 01/16/2012  . Alcohol abuse 01/16/2012  . Chronic kidney disease (CKD), stage III (moderate) 12/13/2011  . Erectile dysfunction 12/13/2011  . Back pain 08/11/2011  . Shoulder pain, left 02/07/2011  . Knee pain 02/07/2011  . ANEMIA, HEMOLYTIC, NON-AUTOIMMUNE 07/29/2010  . Alcoholic cirrhosis of liver 02/23/2010    Medications- reviewed and updated Current Outpatient Prescriptions  Medication Sig Dispense Refill  . esomeprazole (NEXIUM) 40 MG capsule Take 1 capsule (40 mg total) by mouth daily before breakfast.  90 capsule  5  . ferrous sulfate 325 (65 FE) MG EC tablet Take 325 mg by mouth daily with breakfast.        . propranolol (INDERAL) 40 MG tablet Take 1 tablet (40 mg total) by mouth 2 (two) times daily. Sent to health department  60 tablet  11  . spironolactone (ALDACTONE) 25 MG tablet Take 1 tablet (25 mg total) by mouth daily.  30 tablet  5  . allopurinol (ZYLOPRIM) 100 MG tablet Take 1 tablet (100 mg total) by mouth daily.  30 tablet  11  . colchicine 0.6  MG tablet Take 0.5 tablets (0.3 mg total) by mouth daily.  60 tablet  2  . folic acid (FOLVITE) 1 MG tablet Take 1 tablet (1 mg total) by mouth daily.  90 tablet  1  . gabapentin (NEURONTIN) 100 MG capsule Take 100 mg in morning 100 mg in the afternoon and 300 mg at night  450 capsule  2  . HYDROcodone-acetaminophen (NORCO) 5-325 MG per tablet Take 0.5-1 tablets by mouth daily as needed for pain. Next refill allowed January 2014.  20 tablet  0  . polyethylene glycol (MIRALAX / GLYCOLAX) packet Take 17 g by mouth 2 (two) times daily. Hold for a few days if you have more than 3 bowel movements per day.  56 each  5  . traMADol (ULTRAM) 50 MG tablet Take 1 tablet (50 mg total) by mouth every 8 (eight) hours as needed.  30 tablet  2   No current facility-administered medications for this visit.    Objective: Physical Exam: BP 140/90  Pulse 60  Ht 5\' 11"  (1.803 m)  Wt 173 lb (78.472 kg)  BMI 24.14 kg/m2  Gen: NAD, resting comfortably CV: RRR with no murmurs appreciated Lungs: NWOB, CTAB with no crackles, wheezes, or rhonchi Abdomen: Normal bowel sounds present. Soft, Nontender, Nondistended. Ext: Bilateral knee joints tender to palpation along lateral and medial aspects of joint. No effusions. Mild crepitus with passive flexion and extension of knees  bilaterally.  Skin: warm, dry Neuro: grossly normal, moves all extremities  No results found for this or any previous visit (from the past 72 hour(s)).  A/P: See problem list  Knee pain Likely due to OA. Limited oral analgesic options given CKD and cirrhosis. Will additionally avoid narcotics at this time given patient's history of substance abuse. Will prescribe tramadol 50mg  q8hrs prn. Can consider adding on voltaren gel in the future. Patient may potentially benefit from steroid injections, however patient deferred at this time.   Alcohol abuse Given contact information for local AA groups.     No orders of the defined types were placed  in this encounter.    Meds ordered this encounter  Medications  . traMADol (ULTRAM) 50 MG tablet    Sig: Take 1 tablet (50 mg total) by mouth every 8 (eight) hours as needed.    Dispense:  30 tablet    Refill:  2     Phylisha Dix M. Jerline Pain, [place] Resident PGY-1 09/16/2014 4:46 PM

## 2014-09-26 ENCOUNTER — Telehealth: Payer: Self-pay | Admitting: Family Medicine

## 2014-09-26 MED ORDER — ESOMEPRAZOLE MAGNESIUM 40 MG PO CPDR: 40.0000 mg | DELAYED_RELEASE_CAPSULE | Freq: Every day | ORAL | Status: DC

## 2014-09-26 NOTE — Telephone Encounter (Signed)
Pt seen on 9/22 and states that Dr. Jerline Pain was to refill his Nexium. RX never sent to pharmacy. Pls call pt once RX sent.

## 2014-09-26 NOTE — Telephone Encounter (Signed)
Rx was placed in computer in July 2015 but it was labeled "no print".  Resent rx electronically to the pharmacy.  Pt is aware. Jazmin Hartsell,CMA

## 2014-09-30 MED ORDER — ESOMEPRAZOLE MAGNESIUM 40 MG PO CPDR: 40.0000 mg | DELAYED_RELEASE_CAPSULE | Freq: Every day | ORAL | Status: DC

## 2014-09-30 NOTE — Addendum Note (Signed)
Addended by: Christen Bame D on: 09/30/2014 12:54 PM   Modules accepted: Orders

## 2014-09-30 NOTE — Telephone Encounter (Signed)
Pt informed that I resent again.  Unfortunately, when med was resent it again defaulted to "no print".  I have sent electronically again and verified after signing.  Pt is to wait 1-2 hours, call [facility], if it is still not there he is to call me  Troyce Gieske, Salome Spotted

## 2014-09-30 NOTE — Telephone Encounter (Signed)
Pt went to [facility] on Sunday but was told the RX wasn't there Please advise

## 2014-10-01 ENCOUNTER — Telehealth: Payer: Self-pay | Admitting: Family Medicine

## 2014-10-01 NOTE — Telephone Encounter (Signed)
Pt cannot afford Nexium. Pharmacy suggests RX for omeprazole, which is less expensive. Pls call patient once RX has been sent.

## 2014-10-02 MED ORDER — OMEPRAZOLE 40 MG PO CPDR: 40.0000 mg | DELAYED_RELEASE_CAPSULE | Freq: Every day | ORAL | Status: DC

## 2014-10-02 NOTE — Telephone Encounter (Signed)
Omeprazole sent in.  Algis Greenhouse. Jerline Pain, [place] Resident PGY-1 10/02/2014 7:29 PM

## 2014-10-10 ENCOUNTER — Telehealth: Payer: Self-pay | Admitting: Family Medicine

## 2014-10-10 NOTE — Telephone Encounter (Signed)
Pt called and would like Dr. Jerline Pain to call him. He was wanting some information about his disability. jw

## 2014-10-13 NOTE — Telephone Encounter (Signed)
Will forward to MD. Jazmin Hartsell,CMA  

## 2014-10-15 NOTE — Telephone Encounter (Signed)
Spoke with patient and he wanted to know if we can send results of his knee xrays for his disability hearing.  Informed that we don't have anything performed in the computer but that there is an open order.  He is aware that he can go to [facility] or the hospital to have this done.  Would like to know how PCP feels about his disability.  He feels " like they are trying to cut it".  Will forward to MD if he wants to discuss this with him. Khloe Hunkele,CMA

## 2014-11-10 ENCOUNTER — Other Ambulatory Visit: Payer: Self-pay | Admitting: Family Medicine

## 2014-11-10 DIAGNOSIS — M109 Gout, unspecified: Secondary | ICD-10-CM

## 2014-11-10 NOTE — Telephone Encounter (Signed)
Pt called and needs a refill on his allopurinol sent to his pharmacy. jw

## 2014-11-11 MED ORDER — ALLOPURINOL 100 MG PO TABS: 100.0000 mg | ORAL_TABLET | Freq: Every day | ORAL | Status: DC

## 2014-11-11 NOTE — Telephone Encounter (Signed)
Prescription for allopurinol sent in.  Algis Greenhouse. Jerline Pain, [place] Resident PGY-1 11/11/2014 9:42 AM

## 2014-11-18 ENCOUNTER — Other Ambulatory Visit: Payer: Self-pay | Admitting: *Deleted

## 2014-11-18 DIAGNOSIS — M109 Gout, unspecified: Secondary | ICD-10-CM

## 2014-11-18 MED ORDER — ALLOPURINOL 100 MG PO TABS: 100.0000 mg | ORAL_TABLET | Freq: Every day | ORAL | Status: DC

## 2014-11-18 NOTE — Telephone Encounter (Signed)
Refill for allopurinol sent in. Last uric acid wnl: 5.1.

## 2015-01-02 ENCOUNTER — Ambulatory Visit: Payer: Commercial Managed Care - HMO | Admitting: Family Medicine

## 2015-01-06 ENCOUNTER — Encounter: Payer: Self-pay | Admitting: Family Medicine

## 2015-01-06 ENCOUNTER — Ambulatory Visit (INDEPENDENT_AMBULATORY_CARE_PROVIDER_SITE_OTHER): Payer: Commercial Managed Care - HMO | Admitting: Family Medicine

## 2015-01-06 VITALS — BP 131/89 | HR 76 | Temp 98.3°F | Ht 71.0 in | Wt 168.0 lb

## 2015-01-06 DIAGNOSIS — I10 Essential (primary) hypertension: Secondary | ICD-10-CM

## 2015-01-06 DIAGNOSIS — M25562 Pain in left knee: Secondary | ICD-10-CM

## 2015-01-06 DIAGNOSIS — M25561 Pain in right knee: Secondary | ICD-10-CM

## 2015-01-06 DIAGNOSIS — K7030 Alcoholic cirrhosis of liver without ascites: Secondary | ICD-10-CM

## 2015-01-06 LAB — LIPID PANEL
CHOLESTEROL: 139 mg/dL (ref 0–200)
HDL: 80 mg/dL (ref >39)
LDL Cholesterol: 34 mg/dL (ref 0–99)
Total CHOL/HDL Ratio: 1.7 Ratio
Triglycerides: 125 mg/dL (ref <150)
VLDL: 25 mg/dL (ref 0–40)

## 2015-01-06 LAB — POCT GLYCOSYLATED HEMOGLOBIN (HGB A1C): Hemoglobin A1C: 4.2

## 2015-01-06 LAB — BASIC METABOLIC PANEL
BUN: 14 mg/dL (ref 6–23)
CHLORIDE: 96 meq/L (ref 96–112)
CO2: 24 mEq/L (ref 19–32)
Calcium: 8.5 mg/dL (ref 8.4–10.5)
Creat: 1.07 mg/dL (ref 0.50–1.35)
Glucose, Bld: 100 mg/dL — ABNORMAL HIGH (ref 70–99)
POTASSIUM: 3.8 meq/L (ref 3.5–5.3)
Sodium: 131 mEq/L — ABNORMAL LOW (ref 135–145)

## 2015-01-06 MED ORDER — DICLOFENAC SODIUM 1 % TD GEL: 2.0000 g | Freq: Four times a day (QID) | TRANSDERMAL | Status: DC

## 2015-01-06 NOTE — Assessment & Plan Note (Signed)
Reinforced alcohol cessation. Will discuss yearly [place], CBC, CMP, INR monitoring at next visit.

## 2015-01-06 NOTE — Patient Instructions (Addendum)
Thank you for coming to the clinic today. It was nice seeing you.  For your knee pain, we have added a new medication today called Voltaren gel. You can put this on your knees 4 times a day. Continue to take the tramadol as needed. We can consider doing knee injections in the future if you want to.  We will also be checking blood work today.  See you again in 5-6 months or sooner if you need anything.

## 2015-01-06 NOTE — Progress Notes (Signed)
Patient ID: William [name], male   DOB: January 10, 1961, 54 y.o.   MRN: [id number]    William [name] is a 54 y.o. male who presents to the [facility] today with a chief complaint of knee pain. His concerns today include:  HPI:  Knee pain. Bilateral. Secondary to previously diagnosed osteoarthritis. Pain is somewhat improved with tramadol, though 8/10 currently and continues to cause significant pain. No new changes in his pain. Still wants to wait on injections. Is willing to try topical agent today.   No fevers or chills.   Hypertension BP Readings from Last 3 Encounters:  01/06/15 131/89  09/16/14 140/90  06/09/14 140/88   Home BP monitoring- Compliant with medications-yes without side effects ROS-Denies any CP, HA, SOB, blurry vision, LE edema, transient weakness, orthopnea, PND.   ROS: As per HPI, otherwise all systems reviewed and are negative.  Past Medical History - Reviewed and updated Patient Active Problem List   Diagnosis Date Noted  . Elbow fracture, right 06/09/2014  . History of esophageal varices 08/20/2012  . Shoulder pain, right 06/27/2012  . Gout 06/27/2012  . MGUS (monoclonal gammopathy of unknown significance) 06/15/2012  . Hypertension 02/28/2012  . Cervical pain (neck) 01/16/2012  . Alcohol abuse 01/16/2012  . Chronic kidney disease (CKD), stage III (moderate) 12/13/2011  . Erectile dysfunction 12/13/2011  . Back pain 08/11/2011  . Shoulder pain, left 02/07/2011  . Knee pain 02/07/2011  . ANEMIA, HEMOLYTIC, NON-AUTOIMMUNE 07/29/2010  . Alcoholic cirrhosis of liver 02/23/2010    Medications- reviewed and updated Current Outpatient Prescriptions  Medication Sig Dispense Refill  . allopurinol (ZYLOPRIM) 100 MG tablet Take 1 tablet (100 mg total) by mouth daily. 30 tablet 11  . colchicine 0.6 MG tablet Take 0.5 tablets (0.3 mg total) by mouth daily. 60 tablet 2  . esomeprazole (NEXIUM) 40 MG capsule Take 1 capsule (40 mg total) by mouth daily before breakfast.  90 capsule 5  . ferrous sulfate 325 (65 FE) MG EC tablet Take 325 mg by mouth daily with breakfast.      . folic acid (FOLVITE) 1 MG tablet Take 1 tablet (1 mg total) by mouth daily. 90 tablet 1  . diclofenac sodium (VOLTAREN) 1 % GEL Apply 2 g topically 4 (four) times daily. 100 g 5  . gabapentin (NEURONTIN) 100 MG capsule Take 100 mg in morning 100 mg in the afternoon and 300 mg at night 450 capsule 2  . HYDROcodone-acetaminophen (NORCO) 5-325 MG per tablet Take 0.5-1 tablets by mouth daily as needed for pain. Next refill allowed January 2014. 20 tablet 0  . omeprazole (PRILOSEC) 40 MG capsule Take 1 capsule (40 mg total) by mouth daily. 30 capsule 2  . polyethylene glycol (MIRALAX / GLYCOLAX) packet Take 17 g by mouth 2 (two) times daily. Hold for a few days if you have more than 3 bowel movements per day. 56 each 5  . propranolol (INDERAL) 40 MG tablet Take 1 tablet (40 mg total) by mouth 2 (two) times daily. Sent to health department 60 tablet 11  . spironolactone (ALDACTONE) 25 MG tablet Take 1 tablet (25 mg total) by mouth daily. 30 tablet 5  . traMADol (ULTRAM) 50 MG tablet Take 1 tablet (50 mg total) by mouth every 8 (eight) hours as needed. 30 tablet 2   No current facility-administered medications for this visit.    Objective: Physical Exam: BP 131/89 mmHg  Pulse 76  Temp(Src) 98.3 F (36.8 C) (Oral)  Ht 5\' 11"  (1.803  m)  Wt 168 lb (76.204 kg)  BMI 23.44 kg/m2  Gen: NAD, resting comfortably CV: RRR with no murmurs appreciated Lungs: NWOB, CTAB with no crackles, wheezes, or rhonchi Abdomen: Normal bowel sounds present. Soft, Nontender, Nondistended. Ext: Bilateral knees non-erythematous. No edema. Significant crepitus with passive flexion. Good ROM. Normal strength.  Skin: warm, dry Neuro: grossly normal, moves all extremities  Results for orders placed or performed in visit on 01/06/15 (from the past 72 hour(s))  POCT A1C     Status: None   Collection Time: 01/06/15  3:12  PM  Result Value Ref Range   Hemoglobin A1C 4.2     A/P: See problem list  Knee pain A: Secondary to OA. Pain somewhat improved with tramadol. As noted previously, patient's options are limited given his history of CKD, cirrhosis, and alcohol abuse. Has already tried physical therapy. Not ready for steroid injections. Is willing to try topical agent today.  P: Will start voltaren gel today. Can consider re-adressing steroid injections at next meeting, or potential referral to orthopedics for arthroplasty evaluation.   Hypertension At goal. Will continue current therapy, check lipids and A1c today.   Alcoholic cirrhosis of liver Reinforced alcohol cessation. Will discuss yearly [place], CBC, CMP, INR monitoring at next visit.     Orders Placed This Encounter  Procedures  . CBC  . Basic Metabolic Panel  . Lipid Panel  . POCT A1C    Meds ordered this encounter  Medications  . DISCONTD: diclofenac sodium (VOLTAREN) 1 % GEL    Sig: Apply 2 g topically 4 (four) times daily.    Dispense:  100 g    Refill:  5  . diclofenac sodium (VOLTAREN) 1 % GEL    Sig: Apply 2 g topically 4 (four) times daily.    Dispense:  100 g    Refill:  [place]. Jerline Pain, [place] Resident PGY-1 01/06/2015 5:05 PM

## 2015-01-06 NOTE — Assessment & Plan Note (Signed)
At goal. Will continue current therapy, check lipids and A1c today.

## 2015-01-06 NOTE — Assessment & Plan Note (Signed)
A: Secondary to OA. Pain somewhat improved with tramadol. As noted previously, patient's options are limited given his history of CKD, cirrhosis, and alcohol abuse. Has already tried physical therapy. Not ready for steroid injections. Is willing to try topical agent today.  P: Will start voltaren gel today. Can consider re-adressing steroid injections at next meeting, or potential referral to orthopedics for arthroplasty evaluation.

## 2015-01-07 ENCOUNTER — Telehealth: Payer: Self-pay | Admitting: Family Medicine

## 2015-01-07 DIAGNOSIS — K7030 Alcoholic cirrhosis of liver without ascites: Secondary | ICD-10-CM

## 2015-01-07 LAB — CBC
HCT: 27.9 % — ABNORMAL LOW (ref 39.0–52.0)
Hemoglobin: 9.4 g/dL — ABNORMAL LOW (ref 13.0–17.0)
MCH: 33.3 pg (ref 26.0–34.0)
MCHC: 33.7 g/dL (ref 30.0–36.0)
MCV: 98.9 fL (ref 78.0–100.0)
MPV: 11.1 fL (ref 8.6–12.4)
Platelets: 99 10*3/uL — ABNORMAL LOW (ref 150–400)
RBC: 2.82 MIL/uL — ABNORMAL LOW (ref 4.22–5.81)
RDW: 17.3 % — AB (ref 11.5–15.5)
WBC: 2.8 10*3/uL — AB (ref 4.0–10.5)

## 2015-01-07 NOTE — Telephone Encounter (Signed)
Lab results remarkable for worsening normocytic anemia, likely related to cirrhosis and CKD. Possible B12/folate deficiency. Normal colonoscopy 5 years ago. Will obtain anemia panel and hepatic function panel.   ASCVD risk of 9%. May benefit from moderate to high intensity statin therapy, will discuss further at next visit.  William [name]. Jerline Pain, [place] Resident PGY-1 01/07/2015 3:47 PM

## 2015-01-07 NOTE — Telephone Encounter (Signed)
LMOVM for callback. . William [name]  

## 2015-01-08 NOTE — Telephone Encounter (Addendum)
LMOVM or pt to call back. [name], [employer]   Will relay below message to pt when he returns call. [name], Salome Spotted  Can you let the patient know that his blood count is a little low and that I would like to get a couple more labs. It is probably related to his liver and kidney disease, but I want to make sure he isn't deficient in iron, B12, or folate. I have already ordered the labs as future orders.

## 2015-01-09 NOTE — Telephone Encounter (Signed)
LMOVM for pt to return call .Shylyn Younce Dawn  

## 2015-02-26 ENCOUNTER — Other Ambulatory Visit: Payer: Self-pay | Admitting: *Deleted

## 2015-02-26 MED ORDER — PROPRANOLOL HCL 40 MG PO TABS: 40.0000 mg | ORAL_TABLET | Freq: Two times a day (BID) | ORAL | Status: DC

## 2015-02-26 MED ORDER — SPIRONOLACTONE 25 MG PO TABS: 25.0000 mg | ORAL_TABLET | Freq: Every day | ORAL | Status: DC

## 2015-04-29 ENCOUNTER — Other Ambulatory Visit: Payer: Self-pay | Admitting: Family Medicine

## 2015-05-06 ENCOUNTER — Other Ambulatory Visit: Payer: Self-pay | Admitting: *Deleted

## 2015-05-06 MED ORDER — FOLIC ACID 1 MG PO TABS: 1.0000 mg | ORAL_TABLET | Freq: Every day | ORAL | Status: DC

## 2015-05-19 ENCOUNTER — Ambulatory Visit: Payer: Medicare HMO | Admitting: Family Medicine

## 2015-05-29 ENCOUNTER — Ambulatory Visit (INDEPENDENT_AMBULATORY_CARE_PROVIDER_SITE_OTHER): Payer: Commercial Managed Care - HMO | Admitting: Family Medicine

## 2015-05-29 ENCOUNTER — Encounter: Payer: Self-pay | Admitting: Family Medicine

## 2015-05-29 VITALS — BP 138/82 | HR 67 | Temp 98.2°F | Ht 71.0 in | Wt 169.6 lb

## 2015-05-29 DIAGNOSIS — I10 Essential (primary) hypertension: Secondary | ICD-10-CM

## 2015-05-29 DIAGNOSIS — M25561 Pain in right knee: Secondary | ICD-10-CM

## 2015-05-29 DIAGNOSIS — D61818 Other pancytopenia: Secondary | ICD-10-CM | POA: Insufficient documentation

## 2015-05-29 DIAGNOSIS — M545 Low back pain: Secondary | ICD-10-CM | POA: Diagnosis not present

## 2015-05-29 DIAGNOSIS — K7030 Alcoholic cirrhosis of liver without ascites: Secondary | ICD-10-CM

## 2015-05-29 DIAGNOSIS — D649 Anemia, unspecified: Secondary | ICD-10-CM | POA: Diagnosis not present

## 2015-05-29 DIAGNOSIS — M25562 Pain in left knee: Secondary | ICD-10-CM

## 2015-05-29 MED ORDER — TRAMADOL HCL 50 MG PO TABS: 50.0000 mg | ORAL_TABLET | Freq: Three times a day (TID) | ORAL | Status: DC | PRN

## 2015-05-29 MED ORDER — BACLOFEN 10 MG PO TABS: 10.0000 mg | ORAL_TABLET | Freq: Three times a day (TID) | ORAL | Status: DC | PRN

## 2015-05-29 MED ORDER — FOLIC ACID 1 MG PO TABS: 1.0000 mg | ORAL_TABLET | Freq: Every day | ORAL | Status: DC

## 2015-05-29 NOTE — Assessment & Plan Note (Signed)
Discussed yearly CBC, CMP, INR, and [place] today. Labs ordered as future orders as patient had another appointment to make.

## 2015-05-29 NOTE — Patient Instructions (Signed)
Thank you for coming to the clinic today. It was nice seeing you.  For your knee pain, we will continue with the tramadol today. If you change your mind about having injections, please let us know.  For your back pain, we will start a muscle relaxer called baclofen today. Take this up to 3 times a day as needed. This medication can make you drowsy, so make sure that you are not planning on driving prior to taking it. We also sent in a referral to the pain clinic.  For your blood pressure, keep taking your medications.  We would like to redraw blood work because you were slightly anemic during our last visit. You can come back to the office for these. We also would like to obtain an ultrasound of your liver to screen for colon cancer. When you are ready for this, please let us know.  See you again in 3-6 months or sooner if you need anything else.

## 2015-05-29 NOTE — Progress Notes (Signed)
Patient ID: William [name], male   DOB: 08-15-1961, 54 y.o.   MRN: [id number]    William [name] is a 54 y.o. male who presents to the [facility] today with a chief complaint of knee pain. His concerns today include:  HPI:  Knee Pain Bilateral. Secondary to previously diagnosed osteoarthritis. Pain is somewhat improved with tramadol, though 8/10 currently and continues to cause significant pain. No new changes in his pain. Has been unable to afford voltaren gel. Still does not want to try steroids.   Low Back pain. Stable since last visit. No significant relief with tramadol. Pain is described as sharp and tingling. Has not been taking gabapentin. No bowel or bladder incontinence. No LE weakness. No saddle anesthesia. No fevers or chills.   Hypertension BP Readings from Last 3 Encounters:  05/29/15 138/82  01/06/15 131/89  09/16/14 140/90   Home BP monitoring-No Compliant with medications-yes without side effects ROS-Denies any CP, HA, SOB, blurry vision, LE edema, transient weakness, orthopnea, PND.   ROS: As per HPI, otherwise all systems reviewed and are negative.  Past Medical History - Reviewed and updated Patient Active Problem List   Diagnosis Date Noted  . Normocytic anemia 05/29/2015  . History of esophageal varices 08/20/2012  . Shoulder pain, right 06/27/2012  . Gout 06/27/2012  . MGUS (monoclonal gammopathy of unknown significance) 06/15/2012  . Hypertension 02/28/2012  . Cervical pain (neck) 01/16/2012  . Alcohol abuse 01/16/2012  . Chronic kidney disease (CKD), stage III (moderate) 12/13/2011  . Erectile dysfunction 12/13/2011  . Back pain 08/11/2011  . Shoulder pain, left 02/07/2011  . Knee pain 02/07/2011  . Alcoholic cirrhosis of liver 02/23/2010    Medications- reviewed and updated Current Outpatient Prescriptions  Medication Sig Dispense Refill  . allopurinol (ZYLOPRIM) 100 MG tablet Take 1 tablet (100 mg total) by mouth daily. 30 tablet 11  . baclofen  (LIORESAL) 10 MG tablet Take 1 tablet (10 mg total) by mouth 3 (three) times daily as needed for muscle spasms. 30 each 3  . colchicine 0.6 MG tablet Take 0.5 tablets (0.3 mg total) by mouth daily. 60 tablet 2  . diclofenac sodium (VOLTAREN) 1 % GEL Apply 2 g topically 4 (four) times daily. 100 g 5  . ferrous sulfate 325 (65 FE) MG EC tablet Take 325 mg by mouth daily with breakfast.      . folic acid (FOLVITE) 1 MG tablet Take 1 tablet (1 mg total) by mouth daily. 90 tablet 3  . gabapentin (NEURONTIN) 100 MG capsule Take 100 mg in morning 100 mg in the afternoon and 300 mg at night 450 capsule 2  . omeprazole (PRILOSEC) 40 MG capsule TAKE ONE CAPSULE BY MOUTH ONCE DAILY 30 capsule 0  . polyethylene glycol (MIRALAX / GLYCOLAX) packet Take 17 g by mouth 2 (two) times daily. Hold for a few days if you have more than 3 bowel movements per day. 56 each 5  . propranolol (INDERAL) 40 MG tablet Take 1 tablet (40 mg total) by mouth 2 (two) times daily. 60 tablet 11  . spironolactone (ALDACTONE) 25 MG tablet Take 1 tablet (25 mg total) by mouth daily. 30 tablet 5  . traMADol (ULTRAM) 50 MG tablet Take 1 tablet (50 mg total) by mouth every 8 (eight) hours as needed. 30 tablet 2   No current facility-administered medications for this visit.    Objective: Physical Exam: BP 138/82 mmHg  Pulse 67  Temp(Src) 98.2 F (36.8 C) (Oral)  Ht  5\' 11"  (1.803 m)  Wt 169 lb 9 oz (76.913 kg)  BMI 23.66 kg/m2  Gen: NAD, resting comfortably CV: RRR with no murmurs appreciated Lungs: NWOB, CTAB with no crackles, wheezes, or rhonchi Abdomen: Normal bowel sounds present. Soft, Nontender, Nondistended. Ext: Bilateral knees non-erythematous. No edema. Significant crepitus with passive flexion. Good ROM. Normal strength.  Back: Tender to palpation along paraspinous muscles in lumbar back. Spine without stepoffs or deformities.  Skin: warm, dry Neuro: grossly normal, moves all extremities. Normal LE strength.   A/P:  See problem list  Knee pain Stable. Will continue tramadol. Offered injection again today, however patient declined. Limited options given history of alcohol abuse and cirrhosis.    Back pain Stable. No red flag symptoms. Encouraged patient to restart gabapentin. Will also start baclofen today. Referral sent in for pain clinic to explore further options - patient is not a good candidate for long term narcotic use.    Hypertension Well controlled today. Continue current medications.    Alcoholic cirrhosis of liver Discussed yearly CBC, CMP, INR, and [place] today. Labs ordered as future orders as patient had another appointment to make.    Normocytic anemia Likely a combination of renal and hepatic disease. Will obtain repeat CBC, iron studies, B12, and folate.      Orders Placed This Encounter  Procedures  . CBC    Standing Status: Future     Number of Occurrences:      Standing Expiration Date: 05/28/2016  . Ambulatory referral to Pain Clinic    Referral Priority:  Routine    Referral Type:  Consultation    Referral Reason:  Specialty Services Required    Requested Specialty:  Pain Medicine    Number of Visits Requested:  1    Meds ordered this encounter  Medications  . folic acid (FOLVITE) 1 MG tablet    Sig: Take 1 tablet (1 mg total) by mouth daily.    Dispense:  90 tablet    Refill:  3  . traMADol (ULTRAM) 50 MG tablet    Sig: Take 1 tablet (50 mg total) by mouth every 8 (eight) hours as needed.    Dispense:  30 tablet    Refill:  2  . baclofen (LIORESAL) 10 MG tablet    Sig: Take 1 tablet (10 mg total) by mouth 3 (three) times daily as needed for muscle spasms.    Dispense:  30 each    Refill:  [place]. Jerline Pain, [place] Resident PGY-1 05/29/2015 5:13 PM

## 2015-05-29 NOTE — Assessment & Plan Note (Signed)
Stable. No red flag symptoms. Encouraged patient to restart gabapentin. Will also start baclofen today. Referral sent in for pain clinic to explore further options - patient is not a good candidate for long term narcotic use.

## 2015-05-29 NOTE — Assessment & Plan Note (Signed)
Stable. Will continue tramadol. Offered injection again today, however patient declined. Limited options given history of alcohol abuse and cirrhosis.

## 2015-05-29 NOTE — Assessment & Plan Note (Signed)
Likely a combination of renal and hepatic disease. Will obtain repeat CBC, iron studies, B12, and folate.

## 2015-05-29 NOTE — Assessment & Plan Note (Signed)
Well controlled today. Continue current medications 

## 2015-06-25 ENCOUNTER — Other Ambulatory Visit: Payer: Self-pay | Admitting: Family Medicine

## 2015-09-09 ENCOUNTER — Encounter: Payer: Self-pay | Admitting: Physical Medicine & Rehabilitation

## 2015-10-08 ENCOUNTER — Ambulatory Visit: Payer: Commercial Managed Care - HMO | Admitting: Physical Medicine & Rehabilitation

## 2015-10-09 ENCOUNTER — Other Ambulatory Visit: Payer: Self-pay | Admitting: *Deleted

## 2015-10-09 DIAGNOSIS — M109 Gout, unspecified: Secondary | ICD-10-CM

## 2015-10-12 MED ORDER — COLCHICINE 0.6 MG PO TABS: 0.3000 mg | ORAL_TABLET | Freq: Every day | ORAL | Status: DC

## 2015-10-12 NOTE — Telephone Encounter (Signed)
Rx filled. Patient will need uric acid level drawn with his next set of labs.  Algis Greenhouse. Jerline Pain, [employer] Resident PGY-2 10/12/2015 11:20 AM

## 2015-11-25 ENCOUNTER — Other Ambulatory Visit: Payer: Self-pay | Admitting: Family Medicine

## 2015-11-26 NOTE — Telephone Encounter (Signed)
Rx filled.  Algis Greenhouse. Jerline Pain, [place] Resident PGY-2 11/26/2015 1:40 PM

## 2015-12-25 ENCOUNTER — Ambulatory Visit: Payer: Commercial Managed Care - HMO | Admitting: Physical Medicine & Rehabilitation

## 2016-02-08 ENCOUNTER — Encounter: Payer: Self-pay | Admitting: Family Medicine

## 2016-02-08 ENCOUNTER — Ambulatory Visit (INDEPENDENT_AMBULATORY_CARE_PROVIDER_SITE_OTHER): Payer: Commercial Managed Care - HMO | Admitting: Family Medicine

## 2016-02-08 VITALS — BP 131/96 | HR 83 | Temp 98.2°F | Ht 71.0 in | Wt 173.8 lb

## 2016-02-08 DIAGNOSIS — I10 Essential (primary) hypertension: Secondary | ICD-10-CM

## 2016-02-08 DIAGNOSIS — D509 Iron deficiency anemia, unspecified: Secondary | ICD-10-CM

## 2016-02-08 DIAGNOSIS — G8929 Other chronic pain: Secondary | ICD-10-CM

## 2016-02-08 DIAGNOSIS — M109 Gout, unspecified: Secondary | ICD-10-CM

## 2016-02-08 DIAGNOSIS — Z79899 Other long term (current) drug therapy: Secondary | ICD-10-CM | POA: Diagnosis not present

## 2016-02-08 DIAGNOSIS — M545 Low back pain: Secondary | ICD-10-CM

## 2016-02-08 DIAGNOSIS — K7030 Alcoholic cirrhosis of liver without ascites: Secondary | ICD-10-CM | POA: Diagnosis not present

## 2016-02-08 LAB — COMPLETE METABOLIC PANEL WITH GFR
ALBUMIN: 4.1 g/dL (ref 3.6–5.1)
ALK PHOS: 161 U/L — AB (ref 40–115)
ALT: 48 U/L — ABNORMAL HIGH (ref 9–46)
AST: 221 U/L — AB (ref 10–35)
BUN: 9 mg/dL (ref 7–25)
CALCIUM: 8.3 mg/dL — AB (ref 8.6–10.3)
CO2: 24 mmol/L (ref 20–31)
CREATININE: 1.06 mg/dL (ref 0.70–1.33)
Chloride: 93 mmol/L — ABNORMAL LOW (ref 98–110)
GFR, Est African American: >89 mL/min (ref >=60)
GFR, Est Non African American: 78 mL/min (ref >=60)
Glucose, Bld: 97 mg/dL (ref 65–99)
Potassium: 3.7 mmol/L (ref 3.5–5.3)
Sodium: 131 mmol/L — ABNORMAL LOW (ref 135–146)
Total Bilirubin: 2.5 mg/dL — ABNORMAL HIGH (ref 0.2–1.2)
Total Protein: 7.8 g/dL (ref 6.1–8.1)

## 2016-02-08 LAB — URIC ACID: Uric Acid, Serum: 6.5 mg/dL (ref 4.0–7.8)

## 2016-02-08 LAB — PROTIME-INR
INR: 1.06 (ref <1.50)
Prothrombin Time: 13.9 seconds (ref 11.6–15.2)

## 2016-02-08 LAB — POCT GLYCOSYLATED HEMOGLOBIN (HGB A1C): Hemoglobin A1C: 4.4

## 2016-02-08 LAB — LIPID PANEL
Cholesterol: 179 mg/dL (ref 125–200)
HDL: 105 mg/dL (ref >=40)
LDL CALC: 57 mg/dL (ref <130)
TRIGLYCERIDES: 85 mg/dL (ref <150)
Total CHOL/HDL Ratio: 1.7 Ratio (ref <=5.0)
VLDL: 17 mg/dL (ref <30)

## 2016-02-08 MED ORDER — GABAPENTIN 100 MG PO CAPS: 100.0000 mg | ORAL_CAPSULE | Freq: Three times a day (TID) | ORAL | Status: DC

## 2016-02-08 NOTE — Assessment & Plan Note (Signed)
Well controlled. Continue colchicine and allopurinol. Will check uric acid level today.

## 2016-02-08 NOTE — Assessment & Plan Note (Signed)
Check CMP, CBC, INR today. Will schedule liver US to monitor for [facility]. Continue spironolactone, propranolol, and miralax.

## 2016-02-08 NOTE — Assessment & Plan Note (Signed)
Well controlled today. Continue propranolol and spironolactone. Will check CMP today.

## 2016-02-08 NOTE — Assessment & Plan Note (Signed)
Stable today. No red flag signs or symptoms.  Will restart low dose gabapentin. Continue tramadol. Limited options for treatment given history of alcohol abuse and cirrhosis in addition to history of CKD. Referral again placed to pain clinic per patient request. Follow up in 3 months.

## 2016-02-08 NOTE — Progress Notes (Signed)
Subjective:  William [name] is a 55 y.o. male who presents to the [facility] today with a chief complaint of chronic pain follow up.   HPI:  Chronic Knee and Back Pain Patient with bilateral knee pain secondary to OA and low back pain secondary to degenerative disc disease. Pain is stable since last visit. Is currently taking baclofen for muscle spasms which helps with the pain. Also occasionally uses tramadol which helps some. HAs not been taking gabapentin. No changes in pain. No fevers or chills. No bowl or bladder incontinence. No weakness or numbness. No saddle anesthesia.   Hypertension BP Readings from Last 3 Encounters:  02/08/16 131/96  05/29/15 138/82  01/06/15 131/89   Home BP monitoring-No Compliant with medications-yes, without side effects ROS-Denies any CP, HA, SOB, blurry vision, LE edema, transient weakness, orthopnea, PND.   Gout Stable. Compliant with colchicine and allopurinol without side effects. No recent gout flares.   Cirrhosis Compliant with spironolactone and propranolol. On Miralax three times daily. No abdominal pain or swelling. No dark or tarry stools.   ROS: Per HPI  PMH:  The following were reviewed and entered/updated in epic: Past Medical History  Diagnosis Date  . COMPRESSION FRACTURE, LUMBAR VERTEBRAE 07/29/2010    Qualifier: Diagnosis of  By: Anabel Bene    . Hypertension   . Chronic kidney disease   . Cirrhosis of liver ([place])   . Anemia    Patient Active Problem List   Diagnosis Date Noted  . Chronic pain 02/08/2016  . Normocytic anemia 05/29/2015  . History of esophageal varices 08/20/2012  . Shoulder pain, right 06/27/2012  . Gout 06/27/2012  . MGUS (monoclonal gammopathy of unknown significance) 06/15/2012  . Hypertension 02/28/2012  . Cervical pain (neck) 01/16/2012  . Alcohol abuse 01/16/2012  . Chronic kidney disease (CKD), stage III (moderate) 12/13/2011  . Erectile dysfunction 12/13/2011  . Back pain 08/11/2011  .  Shoulder pain, left 02/07/2011  . Knee pain 02/07/2011  . Alcoholic cirrhosis of liver ([place]) 02/23/2010   Past Surgical History  Procedure Laterality Date  . Achilles tendon repair     Objective:  Physical Exam: BP 131/96 mmHg  Pulse 83  Temp(Src) 98.2 F (36.8 C) (Oral)  Ht 5\' 11"  (1.803 m)  Wt 173 lb 12.8 oz (78.835 kg)  BMI 24.25 kg/m2  Gen: NAD, resting comfortably CV: RRR with no murmurs appreciated Pulm: NWOB, CTAB with no crackles, wheezes, or rhonchi GI: Normal bowel sounds present. Soft, Nontender, Nondistended. MSK: no edema, cyanosis, or clubbing noted - Knees: Non-erythematous bilaterally. No edema. Significant crepitus with passive flexion. Good ROM. Normal strength. - Back: No tenderness to palpation. No deformities. - Gait: Walks with a limp Skin: warm, dry Neuro: grossly normal, moves all extremities Psych: Normal affect and thought content  Assessment/Plan:  Chronic pain Stable today. No red flag signs or symptoms.  Will restart low dose gabapentin. Continue tramadol. Limited options for treatment given history of alcohol abuse and cirrhosis in addition to history of CKD. Referral again placed to pain clinic per patient request. Follow up in 3 months.   Hypertension Well controlled today. Continue propranolol and spironolactone. Will check CMP today.   Gout Well controlled. Continue colchicine and allopurinol. Will check uric acid level today.   Alcoholic cirrhosis of liver Check CMP, CBC, INR today. Will schedule liver US to monitor for [facility]. Continue spironolactone, propranolol, and miralax.   Algis Greenhouse. Jerline Pain, [place] [facility] Resident PGY-2 02/08/2016 12:30  PM    

## 2016-02-08 NOTE — Patient Instructions (Signed)
We will send in another referral to the pain clinic. Please restart the gabapentin. Take one pill at night for 3 days. If this goes well, increased to 1 pillin the morning and 1 pill at night. If this goes well, increase to 1 pill three times a day.  We will check your gout level, blood counts, liver function, electrolytes, and blood sugar today.  We will order a liver ultrasound today.  Please come back in 2-3 months, or sooner if you need anything else.  Take care,  Dr Jerline Pain

## 2016-02-09 ENCOUNTER — Encounter: Payer: Self-pay | Admitting: Family Medicine

## 2016-02-09 ENCOUNTER — Other Ambulatory Visit: Payer: Self-pay | Admitting: Family Medicine

## 2016-02-09 DIAGNOSIS — K7030 Alcoholic cirrhosis of liver without ascites: Secondary | ICD-10-CM

## 2016-02-09 LAB — CBC WITH DIFFERENTIAL/PLATELET
BASOS ABS: 0 10*3/uL (ref 0.0–0.1)
Basophils Relative: 1 % (ref 0–1)
EOS PCT: 1 % (ref 0–5)
Eosinophils Absolute: 0 10*3/uL (ref 0.0–0.7)
HEMATOCRIT: 30.4 % — AB (ref 39.0–52.0)
HEMOGLOBIN: 10.3 g/dL — AB (ref 13.0–17.0)
Lymphocytes Relative: 34 % (ref 12–46)
Lymphs Abs: 0.7 10*3/uL (ref 0.7–4.0)
MCH: 33.6 pg (ref 26.0–34.0)
MCHC: 33.9 g/dL (ref 30.0–36.0)
MCV: 99 fL (ref 78.0–100.0)
MPV: 11.6 fL (ref 8.6–12.4)
Monocytes Absolute: 0.1 10*3/uL (ref 0.1–1.0)
Monocytes Relative: 4 % (ref 3–12)
NEUTROS PCT: 60 % (ref 43–77)
Neutro Abs: 1.3 10*3/uL — ABNORMAL LOW (ref 1.7–7.7)
RBC: 3.07 MIL/uL — ABNORMAL LOW (ref 4.22–5.81)
RDW: 14.6 % (ref 11.5–15.5)
WBC: 2.2 10*3/uL — AB (ref 4.0–10.5)

## 2016-02-19 ENCOUNTER — Ambulatory Visit (HOSPITAL_COMMUNITY): Payer: Commercial Managed Care - HMO

## 2016-02-19 ENCOUNTER — Telehealth: Payer: Self-pay | Admitting: *Deleted

## 2016-02-19 MED ORDER — COLCHICINE 0.6 MG PO TABS: 0.3000 mg | ORAL_TABLET | Freq: Every day | ORAL | Status: DC

## 2016-02-19 NOTE — Telephone Encounter (Signed)
New Rx sent in.  Algis Greenhouse. Jerline Pain, [place] [facility] Resident PGY-2 02/19/2016 4:27 PM

## 2016-02-19 NOTE — Telephone Encounter (Signed)
Prior Authorization received from [facility] for Colchicine 0.6 mg. [employer] prefers Colcrys 0.6 mg.  Please change or complete PA.   Derl Barrow, RN

## 2016-03-04 ENCOUNTER — Ambulatory Visit (HOSPITAL_COMMUNITY)
Admission: RE | Admit: 2016-03-04 | Discharge: 2016-03-04 | Disposition: A | Discharge status: Home / Self Care | Payer: Commercial Managed Care - HMO | Source: Ambulatory Visit | Attending: Family Medicine | Admitting: Family Medicine

## 2016-03-04 DIAGNOSIS — K7030 Alcoholic cirrhosis of liver without ascites: Secondary | ICD-10-CM | POA: Diagnosis not present

## 2016-03-04 DIAGNOSIS — K8020 Calculus of gallbladder without cholecystitis without obstruction: Secondary | ICD-10-CM | POA: Insufficient documentation

## 2016-03-07 ENCOUNTER — Encounter: Payer: Self-pay | Admitting: Family Medicine

## 2016-07-06 ENCOUNTER — Encounter
Payer: Commercial Managed Care - HMO | Attending: Physical Medicine & Rehabilitation | Admitting: Physical Medicine & Rehabilitation

## 2016-08-08 ENCOUNTER — Other Ambulatory Visit: Payer: Self-pay | Admitting: Family Medicine

## 2016-08-09 NOTE — Telephone Encounter (Signed)
Rx filled.  Algis Greenhouse. Jerline Pain, [place] Resident PGY-3 08/09/2016 11:16 AM

## 2016-08-10 ENCOUNTER — Encounter: Payer: Commercial Managed Care - HMO | Admitting: Physical Medicine & Rehabilitation

## 2016-09-09 ENCOUNTER — Encounter (HOSPITAL_COMMUNITY): Payer: Self-pay | Admitting: Emergency Medicine

## 2016-09-09 ENCOUNTER — Observation Stay (HOSPITAL_COMMUNITY): Payer: Commercial Managed Care - HMO

## 2016-09-09 ENCOUNTER — Inpatient Hospital Stay (HOSPITAL_COMMUNITY)
Admission: EM | Admit: 2016-09-09 | Discharge: 2016-09-14 | DRG: 809 | Disposition: A | Discharge status: Home / Self Care | Payer: Commercial Managed Care - HMO | Attending: Family Medicine | Admitting: Family Medicine

## 2016-09-09 ENCOUNTER — Ambulatory Visit (INDEPENDENT_AMBULATORY_CARE_PROVIDER_SITE_OTHER): Payer: Commercial Managed Care - HMO | Admitting: Family Medicine

## 2016-09-09 ENCOUNTER — Other Ambulatory Visit: Payer: Self-pay

## 2016-09-09 ENCOUNTER — Encounter: Payer: Self-pay | Admitting: Family Medicine

## 2016-09-09 VITALS — BP 126/79 | HR 135 | Temp 98.5°F | Ht 71.0 in | Wt 162.0 lb

## 2016-09-09 DIAGNOSIS — E872 Acidosis: Secondary | ICD-10-CM | POA: Diagnosis present

## 2016-09-09 DIAGNOSIS — D649 Anemia, unspecified: Secondary | ICD-10-CM | POA: Diagnosis not present

## 2016-09-09 DIAGNOSIS — N179 Acute kidney failure, unspecified: Secondary | ICD-10-CM | POA: Diagnosis present

## 2016-09-09 DIAGNOSIS — E440 Moderate protein-calorie malnutrition: Secondary | ICD-10-CM | POA: Diagnosis present

## 2016-09-09 DIAGNOSIS — K639 Disease of intestine, unspecified: Secondary | ICD-10-CM

## 2016-09-09 DIAGNOSIS — K8020 Calculus of gallbladder without cholecystitis without obstruction: Secondary | ICD-10-CM | POA: Diagnosis present

## 2016-09-09 DIAGNOSIS — N183 Chronic kidney disease, stage 3 (moderate): Secondary | ICD-10-CM | POA: Diagnosis present

## 2016-09-09 DIAGNOSIS — E860 Dehydration: Secondary | ICD-10-CM | POA: Diagnosis present

## 2016-09-09 DIAGNOSIS — I700 Atherosclerosis of aorta: Secondary | ICD-10-CM | POA: Diagnosis present

## 2016-09-09 DIAGNOSIS — M109 Gout, unspecified: Secondary | ICD-10-CM | POA: Diagnosis present

## 2016-09-09 DIAGNOSIS — I8500 Esophageal varices without bleeding: Secondary | ICD-10-CM | POA: Diagnosis present

## 2016-09-09 DIAGNOSIS — D472 Monoclonal gammopathy: Secondary | ICD-10-CM | POA: Diagnosis present

## 2016-09-09 DIAGNOSIS — R5383 Other fatigue: Secondary | ICD-10-CM | POA: Diagnosis not present

## 2016-09-09 DIAGNOSIS — K219 Gastro-esophageal reflux disease without esophagitis: Secondary | ICD-10-CM | POA: Diagnosis present

## 2016-09-09 DIAGNOSIS — R630 Anorexia: Secondary | ICD-10-CM | POA: Diagnosis present

## 2016-09-09 DIAGNOSIS — Z8546 Personal history of malignant neoplasm of prostate: Secondary | ICD-10-CM

## 2016-09-09 DIAGNOSIS — R1115 Cyclical vomiting syndrome unrelated to migraine: Secondary | ICD-10-CM

## 2016-09-09 DIAGNOSIS — E876 Hypokalemia: Secondary | ICD-10-CM

## 2016-09-09 DIAGNOSIS — E871 Hypo-osmolality and hyponatremia: Secondary | ICD-10-CM | POA: Diagnosis present

## 2016-09-09 DIAGNOSIS — R197 Diarrhea, unspecified: Secondary | ICD-10-CM

## 2016-09-09 DIAGNOSIS — K7460 Unspecified cirrhosis of liver: Secondary | ICD-10-CM

## 2016-09-09 DIAGNOSIS — D619 Aplastic anemia, unspecified: Secondary | ICD-10-CM

## 2016-09-09 DIAGNOSIS — R066 Hiccough: Secondary | ICD-10-CM | POA: Diagnosis present

## 2016-09-09 DIAGNOSIS — D61818 Other pancytopenia: Principal | ICD-10-CM | POA: Diagnosis present

## 2016-09-09 DIAGNOSIS — R634 Abnormal weight loss: Secondary | ICD-10-CM

## 2016-09-09 DIAGNOSIS — I129 Hypertensive chronic kidney disease with stage 1 through stage 4 chronic kidney disease, or unspecified chronic kidney disease: Secondary | ICD-10-CM | POA: Diagnosis present

## 2016-09-09 DIAGNOSIS — K7030 Alcoholic cirrhosis of liver without ascites: Secondary | ICD-10-CM | POA: Diagnosis present

## 2016-09-09 DIAGNOSIS — K7010 Alcoholic hepatitis without ascites: Secondary | ICD-10-CM | POA: Diagnosis present

## 2016-09-09 HISTORY — DX: Malignant neoplasm of prostate: C61

## 2016-09-09 LAB — COMPREHENSIVE METABOLIC PANEL
ALT: 26 U/L (ref 17–63)
AST: 144 U/L — AB (ref 15–41)
Albumin: 3.1 g/dL — ABNORMAL LOW (ref 3.5–5.0)
Alkaline Phosphatase: 138 U/L — ABNORMAL HIGH (ref 38–126)
Anion gap: 18 — ABNORMAL HIGH (ref 5–15)
BUN: 16 mg/dL (ref 6–20)
CHLORIDE: 89 mmol/L — AB (ref 101–111)
CO2: 21 mmol/L — ABNORMAL LOW (ref 22–32)
Calcium: 6.2 mg/dL — CL (ref 8.9–10.3)
Creatinine, Ser: 1.47 mg/dL — ABNORMAL HIGH (ref 0.61–1.24)
GFR, EST AFRICAN AMERICAN: 59 mL/min — AB (ref >60)
GFR, EST NON AFRICAN AMERICAN: 51 mL/min — AB (ref >60)
Glucose, Bld: 103 mg/dL — ABNORMAL HIGH (ref 65–99)
POTASSIUM: 2.7 mmol/L — AB (ref 3.5–5.1)
Sodium: 128 mmol/L — ABNORMAL LOW (ref 135–145)
Total Bilirubin: 1.9 mg/dL — ABNORMAL HIGH (ref 0.3–1.2)
Total Protein: 7 g/dL (ref 6.5–8.1)

## 2016-09-09 LAB — CBC WITH DIFFERENTIAL/PLATELET
BASOS ABS: 0 10*3/uL (ref 0.0–0.1)
Basophils Relative: 1 %
EOS ABS: 0 10*3/uL (ref 0.0–0.7)
Eosinophils Relative: 1 %
HCT: 17.5 % — ABNORMAL LOW (ref 39.0–52.0)
Hemoglobin: 5.7 g/dL — CL (ref 13.0–17.0)
LYMPHS ABS: 0.3 10*3/uL — AB (ref 0.7–4.0)
Lymphocytes Relative: 22 %
MCH: 32.8 pg (ref 26.0–34.0)
MCHC: 32.6 g/dL (ref 30.0–36.0)
MCV: 100.6 fL — ABNORMAL HIGH (ref 78.0–100.0)
Monocytes Absolute: 0.2 10*3/uL (ref 0.1–1.0)
Monocytes Relative: 12 %
NEUTROS ABS: 0.9 10*3/uL — AB (ref 1.7–7.7)
Neutrophils Relative %: 64 %
PLATELETS: 89 10*3/uL — AB (ref 150–400)
RBC: 1.74 MIL/uL — ABNORMAL LOW (ref 4.22–5.81)
RDW: 17.4 % — AB (ref 11.5–15.5)
WBC: 1.4 10*3/uL — CL (ref 4.0–10.5)

## 2016-09-09 LAB — SALICYLATE LEVEL

## 2016-09-09 LAB — PREPARE RBC (CROSSMATCH)

## 2016-09-09 LAB — POC OCCULT BLOOD, ED: FECAL OCCULT BLD: NEGATIVE

## 2016-09-09 LAB — TROPONIN I: Troponin I: <0.03 ng/mL (ref <0.03)

## 2016-09-09 LAB — PROTIME-INR
INR: 1.2
Prothrombin Time: 15.3 seconds — ABNORMAL HIGH (ref 11.4–15.2)

## 2016-09-09 LAB — LIPASE, BLOOD: LIPASE: 108 U/L — AB (ref 11–51)

## 2016-09-09 LAB — POCT HEMOGLOBIN: HEMOGLOBIN: 6.6 g/dL — AB (ref 14.1–18.1)

## 2016-09-09 LAB — ACETAMINOPHEN LEVEL

## 2016-09-09 LAB — ETHANOL

## 2016-09-09 MED ORDER — ACETAMINOPHEN 325 MG PO TABS: 650.0000 mg | ORAL_TABLET | Freq: Four times a day (QID) | ORAL | Status: DC | PRN

## 2016-09-09 MED ORDER — ALLOPURINOL 100 MG PO TABS
100.0000 mg | ORAL_TABLET | Freq: Every day | ORAL | Status: DC
Start: 2016-09-10 — End: 2016-09-14
  Administered 2016-09-10 – 2016-09-14 (×4): 100 mg via ORAL
  Filled 2016-09-09 (×4): qty 1

## 2016-09-09 MED ORDER — ACETAMINOPHEN 650 MG RE SUPP: 650.0000 mg | Freq: Four times a day (QID) | RECTAL | Status: DC | PRN

## 2016-09-09 MED ORDER — ONDANSETRON HCL 4 MG PO TABS
4.0000 mg | ORAL_TABLET | Freq: Four times a day (QID) | ORAL | Status: DC | PRN
  Administered 2016-09-10: 4 mg via ORAL
  Filled 2016-09-09: qty 1

## 2016-09-09 MED ORDER — FOLIC ACID 1 MG PO TABS
1.0000 mg | ORAL_TABLET | Freq: Every day | ORAL | Status: DC
  Administered 2016-09-10 – 2016-09-14 (×4): 1 mg via ORAL
  Filled 2016-09-09 (×4): qty 1

## 2016-09-09 MED ORDER — SODIUM CHLORIDE 0.9 % IV SOLN
10.0000 mL/h | Freq: Once | INTRAVENOUS | Status: AC
  Administered 2016-09-09: 10 mL/h via INTRAVENOUS

## 2016-09-09 MED ORDER — POTASSIUM CHLORIDE 10 MEQ/100ML IV SOLN
10.0000 meq | Freq: Once | INTRAVENOUS | Status: AC
  Administered 2016-09-09: 10 meq via INTRAVENOUS
  Filled 2016-09-09: qty 100

## 2016-09-09 MED ORDER — PROPRANOLOL HCL 20 MG PO TABS
20.0000 mg | ORAL_TABLET | Freq: Two times a day (BID) | ORAL | Status: DC
  Administered 2016-09-10 – 2016-09-14 (×7): 20 mg via ORAL
  Filled 2016-09-09 (×11): qty 1

## 2016-09-09 MED ORDER — POTASSIUM CHLORIDE CRYS ER 20 MEQ PO TBCR
40.0000 meq | EXTENDED_RELEASE_TABLET | Freq: Once | ORAL | Status: AC
  Administered 2016-09-09: 40 meq via ORAL
  Filled 2016-09-09: qty 2

## 2016-09-09 MED ORDER — PANTOPRAZOLE SODIUM 40 MG PO TBEC
40.0000 mg | DELAYED_RELEASE_TABLET | Freq: Every day | ORAL | Status: DC
  Administered 2016-09-10 – 2016-09-14 (×4): 40 mg via ORAL
  Filled 2016-09-09 (×4): qty 1

## 2016-09-09 MED ORDER — DEXTROSE-NACL 5-0.9 % IV SOLN
INTRAVENOUS | Status: AC
  Administered 2016-09-10: 04:00:00 via INTRAVENOUS

## 2016-09-09 MED ORDER — SODIUM CHLORIDE 0.9% FLUSH
3.0000 mL | Freq: Two times a day (BID) | INTRAVENOUS | Status: DC
  Administered 2016-09-10 – 2016-09-14 (×9): 3 mL via INTRAVENOUS

## 2016-09-09 MED ORDER — ONDANSETRON HCL 4 MG/2ML IJ SOLN: 4.0000 mg | Freq: Four times a day (QID) | INTRAMUSCULAR | Status: DC | PRN

## 2016-09-09 NOTE — ED Provider Notes (Signed)
[facility] DEPT Provider Note   CSN: [id number] Arrival date & time: 09/09/16  1652     History   Chief Complaint Chief Complaint  Patient presents with  . Fatigue    HPI William [name] is a 55 y.o. male.  HPI Patient is referred from family practice for symptomatic anemia with anorexia and weight loss. Patient reports his symptoms have been much worse for about a week now. He reports he has very little appetite. Sometimes they does try to eat he may vomit. Patient does have a history of alcohol use but reports that his last alcohol consumption was greater than several months ago. He denies bloody emesis or black tarry stool. He has had generalized weakness. He denies fevers or chills. Past Medical History:  Diagnosis Date  . Anemia   . Chronic kidney disease   . Cirrhosis of liver ([place])   . COMPRESSION FRACTURE, LUMBAR VERTEBRAE 07/29/2010   Qualifier: Diagnosis of  By: Anabel Bene    . Hypertension   . Prostate cancer [facility])     Patient Active Problem List   Diagnosis Date Noted  . Diarrhea   . Colon wall thickening   . Anemia due to bone marrow failure ([place])   . Prostate cancer ([place])   . Malnutrition of moderate degree 09/10/2016  . Unintentional weight loss   . Dehydration   . Hypomagnesemia   . Hypokalemia   . Hypocalcemia   . Vomiting, persistent, in adult   . Intractable hiccups   . Decreased appetite 09/09/2016  . Symptomatic anemia 09/09/2016  . Chronic pain 02/08/2016  . Normocytic anemia 05/29/2015  . History of esophageal varices 08/20/2012  . Shoulder pain, right 06/27/2012  . Gout 06/27/2012  . MGUS (monoclonal gammopathy of unknown significance) 06/15/2012  . Hypertension 02/28/2012  . Cervical pain (neck) 01/16/2012  . Alcohol abuse 01/16/2012  . Chronic kidney disease (CKD), stage III (moderate) 12/13/2011  . Erectile dysfunction 12/13/2011  . Back pain 08/11/2011  . Shoulder pain, left 02/07/2011  . Knee pain 02/07/2011  .  Cirrhosis ([place]) 08/04/2010  . 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 ACHILLES TENDON REPAIR         Home Medications    Prior to Admission medications   Medication Sig Start Date End Date Taking? Authorizing Provider  allopurinol (ZYLOPRIM) 100 MG tablet TAKE ONE TABLET BY MOUTH ONCE DAILY 11/26/15  Yes Vivi Barrack, MD  baclofen (LIORESAL) 10 MG tablet TAKE ONE TABLET BY MOUTH THREE TIMES DAILY AS NEEDED FOR MUSCLE SPASMS 08/09/16  Yes Vivi Barrack, MD  colchicine (COLCRYS) 0.6 MG tablet Take 0.5 tablets (0.3 mg total) by mouth daily. 02/19/16  Yes Vivi Barrack, MD  diclofenac sodium (VOLTAREN) 1 % GEL Apply 2 g topically 4 (four) times daily. 01/06/15  Yes Vivi Barrack, MD  folic acid (FOLVITE) 1 MG tablet Take 1 tablet (1 mg total) by mouth daily. 05/29/15  Yes Vivi Barrack, MD  gabapentin (NEURONTIN) 100 MG capsule Take 1 capsule (100 mg total) by mouth 3 (three) times daily. 02/08/16  Yes Vivi Barrack, MD  IRON PO Take 1 tablet by mouth daily.   Yes Historical Provider, MD  omeprazole (PRILOSEC) 40 MG capsule TAKE ONE CAPSULE BY MOUTH ONCE DAILY 08/09/16  Yes Vivi Barrack, MD  propranolol (INDERAL) 40 MG tablet TAKE ONE TABLET BY MOUTH TWICE DAILY 08/09/16  Yes Algis Greenhouse  Jerline Pain, MD  spironolactone (ALDACTONE) 25 MG tablet Take 1 tablet (25 mg total) by mouth daily. 02/26/15  Yes Vivi Barrack, MD  traMADol (ULTRAM) 50 MG tablet Take 1 tablet (50 mg total) by mouth every 8 (eight) hours as needed. Patient not taking: Reported on 09/09/2016 05/29/15   Vivi Barrack, MD    Family History Family History  Problem Relation Age of Onset  . Kidney disease Mother   . Arthritis Mother   . Hypertension Father     Social History Social History  Substance Use Topics  . Smoking status: Never Smoker  . Smokeless tobacco: Never Used  . Alcohol use Yes     Comment: 1 pint per week     Allergies   Review of patient's  allergies indicates no known allergies.   Review of Systems Review of Systems 10 Systems reviewed and are negative for acute change except as noted in the HPI.   Physical Exam Updated Vital Signs BP 113/79 (BP Location: Left Arm)   Pulse 86   Temp 98.3 F (36.8 C) (Oral)   Resp 19   Ht 5' 11"  (1.803 m)   Wt 175 lb 8 oz (79.6 kg)   SpO2 100%   BMI 24.48 kg/m   Physical Exam  Constitutional: He is oriented to person, place, and time. He appears well-developed and well-nourished.  Patient is visibly pale but nontoxic and alert with no respiratory distress.  HENT:  Head: Normocephalic and atraumatic.  Mouth/Throat: Oropharynx is clear and moist.  Eyes: Conjunctivae and EOM are normal. Pupils are equal, round, and reactive to light.  Neck: Neck supple.  Cardiovascular: Regular rhythm.   No murmur heard. Borderline tachycardia.  Pulmonary/Chest: Effort normal and breath sounds normal. No respiratory distress.  Abdominal: Soft. He exhibits no distension. There is no tenderness.  No ascites.  Musculoskeletal: He exhibits no edema, tenderness or deformity.  Neurological: He is alert and oriented to person, place, and time. No cranial nerve deficit. He exhibits normal muscle tone. Coordination normal.  Skin: Skin is warm and dry. There is pallor.  Psychiatric: He has a normal mood and affect.  Nursing note and vitals reviewed.    ED Treatments / Results  Labs (all labs ordered are listed, but only abnormal results are displayed) Labs Reviewed  COMPREHENSIVE METABOLIC PANEL - Abnormal; Notable for the following:       Result Value   Sodium 128 (*)    Potassium 2.7 (*)    Chloride 89 (*)    CO2 21 (*)    Glucose, Bld 103 (*)    Creatinine, Ser 1.47 (*)    Calcium 6.2 (*)    Albumin 3.1 (*)    AST 144 (*)    Alkaline Phosphatase 138 (*)    Total Bilirubin 1.9 (*)    GFR calc non Af Amer 51 (*)    GFR calc Af Amer 59 (*)    Anion gap 18 (*)    All other components  within normal limits  LIPASE, BLOOD - Abnormal; Notable for the following:    Lipase 108 (*)    All other components within normal limits  ACETAMINOPHEN LEVEL - Abnormal; Notable for the following:    Acetaminophen (Tylenol), Serum <10 (*)    All other components within normal limits  CBC WITH DIFFERENTIAL/PLATELET - Abnormal; Notable for the following:    WBC 1.4 (*)    RBC 1.74 (*)    Hemoglobin 5.7 (*)  HCT 17.5 (*)    MCV 100.6 (*)    RDW 17.4 (*)    Platelets 89 (*)    Neutro Abs 0.9 (*)    Lymphs Abs 0.3 (*)    All other components within normal limits  PROTIME-INR - Abnormal; Notable for the following:    Prothrombin Time 15.3 (*)    All other components within normal limits  CALCIUM, IONIZED - Abnormal; Notable for the following:    Calcium, Ionized, Serum 3.0 (*)    All other components within normal limits  COMPREHENSIVE METABOLIC PANEL - Abnormal; Notable for the following:    Sodium 129 (*)    Potassium 3.3 (*)    Chloride 92 (*)    Glucose, Bld 107 (*)    Creatinine, Ser 1.48 (*)    Calcium 6.1 (*)    Total Protein 6.1 (*)    Albumin 2.8 (*)    AST 125 (*)    Total Bilirubin 3.1 (*)    GFR calc non Af Amer 51 (*)    GFR calc Af Amer 59 (*)    All other components within normal limits  CBC - Abnormal; Notable for the following:    WBC 1.5 (*)    RBC 2.27 (*)    Hemoglobin 7.3 (*)    HCT 21.7 (*)    RDW 18.9 (*)    Platelets 85 (*)    All other components within normal limits  URINALYSIS, ROUTINE W REFLEX MICROSCOPIC (NOT AT [facility]) - Abnormal; Notable for the following:    Color, Urine AMBER (*)    Bilirubin Urine SMALL (*)    Ketones, ur 15 (*)    Protein, ur 30 (*)    All other components within normal limits  FOLATE RBC - Abnormal; Notable for the following:    Hematocrit 20.8 (*)    All other components within normal limits  FERRITIN - Abnormal; Notable for the following:    Ferritin 2,754 (*)    All other components within normal limits    RETICULOCYTES - Abnormal; Notable for the following:    RBC. 2.23 (*)    All other components within normal limits  MULTIPLE MYELOMA PANEL, SERUM - Abnormal; Notable for the following:    IgA 392 (*)    Total Protein ELP 5.9 (*)    Alpha2 Glob SerPl Elph-Mcnc 0.3 (*)    All other components within normal limits  KAPPA/LAMBDA LIGHT CHAINS - Abnormal; Notable for the following:    Kappa free light chain 117.1 (*)    Lamda free light chains 59.2 (*)    Kappa, lamda light chain ratio 1.98 (*)    All other components within normal limits  MAGNESIUM - Abnormal; Notable for the following:    Magnesium 0.8 (*)    All other components within normal limits  URINE MICROSCOPIC-ADD ON - Abnormal; Notable for the following:    Squamous Epithelial / LPF 0-5 (*)    Bacteria, UA RARE (*)    Casts HYALINE CASTS (*)    All other components within normal limits  PHOSPHORUS - Abnormal; Notable for the following:    Phosphorus <1.0 (*)    All other components within normal limits  VITAMIN D 25 HYDROXY (VIT D DEFICIENCY, FRACTURES) - Abnormal; Notable for the following:    Vit D, 25-Hydroxy 5.3 (*)    All other components within normal limits  BASIC METABOLIC PANEL - Abnormal; Notable for the following:    Sodium 131 (*)  Potassium 3.3 (*)    Chloride 96 (*)    Glucose, Bld 112 (*)    Calcium 6.6 (*)    All other components within normal limits  CBC - Abnormal; Notable for the following:    WBC 1.7 (*)    RBC 2.41 (*)    Hemoglobin 7.5 (*)    HCT 23.0 (*)    RDW 19.8 (*)    Platelets 93 (*)    All other components within normal limits  CBC - Abnormal; Notable for the following:    WBC 1.6 (*)    RBC 2.13 (*)    Hemoglobin 6.7 (*)    HCT 20.4 (*)    RDW 19.6 (*)    Platelets 84 (*)    All other components within normal limits  BASIC METABOLIC PANEL - Abnormal; Notable for the following:    Sodium 132 (*)    Potassium 3.2 (*)    Chloride 100 (*)    Calcium 6.8 (*)    All other  components within normal limits  PHOSPHORUS - Abnormal; Notable for the following:    Phosphorus 1.1 (*)    All other components within normal limits  MAGNESIUM - Abnormal; Notable for the following:    Magnesium 1.3 (*)    All other components within normal limits  HEMOGLOBIN AND HEMATOCRIT, BLOOD - Abnormal; Notable for the following:    Hemoglobin 9.0 (*)    HCT 27.4 (*)    All other components within normal limits  HAPTOGLOBIN - Abnormal; Notable for the following:    Haptoglobin <10 (*)    All other components within normal limits  CBC - Abnormal; Notable for the following:    WBC 1.4 (*)    RBC 2.45 (*)    Hemoglobin 7.5 (*)    HCT 22.8 (*)    RDW 21.9 (*)    Platelets 86 (*)    All other components within normal limits  BASIC METABOLIC PANEL - Abnormal; Notable for the following:    Sodium 134 (*)    Calcium 7.2 (*)    All other components within normal limits  CBC - Abnormal; Notable for the following:    WBC 1.8 (*)    RBC 2.63 (*)    Hemoglobin 8.1 (*)    HCT 25.1 (*)    RDW 21.5 (*)    Platelets 86 (*)    All other components within normal limits  MAGNESIUM - Abnormal; Notable for the following:    Magnesium 1.2 (*)    All other components within normal limits  PHOSPHORUS - Abnormal; Notable for the following:    Phosphorus 1.5 (*)    All other components within normal limits  GIARDIA/CRYPTOSPORIDIUM EIA  GASTROINTESTINAL PANEL BY PCR, STOOL (REPLACES STOOL CULTURE)  ETHANOL  TROPONIN I  SALICYLATE LEVEL  TSH  VITAMIN B12  SAVE SMEAR  HIV ANTIBODY (ROUTINE TESTING)  AMMONIA  MAGNESIUM  HEPATITIS C ANTIBODY (REFLEX)  HCV COMMENT:  PATHOLOGIST SMEAR REVIEW  PSA  AMYLASE  METHYLMALONIC ACID, SERUM  FECAL LACTOFERRIN, QUANT  BASIC METABOLIC PANEL  CBC  PHOSPHORUS  MAGNESIUM  CORTISOL-AM, BLOOD  POC OCCULT BLOOD, ED  TYPE AND SCREEN  PREPARE RBC (CROSSMATCH)  PREPARE RBC (CROSSMATCH)    EKG  EKG Interpretation  Date/Time:  Friday September 09 2016 19:49:49 EDT Ventricular Rate:  106 PR Interval:    QRS Duration: 68 QT Interval:  365 QTC Calculation: 485 R Axis:   19 Text Interpretation:  Sinus tachycardia Prolonged PR interval Low voltage, precordial leads Probable anteroseptal infarct, old Borderline T abnormalities, inferior and lateral leads, new since May 2011 Confirmed by [facility]  MD, [place] [phone]) on 09/12/2016 12:34:16 AM       Radiology No results found.  Procedures Procedures (including critical care time)  Medications Ordered in ED Medications  sodium chloride flush (NS) 0.9 % injection 3 mL ( Intravenous MAR Unhold 09/13/16 1113)  dextrose 5 %-0.9 % sodium chloride infusion ( Intravenous New Bag/Given 09/10/16 0336)  acetaminophen (TYLENOL) tablet 650 mg ( Oral MAR Unhold 09/13/16 1113)    Or  acetaminophen (TYLENOL) suppository 650 mg ( Rectal MAR Unhold 09/13/16 1113)  ondansetron (ZOFRAN) tablet 4 mg ( Oral MAR Unhold 09/13/16 1113)    Or  ondansetron (ZOFRAN) injection 4 mg ( Intravenous MAR Unhold 09/13/16 1113)  allopurinol (ZYLOPRIM) tablet 100 mg ( Oral MAR Unhold 7/59/16 3846)  folic acid (FOLVITE) tablet 1 mg ( Oral MAR Unhold 09/13/16 1113)  pantoprazole (PROTONIX) EC tablet 40 mg ( Oral MAR Unhold 09/13/16 1113)  propranolol (INDERAL) tablet 20 mg ( Oral MAR Unhold 09/13/16 1113)  multivitamin with minerals tablet 1 tablet ( Oral MAR Unhold 09/13/16 1113)  feeding supplement (BOOST / RESOURCE BREEZE) liquid 1 Container (1 Container Oral Given 09/13/16 1218)  metoCLOPramide (REGLAN) tablet 5 mg (5 mg Oral Given 09/13/16 1658)  thiamine (VITAMIN B-1) tablet 200 mg ( Oral MAR Unhold 09/13/16 1113)  iopamidol (ISOVUE-300) 61 % injection (not administered)  spironolactone (ALDACTONE) tablet 25 mg (25 mg Oral Given 09/13/16 1218)  magnesium sulfate IVPB 2 g 50 mL (not administered)  sodium phosphate 10 mmol in dextrose 5 % 250 mL infusion (not administered)  0.9 %  sodium chloride infusion (10 mL/hr  Intravenous New Bag/Given 09/09/16 2142)  potassium chloride SA (K-DUR,KLOR-CON) CR tablet 40 mEq (40 mEq Oral Given 09/09/16 2110)  potassium chloride 10 mEq in 100 mL IVPB (0 mEq Intravenous Stopped 09/09/16 2218)  magnesium sulfate IVPB 4 g 100 mL (4 g Intravenous Given 09/10/16 0818)  iopamidol (ISOVUE-300) 61 % injection (100 mLs  Contrast Given 09/10/16 2212)  phosphorus (K PHOS NEUTRAL) tablet 500 mg (500 mg Oral Given 09/11/16 1017)  phosphorus (K PHOS NEUTRAL) tablet 1,000 mg (1,000 mg Oral Given 09/12/16 1732)  0.9 %  sodium chloride infusion ( Intravenous New Bag/Given 09/12/16 0558)  magnesium sulfate IVPB 2 g 50 mL (2 g Intravenous Given 09/12/16 1041)  potassium chloride SA (K-DUR,KLOR-CON) CR tablet 40 mEq (40 mEq Oral Given 09/12/16 2100)     Initial Impression / Assessment and Plan / ED Course  I have reviewed the triage vital signs and the nursing notes.  Pertinent labs & imaging results that were available during my care of the patient were reviewed by me and considered in my medical decision making (see chart for details).  Clinical Course     Final Clinical Impressions(s) / ED Diagnoses   Final diagnoses:  Unintentional weight loss  Cirrhosis ([place])  Weight loss    New Prescriptions Current Discharge Medication List       Charlesetta Shanks, MD 09/13/16 2009

## 2016-09-09 NOTE — ED Notes (Signed)
Lab called with critical potassium level of 2.7 and Calcium 6.1. Dr Johnney Killian notified.

## 2016-09-09 NOTE — ED Notes (Signed)
Pt ambulated with this RN to restroom and back without difficulty. Upon getting back in bed pt became lightheaded and when placed back on the monitor Pt's HR was 160 and BP 78/56. Dr Johnney Killian notified and in the room. Ordered to give pt 250 ml bolus of NS and to watch patient.

## 2016-09-09 NOTE — Assessment & Plan Note (Signed)
POC hgb 5.6 today with 6.6 on recheck. Patient also with profound tachycardia. He is otherwise stable without chest pain or shortness of breath. No dizziness or syncope. Will be sending to the ED via ambulance. In the ED, will need blood transfusion. Unclear source of patient's anemia, will likely need GI work up (has had positive FOBT in the past).

## 2016-09-09 NOTE — ED Notes (Signed)
Pt signed consent, placed at bedside

## 2016-09-09 NOTE — H&P (Signed)
[facility] Admission History and Physical Service Pager: [phone]  Patient name: William [name] Medical record number: [id number] Date of birth: Jul 29, 1961 Age: 55 y.o. Gender: male  Primary Care Provider: Dimas Chyle, MD Consultants: None Code Status: FULL  Chief Complaint: Decreased appetite  Assessment and Plan: William [name] is a 55 y.o. male presenting with decreased appetite found to have symptomatic anemia. PMH is significant for chronic anemia, alcoholic cirrhosis, esophageal varices, MGUS, HTN, CKD3.  Anorexia, Unintentional Weight Loss: He was 78.835kg on 2/13. He is now 73.5kg, which is a weight loss of 5.3kg. Albumin 3.1. He is at risk for malignancy with his history of alcoholic cirrhosis. He has had screening for HIV and hepatitis C done in 2011. He had a [place] RUQ 6 months ago on 3/10 which demonstrated gallstones and echogenic liver with nodular contour.  -Check TSH -Check US abdomen -Check CXR -Rescreen for HIV -Dietician consult  Symptomatic Acute on Chronic Anemia: Hgb 5.7 with MCV 100.6 on admission. Baseline Hgb around 9-10. FOBT negative and denies hematemesis, melena, hematochezia. He had a EGD for anemia by Dr. Deatra Ina 05/26/2010 which demonstrated grade II varices in the distal esophagus. Colonoscopy on 05/28/2010 demonstrated 2m sessile polyp in the descending colon and diverticula in the sigmoid colon. Pathology from the polyp suggested most likely ingested material. BUN 16, which makes upper GI bleed less likely. Also on differential is malignancy, hypothyroidism, liver disease, vitamin B12 or folate deficiency. -Transfuse 2 units pRBC with goal Hgb >7 -Check ferritin -Check vitamin B12, folate -Check retic -Consider GI consult either inpatient or outpatient depending on how he responds to the blood transfusion and his hemodynamic status.  Hyponatremia: Na 128 on admission. Baseline sodium around 131. He probably has some component  of volume depletion due to poor PO intake. His baseline hyponatremia is likely related to his cirrhosis. -Recheck sodium in AM  Hypokalemia: K 2.7 on admission. He reports emesis for the past month every other day. He also reports loose stools. Probably related to increased GI losses. Received PO KCl 421m in ED. -Recheck potassium in AM and replete prn -Check magnesium level -Consider restarting spironolactone if BP tolerates.  Metabolic Acidosis with Increased Anion Gap: CO2 21 and Anion gap 18. Likely related to poor PO intake (starvation ketoacidosis). Salicylate lvl neg. EtOH neg and denies recent alcohol use.  -D5NS @ 10071mr for 12 hours  Hypocalcemia: Corrected calcium 6.5 -Recheck in AM -Check magnesium level -Consider further workup with PTH  Pancytopenia: WBC 1.4, Platelet 89 on admission. Baseline WBC 2.2 and platelet around 100. His cirrhosis is probably contributing to his thrombocytopenia. Myelodysplastic syndromes are on the differential. -Smear saved -Checking for HIV and vitamin deficiencies as above  Alcoholic cirrhosis with history of esophageal varices: Child Class A. He is on propranolol for variceal hemorrhage prophylaxis. Also on spironolactone. Alk phos 138 (lower than previous 161 on 2/13), AST 144 (lower than previous 221 on 2/13), ALT 26, T bili 1.9 (lower than previous 2.5 on 2/13). Prothrombin time 15.3 (INR 1.2). -Continue propranolol at lower dose 80m46mD given BP low normal. -Consider restarting spironolactone if BP tolerates.  MGUS: History of MGUS noted. Small risk of progressing to more advanced premalignant stage and to a malignant plasma cell dyscrasia or lymphoproliferative disorder. He has some red flags including fatigue/generalized weakness, unintentional weight loss, acute on chronic anemia, elevated creatinine. No hypercalcemia or increased total protein. -Blood smear -SPEP and IFE, kappa/lambda light chains  HTN: BP 102/76-128/88 -Continue  lower dose of propranolol -Holding spironolactone  AKI on CKD3: Creatinine 1.47. Baseline creatinine around 1.06. -Check UA -Giving IV fluids as above  Gout: Continue home allopurinol 171m daily. Hold home colchicine.  FEN/GI: clear liquids, D5NS Prophylaxis: SCDs  Disposition: pending work up  History of Present Illness:  William PRIOLAis a 55year old man with history of chronic anemia, alcoholic cirrhosis, esophageal varices, MGUS, HTN, CKD3 presenting with decreased appetite. He is accompanied by his daughter and son in law. He reports anorexia for the past week and a half. He has not been taking his medications during this period of time. He has had poor appetite for the past 6 months. He reports he has lost 15 lbs over the last month and a half. He has generalized weakness, fatigue, occasional chills. He is dyspneic on exertion and can only walk a few yards before he has to stop and rest. He has been having nausea and vomits every other day for the past month. The emesis is nonbillious and nonbloody. He has acid reflux and heartburn but this is controlled by medications. His last BM was yesterday. He has had loose stools for the past 2 weeks with up to 2 BM per day. Denies steatorrhea, hematochezia or melena. He denies fevers, cough, dyspnea, chest pain, pruritis, confusion, hallucinations, dysuria, hematuria, focal weakness, paresthesias, lightheadedness, headaches. Denies NSAID or blood thinners. Denies trauma. Last had a blood transfusion 5 years ago which he reports was due to his cirrhosis. He had an EGD and colonoscopy at that time which he reports were normal.   Review Of Systems: Per HPI. Otherwise the remainder of the systems were negative.  Patient Active Problem List   Diagnosis Date Noted  . Decreased appetite 09/09/2016  . Chronic pain 02/08/2016  . Normocytic anemia 05/29/2015  . History of esophageal varices 08/20/2012  . Shoulder pain, right 06/27/2012  . Gout  06/27/2012  . MGUS (monoclonal gammopathy of unknown significance) 06/15/2012  . Hypertension 02/28/2012  . Cervical pain (neck) 01/16/2012  . Alcohol abuse 01/16/2012  . Chronic kidney disease (CKD), stage III (moderate) 12/13/2011  . Erectile dysfunction 12/13/2011  . Back pain 08/11/2011  . Shoulder pain, left 02/07/2011  . Knee pain 02/07/2011  . [place] 02[place]    Past Medical History: Past Medical History:  Diagnosis Date  . Anemia   . Chronic kidney disease   . [address] [place] [address]   . COMPRESSION FRACTURE, LUMBAR VERTEBRAE 07/29/2010   Qualifier: Diagnosis of  By: BAnabel Bene   . Hypertension     Past Surgical History: Past Surgical History:  Procedure Laterality Date  . ACHILLES TENDON REPAIR      Social History: Social History  Substance Use Topics  . Smoking status: Never Smoker  . Smokeless tobacco: Never Used  . Alcohol use Yes     Comment: 1 pint per week   Additional social history: Quit alcohol 5 years ago. Denies illicit drugs. On disability. Previously was a sTherapist, art Please also refer to relevant sections of EMR.  Family History: Family History  Problem Relation Age of Onset  . Kidney disease Mother   . Arthritis Mother   . Hypertension Father    Denies family history of cancer, liver disease, bleeding/clotting disorders  Allergies and Medications: No Known Allergies No current facility-administered medications on file prior to encounter.    Current Outpatient Prescriptions on File Prior to Encounter  Medication Sig Dispense  Refill  . allopurinol (ZYLOPRIM) 100 MG tablet TAKE ONE TABLET BY MOUTH ONCE DAILY 30 tablet 5  . baclofen (LIORESAL) 10 MG tablet TAKE ONE TABLET BY MOUTH THREE TIMES DAILY AS NEEDED FOR MUSCLE SPASMS 30 tablet 3  . colchicine (COLCRYS) 0.6 MG tablet Take 0.5 tablets (0.3 mg total) by mouth daily. 30 tablet 3  . diclofenac sodium (VOLTAREN) 1  % GEL Apply 2 g topically 4 (four) times daily. 844 g 5  . folic acid (FOLVITE) 1 MG tablet Take 1 tablet (1 mg total) by mouth daily. 90 tablet 3  . gabapentin (NEURONTIN) 100 MG capsule Take 1 capsule (100 mg total) by mouth 3 (three) times daily. 90 capsule 0  . omeprazole (PRILOSEC) 40 MG capsule TAKE ONE CAPSULE BY MOUTH ONCE DAILY 30 capsule 11  . propranolol (INDERAL) 40 MG tablet TAKE ONE TABLET BY MOUTH TWICE DAILY 60 tablet 11  . spironolactone (ALDACTONE) 25 MG tablet Take 1 tablet (25 mg total) by mouth daily. 30 tablet 5  . traMADol (ULTRAM) 50 MG tablet Take 1 tablet (50 mg total) by mouth every 8 (eight) hours as needed. (Patient not taking: Reported on 09/09/2016) 30 tablet 2    Objective: BP 118/90   Pulse 109   Temp 99.1 F (37.3 C) (Oral)   Resp 22   SpO2 100%  Exam: General Apperance: NAD Head: Normocephalic, atraumatic Eyes: PERRL, EOMI, anicteric sclera, pale conjunctiva Ears: Normal external ear canal Nose: Nares normal, septum midline, mucosa normal Throat: Lips, mucosa and tongue normal  Neck: Supple, trachea midline, no lymphadenopathy Back: No tenderness or bony abnormality  Lungs: Clear to auscultation bilaterally. No wheezes, rhonchi or rales. Chest Wall: Nontender, no deformity Heart: Regular rate and rhythm, no murmur/rub/gallop Abdomen: Soft, nontender, nondistended, no rebound/guarding Extremities: Normal, atraumatic, warm and well perfused, no edema Pulses: 2+ throughout Skin: No rashes or lesions Neurologic: Alert and oriented x 3. CNII-XII intact. Normal strength and sensation  Labs and Imaging:  CBC Lab 09/09/16 1937  WBC 1.4*  HGB 5.7*  HCT 17.5*  PLT 89*   Na 128 K 2.7 CO2 21 BUN 16  Creatinine 1.47 Calcium 6.2 Anion gap 18 Alk phos 138 Albumin 3.1 Lipase 108 AST 144 ALT 26 T bili 1.9  Troponin <0.03  Alcohol <5 Acetaminophen <17 Salicylate <4  FOBT negative  EKG: Sinus tachycardia. Prolonged PR. No previous EKG  for comparison.  Milagros Loll, MD 09/09/2016, 9:18 PM Internal Medicine PGY-3 covering for [place] Intern pager: [phone], text pages welcome

## 2016-09-09 NOTE — ED Notes (Signed)
Pt transported to radiology.

## 2016-09-09 NOTE — ED Notes (Signed)
Dr. Randell Patient requested that 3rd unit of blood be put on hold and to only tranfuse 2 units at this time.

## 2016-09-09 NOTE — ED Notes (Signed)
Pt's HR now 107 sinus tach and BP 123/79. Pt alert and oriented  X4. Pt no longer light headed

## 2016-09-09 NOTE — Assessment & Plan Note (Signed)
Unclear etiology. Broad differential including malignancy, gastroparesis, hypothyroidism, etc. Patient will be going to the ED for his critical low hgb. Would likely benefit from basic labs including TSH in addition to imaging such as CT scan. If patient is admitted, would likely need GI consult for possible endoscopic evaluation.

## 2016-09-09 NOTE — Patient Instructions (Signed)
You will be going to the ED.  You will need a blood transfusion. You will also likely need a CT scan of your abdomen and an evaluation by the GI doctors.

## 2016-09-09 NOTE — Progress Notes (Signed)
   Subjective:  William [name] is a 55 y.o. male who presents to the [facility] today with a chief complaint of decreased appetite.   HPI:  Decreased Appetite.  Patient with about 6 months of decreased appetite and weight loss. Has not been able to eat anything for about the last week. He has lost about 15-18 pounds over the past 6 months. Currently he feels very week. Does not feel hungry. He has tried to eat, but usually throws up after eating a small amount. He has been able keep down fluis and he has been drinking about 5 bottles of water per day. No fevers. No night sweats. No diarrhea. No other recent illnesses.   ROS: Per HPI  PMH: Smoking history reviewed.    Objective:  Physical Exam: BP 126/79   Pulse (!) 135   Temp 98.5 F (36.9 C) (Oral)   Ht 5\' 11"  (1.803 m)   Wt 162 lb (73.5 kg)   BMI 22.59 kg/m   Orthostatic VS for the past 24 hrs:  BP- Lying Pulse- Lying BP- Sitting Pulse- Sitting BP- Standing at 0 minutes Pulse- Standing at 0 minutes  09/09/16 1520 121/81 128 121/85 134 126/81 141   Gen: NAD, resting comfortably, frequent hiccups noted.  HEENT: Conjunctiva pale. MMM CV: Tachycardic. Regular. No murmurs appreciated Pulm: NWOB, CTAB with no crackles, wheezes, or rhonchi GI: Normal bowel sounds present. Soft, Nontender, Nondistended. MSK: no edema, cyanosis, or clubbing noted Skin: warm, dry Neuro: grossly normal, moves all extremities Psych: Normal affect and thought content  Results for orders placed or performed in visit on 09/09/16 (from the past 72 hour(s))  Hemoglobin     Status: Abnormal   Collection Time: 09/09/16  3:21 PM  Result Value Ref Range   Hemoglobin 6.6 (A) 14.1 - 18.1 g/dL    Assessment/Plan:  Decreased appetite Unclear etiology. Broad differential including malignancy, gastroparesis, hypothyroidism, etc. Patient will be going to the ED for his critical low hgb. Would likely benefit from basic labs including TSH in addition to imaging such as  CT scan. If patient is admitted, would likely need GI consult for possible endoscopic evaluation.   Normocytic anemia POC hgb 5.6 today with 6.6 on recheck. Patient also with profound tachycardia. He is otherwise stable without chest pain or shortness of breath. No dizziness or syncope. Will be sending to the ED via ambulance. In the ED, will need blood transfusion. Unclear source of patient's anemia, will likely need GI work up (has had positive FOBT in the past).    Algis Greenhouse. Jerline Pain, [place] Resident PGY-3 09/09/2016 3:51 PM

## 2016-09-09 NOTE — ED Notes (Signed)
This RN in room with Dr Johnney Killian for rectal exam

## 2016-09-09 NOTE — ED Notes (Signed)
Hospitalist at bedside 

## 2016-09-09 NOTE — ED Triage Notes (Signed)
Pt to ER BIB [facility] from family practice. Was being seen for weakness x3 days. Hgb noted to be 5.9 per EMS per facility. Pt is a/o x4. Pt is pale on arrival but a/o x4, BP 128/94,  HR 120. Denies blood in stool. Denies anticoagulants.

## 2016-09-10 ENCOUNTER — Encounter (HOSPITAL_COMMUNITY): Payer: Self-pay | Admitting: *Deleted

## 2016-09-10 ENCOUNTER — Observation Stay (HOSPITAL_COMMUNITY): Payer: Commercial Managed Care - HMO

## 2016-09-10 DIAGNOSIS — E440 Moderate protein-calorie malnutrition: Secondary | ICD-10-CM | POA: Diagnosis not present

## 2016-09-10 DIAGNOSIS — E860 Dehydration: Secondary | ICD-10-CM | POA: Diagnosis not present

## 2016-09-10 DIAGNOSIS — R1110 Vomiting, unspecified: Secondary | ICD-10-CM

## 2016-09-10 DIAGNOSIS — R634 Abnormal weight loss: Secondary | ICD-10-CM

## 2016-09-10 DIAGNOSIS — R066 Hiccough: Secondary | ICD-10-CM

## 2016-09-10 DIAGNOSIS — D649 Anemia, unspecified: Secondary | ICD-10-CM

## 2016-09-10 DIAGNOSIS — K7030 Alcoholic cirrhosis of liver without ascites: Secondary | ICD-10-CM | POA: Diagnosis not present

## 2016-09-10 DIAGNOSIS — E876 Hypokalemia: Secondary | ICD-10-CM

## 2016-09-10 DIAGNOSIS — R1115 Cyclical vomiting syndrome unrelated to migraine: Secondary | ICD-10-CM

## 2016-09-10 LAB — MAGNESIUM: MAGNESIUM: 0.8 mg/dL — AB (ref 1.7–2.4)

## 2016-09-10 LAB — CBC
HCT: 21.7 % — ABNORMAL LOW (ref 39.0–52.0)
Hemoglobin: 7.3 g/dL — ABNORMAL LOW (ref 13.0–17.0)
MCH: 32.2 pg (ref 26.0–34.0)
MCHC: 33.6 g/dL (ref 30.0–36.0)
MCV: 95.6 fL (ref 78.0–100.0)
PLATELETS: 85 10*3/uL — AB (ref 150–400)
RBC: 2.27 MIL/uL — ABNORMAL LOW (ref 4.22–5.81)
RDW: 18.9 % — AB (ref 11.5–15.5)
WBC: 1.5 10*3/uL — AB (ref 4.0–10.5)

## 2016-09-10 LAB — URINALYSIS, ROUTINE W REFLEX MICROSCOPIC
Glucose, UA: NEGATIVE mg/dL
Hgb urine dipstick: NEGATIVE
KETONES UR: 15 mg/dL — AB
LEUKOCYTES UA: NEGATIVE
NITRITE: NEGATIVE
PROTEIN: 30 mg/dL — AB
Specific Gravity, Urine: 1.013 (ref 1.005–1.030)
pH: 6 (ref 5.0–8.0)

## 2016-09-10 LAB — RETICULOCYTES
RBC.: 2.23 MIL/uL — ABNORMAL LOW (ref 4.22–5.81)
RETIC COUNT ABSOLUTE: 22.3 10*3/uL (ref 19.0–186.0)
Retic Ct Pct: 1 % (ref 0.4–3.1)

## 2016-09-10 LAB — URINE MICROSCOPIC-ADD ON: RBC / HPF: NONE SEEN RBC/hpf (ref 0–5)

## 2016-09-10 LAB — COMPREHENSIVE METABOLIC PANEL
ALK PHOS: 119 U/L (ref 38–126)
ALT: 22 U/L (ref 17–63)
AST: 125 U/L — AB (ref 15–41)
Albumin: 2.8 g/dL — ABNORMAL LOW (ref 3.5–5.0)
Anion gap: 15 (ref 5–15)
BUN: 17 mg/dL (ref 6–20)
CALCIUM: 6.1 mg/dL — AB (ref 8.9–10.3)
CO2: 22 mmol/L (ref 22–32)
CREATININE: 1.48 mg/dL — AB (ref 0.61–1.24)
Chloride: 92 mmol/L — ABNORMAL LOW (ref 101–111)
GFR, EST AFRICAN AMERICAN: 59 mL/min — AB (ref >60)
GFR, EST NON AFRICAN AMERICAN: 51 mL/min — AB (ref >60)
Glucose, Bld: 107 mg/dL — ABNORMAL HIGH (ref 65–99)
Potassium: 3.3 mmol/L — ABNORMAL LOW (ref 3.5–5.1)
SODIUM: 129 mmol/L — AB (ref 135–145)
TOTAL PROTEIN: 6.1 g/dL — AB (ref 6.5–8.1)
Total Bilirubin: 3.1 mg/dL — ABNORMAL HIGH (ref 0.3–1.2)

## 2016-09-10 LAB — FERRITIN: FERRITIN: 2754 ng/mL — AB (ref 24–336)

## 2016-09-10 LAB — SAVE SMEAR

## 2016-09-10 LAB — VITAMIN B12: VITAMIN B 12: 367 pg/mL (ref 180–914)

## 2016-09-10 LAB — TSH: TSH: 2.049 u[IU]/mL (ref 0.350–4.500)

## 2016-09-10 MED ORDER — MAGNESIUM SULFATE 4 GM/100ML IV SOLN
4.0000 g | Freq: Once | INTRAVENOUS | Status: AC
  Administered 2016-09-10: 4 g via INTRAVENOUS
  Filled 2016-09-10: qty 100

## 2016-09-10 MED ORDER — BOOST / RESOURCE BREEZE PO LIQD
1.0000 | Freq: Three times a day (TID) | ORAL | Status: DC
  Administered 2016-09-10 – 2016-09-14 (×8): 1 via ORAL

## 2016-09-10 MED ORDER — IOPAMIDOL (ISOVUE-300) INJECTION 61%
INTRAVENOUS | Status: AC
  Administered 2016-09-10: 100 mL
  Filled 2016-09-10: qty 100

## 2016-09-10 MED ORDER — METOCLOPRAMIDE HCL 5 MG PO TABS
5.0000 mg | ORAL_TABLET | Freq: Three times a day (TID) | ORAL | Status: DC
  Administered 2016-09-10 – 2016-09-14 (×12): 5 mg via ORAL
  Filled 2016-09-10 (×11): qty 1

## 2016-09-10 MED ORDER — IOPAMIDOL (ISOVUE-300) INJECTION 61%
INTRAVENOUS | Status: AC
  Filled 2016-09-10: qty 30

## 2016-09-10 MED ORDER — ADULT MULTIVITAMIN W/MINERALS CH
1.0000 | ORAL_TABLET | Freq: Every day | ORAL | Status: DC
  Administered 2016-09-11 – 2016-09-14 (×3): 1 via ORAL
  Filled 2016-09-10 (×3): qty 1

## 2016-09-10 MED ORDER — VITAMIN B-1 100 MG PO TABS
200.0000 mg | ORAL_TABLET | Freq: Every day | ORAL | Status: DC
  Administered 2016-09-11 – 2016-09-14 (×3): 200 mg via ORAL
  Filled 2016-09-10 (×3): qty 2

## 2016-09-10 MED ORDER — CALCIUM CARBONATE ANTACID 500 MG PO CHEW
1.0000 | CHEWABLE_TABLET | Freq: Three times a day (TID) | ORAL | Status: DC
  Administered 2016-09-10 – 2016-09-12 (×5): 200 mg via ORAL
  Filled 2016-09-10 (×5): qty 1

## 2016-09-10 NOTE — Progress Notes (Signed)
Family Medicine Teaching Service Daily Progress Note Intern Pager: [phone]  Patient name: William [name] Medical record number: [id number] Date of birth: 07-16-1961 Age: 55 y.o. Gender: male  Primary Care Provider: Dimas Chyle, MD Consultants: None Code Status: Full Code  Pt Overview and Major Events to Date:  9/15 admission for weakness with anemia, weight loss, and low appetite  Assessment and Plan: William [name] is a 55 y.o. male presenting with decreased appetite found to have symptomatic anemia. PMH is significant for chronic anemia, alcoholic cirrhosis, esophageal varices, MGUS, HTN, CKD3.  Anorexia, Unintentional Weight Loss: Etiology is still somewhat unclear. This is likely related to his cirrhosis but there may be an exacerbating factor present since he has worsened within the past few months. He does not have a known component of chronic pancreatitis although this would also be a concern. No Hx of gastritis is known. He does have cholelithiasis without inflammation but no biliary colic s/s on ROS. Interestingly he is noted to hiccup continuously today and states this is longstanding. He is up to date on monitoring for cirrhosis but hepatic malignancy is also a possible consideration that we will pursue with contrasted CT. -Rescreening for HIV -Dietician consult -Trial reglan, consider thorazine for hiccups -CT abdomen with contrast  Symptomatic Acute on Chronic Anemia: Improved to 7.3 after transfusion 2 units pRBCs. Ferritin is very high probably as an acute phase reactant. B12 is WNL although lower end, will check MMA, folate. Reticulocyte count is inappropriately low for his anemia suggesting production defect. GI tract losses are much less likely with negative FOBT and normal to macrocytic anemia. Myeloproliferative disease is possible although he remains young for a primary myelofibrosis. It is also unclear how badly his liver disease is suppressing production although his  anemia seems advanced compared to other markers such as albumin, PT/INR -Transfuse 2 units pRBC with goal Hgb >7 -Check vitamin B12, folate -Can consider GI consultation if workup remains unremarkable for new changes  Hypokalemia: K improving from 2.7 to 3.3. This was due to extensive GI losses going on for at least the past month. Continue replacement along with magnesium. I expect there will be a good response but we can also restart an aldosterone antagonist if needed to reduce further renal losses. -Recheck potassium in AM and replete prn -Consider restarting spironolactone if BP tolerates.  Metabolic Acidosis with Increased Anion Gap: Improved after fluid replacement  Hypocalcemia/hypomagnesemia: Corrected calcium 6.5. We will supplement starting with calcium carbonate. Also we will check vitamin D and consider PTH given these abnormalities. -Consider further workup with PTH -CaCO3 500mg  TID -checking vitamin D 25 hydroxy  Pancytopenia: Hgb improved s/p transfusion. Discussed in anemia as above, and also myelodysplastic syndromes are on the differential. Based on my review peripheral smear shows some rouleaux formations, some hypo/anisochromia on light microscopy along with previously noted thrombocytopenia I estimate about 90k consistent with reported number. No obvious schistocytes or frequent dysmorphic cells were very prevalent. -Checking for HIV and vitamin deficiencies as above  Alcoholic cirrhosis with history of esophageal varices: Child Class A. He is on propranolol for variceal hemorrhage prophylaxis. Also on spironolactone. Overall liver function tests are lower than values from months earlier this year. There is some mild T. Bili elevation from yesterday. -Continue propranolol at lower dose 20mg  BID given BP low normal. -Low threshold to restart spironolactone if potassium does not improve quickly with magnesium supplementation  MGUS: Initial findings showing low urine  protein suggesting against a proliferative gammopathy. However  kappa/lamda light chain assay still pending and MM panel. -SPEP and IFE, kappa/lambda light chains  HTN: BP 100-130s/60-90s -Continue lower dose of propranolol  AKI on CKD3: Creatinine 1.47. Baseline creatinine around 1.06. May be just pre-renal in the setting of hypovolemia with GI losses. He also has a past history of the hepatorenal syndrome in 2011 during acute illness with decompensation of his cirrhosis. UA is bland with small protein and no evidence of a nephritic process. -Giving IV fluids as above -Follow daily metabolic panel  Gout: Continue home allopurinol 100mg  daily. Hold home colchicine.  FEN/GI: clear liquids, D5NS Prophylaxis: SCDs  Disposition: Pending further evaluation  Subjective:  William [name] is feeling well this morning and wondering how close he is to leaving. He continues to have hiccups and   Objective: Temp:  [97.5 F (36.4 C)-99.3 F (37.4 C)] 97.8 F (36.6 C) (09/16 1605) Pulse Rate:  [80-112] 80 (09/16 1605) Resp:  [13-26] 17 (09/16 1605) BP: (100-131)/(68-95) 111/80 (09/16 1605) SpO2:  [98 %-100 %] 100 % (09/16 1605) Weight:  [162 lb 0.6 oz (73.5 kg)] 162 lb 0.6 oz (73.5 kg) (09/16 0047) Physical Exam: General: Well developed man resting comfortably in no acute distress Cardiovascular: RRR, no murmurs Respiratory: CTAB, normal WOB Abdomen: Soft, nontender, nondistended, no fluid wave or tympany, BS+ Extremities: No peripheral edema noted  Laboratory:  Recent Labs Lab 09/09/16 1521 09/09/16 1937 09/10/16 0436  WBC  --  1.4* 1.5*  HGB 6.6* 5.7* 7.3*  HCT  --  17.5* 21.7*  PLT  --  89* 85*    Recent Labs Lab 09/09/16 1937 09/10/16 0436  NA 128* 129*  K 2.7* 3.3*  CL 89* 92*  CO2 21* 22  BUN 16 17  CREATININE 1.47* 1.48*  CALCIUM 6.2* 6.1*  PROT 7.0 6.1*  BILITOT 1.9* 3.1*  ALKPHOS 138* 119  ALT 26 22  AST 144* 125*  GLUCOSE 103* 107*   Liver Function  Tests:  Recent Labs  09/09/16 1937 09/10/16 0436  AST 144* 125*  ALT 26 22  ALKPHOS 138* 119  BILITOT 1.9* 3.1*  PROT 7.0 6.1*  ALBUMIN 3.1* 2.8*    Recent Labs  09/09/16 1937  LIPASE 108*   Anemia Panel:  Recent Labs  09/10/16 0441  VITAMINB12 367  FERRITIN 2,754*  RETICCTPCT 1.0   Urinalysis:  Recent Labs  09/10/16 0203  COLORURINE AMBER*  LABSPEC 1.013  PHURINE 6.0  GLUCOSEU NEGATIVE  HGBUR NEGATIVE  BILIRUBINUR SMALL*  KETONESUR 15*  PROTEINUR 30*  NITRITE NEGATIVE  LEUKOCYTESUR NEGATIVE    Imaging/Diagnostic Tests: EXAM 9/15: ABDOMEN ULTRASOUND COMPLETE  IMPRESSION: 1. No acute abnormality seen to explain the patient's symptoms. 2. Changes of hepatic cirrhosis noted. Evaluation for underlying hepatic mass is limited on ultrasound. Depending on the degree of clinical concern, dynamic liver protocol MRI or CT could be considered for further evaluation. 3. Cholelithiasis, with echogenic sludge in the gallbladder. No evidence for obstruction or cholecystitis. 4. Suspect mild splenic varices.    Collier Salina, MD PGY-II Internal Medicine Resident Pager# [phone] 09/10/2016, 4:40 PM

## 2016-09-10 NOTE — Progress Notes (Signed)
CRITICAL VALUE ALERT  Critical value received:  Magnesium 0.8  Date of notification:  09/10/16  Time of notification:  06:37 Critical value read back:Yes.    Nurse who received alert:  Donnie Aho  MD notified (1st page):  Karll,MD  Time of first page: 06:46  MD notified (2nd page):  Time of second page:  Responding MD:  Morrison Old  Time MD responded:  06:46

## 2016-09-10 NOTE — Progress Notes (Signed)
Initial Nutrition Assessment  DOCUMENTATION CODES:   Non-severe (moderate) malnutrition in context of chronic illness  INTERVENTION:  Monitor magnesium, potassium, and phosphorus daily for at least 3 days, MD to replete as needed, as pt is at risk for refeeding syndrome given poor PO intake with vomiting >=1 month. Recommend providing 200 mg of thiamine daily for 5 days due to risk of refeeding syndrome Provide Multivitamin with minerals daily Provide Boost Breeze po TID, each supplement provides 250 kcal and 9 grams of protein   NUTRITION DIAGNOSIS:   Malnutrition related to chronic illness as evidenced by energy intake < 75% for > or equal to 1 month, moderate depletion of body fat, moderate depletions of muscle mass.   GOAL:   Patient will meet greater than or equal to 90% of their needs   MONITOR:   PO intake, Diet advancement, Supplement acceptance, Labs, Weight trends, Skin  REASON FOR ASSESSMENT:   Consult, Malnutrition Screening Tool Assessment of nutrition requirement/status  ASSESSMENT:   55 y.o. male presenting with decreased appetite found to have symptomatic anemia. PMH is significant for chronic anemia, alcoholic cirrhosis, esophageal varices, MGUS, HTN, CKD3.  Pt reports vomiting every other day and sometimes daily for the past month. He reports eating less than usual, but lately he hasn't been eating well- usually only one meal per day. He reports a usual body weight of 173 lbs. He feels that he started losing weight even before this past month due to poor PO intake. Pt drank clear liquids at breakfast but vomited afterward. He states that his throat is sore from vomiting. He is agreeable to trying Boost Breeze supplements.   Labs: low sodium, low chloride low potassium, low magnesium, high ferritin, folate lab pending, low hemoglobin Medications: folic acid, pantoprazole, potassium chloride, ondansetron, magnesium sulfate  Diet Order:  Diet clear liquid Room  service appropriate? Yes; Fluid consistency: Thin  Skin:  Reviewed, no issues  Last BM:  9/14  Height:   Ht Readings from Last 1 Encounters:  09/10/16 5\' 11"  (1.803 m)    Weight:   Wt Readings from Last 1 Encounters:  09/10/16 162 lb 0.6 oz (73.5 kg)    Ideal Body Weight:  78.18 kg  BMI:  Body mass index is 22.6 kg/m.  Estimated Nutritional Needs:   Kcal:  2000-2200  Protein:  80-90 grams  Fluid:  2.2 L/day  EDUCATION NEEDS:   No education needs identified at this time  [place], CSP, LDN Inpatient Clinical Dietitian Pager: [phone] After Hours Pager: [phone]

## 2016-09-10 NOTE — Progress Notes (Signed)
New Admission Note:   Arrival Method: Via stretcher from [facility] ED Mental Orientation: Alert and oriented x4 Telemetry: Box #1 Assessment: Completed Skin: See doc flowsheet IV: L Hand, Rt AC Pain: Denies Tubes: N/A Safety Measures: Safety Fall Prevention Plan has been given, discussed  Admission: Completed [address] [place] Orientation: Patient has been orientated to the room, unit and staff.  Family: Daughter at bedside  Orders have been reviewed and implemented. Will continue to monitor the patient. Call light has been placed within reach and bed alarm has been activated.   [employer], RN-BC Phone number: [phone]

## 2016-09-11 DIAGNOSIS — R197 Diarrhea, unspecified: Secondary | ICD-10-CM

## 2016-09-11 DIAGNOSIS — K7030 Alcoholic cirrhosis of liver without ascites: Secondary | ICD-10-CM

## 2016-09-11 DIAGNOSIS — R634 Abnormal weight loss: Secondary | ICD-10-CM | POA: Diagnosis not present

## 2016-09-11 DIAGNOSIS — D649 Anemia, unspecified: Secondary | ICD-10-CM | POA: Diagnosis not present

## 2016-09-11 DIAGNOSIS — E440 Moderate protein-calorie malnutrition: Secondary | ICD-10-CM | POA: Diagnosis not present

## 2016-09-11 LAB — BASIC METABOLIC PANEL
Anion gap: 10 (ref 5–15)
BUN: 10 mg/dL (ref 6–20)
CHLORIDE: 96 mmol/L — AB (ref 101–111)
CO2: 25 mmol/L (ref 22–32)
CREATININE: 1.03 mg/dL (ref 0.61–1.24)
Calcium: 6.6 mg/dL — ABNORMAL LOW (ref 8.9–10.3)
GFR calc non Af Amer: >60 mL/min (ref >60)
GLUCOSE: 112 mg/dL — AB (ref 65–99)
Potassium: 3.3 mmol/L — ABNORMAL LOW (ref 3.5–5.1)
Sodium: 131 mmol/L — ABNORMAL LOW (ref 135–145)

## 2016-09-11 LAB — PHOSPHORUS

## 2016-09-11 LAB — CBC
HCT: 23 % — ABNORMAL LOW (ref 39.0–52.0)
HEMOGLOBIN: 7.5 g/dL — AB (ref 13.0–17.0)
MCH: 31.1 pg (ref 26.0–34.0)
MCHC: 32.6 g/dL (ref 30.0–36.0)
MCV: 95.4 fL (ref 78.0–100.0)
Platelets: 93 10*3/uL — ABNORMAL LOW (ref 150–400)
RBC: 2.41 MIL/uL — AB (ref 4.22–5.81)
RDW: 19.8 % — ABNORMAL HIGH (ref 11.5–15.5)
WBC: 1.7 10*3/uL — ABNORMAL LOW (ref 4.0–10.5)

## 2016-09-11 LAB — CALCIUM, IONIZED: CALCIUM, IONIZED, SERUM: 3 mg/dL — AB (ref 4.5–5.6)

## 2016-09-11 LAB — MAGNESIUM: Magnesium: 1.7 mg/dL (ref 1.7–2.4)

## 2016-09-11 LAB — AMMONIA: AMMONIA: 31 umol/L (ref 9–35)

## 2016-09-11 LAB — HIV ANTIBODY (ROUTINE TESTING W REFLEX): HIV Screen 4th Generation wRfx: NONREACTIVE

## 2016-09-11 MED ORDER — POTASSIUM PHOSPHATE MONOBASIC 500 MG PO TABS: 1000.0000 mg | ORAL_TABLET | Freq: Three times a day (TID) | ORAL | Status: DC

## 2016-09-11 MED ORDER — K PHOS MONO-SOD PHOS DI & MONO 155-852-130 MG PO TABS
1000.0000 mg | ORAL_TABLET | Freq: Three times a day (TID) | ORAL | Status: AC
  Administered 2016-09-12 (×3): 1000 mg via ORAL
  Filled 2016-09-11 (×3): qty 4

## 2016-09-11 MED ORDER — POTASSIUM PHOSPHATE MONOBASIC 500 MG PO TABS
1000.0000 mg | ORAL_TABLET | Freq: Three times a day (TID) | ORAL | Status: DC
  Filled 2016-09-11 (×3): qty 2

## 2016-09-11 MED ORDER — K PHOS MONO-SOD PHOS DI & MONO 155-852-130 MG PO TABS
500.0000 mg | ORAL_TABLET | Freq: Once | ORAL | Status: AC
  Administered 2016-09-11: 500 mg via ORAL
  Filled 2016-09-11: qty 2

## 2016-09-11 NOTE — Progress Notes (Signed)
Results for ZEPHANIAH, SHEAHAN (MRN [id number]) as of 09/11/2016 06:43  Ref. Range 09/11/2016 05:34  Phosphorus Latest Ref Range: 2.5 - 4.6 mg/dL <1.0 (LL)  Paged MD on call awaiting return call.

## 2016-09-11 NOTE — Progress Notes (Signed)
Family Medicine Teaching Service Daily Progress Note Intern Pager: [phone]  Patient name: William [name] Medical record number: [id number] Date of birth: [birth year] Age: 55 y.o. Gender: male  Primary Care Provider: Dimas Chyle, MD Consultants: None Code Status: Full Code  Pt Overview and Major Events to Date:  9/15 admission for weakness with anemia, weight loss, and low appetite, transfused 2 units pRBCs  Assessment and Plan: William [name] is a 55 y.o. male presenting with decreased appetite found to have symptomatic anemia. PMH is significant for chronic anemia, alcoholic cirrhosis, esophageal varices, MGUS, HTN, CKD3.  Anorexia, Unintentional Weight Loss: The etiology of his anemia is still somewhat unclear. This is likely related to his cirrhosis but there may be an exacerbating factor present since he has worsened within the past few months. He does not have chronic pancreatitis. No Hx of gastritis is known. He does have cholelithiasis without inflammation but no biliary colic s/s on ROS. He has persistent hiccups and he does report complaints of acid reflux so gastritis is a possibility. Abdominal CT showed colonic wall thickening probably related to his chronic watery diarrhea but no malignancy. -Dietician consult -Trial reglan, consider thorazine for hiccups -GI pathogen panel -Can consider GI consultation if workup remains unremarkable for new changes  Acute on Chronic pancytopenia: His anemia remains stable today. No evidence of malignancy was seen on abdominal CT. HIV nonreactive. Additional considerations such as myeloma or other myeloproliferative disorder are also possible but serologies are pending. -f/u SPEP, urine studies -Transfuse as needed to goal Hgb >7 -Check vitamin B12, folate  Electrolyte derangements: Hypokalemia/hypocalcemia/hypomagnesemia/hypophosphatemia: Lytes improving some today although consider decrease due to refeeding. Phos <1.0 particularly. Will  continue to hold off on spironolactone since BP are fairly soft and K responding to supplementation. -Recheck potassium, Ng, phos in AM and adjust replacements -Consider restarting spironolactone if BP tolerates -Consider further workup with PTH -CaCO3 500mg  TID -K-Phos 1g TIDWM today -checking vitamin D 25 hydroxy -thiamin 200mg  PO daily  Alcoholic cirrhosis with history of esophageal varices: Child Class A. He is on propranolol for variceal hemorrhage prophylaxis. Also on spironolactone at baseline. No lesions seen on CT. We will rescreen hep C since rarely is isolated, stable cirrhosis a major cause of worsening anemia. -Continue propranolol 20mg  BID  MGUS: Initial findings showing low urine protein suggesting against a proliferative gammopathy. However kappa/lamda light chain assay still pending and MM panel. -SPEP and IFE, kappa/lambda light chains  HTN: BP 100s/60s, fairly soft but seems somewhat close to baseline -Continue lower dose of propranolol  AKI on CKD3: SCr improved to 1.03 so most likely this was a pre-renal deficit that is improved with fluids. In fact better than predicted baseline. -Giving IV fluids as above -Follow daily metabolic panel  Gout: Continue home allopurinol 100mg  daily. Hold home colchicine.  FEN/GI: Clear liquids, to be advanced as tolerated Prophylaxis: SCDs  Disposition: Pending further evaluation  Subjective:  William [name] is feeling well this morning. He had an episode of what he thinks is acid reflux pain and vomiting after drinking broth yesterday but wants to try eating more substantial food.  Objective: Temp:  [97.8 F (36.6 C)-98.2 F (36.8 C)] 98.2 F (36.8 C) (09/17 0759) Pulse Rate:  [77-81] 79 (09/17 0759) Resp:  [16-17] 16 (09/17 0759) BP: (102-123)/(72-87) 102/72 (09/17 0759) SpO2:  [99 %-100 %] 99 % (09/17 0759) Physical Exam: General: Man resting comfortably in no acute distress Cardiovascular: RRR, no  murmurs Respiratory: CTAB, normal WOB Abdomen: Soft,  nontender, nondistended, no fluid wave or tympany, BS+ Extremities: No peripheral edema noted  Laboratory:  Recent Labs Lab 09/09/16 1937 09/10/16 0436 09/11/16 1028  WBC 1.4* 1.5* 1.7*  HGB 5.7* 7.3* 7.5*  HCT 17.5* 21.7* 23.0*  PLT 89* 85* 93*    Recent Labs Lab 09/09/16 1937 09/10/16 0436 09/11/16 0708  NA 128* 129* 131*  K 2.7* 3.3* 3.3*  CL 89* 92* 96*  CO2 21* 22 25  BUN 16 17 10   CREATININE 1.47* 1.48* 1.03  CALCIUM 6.2* 6.1* 6.6*  PROT 7.0 6.1*  --   BILITOT 1.9* 3.1*  --   ALKPHOS 138* 119  --   ALT 26 22  --   AST 144* 125*  --   GLUCOSE 103* 107* 112*   Liver Function Tests:  Recent Labs  09/09/16 1937 09/10/16 0436  AST 144* 125*  ALT 26 22  ALKPHOS 138* 119  BILITOT 1.9* 3.1*  PROT 7.0 6.1*  ALBUMIN 3.1* 2.8*    Recent Labs  09/09/16 1937  LIPASE 108*   Anemia Panel:  Recent Labs  09/10/16 0441  VITAMINB12 367  FERRITIN 2,754*  RETICCTPCT 1.0   Urinalysis:  Recent Labs  09/10/16 0203  COLORURINE AMBER*  LABSPEC 1.013  PHURINE 6.0  GLUCOSEU NEGATIVE  HGBUR NEGATIVE  BILIRUBINUR SMALL*  KETONESUR 15*  PROTEINUR 30*  NITRITE NEGATIVE  LEUKOCYTESUR NEGATIVE    Imaging/Diagnostic Tests: EXAM 9/15: ABDOMEN ULTRASOUND COMPLETE  IMPRESSION: 1. No acute abnormality seen to explain the patient's symptoms. 2. Changes of hepatic cirrhosis noted. Evaluation for underlying hepatic mass is limited on ultrasound. Depending on the degree of clinical concern, dynamic liver protocol MRI or CT could be considered for further evaluation. 3. Cholelithiasis, with echogenic sludge in the gallbladder. No evidence for obstruction or cholecystitis. 4. Suspect mild splenic varices.  EXAM 9/16: CT ABDOMEN WITH CONTRAST IMPRESSION: 1. Cirrhosis without focal hepatic lesion. Splenomegaly. Trace ascites and mesenteric edema. 2. Cholelithiasis without gallbladder inflammation. 3.  Mild colonic wall thickening involving the ascending and descending colon is nonspecific, can be seen with portal enteropathy versus mild colitis of infectious or inflammatory etiology. 4. Abdominal aortic atherosclerosis.    Collier Salina, MD PGY-II Internal Medicine Resident Pager# [phone] 09/11/2016, 1:09 PM

## 2016-09-12 ENCOUNTER — Encounter (HOSPITAL_COMMUNITY): Payer: Self-pay | Admitting: Family Medicine

## 2016-09-12 DIAGNOSIS — N179 Acute kidney failure, unspecified: Secondary | ICD-10-CM | POA: Diagnosis present

## 2016-09-12 DIAGNOSIS — I8500 Esophageal varices without bleeding: Secondary | ICD-10-CM | POA: Diagnosis present

## 2016-09-12 DIAGNOSIS — K7030 Alcoholic cirrhosis of liver without ascites: Secondary | ICD-10-CM | POA: Diagnosis present

## 2016-09-12 DIAGNOSIS — E860 Dehydration: Secondary | ICD-10-CM | POA: Diagnosis present

## 2016-09-12 DIAGNOSIS — D61818 Other pancytopenia: Principal | ICD-10-CM

## 2016-09-12 DIAGNOSIS — E876 Hypokalemia: Secondary | ICD-10-CM | POA: Diagnosis present

## 2016-09-12 DIAGNOSIS — K639 Disease of intestine, unspecified: Secondary | ICD-10-CM

## 2016-09-12 DIAGNOSIS — C61 Malignant neoplasm of prostate: Secondary | ICD-10-CM

## 2016-09-12 DIAGNOSIS — E872 Acidosis: Secondary | ICD-10-CM | POA: Diagnosis present

## 2016-09-12 DIAGNOSIS — R197 Diarrhea, unspecified: Secondary | ICD-10-CM

## 2016-09-12 DIAGNOSIS — R5383 Other fatigue: Secondary | ICD-10-CM | POA: Diagnosis present

## 2016-09-12 DIAGNOSIS — M109 Gout, unspecified: Secondary | ICD-10-CM | POA: Diagnosis present

## 2016-09-12 DIAGNOSIS — K219 Gastro-esophageal reflux disease without esophagitis: Secondary | ICD-10-CM | POA: Diagnosis present

## 2016-09-12 DIAGNOSIS — E871 Hypo-osmolality and hyponatremia: Secondary | ICD-10-CM | POA: Diagnosis present

## 2016-09-12 DIAGNOSIS — I129 Hypertensive chronic kidney disease with stage 1 through stage 4 chronic kidney disease, or unspecified chronic kidney disease: Secondary | ICD-10-CM | POA: Diagnosis present

## 2016-09-12 DIAGNOSIS — D472 Monoclonal gammopathy: Secondary | ICD-10-CM | POA: Diagnosis present

## 2016-09-12 DIAGNOSIS — D619 Aplastic anemia, unspecified: Secondary | ICD-10-CM

## 2016-09-12 DIAGNOSIS — I700 Atherosclerosis of aorta: Secondary | ICD-10-CM | POA: Diagnosis present

## 2016-09-12 DIAGNOSIS — N183 Chronic kidney disease, stage 3 (moderate): Secondary | ICD-10-CM | POA: Diagnosis present

## 2016-09-12 DIAGNOSIS — E440 Moderate protein-calorie malnutrition: Secondary | ICD-10-CM | POA: Diagnosis not present

## 2016-09-12 DIAGNOSIS — R630 Anorexia: Secondary | ICD-10-CM | POA: Diagnosis present

## 2016-09-12 DIAGNOSIS — Z8546 Personal history of malignant neoplasm of prostate: Secondary | ICD-10-CM | POA: Diagnosis not present

## 2016-09-12 DIAGNOSIS — K7010 Alcoholic hepatitis without ascites: Secondary | ICD-10-CM | POA: Diagnosis present

## 2016-09-12 DIAGNOSIS — R066 Hiccough: Secondary | ICD-10-CM | POA: Diagnosis present

## 2016-09-12 DIAGNOSIS — K8020 Calculus of gallbladder without cholecystitis without obstruction: Secondary | ICD-10-CM | POA: Diagnosis present

## 2016-09-12 LAB — KAPPA/LAMBDA LIGHT CHAINS
KAPPA FREE LGHT CHN: 117.1 mg/L — AB (ref 3.3–19.4)
KAPPA, LAMDA LIGHT CHAIN RATIO: 1.98 — AB (ref 0.26–1.65)
Lambda free light chains: 59.2 mg/L — ABNORMAL HIGH (ref 5.7–26.3)

## 2016-09-12 LAB — GASTROINTESTINAL PANEL BY PCR, STOOL (REPLACES STOOL CULTURE)

## 2016-09-12 LAB — HEMOGLOBIN AND HEMATOCRIT, BLOOD
HCT: 27.4 % — ABNORMAL LOW (ref 39.0–52.0)
Hemoglobin: 9 g/dL — ABNORMAL LOW (ref 13.0–17.0)

## 2016-09-12 LAB — CBC
HEMATOCRIT: 20.4 % — AB (ref 39.0–52.0)
HEMOGLOBIN: 6.7 g/dL — AB (ref 13.0–17.0)
MCH: 31.5 pg (ref 26.0–34.0)
MCHC: 32.8 g/dL (ref 30.0–36.0)
MCV: 95.8 fL (ref 78.0–100.0)
Platelets: 84 10*3/uL — ABNORMAL LOW (ref 150–400)
RBC: 2.13 MIL/uL — AB (ref 4.22–5.81)
RDW: 19.6 % — ABNORMAL HIGH (ref 11.5–15.5)
WBC: 1.6 10*3/uL — AB (ref 4.0–10.5)

## 2016-09-12 LAB — HEPATITIS C ANTIBODY (REFLEX)

## 2016-09-12 LAB — BASIC METABOLIC PANEL
ANION GAP: 8 (ref 5–15)
BUN: 10 mg/dL (ref 6–20)
CHLORIDE: 100 mmol/L — AB (ref 101–111)
CO2: 24 mmol/L (ref 22–32)
Calcium: 6.8 mg/dL — ABNORMAL LOW (ref 8.9–10.3)
Creatinine, Ser: 0.95 mg/dL (ref 0.61–1.24)
Glucose, Bld: 95 mg/dL (ref 65–99)
POTASSIUM: 3.2 mmol/L — AB (ref 3.5–5.1)
Sodium: 132 mmol/L — ABNORMAL LOW (ref 135–145)

## 2016-09-12 LAB — HCV COMMENT:

## 2016-09-12 LAB — PHOSPHORUS: PHOSPHORUS: 1.1 mg/dL — AB (ref 2.5–4.6)

## 2016-09-12 LAB — MAGNESIUM: MAGNESIUM: 1.3 mg/dL — AB (ref 1.7–2.4)

## 2016-09-12 LAB — VITAMIN D 25 HYDROXY (VIT D DEFICIENCY, FRACTURES): VIT D 25 HYDROXY: 5.3 ng/mL — AB (ref 30.0–100.0)

## 2016-09-12 LAB — PREPARE RBC (CROSSMATCH)

## 2016-09-12 MED ORDER — SODIUM CHLORIDE 0.9 % IV SOLN
Freq: Once | INTRAVENOUS | Status: AC
  Administered 2016-09-12: 06:00:00 via INTRAVENOUS

## 2016-09-12 MED ORDER — MAGNESIUM SULFATE 2 GM/50ML IV SOLN
2.0000 g | Freq: Once | INTRAVENOUS | Status: AC
Start: 2016-09-12 — End: 2016-09-12
  Administered 2016-09-12: 2 g via INTRAVENOUS
  Filled 2016-09-12: qty 50

## 2016-09-12 MED ORDER — POTASSIUM CHLORIDE CRYS ER 20 MEQ PO TBCR
40.0000 meq | EXTENDED_RELEASE_TABLET | Freq: Two times a day (BID) | ORAL | Status: AC
  Administered 2016-09-12 (×2): 40 meq via ORAL
  Filled 2016-09-12 (×2): qty 2

## 2016-09-12 MED ORDER — SODIUM CHLORIDE 0.9 % IV SOLN: INTRAVENOUS | Status: DC

## 2016-09-12 NOTE — Progress Notes (Signed)
Results for MOSSIMO, SZATKOWSKI (MRN [id number]) as of 09/12/2016 05:34  Ref. Range 09/12/2016 04:11  Hemoglobin Latest Ref Range: 13.0 - 17.0 g/dL 6.7 (LL)  MD on call paged. Awaiting return call.

## 2016-09-12 NOTE — Progress Notes (Signed)
Family Medicine Teaching Service Daily Progress Note Intern Pager: [phone]  Patient name: William [name] Medical record number: [id number] Date of birth: October 15, 1961 Age: 55 y.o. Gender: male  Primary Care Provider: Dimas Chyle, MD Consultants: None Code Status: Full Code  Pt Overview and Major Events to Date:  9/15: admission for weakness with anemia, weight loss, and low appetite, transfused 2 units PRBCs 9/18: transfused 1 units PRBCs  Assessment and Plan: William [name] is a 55 y.o. male presenting with decreased appetite found to have symptomatic anemia. PMH is significant for chronic anemia, alcoholic cirrhosis, esophageal varices, concern for MGUS, HTN, CKD3.  1. Anorexia, Unintentional Weight Loss: Cirrhosis. No Hx of gastritis is known. He does have cholelithiasis without inflammation but no biliary colic s/s on ROS. Does report complaints of acid reflux so gastritis is a possibility. Abdominal CT showed colonic wall thickening probably related to his chronic watery diarrhea but no malignancy. Nutrion consult rec advanced diet with consideration of supplements, weight trends. - Tolerating reglan, consider thorazine for hiccups if they return - GI pathogen panel pending - GI consulted for weight loss and h/o decreased appetite 6 months and no PO intake except water for past week, appreciate recs  2. Acute on Chronic Pancytopenia/Anemia:: His anemia remains stable today. Abdominal CT revealed no obvious malignancy.Marland Kitchen HIV nonreactive. Broad differential.. S/p 1 unit PRBC today 9/18. B12 wnl. - F/u SPEP, urine studies - Transfuse as needed to goal Hgb >7, received 1 unit with great response on 9/18 AM 2/2 Hgb 6.7>9.0 - Folate pending - Haptoglobin pending  3. Electrolyte derangements: Hypokalemia/hypocalcemia/hypomagnesemia/hypophosphatemia :Most electrolytes normalizing. with treatment and continue replacement.  - Continue trending CMET, Mg, and Phos - Consider restarting  spironolactone if BP tolerates - Consider further workup with PTH - K-Phos 1g TID - Checking vitamin D 25 hydroxy - Thiamine 200mg  PO daily  4. Alcoholic cirrhosis with history of esophageal varices: Child Class A. He is on propranolol for variceal hemorrhage prophylaxis. Also on spironolactone at baseline. No lesions seen on CT. We will rescreen hep C since rarely is isolated, stable cirrhosis a major cause of worsening anemia. - Continue propranolol 20mg  BID  5. MGUS: Initial findings showing low urine protein suggesting against a proliferative gammopathy. However kappa/lamda light chain assay still pending and MM panel. - SPEP and IFE, kappa/lambda light chains  6. HTN: BP 90-100s/60s, fairly soft but seems somewhat close to baseline - Continue lower dose of propranolol  7. AKI on CKD3: SCr improved to 1.03 so most likely this was a pre-renal deficit that is improved with fluids. In fact better than predicted baseline. - Giving IV fluids as above - Follow daily metabolic panel  8. Gout: Continue home allopurinol 100mg  daily. Hold home colchicine.  FEN/GI: Clear liquids, to be advanced as tolerated Prophylaxis: SCDs  Disposition: Pending further evaluation  Subjective:  Doing well, laying in bed. Finished 100% of breakfast. Asking for advanced diet. No complaints.  Objective: Temp:  [98.2 F (36.8 C)-99.5 F (37.5 C)] 98.2 F (36.8 C) (09/18 0905) Pulse Rate:  [79-87] 87 (09/18 0905) Resp:  [16-20] 18 (09/18 0905) BP: (92-101)/(58-70) 94/65 (09/18 0905) SpO2:  [99 %-100 %] 100 % (09/18 0905) Physical Exam: General: Man resting comfortably in no acute distress with non-toxic appearance HEENT: normocephalic, atraumatic, moist mucous membranes Neck: supple, non-tender without lymphadenopathy CV: regular rate and rhythm without murmurs, rubs, or gallops Lungs: clear to auscultation bilaterally with normal work of breathing Abdomen: soft, non-tender, no masses or  organomegaly palpable, normoactive bowel sounds Skin: warm, dry, no rashes or lesions, cap refill < 2 seconds Extremities: warm and well perfused, normal tone  Laboratory:  Recent Labs Lab 09/10/16 0436 09/11/16 1028 09/12/16 0411 09/12/16 1226  WBC 1.5* 1.7* 1.6*  --   HGB 7.3* 7.5* 6.7* 9.0*  HCT 21.7* 23.0* 20.4* 27.4*  PLT 85* 93* 84*  --     Recent Labs Lab 09/09/16 1937 09/10/16 0436 09/11/16 0708 09/12/16 0411  NA 128* 129* 131* 132*  K 2.7* 3.3* 3.3* 3.2*  CL 89* 92* 96* 100*  CO2 21* 22 25 24   BUN 16 17 10 10   CREATININE 1.47* 1.48* 1.03 0.95  CALCIUM 6.2* 6.1* 6.6* 6.8*  PROT 7.0 6.1*  --   --   BILITOT 1.9* 3.1*  --   --   ALKPHOS 138* 119  --   --   ALT 26 22  --   --   AST 144* 125*  --   --   GLUCOSE 103* 107* 112* 95   Liver Function Tests:  Recent Labs  09/09/16 1937 09/10/16 0436  AST 144* 125*  ALT 26 22  ALKPHOS 138* 119  BILITOT 1.9* 3.1*  PROT 7.0 6.1*  ALBUMIN 3.1* 2.8*    Recent Labs  09/09/16 1937  LIPASE 108*   Anemia Panel:  Recent Labs  09/10/16 0441  VITAMINB12 367  FERRITIN 2,754*  RETICCTPCT 1.0   Urinalysis:  Recent Labs  09/10/16 0203  COLORURINE AMBER*  LABSPEC 1.013  PHURINE 6.0  GLUCOSEU NEGATIVE  HGBUR NEGATIVE  BILIRUBINUR SMALL*  KETONESUR 15*  PROTEINUR 30*  NITRITE NEGATIVE  LEUKOCYTESUR NEGATIVE    Imaging/Diagnostic Tests: EXAM 9/15: ABDOMEN ULTRASOUND COMPLETE  IMPRESSION: 1. No acute abnormality seen to explain the patient's symptoms. 2. Changes of hepatic cirrhosis noted. Evaluation for underlying hepatic mass is limited on ultrasound. Depending on the degree of clinical concern, dynamic liver protocol MRI or CT could be considered for further evaluation. 3. Cholelithiasis, with echogenic sludge in the gallbladder. No evidence for obstruction or cholecystitis. 4. Suspect mild splenic varices.  EXAM 9/16: CT ABDOMEN WITH CONTRAST IMPRESSION: 1. Cirrhosis without focal  hepatic lesion. Splenomegaly. Trace ascites and mesenteric edema. 2. Cholelithiasis without gallbladder inflammation. 3. Mild colonic wall thickening involving the ascending and descending colon is nonspecific, can be seen with portal enteropathy versus mild colitis of infectious or inflammatory etiology. 4. Abdominal aortic atherosclerosis.    Harriet [place], DO PGY-I Family Medicine Resident Pager# [phone] 09/12/2016, 2:29 PM  CALL PAGER [phone] for any questions or notifications regarding this patient   FMTS Attending Daily Note: Dorcas Mcmurray MD  Attending pager:[phone]  office [phone]  I  have seen and examined this patient, reviewed their chart. I have discussed this patient with the resident. I agree with the resident's findings, assessment and care plan.

## 2016-09-12 NOTE — Consult Note (Signed)
Subjective:   HPI  The patient is a 55 year old male with a history of alcoholic cirrhosis of the liver which was diagnosed several years ago. He had an EGD back in 2011 which showed small esophageal varices. He has been on beta blocker therapy for prophylaxis against bleed for quite some time. He has never had a variceal bleed. There are also other multiple medical problems as mentioned under the past medical history. He was admitted to the hospital because of symptomatic anemia. He was found to be heme negative. He denies any signs of upper or lower GI bleeding. He had a colonoscopy in 2011 which was unrevealing for anything significant. We are asked to see him cause over the past 2 months he has been experiencing loss of appetite and weight loss of around 15 pounds. He denies abdominal pain but states that he does have some heartburn but this is controlled with his heartburn medication. He had a CT scan of the abdomen done which showed evidence of cirrhosis of the liver but there were no focal hepatic lesions to suggest malignancy. He does have splenomegaly. There was trace ascites and some mesenteric edema. There was also cholelithiasis. The CT also shows mild colonic wall thickening involving the ascending and descending colon but it was felt this was nonspecific and can be seen with portal enteropathy. He denies diarrhea. There is abdominal aortic atherosclerosis. He denies postprandial abdominal pain. He doesn't really complain of nausea.  Review of Systems Denies chest pain. He was short of breath with exertion given his anemia.  Past Medical History:  Diagnosis Date  . Anemia   . Chronic kidney disease   . Cirrhosis of liver ([place])   . COMPRESSION FRACTURE, LUMBAR VERTEBRAE 07/29/2010   Qualifier: Diagnosis of  By: Anabel Bene    . Hypertension    Past Surgical History:  Procedure Laterality Date  . ACHILLES TENDON REPAIR     Social History   Social History  . Marital status:  Single    Spouse name: N/A  . Number of children: N/A  . Years of education: N/A   Occupational History  . Not on file.   Social History Main Topics  . Smoking status: Never Smoker  . Smokeless tobacco: Never Used  . Alcohol use Yes     Comment: 1 pint per week  . Drug use: No  . Sexual activity: Not on file   Other Topics Concern  . Not on file   Social History Narrative  . No narrative on file   family history includes Arthritis in his mother; Hypertension in his father; Kidney disease in his mother.  Current Facility-Administered Medications:  .  acetaminophen (TYLENOL) tablet 650 mg, 650 mg, Oral, Q6H PRN **OR** acetaminophen (TYLENOL) suppository 650 mg, 650 mg, Rectal, Q6H PRN, Milagros Loll, MD .  allopurinol (ZYLOPRIM) tablet 100 mg, 100 mg, Oral, Daily, Milagros Loll, MD, 100 mg at 09/12/16 1041 .  feeding supplement (BOOST / RESOURCE BREEZE) liquid 1 Container, 1 Container, Oral, TID BM, Blane Ohara McDiarmid, MD, 1 Container at 09/12/16 1042 .  folic acid (FOLVITE) tablet 1 mg, 1 mg, Oral, Daily, Milagros Loll, MD, 1 mg at 09/12/16 1042 .  metoCLOPramide (REGLAN) tablet 5 mg, 5 mg, Oral, TID AC, Collier Salina, MD, 5 mg at 09/12/16 1041 .  multivitamin with minerals tablet 1 tablet, 1 tablet, Oral, Daily, Blane Ohara McDiarmid, MD, 1 tablet at 09/12/16 1042 .  ondansetron (ZOFRAN) tablet 4 mg,  4 mg, Oral, Q6H PRN, 4 mg at 09/10/16 2110 **OR** ondansetron (ZOFRAN) injection 4 mg, 4 mg, Intravenous, Q6H PRN, Milagros Loll, MD .  pantoprazole (PROTONIX) EC tablet 40 mg, 40 mg, Oral, Daily, Milagros Loll, MD, 40 mg at 09/12/16 1042 .  phosphorus (K PHOS NEUTRAL) tablet 1,000 mg, 1,000 mg, Oral, TID WC, Todd D McDiarmid, MD, 1,000 mg at 09/12/16 1241 .  potassium chloride SA (K-DUR,KLOR-CON) CR tablet 40 mEq, 40 mEq, Oral, BID, Nicolette Bang, DO, 40 mEq at 09/12/16 1042 .  propranolol (INDERAL) tablet 20 mg, 20 mg, Oral, BID, Milagros Loll, MD, 20 mg at  09/12/16 1041 .  sodium chloride flush (NS) 0.9 % injection 3 mL, 3 mL, Intravenous, Q12H, Milagros Loll, MD, 3 mL at 09/12/16 1045 .  thiamine (VITAMIN B-1) tablet 200 mg, 200 mg, Oral, Daily, Collier Salina, MD, 200 mg at 09/12/16 1041 No Known Allergies   Objective:     BP 94/65 (BP Location: Left Arm)   Pulse 87   Temp 98.2 F (36.8 C) (Oral)   Resp 18   Ht 5\' 11"  (1.803 m)   Wt 73.5 kg (162 lb 0.6 oz)   SpO2 100%   BMI 22.60 kg/m   He does not appear in any acute distress  Nonicteric  Heart regular rhythm no murmurs  Lungs clear  Abdomen: Bowel sounds present, soft, nontender, no masses felt  Laboratory No components found for: D1    Assessment:     Alcoholic cirrhosis of the liver  No evidence of hepatoma on CT scan  History of esophageal varices, but no evidence of gastrointestinal bleeding.  Weight loss and poor appetite over the past couple of months of uncertain etiology.  Gallstones      Plan:     In speaking to the patient and to the resident we talked about doing an EGD to determine if there is any abnormality in the upper GI tract which might explain his symptoms of poor appetite or weight loss. I do not think he needs another colonoscopy. His anemia will be further evaluated by the primary team. He has seen Dr. Alen Blew from heme/ onc in the past. Lab Results  Component Value Date   HGB 9.0 (L) 09/12/2016   HGB 6.7 (LL) 09/12/2016   HGB 7.5 (L) 09/11/2016   HGB 13.3 03/07/2013   HGB 12.8 (L) 09/07/2012   HGB 11.9 (L) 02/07/2012   HCT 27.4 (L) 09/12/2016   HCT 20.4 (L) 09/12/2016   HCT 23.0 (L) 09/11/2016   HCT 39.4 03/07/2013   HCT 38.7 09/07/2012   HCT 35.1 (L) 02/07/2012   ALKPHOS 119 09/10/2016   ALKPHOS 138 (H) 09/09/2016   ALKPHOS 161 (H) 02/08/2016   ALKPHOS 399 (H) 03/07/2013   ALKPHOS 310 (H) 09/07/2012   AST 125 (H) 09/10/2016   AST 144 (H) 09/09/2016   AST 221 (H) 02/08/2016   AST 79 (H) 03/07/2013   AST 41 (H)  09/07/2012   ALT 22 09/10/2016   ALT 26 09/09/2016   ALT 48 (H) 02/08/2016   ALT 35 03/07/2013   ALT 22 09/07/2012

## 2016-09-13 ENCOUNTER — Encounter (HOSPITAL_COMMUNITY): Payer: Self-pay | Admitting: *Deleted

## 2016-09-13 ENCOUNTER — Encounter (HOSPITAL_COMMUNITY)
Admission: EM | Disposition: A | Discharge status: Home / Self Care | Payer: Self-pay | Source: Home / Self Care | Attending: Family Medicine

## 2016-09-13 ENCOUNTER — Inpatient Hospital Stay (HOSPITAL_COMMUNITY): Payer: Commercial Managed Care - HMO | Admitting: Certified Registered Nurse Anesthetist

## 2016-09-13 HISTORY — PX: ESOPHAGOGASTRODUODENOSCOPY (EGD) WITH PROPOFOL: SHX5813

## 2016-09-13 LAB — TYPE AND SCREEN
ABO/RH(D): B POS
ANTIBODY SCREEN: NEGATIVE
UNIT DIVISION: 0
Unit division: 0
Unit division: 0

## 2016-09-13 LAB — MULTIPLE MYELOMA PANEL, SERUM
ALBUMIN/GLOB SERPL: 1.1 (ref 0.7–1.7)
ALPHA2 GLOB SERPL ELPH-MCNC: 0.3 g/dL — AB (ref 0.4–1.0)
Albumin SerPl Elph-Mcnc: 3 g/dL (ref 2.9–4.4)
Alpha 1: 0.2 g/dL (ref 0.0–0.4)
B-GLOBULIN SERPL ELPH-MCNC: 1 g/dL (ref 0.7–1.3)
Gamma Glob SerPl Elph-Mcnc: 1.3 g/dL (ref 0.4–1.8)
Globulin, Total: 2.9 g/dL (ref 2.2–3.9)
IGG (IMMUNOGLOBIN G), SERUM: 1452 mg/dL (ref 700–1600)
IGM, SERUM: 146 mg/dL (ref 20–172)
IgA: 392 mg/dL — ABNORMAL HIGH (ref 90–386)
Total Protein ELP: 5.9 g/dL — ABNORMAL LOW (ref 6.0–8.5)

## 2016-09-13 LAB — PSA: PSA: 0.37 ng/mL (ref 0.00–4.00)

## 2016-09-13 LAB — CBC
HEMATOCRIT: 22.8 % — AB (ref 39.0–52.0)
HEMATOCRIT: 25.1 % — AB (ref 39.0–52.0)
HEMOGLOBIN: 7.5 g/dL — AB (ref 13.0–17.0)
HEMOGLOBIN: 8.1 g/dL — AB (ref 13.0–17.0)
MCH: 30.6 pg (ref 26.0–34.0)
MCH: 30.8 pg (ref 26.0–34.0)
MCHC: 32.9 g/dL (ref 30.0–36.0)
MCHC: 32.3 g/dL (ref 30.0–36.0)
MCV: 93.1 fL (ref 78.0–100.0)
MCV: 95.4 fL (ref 78.0–100.0)
Platelets: 86 10*3/uL — ABNORMAL LOW (ref 150–400)
Platelets: 86 10*3/uL — ABNORMAL LOW (ref 150–400)
RBC: 2.45 MIL/uL — AB (ref 4.22–5.81)
RBC: 2.63 MIL/uL — ABNORMAL LOW (ref 4.22–5.81)
RDW: 21.9 % — ABNORMAL HIGH (ref 11.5–15.5)
RDW: 21.5 % — ABNORMAL HIGH (ref 11.5–15.5)
WBC: 1.4 10*3/uL — CL (ref 4.0–10.5)
WBC: 1.8 10*3/uL — ABNORMAL LOW (ref 4.0–10.5)

## 2016-09-13 LAB — FOLATE RBC
FOLATE, HEMOLYSATE: 240.4 ng/mL
Folate, RBC: 1156 ng/mL (ref >498)
HEMATOCRIT: 20.8 % — AB (ref 37.5–51.0)

## 2016-09-13 LAB — BASIC METABOLIC PANEL
Anion gap: 8 (ref 5–15)
BUN: 9 mg/dL (ref 6–20)
CHLORIDE: 101 mmol/L (ref 101–111)
CO2: 25 mmol/L (ref 22–32)
Calcium: 7.2 mg/dL — ABNORMAL LOW (ref 8.9–10.3)
Creatinine, Ser: 0.94 mg/dL (ref 0.61–1.24)
GFR calc Af Amer: >60 mL/min (ref >60)
GFR calc non Af Amer: >60 mL/min (ref >60)
Glucose, Bld: 85 mg/dL (ref 65–99)
POTASSIUM: 3.9 mmol/L (ref 3.5–5.1)
SODIUM: 134 mmol/L — AB (ref 135–145)

## 2016-09-13 LAB — HAPTOGLOBIN

## 2016-09-13 LAB — AMYLASE: Amylase: 96 U/L (ref 28–100)

## 2016-09-13 LAB — PATHOLOGIST SMEAR REVIEW

## 2016-09-13 LAB — PHOSPHORUS: PHOSPHORUS: 1.5 mg/dL — AB (ref 2.5–4.6)

## 2016-09-13 LAB — MAGNESIUM: Magnesium: 1.2 mg/dL — ABNORMAL LOW (ref 1.7–2.4)

## 2016-09-13 SURGERY — ESOPHAGOGASTRODUODENOSCOPY (EGD) WITH PROPOFOL
Anesthesia: Monitor Anesthesia Care

## 2016-09-13 MED ORDER — LACTATED RINGERS IV SOLN
INTRAVENOUS | Status: DC
  Administered 2016-09-13 (×2): via INTRAVENOUS

## 2016-09-13 MED ORDER — KETAMINE HCL 100 MG/ML IJ SOLN
INTRAMUSCULAR | Status: AC
  Filled 2016-09-13: qty 1

## 2016-09-13 MED ORDER — SODIUM PHOSPHATES 45 MMOLE/15ML IV SOLN
10.0000 mmol | Freq: Once | INTRAVENOUS | Status: AC
  Administered 2016-09-13: 10 mmol via INTRAVENOUS
  Filled 2016-09-13: qty 3.33

## 2016-09-13 MED ORDER — MAGNESIUM SULFATE 2 GM/50ML IV SOLN
2.0000 g | Freq: Once | INTRAVENOUS | Status: AC
  Administered 2016-09-13: 2 g via INTRAVENOUS
  Filled 2016-09-13: qty 50

## 2016-09-13 MED ORDER — PROPOFOL 10 MG/ML IV BOLUS
INTRAVENOUS | Status: DC | PRN
  Administered 2016-09-13: 10 mg via INTRAVENOUS

## 2016-09-13 MED ORDER — SPIRONOLACTONE 25 MG PO TABS
25.0000 mg | ORAL_TABLET | Freq: Every day | ORAL | Status: DC
  Administered 2016-09-13 – 2016-09-14 (×2): 25 mg via ORAL
  Filled 2016-09-13 (×2): qty 1

## 2016-09-13 MED ORDER — LIDOCAINE HCL (CARDIAC) 20 MG/ML IV SOLN
INTRAVENOUS | Status: DC | PRN
  Administered 2016-09-13: 50 mg via INTRATRACHEAL

## 2016-09-13 MED ORDER — POTASSIUM PHOSPHATES 15 MMOLE/5ML IV SOLN
20.0000 mmol | Freq: Once | INTRAVENOUS | Status: DC
  Filled 2016-09-13: qty 6.67

## 2016-09-13 MED ORDER — PROPOFOL 500 MG/50ML IV EMUL
INTRAVENOUS | Status: DC | PRN
  Administered 2016-09-13: 75 ug/kg/min via INTRAVENOUS

## 2016-09-13 NOTE — Anesthesia Procedure Notes (Signed)
Procedure Name: MAC Date/Time: 09/13/2016 10:17 AM Performed by: Tressia Miners LEFFEW Pre-anesthesia Checklist: Patient identified, Emergency Drugs available, Suction available, Timeout performed and Patient being monitored Patient Re-evaluated:Patient Re-evaluated prior to inductionOxygen Delivery Method: Nasal cannula Placement Confirmation: positive ETCO2

## 2016-09-13 NOTE — Addendum Note (Signed)
Addendum  created 09/13/16 1135 by Glynda Jaeger, CRNA   Charge Capture section accepted

## 2016-09-13 NOTE — Op Note (Signed)
[facility] Patient Name: William [name] Procedure Date : 09/13/2016 MRN: [id number] Attending MD: Wonda Horner , MD Date of Birth: 27-Oct-1961 CSN: [id number] Age: 55 Admit Type: Inpatient Procedure:                Upper GI endoscopy Indications:              Weight loss Providers:                Wonda Horner, MD, Cleda Daub, RN, Janie                            Billups, Technician, Tressia Miners, CRNA Referring MD:              Medicines:                Propofol per Anesthesia Complications:            No immediate complications. Estimated Blood Loss:     Estimated blood loss: none. Procedure:                Pre-Anesthesia Assessment:                           - Prior to the procedure, a History and Physical                            was performed, and patient medications and                            allergies were reviewed. The patient's tolerance of                            previous anesthesia was also reviewed. The risks                            and benefits of the procedure and the sedation                            options and risks were discussed with the patient.                            All questions were answered, and informed consent                            was obtained. Prior Anticoagulants: The patient has                            taken no previous anticoagulant or antiplatelet                            agents. ASA Grade Assessment: III - A patient with                            severe systemic disease. After reviewing the risks  and benefits, the patient was deemed in                            satisfactory condition to undergo the procedure.                           After obtaining informed consent, the endoscope was                            passed under direct vision. Throughout the                            procedure, the patient's blood pressure, pulse, and                            oxygen  saturations were monitored continuously. The                            [id number]) scope was introduced through the                            mouth, and advanced to the second part of duodenum.                            The upper GI endoscopy was accomplished without                            difficulty. The patient tolerated the procedure                            well. Scope In: Scope Out: Findings:      Grade I varices were found in the lower third of the esophagus. Very       small and flatten out with air insuflation.      The entire examined stomach was normal.      The examined duodenum was normal. Impression:               - Grade I esophageal varices.                           - Normal stomach.                           - Normal examined duodenum.                           - No specimens collected. Moderate Sedation:      . Recommendation:           - Resume regular diet.                           - Continue present medications. Procedure Code(s):        --- Professional ---                           [phone], Esophagogastroduodenoscopy, flexible,  transoral; diagnostic, including collection of                            specimen(s) by brushing or washing, when performed                            (separate procedure) Diagnosis Code(s):        --- Professional ---                           I85.00, Esophageal varices without bleeding                           R63.4, Abnormal weight loss CPT copyright 2016 [employer]. All rights reserved. The codes documented in this report are preliminary and upon coder review may  be revised to meet current compliance requirements. Wonda Horner, MD 09/13/2016 10:46:28 AM This report has been signed electronically. Number of Addenda: 0

## 2016-09-13 NOTE — Anesthesia Postprocedure Evaluation (Signed)
Anesthesia Post Note  Patient: WALDEMAR SCHWEPPE  Procedure(s) Performed: Procedure(s) (LRB): ESOPHAGOGASTRODUODENOSCOPY (EGD) WITH PROPOFOL (N/A)  Patient location during evaluation: PACU Anesthesia Type: MAC Level of consciousness: awake and alert Pain management: pain level controlled Vital Signs Assessment: post-procedure vital signs reviewed and stable Respiratory status: spontaneous breathing, nonlabored ventilation and respiratory function stable Cardiovascular status: stable and blood pressure returned to baseline Comments: Patient with brief destauration in O2 to 70s upon removal of EGD scope. BMV utilized and saturations improved. Adequate spontaneous ventilation. No visible aspiration.    Last Vitals:  Vitals:   09/13/16 1100 09/13/16 1110  BP: 97/68 104/78  Pulse: 87 85  Resp: 19 (!) 24  Temp:      Last Pain:  Vitals:   09/13/16 0907  TempSrc: Oral  PainSc:                  Nilda Simmer

## 2016-09-13 NOTE — Transfer of Care (Signed)
Immediate Anesthesia Transfer of Care Note  Patient: William [name]  Procedure(s) Performed: Procedure(s): ESOPHAGOGASTRODUODENOSCOPY (EGD) WITH PROPOFOL (N/A)  Patient Location: Endoscopy Unit  Anesthesia Type:MAC  Level of Consciousness: awake, alert , oriented, patient cooperative and responds to stimulation  Airway & Oxygen Therapy: Patient Spontanous Breathing and Patient connected to nasal cannula oxygen  Post-op Assessment: Report given to RN, Post -op Vital signs reviewed and stable and Patient moving all extremities X 4  Post vital signs: Reviewed and stable  Last Vitals:  Vitals:   09/13/16 0907 09/13/16 1100  BP: 108/87 97/68  Pulse: 75 87  Resp: 13 19  Temp: 37.1 C     Last Pain:  Vitals:   09/13/16 0907  TempSrc: Oral  PainSc:          Complications: No apparent anesthesia complications

## 2016-09-13 NOTE — Anesthesia Preprocedure Evaluation (Addendum)
Anesthesia Evaluation  Patient identified by MRN, date of birth, ID band Patient awake    Reviewed: Allergy & Precautions, NPO status , Patient's Chart, lab work & pertinent test results  History of Anesthesia Complications Negative for: history of anesthetic complications  Airway Mallampati: III  TM Distance: >3 FB Neck ROM: Full    Dental  (+) Missing, Dental Advisory Given   Pulmonary neg pulmonary ROS, neg shortness of breath,    Pulmonary exam normal breath sounds clear to auscultation       Cardiovascular hypertension, Pt. on medications (-) angina(-) Past MI, (-) Cardiac Stents and (-) Orthopnea (-) dysrhythmias  Rhythm:Regular Rate:Normal     Neuro/Psych neg Seizures Cervical pain    GI/Hepatic neg GERD  ,(+) Cirrhosis  (on propranalol and pantoprazole)  Esophageal Varices  substance abuse  alcohol use,   Endo/Other    Renal/GU Renal disease     Musculoskeletal   Abdominal   Peds  Hematology  (+) Blood dyscrasia (Hgb 5.7 on admission; transfused 2 pRBCs 9/15 and 1 unit 9/18; Hgb 7.5 this morning. Thrombocytopenia (plt 86)), anemia , MGUS   Anesthesia Other Findings H/o prostate cancer, gout, malnutrition, unintentional weight loss, electrolyte derangement  Reproductive/Obstetrics                            Anesthesia Physical Anesthesia Plan  ASA: IV  Anesthesia Plan:    Post-op Pain Management:    Induction: Intravenous  Airway Management Planned: Natural Airway and Nasal Cannula  Additional Equipment:   Intra-op Plan:   Post-operative Plan:   Informed Consent: I have reviewed the patients History and Physical, chart, labs and discussed the procedure including the risks, benefits and alternatives for the proposed anesthesia with the patient or authorized representative who has indicated his/her understanding and acceptance.   Dental advisory given  Plan Discussed  with: CRNA  Anesthesia Plan Comments:        Anesthesia Quick Evaluation

## 2016-09-13 NOTE — Consult Note (Signed)
[facility] CM Primary Care Navigator  09/13/2016  William [name] October 11, 1961 [id number]  Met with patient at the bedside to identify possible discharge needs.  Patient states he was sent here from his primary care provider's office whom he saw last Friday (09/09/16) because of increased weakness, weight loss in the last few months and loss of appetite. Patient confirms Dr. Dimas Chyle as his primary care provider.    Patient states using [name]) to obtain medications without any difficulties and he manages his own medications at home.   Patient lives by himself but has  brothers, sisters and a daughter [name]) who lives close by to  assist him if needed. Brother or friend will be able to provide transport to his doctor's appointments as stated. Transportation through [name] was also mentioned to patient as part of his insurance benefit.  Patient voiced understanding to call primary care provider's office for a post discharge follow-up appointment within a week or sooner if needs arise. Patient letter given as a reminder. Spoke to [name]) primary care provider's office to inform of patient's hospital admission and to follow-up with him when discharged.  He denies further needs or concerns at this time.   For additional questions please contact:  [name] Every, BSN, RN-BC [facility] PRIMARY CARE Navigator Cell: [phone]

## 2016-09-13 NOTE — Addendum Note (Signed)
Addendum  created 09/13/16 1207 by Glynda Jaeger, CRNA   Anesthesia Intra Meds edited

## 2016-09-13 NOTE — Progress Notes (Signed)
Family Medicine Teaching Service Daily Progress Note Intern Pager: [phone]  Patient name: William [name] Medical record number: [id number] Date of birth: 14-Jul-1961 Age: 55 y.o. Gender: male  Primary Care Provider: Dimas Chyle, MD Consultants: None Code Status: Full Code  Pt Overview and Major Events to Date:  9/15: admission for weakness with anemia, weight loss, and low appetite, transfused 2 units PRBCs 9/18: transfused 1 units PRBCs 9/19: EGD evaluation by GI for unexplained weight loss of 6 months  Assessment and Plan: William [name] is a 55 y.o. male presenting with decreased appetite found to have symptomatic anemia. PMH is significant for chronic anemia, alcoholic cirrhosis, esophageal varices, concern for MGUS, HTN, CKD3.  1. Anorexia, Unintentional Weight Loss: H/o alcoholic cirrhosis. No Hx of gastritis is known. He does have cholelithiasis without inflammation but no biliary colic s/s on ROS. Does report complaints of acid reflux so gastritis is a possibility. Abdominal CT showed colonic wall thickening probably related to his chronic watery diarrhea but no malignancy. Nutrion consult rec advanced diet with consideration of supplements, weight trends. GI panel neg for pathogens. - Tolerating reglan, consider thorazine for hiccups if they return - Giardia/crypto pending - GI consulted for weight loss and h/o decreased appetite 6 months and no PO intake except water for past week, appreciate recs, EGD 9/19, pending recs - AM cortisol pending 9/20  2. Acute on Chronic Pancytopenia/Anemia:: His anemia remains stable today. Abdominal CT revealed no obvious malignancy.Marland Kitchen HIV nonreactive. Broad differential.. S/p 1 unit PRBC today 9/18. B12 wnl. Low haptoglobin.  - F/u SPEP, urine studies - Transfuse as needed to goal Hgb >7, received 1 unit with great response on 9/18 AM 2/2 Hgb 6.7>9.0 - Folate pending  3. Electrolyte derangements:  Hypokalemia/hypocalcemia/hypomagnesemia/hypophosphatemia :Most electrolytes normalizing. with treatment and continue replacement.  - Continue trending CMET, Mg, and Phos, if low will consider supplemeting - Consider restarting spironolactone if BP tolerates - Consider further workup with PTH - Checking vitamin D 25 hydroxy - Thiamine 200mg  PO daily  4. Alcoholic cirrhosis with history of esophageal varices: Child Class A. He is on propranolol for variceal hemorrhage prophylaxis. Also on spironolactone at baseline. No lesions seen on CT. We will rescreen hep C since rarely is isolated, stable cirrhosis a major cause of worsening anemia. - Continue propranolol 20mg  BID  5. MGUS: Initial findings showing low urine protein suggesting against a proliferative gammopathy. However kappa/lamda light chain assay are both elevated. - SPEP and IFE pending  6. HTN: BP 100s/70s, seems somewhat close to baseline - Continue propranolol though primarily for esophogeal varices - Restarting spironolactone on 9/19  7. AKI on CKD3: resolved, Cr improved to 0.94 so most likely this was a pre-renal deficit that is improved with fluids. - Giving IV fluids as above - Follow daily metabolic panel  8. Gout: Continue home allopurinol 100mg  daily. Hold home colchicine.  FEN/GI: NPO for EGD since MN, to be advanced as tolerated Prophylaxis: SCDs  Disposition: Pending further evaluation  Subjective:  Doing well, laying in bed. NPO overnight about to have EGD performed by GI. No complaints this morning, says he is "feeling well."  Objective: Temp:  [98.2 F (36.8 C)-98.9 F (37.2 C)] 98.3 F (36.8 C) (09/19 0458) Pulse Rate:  [77-87] 77 (09/19 0458) Resp:  [14-18] 14 (09/19 0458) BP: (94-108)/(65-78) 108/78 (09/19 0458) SpO2:  [98 %-100 %] 100 % (09/19 0458) Weight:  [175 lb 8 oz (79.6 kg)] 175 lb 8 oz (79.6 kg) (09/18 2059) Physical Exam:  General: resting comfortably in no acute distress with  non-toxic appearance HEENT: normocephalic, atraumatic, moist mucous membranes Neck: supple, non-tender without lymphadenopathy CV: regular rate and rhythm without murmurs, rubs, or gallops Lungs: clear to auscultation bilaterally with normal work of breathing Abdomen: soft, non-tender, no masses or organomegaly palpable, normoactive bowel sounds Skin: warm, dry, no rashes or lesions, cap refill < 2 seconds Extremities: warm and well perfused, normal tone  Laboratory:  Recent Labs Lab 09/11/16 1028 09/12/16 0411 09/12/16 1226 09/13/16 0334  WBC 1.7* 1.6*  --  1.4*  HGB 7.5* 6.7* 9.0* 7.5*  HCT 23.0* 20.4* 27.4* 22.8*  PLT 93* 84*  --  86*    Recent Labs Lab 09/09/16 1937 09/10/16 0436 09/11/16 0708 09/12/16 0411 09/13/16 0334  NA 128* 129* 131* 132* 134*  K 2.7* 3.3* 3.3* 3.2* 3.9  CL 89* 92* 96* 100* 101  CO2 21* 22 25 24 25   BUN 16 17 10 10 9   CREATININE 1.47* 1.48* 1.03 0.95 0.94  CALCIUM 6.2* 6.1* 6.6* 6.8* 7.2*  PROT 7.0 6.1*  --   --   --   BILITOT 1.9* 3.1*  --   --   --   ALKPHOS 138* 119  --   --   --   ALT 26 22  --   --   --   AST 144* 125*  --   --   --   GLUCOSE 103* 107* 112* 95 85   Liver Function Tests: No results for input(s): AST, ALT, ALKPHOS, BILITOT, PROT, ALBUMIN in the last 72 hours. No results for input(s): LIPASE, AMYLASE in the last 72 hours. Anemia Panel: No results for input(s): VITAMINB12, FOLATE, FERRITIN, TIBC, IRON, RETICCTPCT in the last 72 hours. Urinalysis: No results for input(s): COLORURINE, LABSPEC, PHURINE, GLUCOSEU, HGBUR, BILIRUBINUR, KETONESUR, PROTEINUR, UROBILINOGEN, NITRITE, LEUKOCYTESUR in the last 72 hours.  Invalid input(s): APPERANCEUR  Imaging/Diagnostic Tests: EXAM 9/15: ABDOMEN ULTRASOUND COMPLETE  IMPRESSION: 1. No acute abnormality seen to explain the patient's symptoms. 2. Changes of hepatic cirrhosis noted. Evaluation for underlying hepatic mass is limited on ultrasound. Depending on the degree  of clinical concern, dynamic liver protocol MRI or CT could be considered for further evaluation. 3. Cholelithiasis, with echogenic sludge in the gallbladder. No evidence for obstruction or cholecystitis. 4. Suspect mild splenic varices.  EXAM 9/16: CT ABDOMEN WITH CONTRAST IMPRESSION: 1. Cirrhosis without focal hepatic lesion. Splenomegaly. Trace ascites and mesenteric edema. 2. Cholelithiasis without gallbladder inflammation. 3. Mild colonic wall thickening involving the ascending and descending colon is nonspecific, can be seen with portal enteropathy versus mild colitis of infectious or inflammatory etiology. 4. Abdominal aortic atherosclerosis.  EGD 9/19: Pending    Harriet Butte, DO PGY-I Family Medicine Resident Pager# [phone] 09/13/2016, 8:53 AM

## 2016-09-14 ENCOUNTER — Encounter (HOSPITAL_COMMUNITY): Payer: Self-pay | Admitting: Gastroenterology

## 2016-09-14 LAB — CBC
HEMATOCRIT: 22.4 % — AB (ref 39.0–52.0)
HEMOGLOBIN: 7.2 g/dL — AB (ref 13.0–17.0)
MCH: 30.6 pg (ref 26.0–34.0)
MCHC: 32.1 g/dL (ref 30.0–36.0)
MCV: 95.3 fL (ref 78.0–100.0)
Platelets: 74 10*3/uL — ABNORMAL LOW (ref 150–400)
RBC: 2.35 MIL/uL — ABNORMAL LOW (ref 4.22–5.81)
RDW: 21.1 % — AB (ref 11.5–15.5)
WBC: 1.4 10*3/uL — CL (ref 4.0–10.5)

## 2016-09-14 LAB — BASIC METABOLIC PANEL
ANION GAP: 7 (ref 5–15)
BUN: 8 mg/dL (ref 6–20)
CO2: 24 mmol/L (ref 22–32)
Calcium: 7.8 mg/dL — ABNORMAL LOW (ref 8.9–10.3)
Chloride: 104 mmol/L (ref 101–111)
Creatinine, Ser: 0.85 mg/dL (ref 0.61–1.24)
GFR calc Af Amer: >60 mL/min (ref >60)
GLUCOSE: 79 mg/dL (ref 65–99)
POTASSIUM: 4 mmol/L (ref 3.5–5.1)
Sodium: 135 mmol/L (ref 135–145)

## 2016-09-14 LAB — CORTISOL-AM, BLOOD: Cortisol - AM: 6.2 ug/dL — ABNORMAL LOW (ref 6.7–22.6)

## 2016-09-14 LAB — GIARDIA/CRYPTOSPORIDIUM EIA
Cryptosporidium EIA: NEGATIVE
Giardia Ag, Stl: NEGATIVE

## 2016-09-14 LAB — METHYLMALONIC ACID, SERUM: Methylmalonic Acid, Quantitative: 186 nmol/L (ref 0–378)

## 2016-09-14 LAB — PHOSPHORUS: Phosphorus: 2.5 mg/dL (ref 2.5–4.6)

## 2016-09-14 LAB — FECAL LACTOFERRIN, QUANT: LACTOFERRIN, FECAL, QUANT.: 5.95 ug/mL (ref 0.00–7.24)

## 2016-09-14 LAB — MAGNESIUM: Magnesium: 1.6 mg/dL — ABNORMAL LOW (ref 1.7–2.4)

## 2016-09-14 MED ORDER — MAGNESIUM SULFATE 2 GM/50ML IV SOLN
2.0000 g | Freq: Once | INTRAVENOUS | Status: AC
  Administered 2016-09-14: 2 g via INTRAVENOUS
  Filled 2016-09-14: qty 50

## 2016-09-14 MED ORDER — VITAMIN D (ERGOCALCIFEROL) 1.25 MG (50000 UNIT) PO CAPS: 50000.0000 [IU] | ORAL_CAPSULE | ORAL | 0 refills | Status: DC

## 2016-09-14 MED ORDER — SODIUM PHOSPHATES 45 MMOLE/15ML IV SOLN
10.0000 mmol | Freq: Once | INTRAVENOUS | Status: AC
  Administered 2016-09-14: 10 mmol via INTRAVENOUS
  Filled 2016-09-14: qty 3.33

## 2016-09-14 MED ORDER — VITAMIN D (ERGOCALCIFEROL) 1.25 MG (50000 UNIT) PO CAPS
50000.0000 [IU] | ORAL_CAPSULE | ORAL | Status: DC
  Administered 2016-09-14: 50000 [IU] via ORAL
  Filled 2016-09-14: qty 1

## 2016-09-14 NOTE — Progress Notes (Signed)
Nutrition Follow-up  DOCUMENTATION CODES:   Non-severe (moderate) malnutrition in context of chronic illness  INTERVENTION:   Continue Boost Breeze oral nutrition supplement TID between meals. Each supplement provides 250 kcal and 9 grams protein.  Monitor PO intake.  NUTRITION DIAGNOSIS:   Malnutrition related to chronic illness as evidenced by energy intake < 75% for > or equal to 1 month, moderate depletion of body fat, moderate depletions of muscle mass.  Ongoing  GOAL:   Patient will meet greater than or equal to 90% of their needs  Progressing  MONITOR:   PO intake, Supplement acceptance, Labs, Weight trends  REASON FOR ASSESSMENT:   Consult, Malnutrition Screening Tool Assessment of nutrition requirement/status  ASSESSMENT:   55 y.o. male presenting with decreased appetite found to have symptomatic anemia. PMH is significant for chronic anemia, alcoholic cirrhosis, esophageal varices, MGUS, HTN, CKD3.  Spoke with pt at bedside who reports good appetite and good tolerance of the Boost Breeze oral nutrition supplement. Pt denies any GI upset and praises food provided on lunch tray.  Per chart, pt consuming 0-100% of meals.  Labs reviewed and include low calcium (7.8), low magnesium (1.6); sodium, chloride, potassium, WNL Medications reviewed and include folic acid, MVI with minerals, thiamine, vitamin D  Diet Order:  Diet regular Room service appropriate? Yes; Fluid consistency: Thin  Skin:  Reviewed, no issues  Last BM:  09/12/16  Height:   Ht Readings from Last 1 Encounters:  09/10/16 5\' 11"  (1.803 m)    Weight:   Wt Readings from Last 1 Encounters:  09/12/16 175 lb 8 oz (79.6 kg)    Ideal Body Weight:  78.18 kg  BMI:  Body mass index is 24.48 kg/m.  Estimated Nutritional Needs:   Kcal:  2000-2200  Protein:  80-90 grams  Fluid:  2.2 L/day  EDUCATION NEEDS:   No education needs identified at this time  Jeb Levering Dietetic  Intern Pager Number: [phone]

## 2016-09-14 NOTE — Progress Notes (Signed)
No complaints today. Patient states he has a good appetite. EGD findings reviewed. No further GI tests planned at this point. We will sign off. Call us if needed.

## 2016-09-14 NOTE — Progress Notes (Signed)
Family Medicine Teaching Service Daily Progress Note Intern Pager: [phone]  Patient name: William [name] Medical record number: [id number] Date of birth: 03/30/1961 Age: 55 y.o. Gender: male  Primary Care Provider: Dimas Chyle, MD Consultants: None Code Status: Full Code  Pt Overview and Major Events to Date:  9/15: admission for weakness with anemia, weight loss, and low appetite, transfused 2 units PRBCs 9/18: transfused 1 units PRBCs 9/19: EGD evaluation by GI for unexplained weight loss of 6 months, showed grade 1 esophogeal varices otherwise neg  Assessment and Plan: William [name] is a 55 y.o. male presenting with decreased appetite found to have symptomatic anemia. PMH is significant for chronic anemia, alcoholic cirrhosis, esophageal varices, concern for MGUS, HTN, CKD3.  1. Anorexia, Unintentional Weight Loss: H/o alcoholic cirrhosis. No Hx of gastritis is known. He does have cholelithiasis without inflammation but no biliary colic s/s on ROS. Does report complaints of acid reflux so gastritis is a possibility. Abdominal CT showed colonic wall thickening probably related to his chronic watery diarrhea but no malignancy. Nutrion consult rec advanced diet with consideration of supplements, weight trends. GI panel neg for pathogens. - Tolerating reglan, consider thorazine for hiccups if they return - Giardia/crypto neg - EGD 9/19, recs to continue regular diet and medication - AM cortisol pending 9/20 - Rectal and prostate exam neg for nodules  2. Acute on Chronic Pancytopenia/Anemia:: His anemia remains stable today. Abdominal CT revealed no obvious malignancy.Marland Kitchen HIV nonreactive. Broad differential.. S/p 1 unit PRBC today 9/18. B12 wnl. Low haptoglobin. - Transfuse as needed to goal Hgb >7, received 1 unit with great response on 9/18 AM 2/2 Hgb 6.7>9.0 - Folate pending  3. Electrolyte derangements: Hypokalemia/hypocalcemia/hypomagnesemia/hypophosphatemia :Most electrolytes  normalizing. with treatment and continue replacement. Vitamin D 25 hydroxy low at 5.3. - Continue trending CMET, Mg, and Phos, if low will consider supplemeting - Consider further workup with PTH - Thiamine 200mg  PO daily - Reordered Mg and Phos supplement IV 9/20 for low levels - Vit D low at 5.3, will provide 50,000 units q7 days starting 9/20  4. Alcoholic cirrhosis with history of esophageal varices: Child Class A. He is on propranolol for variceal hemorrhage prophylaxis. Also on spironolactone at baseline. No lesions seen on CT. We will rescreen hep C since rarely is isolated, stable cirrhosis a major cause of worsening anemia. - Continue propranolol 20mg  BID  5. MGUS: Initial findings showing low urine protein suggesting against a proliferative gammopathy. However kappa/lamda light chain assay are both elevated.  SPEP shows increased IgA consistent with previous 3-4 years. M-protein not observed. Kappa and lambda chains elevated 117.1 and 59.2 respectively. Kappa up from 3.19 4 years ago. Light chain ration kappa to lambda elevated at 1.98. - May need outpt f/u with oncology for MGUS  6. HTN: BP 100s/70s, seems somewhat close to baseline - Continue propranolol though primarily for esophogeal varices - Restarting spironolactone on 9/19  7. AKI on CKD3: resolved, Cr improved to 0.94 so most likely this was a pre-renal deficit that is improved with fluids. - Giving IV fluids as above - Follow daily metabolic panel  8. Gout: Continue home allopurinol 100mg  daily. Hold home colchicine.  FEN/GI: Renal diet Prophylaxis: SCDs  Disposition: Pending further evaluation  Subjective:  Doing well, laying in bed. No complaints s/o EGD. Passing gas with last bowel movement 9/19. No new complaints this morning. No abdominal pain or nausea. Says he has an appetite, waiting for breakfast.  Objective: Temp:  [97.8 F (36.6  C)-98.8 F (37.1 C)] 98.8 F (37.1 C) (09/20 0852) Pulse Rate:   [85-88] 87 (09/20 0852) Resp:  [16-24] 16 (09/20 0852) BP: (97-125)/(68-87) 125/87 (09/20 0852) SpO2:  [100 %] 100 % (09/20 0852) Physical Exam: General: resting comfortably in no acute distress with non-toxic appearance HEENT: normocephalic, atraumatic, moist mucous membranes Neck: supple, non-tender without lymphadenopathy CV: regular rate and rhythm without murmurs, rubs, or gallops Lungs: clear to auscultation bilaterally with normal work of breathing Abdomen: soft, non-tender, no masses or organomegaly palpable, normoactive bowel sounds Skin: warm, dry, no rashes or lesions, cap refill < 2 seconds Extremities: warm and well perfused, normal tone Rectal: no enlargement of prostate or nodules appreciated, brown stool without blood  Laboratory:  Recent Labs Lab 09/13/16 0334 09/13/16 1640 09/14/16 0811  WBC 1.4* 1.8* 1.4*  HGB 7.5* 8.1* 7.2*  HCT 22.8* 25.1* 22.4*  PLT 86* 86* 74*    Recent Labs Lab 09/09/16 1937 09/10/16 0436  09/12/16 0411 09/13/16 0334 09/14/16 0811  NA 128* 129*  < > 132* 134* 135  K 2.7* 3.3*  < > 3.2* 3.9 4.0  CL 89* 92*  < > 100* 101 104  CO2 21* 22  < > 24 25 24   BUN 16 17  < > 10 9 8   CREATININE 1.47* 1.48*  < > 0.95 0.94 0.85  CALCIUM 6.2* 6.1*  < > 6.8* 7.2* 7.8*  PROT 7.0 6.1*  --   --   --   --   BILITOT 1.9* 3.1*  --   --   --   --   ALKPHOS 138* 119  --   --   --   --   ALT 26 22  --   --   --   --   AST 144* 125*  --   --   --   --   GLUCOSE 103* 107*  < > 95 85 79  < > = values in this interval not displayed. Liver Function Tests: No results for input(s): AST, ALT, ALKPHOS, BILITOT, PROT, ALBUMIN in the last 72 hours.  Recent Labs  09/13/16 1640  AMYLASE 96   Anemia Panel: No results for input(s): VITAMINB12, FOLATE, FERRITIN, TIBC, IRON, RETICCTPCT in the last 72 hours. Urinalysis: No results for input(s): COLORURINE, LABSPEC, PHURINE, GLUCOSEU, HGBUR, BILIRUBINUR, KETONESUR, PROTEINUR, UROBILINOGEN, NITRITE,  LEUKOCYTESUR in the last 72 hours.  Invalid input(s): APPERANCEUR  Imaging/Diagnostic Tests: EXAM 9/15: ABDOMEN ULTRASOUND COMPLETE  IMPRESSION: 1. No acute abnormality seen to explain the patient's symptoms. 2. Changes of hepatic cirrhosis noted. Evaluation for underlying hepatic mass is limited on ultrasound. Depending on the degree of clinical concern, dynamic liver protocol MRI or CT could be considered for further evaluation. 3. Cholelithiasis, with echogenic sludge in the gallbladder. No evidence for obstruction or cholecystitis. 4. Suspect mild splenic varices.  EXAM 9/16: CT ABDOMEN WITH CONTRAST IMPRESSION: 1. Cirrhosis without focal hepatic lesion. Splenomegaly. Trace ascites and mesenteric edema. 2. Cholelithiasis without gallbladder inflammation. 3. Mild colonic wall thickening involving the ascending and descending colon is nonspecific, can be seen with portal enteropathy versus mild colitis of infectious or inflammatory etiology. 4. Abdominal aortic atherosclerosis.  EGD 9/19: Pending - Grade I esophageal varices. - Normal stomach. - Normal examined duodenum. - No specimens collected.    Harriet Butte, DO PGY-I Family Medicine Resident Pager# [phone] 09/14/2016, 9:12 AM

## 2016-09-14 NOTE — Progress Notes (Signed)
Patient Discharge: Disposition: Patient discharged to home. Education:  Reviewed medications, prescriptions, follow-up appointments and discharge instructions, understood and acknowledged.  IV: Discontinued IV before discharge. Telemetry: Dsicontinued before discharge, CCMD notified. Transportation: Patient escorted out of the unit by the staff and family. Belongings: Patient took all his belongings with him.

## 2016-09-14 NOTE — Discharge Summary (Signed)
[facility] Discharge Summary  Patient name: William [name] Medical record number: [id number] Date of birth: October 11, 1961 Age: 55 y.o. Gender: male Date of Admission: 09/09/2016  Date of Discharge: 09/14/16 Admitting Physician: Blane Ohara McDiarmid, MD  Primary Care Provider: Dimas Chyle, MD Consultants: Gastroenterology  Indication for Hospitalization: Unintentional Weight Loss with Decreased Appetite   Discharge Diagnoses/Problem List:  Anorexia, Unintentional Weight Loss Acute on Chronic Pancytopenia/Anemia Electrolyte derangements Alcoholic cirrhosis with history of esophageal varices MGUS HTN CKD stage III  Disposition: Home  Discharge Condition: Stable, improved  Discharge Exam:  General: resting comfortably in no acute distress with non-toxic appearance HEENT: normocephalic, atraumatic, moist mucous membranes Neck: supple, non-tender without lymphadenopathy CV: regular rate and rhythm without murmurs, rubs, or gallops Lungs: clear to auscultation bilaterally with normal work of breathing Abdomen: soft, non-tender, no masses or organomegaly palpable, normoactive bowel sounds Skin: warm, dry, no rashes or lesions, cap refill < 2 seconds Extremities: warm and well perfused, normal tone Rectal: no enlargement of prostate or nodules appreciated, brown stool without blood  Brief Hospital Course:  William Rocks Miltonis a 55 y.o.malepresenting with decreased appetite found to have symptomatic anemia. PMH is significant for chronic anemia, alcoholic cirrhosis, esophageal varices, concern for MGUS, HTN, CKD3.  Patient presented with history of deceased appetite of 6 months duration with a 15 lb weight loss over the past week and a half. He failed to take his medications during this time. He c/o generalized weakness and fatigue and was only able to ambulate a few yards. He endorsed intermittent nausea and vomiting over the past week and a half with non-bloody  emesis. He has had loose stools for the past 2 weeks with up to 2 BM per day. Denied steatorrhea, hematochezia or melena. Denies NSAID or blood thinners. Last blood transfusion 5 years ago which he reports was due to his cirrhosis. He had an EGD and colonoscopy at that time which he reports normal.   Patient was found to be pancytopenic with hgb of 5.7 and was transfused 3 units of pRBC. Hgb remained stable after transfusions and upon discharge. There were also several electrolyte abnormalities which we restored. His symptomatic anemia resolved during his admission. GI was consulted and performed an EGD which showed no new findings, just grade 1 esophogeal varices.   MGUS work up showed increase in kappa chains with slight increase in IgA w/o M-protein spike. Due to patients lack of f/u with Dr Alen Blew, patient should be referred for reassessment of his status.  He regained his appetite and was able to eat prior to discharge. GI did not recommend further workup and signed off. Hgb was stable and patient was discharge. Will need creatinine, hgb, and hematology f/u.  Issues for Follow Up:  1. Check hgb level on PCP f/u 9/22 2. Needs referral for hematology for MGUS and pancytopenia, no notes since 2014? 3. Vitamin D deficiency, needs to continue 50,000 units q7 days until level >30 4. AKI during admission but resolved, consider rechecking outpatient  5. Decreased history eating warrants further outpatient work up, likely 2/2 malnutrition, possibly malignancy but less likely given h/o alcoholic cirrhosis 6. EGD showed grade 1 esophogeal varices, otherwise neg, continue propranolol  Significant Procedures:  09/09/16: transfused 2 units PRBCs 09/12/16: transfused 1 unit PRBCs 09/13/16: EGD  Significant Labs and Imaging:   Recent Labs Lab 09/13/16 0334 09/13/16 1640 09/14/16 0811  WBC 1.4* 1.8* 1.4*  HGB 7.5* 8.1* 7.2*  HCT 22.8* 25.1* 22.4*  PLT 86* 86* 74*    Recent Labs Lab 09/09/16 1937  09/10/16 0436 09/10/16 0441 09/11/16 0534 09/11/16 0708 09/12/16 0411 09/13/16 0334 09/13/16 1640 09/14/16 0811  NA 128* 129*  --   --  131* 132* 134*  --  135  K 2.7* 3.3*  --   --  3.3* 3.2* 3.9  --  4.0  CL 89* 92*  --   --  96* 100* 101  --  104  CO2 21* 22  --   --  25 24 25   --  24  GLUCOSE 103* 107*  --   --  112* 95 85  --  79  BUN 16 17  --   --  10 10 9   --  8  CREATININE 1.47* 1.48*  --   --  1.03 0.95 0.94  --  0.85  CALCIUM 6.2* 6.1*  --   --  6.6* 6.8* 7.2*  --  7.8*  MG  --   --  0.8* 1.7  --  1.3*  --  1.2* 1.6*  PHOS  --   --   --  <1.0*  --  1.1*  --  1.5* 2.5  ALKPHOS 138* 119  --   --   --   --   --   --   --   AST 144* 125*  --   --   --   --   --   --   --   ALT 26 22  --   --   --   --   --   --   --   ALBUMIN 3.1* 2.8*  --   --   --   --   --   --   --    EtOH:  wnl Lipase:  108 elevated Troponin:  wnl APAP:  wnl ASA:  wnl PT/INR:  15.3 elevated/1.2 Path Smear:  Pancytopenia  Fecal occult blood:  Neg Ionized calcium:  3.0 low UA:  Neg TSH:  wnl Folate:  wnl HIV:  Non-reactive B12:  wnl Ferritin:  2754 elevated Retic ct:  wnl MMA:  186 Vit D OH:  5.3 low HCV:  <0.1 Lactoferrin:  5.95 Giardia/crypto:  Neg Haptoglobin: <10 PSA:  0.37 Amylase:  96 AM Cortisol:  6.2  Results/Tests Pending at Time of Discharge: None  Discharge Medications:    Medication List    TAKE these medications   allopurinol 100 MG tablet Commonly known as:  ZYLOPRIM TAKE ONE TABLET BY MOUTH ONCE DAILY   baclofen 10 MG tablet Commonly known as:  LIORESAL TAKE ONE TABLET BY MOUTH THREE TIMES DAILY AS NEEDED FOR MUSCLE SPASMS   colchicine 0.6 MG tablet Commonly known as:  COLCRYS Take 0.5 tablets (0.3 mg total) by mouth daily.   diclofenac sodium 1 % Gel Commonly known as:  VOLTAREN Apply 2 g topically 4 (four) times daily.   folic acid 1 MG tablet Commonly known as:  FOLVITE Take 1 tablet (1 mg total) by mouth daily.   gabapentin 100 MG  capsule Commonly known as:  NEURONTIN Take 1 capsule (100 mg total) by mouth 3 (three) times daily.   IRON PO Take 1 tablet by mouth daily.   omeprazole 40 MG capsule Commonly known as:  PRILOSEC TAKE ONE CAPSULE BY MOUTH ONCE DAILY   propranolol 40 MG tablet Commonly known as:  INDERAL TAKE ONE TABLET BY MOUTH TWICE DAILY   spironolactone 25 MG tablet Commonly known as:  ALDACTONE Take 1 tablet (25 mg total) by mouth daily.   traMADol 50 MG tablet Commonly known as:  ULTRAM Take 1 tablet (50 mg total) by mouth every 8 (eight) hours as needed.   Vitamin D (Ergocalciferol) 50000 units Caps capsule Commonly known as:  DRISDOL Take 1 capsule (50,000 Units total) by mouth every 7 (seven) days.       Discharge Instructions: Please refer to Patient Instructions section of EMR for full details.  Patient was counseled important signs and symptoms that should prompt return to medical care, changes in medications, dietary instructions, activity restrictions, and follow up appointments.   Follow-Up Appointments: Follow-up Information    Dimas Chyle, MD. Go on 09/16/2016.   Specialty:  Family Medicine Why:  Go to appointment at 11:30 AM Contact information: [id number] [id number] [place], DO 09/14/2016, 6:26 PM PGY-1, [place]

## 2016-09-14 NOTE — Discharge Instructions (Signed)
William [name], you were admitted for unexplained weight loss for 6 months with decreased appetite and weakness. Because of the uncertainty of you weight loss GI was consulted and performed an endoscopy to evaluate your throat, esophagus, and stomach. Findings showed esophogeal varices but no new findings.   You also presented to [place] with severe anemia requiring 3 blood transfusion but was stable upon discharge. You are to follow up with a recheck of your hemoglobin on 09/16/16 with you PCP.  Other problems included low vitamin D which we started you on a large dose of 50,000 which you will take once per week.   You have a history of MGUS and labs indicated you may need to see hematology out patient for follow up.  We scheduled you follow up for your hospitalization with your PCP on 09/16/2016 at 11:30 AM.

## 2016-09-16 ENCOUNTER — Ambulatory Visit (INDEPENDENT_AMBULATORY_CARE_PROVIDER_SITE_OTHER): Payer: Commercial Managed Care - HMO | Admitting: Family Medicine

## 2016-09-16 ENCOUNTER — Encounter: Payer: Self-pay | Admitting: Family Medicine

## 2016-09-16 VITALS — BP 108/77 | HR 90 | Temp 100.1°F | Wt 175.0 lb

## 2016-09-16 DIAGNOSIS — R319 Hematuria, unspecified: Secondary | ICD-10-CM

## 2016-09-16 DIAGNOSIS — D649 Anemia, unspecified: Secondary | ICD-10-CM | POA: Diagnosis not present

## 2016-09-16 DIAGNOSIS — D472 Monoclonal gammopathy: Secondary | ICD-10-CM | POA: Diagnosis not present

## 2016-09-16 DIAGNOSIS — R630 Anorexia: Secondary | ICD-10-CM

## 2016-09-16 DIAGNOSIS — R8299 Other abnormal findings in urine: Secondary | ICD-10-CM

## 2016-09-16 DIAGNOSIS — R82998 Other abnormal findings in urine: Secondary | ICD-10-CM

## 2016-09-16 DIAGNOSIS — D61818 Other pancytopenia: Secondary | ICD-10-CM

## 2016-09-16 DIAGNOSIS — N179 Acute kidney failure, unspecified: Secondary | ICD-10-CM | POA: Diagnosis not present

## 2016-09-16 LAB — CBC
HCT: 24.7 % — ABNORMAL LOW (ref 38.5–50.0)
HEMOGLOBIN: 8.2 g/dL — AB (ref 13.2–17.1)
MCH: 30.7 pg (ref 27.0–33.0)
MCHC: 33.2 g/dL (ref 32.0–36.0)
MCV: 92.5 fL (ref 80.0–100.0)
MPV: 11 fL (ref 7.5–12.5)
PLATELETS: 99 10*3/uL — AB (ref 140–400)
RBC: 2.67 MIL/uL — ABNORMAL LOW (ref 4.20–5.80)
RDW: 19 % — ABNORMAL HIGH (ref 11.0–15.0)
WBC: 2.2 10*3/uL — ABNORMAL LOW (ref 3.8–10.8)

## 2016-09-16 LAB — POCT URINALYSIS DIPSTICK
GLUCOSE UA: NEGATIVE
KETONES UA: 15
Leukocytes, UA: NEGATIVE
Nitrite, UA: POSITIVE
Protein, UA: 100
RBC UA: NEGATIVE
SPEC GRAV UA: 1.015
UROBILINOGEN UA: 2
pH, UA: 5.5

## 2016-09-16 NOTE — Patient Instructions (Addendum)
We will check your blood work today.  I am glad that your appetite is better.  We will refer you to a hematologist. We are not sure why your blood counts were so low.  We should recheck your urine in the next few weeks.   Come back to see me within the next 2-4 weeks.   Take care,  Dr Jerline Pain

## 2016-09-16 NOTE — Assessment & Plan Note (Addendum)
UA today negative for RBCs, though did have a large amount of bilirubin, likely secondary to patient's chronic cirrhosis. He did also have some proteinuria which may be related to his recent AKI. Will followup in 2-3 weeks. Will likely need referral to GI for monitoring of his cirrhosis.

## 2016-09-16 NOTE — Assessment & Plan Note (Signed)
Improved. Work up during his admission unremarkable including CT scan and endoscopy. There is some concern that he may have an underlying malignancy given his pancytopenia - patient will be referred to heme-onc. If appetite worsens, can consider addition of remeron. Follow up 2-4 weeks.

## 2016-09-16 NOTE — Progress Notes (Signed)
    Subjective:  William [name] is a 55 y.o. male who presents to the [facility] today with a chief complaint of hospital follow up.   HPI:  Normocytic Anemia / Pancytopenia Patient admitted to the hospital 7 days ago with symptomatic anemia and an Hgb of 5.7. He was also found to be pancytopenic with low WBC and low platelets. He underwent a GI work up which showed no new findings, just grade 1 esophageal varices. He additionally underwent MGUS work up (he has been diagnosed with this in the past), which showed increased kappa chains in IgA without an M-protein spike.  Dark urine Patient started noticing dark urine yesterday. Worried that it may be blood. He had mild back pain yesterday. He has a history of kidney stones.   Decreased Appetite Patient was noted to have went 1 week without eating at his last appointment 7 days ago. During his hospitalization his appetite improved. He now reports a healthy appetite and is not having difficulty keeping down solid foods.   ROS: Per HPI  PMH: Smoking history reviewed.    Objective:  Physical Exam: BP 108/77   Pulse 90   Temp 100.1 F (37.8 C) (Oral)   Wt 175 lb (79.4 kg)   SpO2 93%   BMI 24.41 kg/m   Gen: NAD, resting comfortably CV: RRR with no murmurs appreciated Pulm: NWOB, CTAB with no crackles, wheezes, or rhonchi MSK: no edema, cyanosis, or clubbing noted. No CVA tenderness. Skin: warm, dry Neuro: grossly normal, moves all extremities Psych: Normal affect and thought content  Urinalysis    Component Value Date/Time   COLORURINE AMBER (A) 09/10/2016 0203   APPEARANCEUR CLEAR 09/10/2016 0203   LABSPEC 1.013 09/10/2016 0203   PHURINE 6.0 09/10/2016 0203   GLUCOSEU NEGATIVE 09/10/2016 0203   HGBUR NEGATIVE 09/10/2016 0203   BILIRUBINUR LARGE 09/16/2016 1200   KETONESUR 15 (A) 09/10/2016 0203   PROTEINUR 100 09/16/2016 1200   PROTEINUR 30 (A) 09/10/2016 0203   UROBILINOGEN 2.0 09/16/2016 1200   UROBILINOGEN 1.0 06/09/2010  1322   NITRITE POSITIVE 09/16/2016 1200   NITRITE NEGATIVE 09/10/2016 0203   LEUKOCYTESUR Negative 09/16/2016 1200      Assessment/Plan:  Pancytopenia ([place]) Will recheck CBC today. Last CBC significant for WBC 1.4, Hgb7.2, and platelets 72. Patient has a history of MGUS, unclear how much this is contributing to his pancytopenia. Will refer to heme-onc.   Decreased appetite Improved. Work up during his admission unremarkable including CT scan and endoscopy. There is some concern that he may have an underlying malignancy given his pancytopenia - patient will be referred to heme-onc. If appetite worsens, can consider addition of remeron. Follow up 2-4 weeks.   Dark urine UA today negative for RBCs, though did have a large amount of bilirubin, likely secondary to patient's chronic cirrhosis. He did also have some proteinuria which may be related to his recent AKI. Will followup in 2-3 weeks. Will likely need referral to GI for monitoring of his cirrhosis.   Algis Greenhouse. Jerline Pain, [place] [facility] Resident PGY-3 09/16/2016 12:24 PM

## 2016-09-16 NOTE — Assessment & Plan Note (Signed)
Will recheck CBC today. Last CBC significant for WBC 1.4, Hgb7.2, and platelets 72. Patient has a history of MGUS, unclear how much this is contributing to his pancytopenia. Will refer to heme-onc.

## 2016-09-17 LAB — BASIC METABOLIC PANEL WITH GFR
BUN: 10 mg/dL (ref 7–25)
CALCIUM: 8.5 mg/dL — AB (ref 8.6–10.3)
CO2: 19 mmol/L — AB (ref 20–31)
CREATININE: 0.99 mg/dL (ref 0.70–1.33)
Chloride: 100 mmol/L (ref 98–110)
GFR, Est African American: >89 mL/min (ref >=60)
GFR, Est Non African American: 84 mL/min (ref >=60)
Glucose, Bld: 97 mg/dL (ref 65–99)
Potassium: 4.5 mmol/L (ref 3.5–5.3)
SODIUM: 130 mmol/L — AB (ref 135–146)

## 2016-09-20 ENCOUNTER — Other Ambulatory Visit: Payer: Self-pay | Admitting: Family Medicine

## 2016-09-20 ENCOUNTER — Telehealth: Payer: Self-pay | Admitting: Family Medicine

## 2016-09-20 DIAGNOSIS — E871 Hypo-osmolality and hyponatremia: Secondary | ICD-10-CM

## 2016-09-20 NOTE — Telephone Encounter (Signed)
Called patient to discuss results. Sodium is lower, but other results stable compared to previous. Will recheck BMp in 2-3 weeks.  Algis Greenhouse. Jerline Pain, [place] Resident PGY-3 09/20/2016 10:30 AM

## 2016-10-12 ENCOUNTER — Encounter: Payer: Self-pay | Admitting: *Deleted

## 2016-10-13 ENCOUNTER — Telehealth: Payer: Self-pay | Admitting: *Deleted

## 2016-10-13 ENCOUNTER — Ambulatory Visit (HOSPITAL_BASED_OUTPATIENT_CLINIC_OR_DEPARTMENT_OTHER): Payer: Commercial Managed Care - HMO | Admitting: Oncology

## 2016-10-13 VITALS — BP 122/82 | HR 88 | Temp 98.0°F | Resp 17 | Ht 71.0 in | Wt 168.7 lb

## 2016-10-13 DIAGNOSIS — K7460 Unspecified cirrhosis of liver: Secondary | ICD-10-CM | POA: Diagnosis not present

## 2016-10-13 DIAGNOSIS — D696 Thrombocytopenia, unspecified: Secondary | ICD-10-CM | POA: Diagnosis not present

## 2016-10-13 DIAGNOSIS — D890 Polyclonal hypergammaglobulinemia: Secondary | ICD-10-CM

## 2016-10-13 DIAGNOSIS — D649 Anemia, unspecified: Secondary | ICD-10-CM

## 2016-10-13 DIAGNOSIS — D72819 Decreased white blood cell count, unspecified: Secondary | ICD-10-CM

## 2016-10-13 DIAGNOSIS — D638 Anemia in other chronic diseases classified elsewhere: Secondary | ICD-10-CM

## 2016-10-13 NOTE — Telephone Encounter (Signed)
"  My car won't start.  Waiting for someone to bring me a jump.  Should I come on in.  I should be there shortly after 10:00."   Advised to come per collaborative nurse.

## 2016-10-13 NOTE — Progress Notes (Signed)
Hematology and Oncology Follow Up Visit  William [name] [id number] [birth year] 55 y.o. 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 This is a chronic finding and reactive in nature related to to his liver disease.   Current therapy: Observation and surveillance  Interim History: William [name] presents today for a followup visit. 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 He has not been seen in clinic since that time and have failed to follow-up since initial appointments. His a pleasant gentleman with cirrhosis of the liver and cytopenias related to that. He has been in reasonably stable health when he was hospitalized in September 2017 for worsening anemia and failure to thrive. He received packed red cell transfusions and his GI workup did not reveal any active bleeding. Since his discharge, he feels reasonably fair and have resumed most activities of daily living. He is ambulating without any difficulties and have not had any falls or syncope.  He does not report any headaches, blurry vision or seizures. He does not report any fevers, chills or sweats. He does not report any cough, wheezing or hemoptysis. He is not reporting chest pain, palpitation or leg edema. He does not report any nausea, vomiting or abdominal pain. He does not report any frequency urgency or hesitancy. He does not report any skeletal complaints. Remaining review of systems unremarkable.  Medications: I have reviewed the patient's current medications.  Current Outpatient Prescriptions:  .  allopurinol (ZYLOPRIM) 100 MG tablet, TAKE ONE TABLET BY MOUTH ONCE DAILY, Disp: 30 tablet, Rfl: 5 .  baclofen (LIORESAL) 10 MG tablet, TAKE ONE TABLET BY MOUTH THREE TIMES DAILY AS NEEDED FOR MUSCLE SPASMS, Disp: 30 tablet, Rfl: 3 .  colchicine (COLCRYS) 0.6 MG tablet, Take 0.5 tablets (0.3 mg total)  by mouth daily., Disp: 30 tablet, Rfl: 3 .  diclofenac sodium (VOLTAREN) 1 % GEL, Apply 2 g topically 4 (four) times daily., Disp: 123XX123 g, Rfl: 5 .  folic acid (FOLVITE) 1 MG tablet, TAKE ONE TABLET BY MOUTH DAILY, Disp: 90 tablet, Rfl: 3 .  gabapentin (NEURONTIN) 100 MG capsule, Take 1 capsule (100 mg total) by mouth 3 (three) times daily., Disp: 90 capsule, Rfl: 0 .  IRON PO, Take 1 tablet by mouth daily., Disp: , Rfl:  .  omeprazole (PRILOSEC) 40 MG capsule, TAKE ONE CAPSULE BY MOUTH ONCE DAILY, Disp: 30 capsule, Rfl: 11 .  propranolol (INDERAL) 40 MG tablet, TAKE ONE TABLET BY MOUTH TWICE DAILY, Disp: 60 tablet, Rfl: 11 .  spironolactone (ALDACTONE) 25 MG tablet, TAKE ONE TABLET BY MOUTH ONCE DAILY, Disp: 30 tablet, Rfl: 5 .  traMADol (ULTRAM) 50 MG tablet, Take 1 tablet (50 mg total) by mouth every 8 (eight) hours as needed., Disp: 30 tablet, Rfl: 2 .  Vitamin D, Ergocalciferol, (DRISDOL) 50000 units CAPS capsule, Take 1 capsule (50,000 Units total) by mouth every 7 (seven) days., Disp: 4 capsule, Rfl: 0  Allergies: No Known Allergies  Past Medical History, Surgical history, Social history, and Family History were reviewed and updated.   Physical Exam: Blood pressure 122/82, pulse 88, temperature 98 F (36.7 C), temperature source Oral, resp. rate 17, height 5\' 11"  (1.803 m), weight 168 lb 11.2 oz (76.5 kg), SpO2 100 %. ECOG: 1 General appearance: alert awake gentleman without distress. Head: Normocephalic, without obvious abnormality Neck: no adenopathy, no thyroid masses. Lymph nodes: Cervical,  supraclavicular, and axillary nodes normal. Heart:regular rate and rhythm, S1, S2 normal, no murmur, click, rub or gallop Lung:chest clear, no wheezing, rales, normal symmetric air entry Abdomin: soft, non-tender, without masses or organomegaly shifting dullness or ascites. EXT:no erythema, induration, or nodules   Lab Results: Lab Results  Component Value Date   WBC 2.2 (L) 09/16/2016    HGB 8.2 (L) 09/16/2016   HCT 24.7 (L) 09/16/2016   MCV 92.5 09/16/2016   PLT 99 (L) 09/16/2016     Chemistry      Component Value Date/Time   NA 130 (L) 09/16/2016 1204   NA 140 03/07/2013 1142   K 4.5 09/16/2016 1204   K 3.9 03/07/2013 1142   CL 100 09/16/2016 1204   CL 105 03/07/2013 1142   CO2 19 (L) 09/16/2016 1204   CO2 22 03/07/2013 1142   BUN 10 09/16/2016 1204   BUN 11.2 03/07/2013 1142   CREATININE 0.99 09/16/2016 1204   CREATININE 1.2 03/07/2013 1142      Component Value Date/Time   CALCIUM 8.5 (L) 09/16/2016 1204   CALCIUM 9.2 03/07/2013 1142   ALKPHOS 119 09/10/2016 0436   ALKPHOS 399 (H) 03/07/2013 1142   AST 125 (H) 09/10/2016 0436   AST 79 (H) 03/07/2013 1142   ALT 22 09/10/2016 0436   ALT 35 03/07/2013 1142   BILITOT 3.1 (H) 09/10/2016 0436   BILITOT 1.22 (H) 03/07/2013 1142     Results for William [name] (MRN [id number]) as of 10/13/2016 09:55  Ref. Range 09/10/2016 04:41  IgG (Immunoglobin G), Serum Latest Ref Range: 700 - 1,600 mg/dL 1,452  IgA Latest Ref Range: 90 - 386 mg/dL 392 (H)  IgM, Serum Latest Ref Range: 20 - 172 mg/dL 146  Kappa free light chain Latest Ref Range: 3.3 - 19.4 mg/L 117.1 (H)  Lamda free light chains Latest Ref Range: 5.7 - 26.3 mg/L 59.2 (H)  Kappa, lamda light chain ratio Latest Ref Range: 0.26 - 1.65  1.98 (H)  M Protein SerPl Elph-Mcnc Latest Ref Range: Not Observed g/dL Not Observed    Impression and Plan: 55 year old gentleman with: 1.  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  His repeat serum protein electrophoresis done on September 2017 and is not different from his previous one in 2014. He continues to have polyclonal elevation which indicates reactive process rather than a plasma cell disorder. This makes MGUS less likely and no intervention is needed from that standpoint.  2. Anemia: Likely multifactorial in nature related to his liver disease although there is no acute GI bleeding noted.  Could be related to sequestration from his splenomegaly and have responded to transfusion properly. See no evidence to suggest hemolysis at this time. His anemia is not related to polyclonal gammopathy or plasma cell disorder.   From a management standpoint, periodic follow-up and supplementing him with iron and occasional transfusion may be needed.  I offered him continuous follow-up but he declined at this time and he said that he would let me know if you need my services in the future.  2.  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  3. Thrombocytopenia: No bleeding noted at this time. His recent platelet count is close to his baseline of 99,000.  4. Leukocytopenia: His most recent count is 2.2 on 09/16/2016. He had a normal differential. This is likely to sequestration from his splenomegaly and  not dramatically different from previous counts.  Follow-up: Happy to see him in the future at any time needed to.     [id number], MD 10/19/201710:48 AM

## 2017-01-04 ENCOUNTER — Other Ambulatory Visit: Payer: Self-pay | Admitting: Family Medicine

## 2017-05-17 ENCOUNTER — Encounter: Payer: Self-pay | Admitting: Internal Medicine

## 2017-05-17 ENCOUNTER — Inpatient Hospital Stay (HOSPITAL_COMMUNITY)
Admission: EM | Admit: 2017-05-17 | Discharge: 2017-05-22 | DRG: 809 | Disposition: A | Discharge status: Home / Self Care | Payer: Medicare PPO | Attending: Family Medicine | Admitting: Family Medicine

## 2017-05-17 ENCOUNTER — Inpatient Hospital Stay (HOSPITAL_COMMUNITY): Payer: Medicare PPO

## 2017-05-17 ENCOUNTER — Ambulatory Visit (INDEPENDENT_AMBULATORY_CARE_PROVIDER_SITE_OTHER): Payer: Medicare PPO | Admitting: Internal Medicine

## 2017-05-17 ENCOUNTER — Encounter (HOSPITAL_COMMUNITY): Payer: Self-pay

## 2017-05-17 VITALS — BP 108/68 | HR 70 | Temp 98.0°F | Wt 162.0 lb

## 2017-05-17 DIAGNOSIS — Z8719 Personal history of other diseases of the digestive system: Secondary | ICD-10-CM

## 2017-05-17 DIAGNOSIS — D649 Anemia, unspecified: Secondary | ICD-10-CM | POA: Diagnosis present

## 2017-05-17 DIAGNOSIS — I8510 Secondary esophageal varices without bleeding: Secondary | ICD-10-CM | POA: Diagnosis present

## 2017-05-17 DIAGNOSIS — K766 Portal hypertension: Secondary | ICD-10-CM | POA: Diagnosis present

## 2017-05-17 DIAGNOSIS — F1020 Alcohol dependence, uncomplicated: Secondary | ICD-10-CM | POA: Diagnosis present

## 2017-05-17 DIAGNOSIS — N179 Acute kidney failure, unspecified: Secondary | ICD-10-CM

## 2017-05-17 DIAGNOSIS — G8929 Other chronic pain: Secondary | ICD-10-CM | POA: Diagnosis present

## 2017-05-17 DIAGNOSIS — I10 Essential (primary) hypertension: Secondary | ICD-10-CM | POA: Diagnosis not present

## 2017-05-17 DIAGNOSIS — E872 Acidosis, unspecified: Secondary | ICD-10-CM

## 2017-05-17 DIAGNOSIS — E860 Dehydration: Secondary | ICD-10-CM | POA: Diagnosis present

## 2017-05-17 DIAGNOSIS — F1010 Alcohol abuse, uncomplicated: Secondary | ICD-10-CM | POA: Diagnosis present

## 2017-05-17 DIAGNOSIS — D538 Other specified nutritional anemias: Secondary | ICD-10-CM | POA: Diagnosis not present

## 2017-05-17 DIAGNOSIS — N183 Chronic kidney disease, stage 3 unspecified: Secondary | ICD-10-CM | POA: Diagnosis present

## 2017-05-17 DIAGNOSIS — K7460 Unspecified cirrhosis of liver: Secondary | ICD-10-CM

## 2017-05-17 DIAGNOSIS — M109 Gout, unspecified: Secondary | ICD-10-CM | POA: Diagnosis present

## 2017-05-17 DIAGNOSIS — K140 Glossitis: Secondary | ICD-10-CM | POA: Diagnosis present

## 2017-05-17 DIAGNOSIS — D890 Polyclonal hypergammaglobulinemia: Secondary | ICD-10-CM | POA: Diagnosis present

## 2017-05-17 DIAGNOSIS — E871 Hypo-osmolality and hyponatremia: Secondary | ICD-10-CM

## 2017-05-17 DIAGNOSIS — D61818 Other pancytopenia: Principal | ICD-10-CM | POA: Diagnosis present

## 2017-05-17 DIAGNOSIS — K7031 Alcoholic cirrhosis of liver with ascites: Secondary | ICD-10-CM | POA: Diagnosis present

## 2017-05-17 DIAGNOSIS — I129 Hypertensive chronic kidney disease with stage 1 through stage 4 chronic kidney disease, or unspecified chronic kidney disease: Secondary | ICD-10-CM | POA: Diagnosis present

## 2017-05-17 DIAGNOSIS — K7030 Alcoholic cirrhosis of liver without ascites: Secondary | ICD-10-CM | POA: Diagnosis present

## 2017-05-17 DIAGNOSIS — R0602 Shortness of breath: Secondary | ICD-10-CM | POA: Diagnosis present

## 2017-05-17 DIAGNOSIS — Z79899 Other long term (current) drug therapy: Secondary | ICD-10-CM

## 2017-05-17 LAB — PREPARE RBC (CROSSMATCH)

## 2017-05-17 LAB — HEPATIC FUNCTION PANEL
ALK PHOS: 152 U/L — AB (ref 38–126)
ALT: 25 U/L (ref 17–63)
AST: 93 U/L — ABNORMAL HIGH (ref 15–41)
Albumin: 2.8 g/dL — ABNORMAL LOW (ref 3.5–5.0)
Bilirubin, Direct: 2.3 mg/dL — ABNORMAL HIGH (ref 0.1–0.5)
Indirect Bilirubin: 2 mg/dL — ABNORMAL HIGH (ref 0.3–0.9)
TOTAL PROTEIN: 7.2 g/dL (ref 6.5–8.1)
Total Bilirubin: 4.3 mg/dL — ABNORMAL HIGH (ref 0.3–1.2)

## 2017-05-17 LAB — FERRITIN: Ferritin: 1557 ng/mL — ABNORMAL HIGH (ref 24–336)

## 2017-05-17 LAB — URINALYSIS, ROUTINE W REFLEX MICROSCOPIC
BACTERIA UA: NONE SEEN
Glucose, UA: NEGATIVE mg/dL
Hgb urine dipstick: NEGATIVE
KETONES UR: 5 mg/dL — AB
Leukocytes, UA: NEGATIVE
Nitrite: NEGATIVE
PROTEIN: 30 mg/dL — AB
Specific Gravity, Urine: 1.016 (ref 1.005–1.030)
pH: 5 (ref 5.0–8.0)

## 2017-05-17 LAB — I-STAT CG4 LACTIC ACID, ED: LACTIC ACID, VENOUS: 5.82 mmol/L — AB (ref 0.5–1.9)

## 2017-05-17 LAB — LACTIC ACID, PLASMA
LACTIC ACID, VENOUS: 4 mmol/L — AB (ref 0.5–1.9)
LACTIC ACID, VENOUS: 4.1 mmol/L — AB (ref 0.5–1.9)

## 2017-05-17 LAB — FOLATE: FOLATE: 2.7 ng/mL — AB (ref >5.9)

## 2017-05-17 LAB — SAVE SMEAR

## 2017-05-17 LAB — RETICULOCYTES
RBC.: 1.55 MIL/uL — AB (ref 4.22–5.81)
RETIC COUNT ABSOLUTE: 17.1 10*3/uL — AB (ref 19.0–186.0)
Retic Ct Pct: 1.1 % (ref 0.4–3.1)

## 2017-05-17 LAB — BASIC METABOLIC PANEL
ANION GAP: 19 — AB (ref 5–15)
BUN: 14 mg/dL (ref 6–20)
CALCIUM: 8.5 mg/dL — AB (ref 8.9–10.3)
CO2: 15 mmol/L — AB (ref 22–32)
Chloride: 94 mmol/L — ABNORMAL LOW (ref 101–111)
Creatinine, Ser: 1.54 mg/dL — ABNORMAL HIGH (ref 0.61–1.24)
GFR calc Af Amer: 56 mL/min — ABNORMAL LOW (ref >60)
GFR calc non Af Amer: 48 mL/min — ABNORMAL LOW (ref >60)
GLUCOSE: 127 mg/dL — AB (ref 65–99)
POTASSIUM: 3.7 mmol/L (ref 3.5–5.1)
Sodium: 128 mmol/L — ABNORMAL LOW (ref 135–145)

## 2017-05-17 LAB — LACTATE DEHYDROGENASE: LDH: 157 U/L (ref 98–192)

## 2017-05-17 LAB — CBC
HEMATOCRIT: 19.7 % — AB (ref 39.0–52.0)
HEMOGLOBIN: 6.6 g/dL — AB (ref 13.0–17.0)
MCH: 32.8 pg (ref 26.0–34.0)
MCHC: 33.5 g/dL (ref 30.0–36.0)
MCV: 98 fL (ref 78.0–100.0)
Platelets: 75 10*3/uL — ABNORMAL LOW (ref 150–400)
RBC: 2.01 MIL/uL — ABNORMAL LOW (ref 4.22–5.81)
RDW: 19.4 % — AB (ref 11.5–15.5)
WBC: 2.7 10*3/uL — ABNORMAL LOW (ref 4.0–10.5)

## 2017-05-17 LAB — IRON AND TIBC
Iron: 159 ug/dL (ref 45–182)
Saturation Ratios: 82 % — ABNORMAL HIGH (ref 17.9–39.5)
TIBC: 195 ug/dL — ABNORMAL LOW (ref 250–450)
UIBC: 36 ug/dL

## 2017-05-17 LAB — MAGNESIUM: Magnesium: 1 mg/dL — ABNORMAL LOW (ref 1.7–2.4)

## 2017-05-17 LAB — VITAMIN B12: VITAMIN B 12: 404 pg/mL (ref 180–914)

## 2017-05-17 LAB — POCT HEMOGLOBIN: Hemoglobin: 5.4 g/dL — AB (ref 14.1–18.1)

## 2017-05-17 MED ORDER — THIAMINE HCL 100 MG/ML IJ SOLN
100.0000 mg | Freq: Every day | INTRAMUSCULAR | Status: DC
  Filled 2017-05-17 (×2): qty 2

## 2017-05-17 MED ORDER — SODIUM CHLORIDE 0.9 % IV BOLUS (SEPSIS): 1000.0000 mL | Freq: Once | INTRAVENOUS | Status: DC

## 2017-05-17 MED ORDER — SODIUM CHLORIDE 0.9% FLUSH
3.0000 mL | Freq: Two times a day (BID) | INTRAVENOUS | Status: DC
  Administered 2017-05-17 – 2017-05-22 (×9): 3 mL via INTRAVENOUS

## 2017-05-17 MED ORDER — MAGNESIUM SULFATE 2 GM/50ML IV SOLN
2.0000 g | Freq: Every day | INTRAVENOUS | Status: AC
  Administered 2017-05-17 – 2017-05-22 (×5): 2 g via INTRAVENOUS
  Filled 2017-05-17 (×7): qty 50

## 2017-05-17 MED ORDER — VITAMIN B-1 100 MG PO TABS
100.0000 mg | ORAL_TABLET | Freq: Every day | ORAL | Status: DC
  Administered 2017-05-17 – 2017-05-22 (×6): 100 mg via ORAL
  Filled 2017-05-17 (×6): qty 1

## 2017-05-17 MED ORDER — PROPRANOLOL HCL 20 MG PO TABS
20.0000 mg | ORAL_TABLET | Freq: Two times a day (BID) | ORAL | Status: DC
  Administered 2017-05-18 – 2017-05-20 (×2): 20 mg via ORAL
  Filled 2017-05-17 (×9): qty 1

## 2017-05-17 MED ORDER — SODIUM CHLORIDE 0.9 % IV SOLN
INTRAVENOUS | Status: DC
  Administered 2017-05-17 – 2017-05-19 (×3): via INTRAVENOUS

## 2017-05-17 MED ORDER — LORAZEPAM 2 MG/ML IJ SOLN: 0.5000 mg | Freq: Four times a day (QID) | INTRAMUSCULAR | Status: AC | PRN

## 2017-05-17 MED ORDER — FOLIC ACID 1 MG PO TABS
1.0000 mg | ORAL_TABLET | Freq: Every day | ORAL | Status: DC
  Administered 2017-05-17 – 2017-05-22 (×6): 1 mg via ORAL
  Filled 2017-05-17 (×6): qty 1

## 2017-05-17 MED ORDER — SODIUM CHLORIDE 0.9 % IV SOLN
10.0000 mL/h | Freq: Once | INTRAVENOUS | Status: AC
  Administered 2017-05-17: 10 mL/h via INTRAVENOUS

## 2017-05-17 MED ORDER — PANTOPRAZOLE SODIUM 40 MG PO TBEC
40.0000 mg | DELAYED_RELEASE_TABLET | Freq: Every day | ORAL | Status: DC
  Administered 2017-05-17 – 2017-05-22 (×6): 40 mg via ORAL
  Filled 2017-05-17 (×6): qty 1

## 2017-05-17 MED ORDER — ADULT MULTIVITAMIN W/MINERALS CH
1.0000 | ORAL_TABLET | Freq: Every day | ORAL | Status: DC
  Administered 2017-05-17 – 2017-05-22 (×6): 1 via ORAL
  Filled 2017-05-17 (×6): qty 1

## 2017-05-17 MED ORDER — LORAZEPAM 0.5 MG PO TABS: 0.5000 mg | ORAL_TABLET | Freq: Four times a day (QID) | ORAL | Status: AC | PRN

## 2017-05-17 NOTE — H&P (Signed)
[facility] Admission History and Physical Service Pager: [phone]  Patient name: William [name] Medical record number: [id number] Date of birth: 07/20/1961 Age: 56 y.o. Gender: male  Primary Care Provider: Vivi Barrack, MD Consultants: none Code Status: FULL (discussed on admission).  Daughter [name] Darl Pikes [phone]) is health care POA.  Chief Complaint: fatigue  Assessment and Plan: William [name] is a 56 y.o. male presenting with fatigue. PMH is significant for chronic anemia, alcoholic cirrhosis, esophageal varices, polyclonal elevation of gamma globulins, HTN, CKD3, and reported prostate cancer.   Fatigue/symptomatic anemia, pancytopenia: Patient tachycardic to 130s in ED.  SBP initially 101.  RR also elevated 25.  VS improved after fluid bolus.  Hgb 6.6.  WBC 2.7.  Plts 75.  Hemoglobin on discharge from most recent admission with anemia in September 2017 was 8.2. No additional labs for baseline. Folate low at 2.7, B12 404, Iron 159, TIBC low 195, Saturation Ratios high 82%, Ferritin high 1557, Retics inappropriately normal at 1.1%, LDH 157.  Patient notes fatigue 2 weeks as well as shortness of breath with exertion. Known history of anemia/ pancytopenia, has seen hematology most recently this fall. Etiology thought to be secondary to cirrhosis of the liver causing splenomegaly and subsequent sequestrations. Additionally, has a history of polyclonal elevation of gamma globulins felt to be a reactive process rather than a plasma cell disorder by hematology.  Denies any acute symptoms of bleeding.  No recent illness to suggest viral mediated myelosuppression.  HIV negative in 08/2016.   -Admit to telemetry, Dr. Ardelia Mems attending -Transfuse 2 units packed red blood cells -Continue maintenance fluids overnight with normal saline -Repeat iron panel, folate, B12, haptoglobin, LDH, parvovirus, peripheral smear, INR  -FOBT  -EKG -consider consulting  hematology in am  Lactic acidosis: Lactate elevated to 5.8, likely in the setting of dehydration. UA w/ 5 ketones  Patient endorses vomiting over the past several days. Metabolic acidosis consistent with dehydration. Tachycardic due to symptomatic anemia, infectious etiology less likely as patient denies any fever, cough, new shortness of breath, abdominal pain, dysuria. -Trend lactic acid -Hydrate as above -CXR  AKI: Cr 1.54.  baseline Cr appears to be 1.06. Likely secondary to dehydration due to vomiting and poor by mouth intake with concomitant alcoholism. -Hydrate as above -Recheck creatinine in a.m. -Consider further workup if not improving with hydration  Hyponatremia: History of the same, likely due to poor by mouth intake secondary to alcoholism.  Baseline Na 130-134 -CMP in am  -IVFs as above  Alcoholic cirrhosis with esophageal varices: LFT elevation with AST greater than 2 times ALT, consistent with alcoholic cirrhosis. INR ordered to assess clotting function.  Tbili 4.3, DBili 2.3, IBili 2.0. Mg 1.0.  Patient endorses current drinking, last drink yesterday half a pint of liquor. On propranolol for prophylaxis of variceal hemorrhage. Additionally on spironolactone. -Hold spironolactone in the setting of dehydration -CMP, INR in a.m. -Continue propranolol at a reduced dose -CIWA scoring, 0.5 mg Ativan when necessary due to liver dysfunction -Check magnesium, replete as this is likely low in the setting of alcoholism  -Folic acid, MVI, Thiamine -Replete Mg  History of prostate cancer: on problem list. No further details in our chart.  PSA in 08/2016: 0.37 -PCP to follow up as an outpatient   Intermittent dyspnea:  patient reports shortness of breath with exertion for some time. Normal respiratory effort and rate on our exam. Likely due to symptomatic anemia, however if continued despite transfusion could consider cardiac  causes including nonischemic cardiomyopathy secondary to  alcoholism. -Consider echo if persistent  Hyperpigmentation of tongue: patient with hyperpigmented patches of tongue, he cannot recall duration of this or previously diagnoses. Denies ever smoking.  ?geographic tongue vs petechial lesions 2/2 thrombocytopenia vs melanoma -consider outpatient derm for possible biopsy/evaluation  FEN/GI: MIVF, Normal diet Prophylaxis: SCDs in setting of anemia and thrombocytopenia  Disposition: admit to tele  History of Present Illness:  William [name] is a 56 y.o. male presenting with symptomatic anemia.   Patient reports that he knew his blood level was low because he started being really tired over the last 1-2 weeks.  He notes that he has vomited a few times recently but no hematemesis.  No melena, hematochezia.  No bleeding anywhere.  He reports good fluid intake and good PO intake in general.  He reports DOE that is chronic for him.  He reports SOB is worse over the last couple of weeks.  He denies recent illness.  No fevers, chills, cough, palpitations, CP, pallor, dizziness.  He reports a fall a few weeks ago after drinking "one too many".  Did not hit head at that time.  He resides alone.  He drives and receives disability.  He notes that he eats a couple of meals daily.  No problems affording groceries.  He reports occ ETOH use.  Last drink was last night, when he drank about 1/2 pint of liquor.  He notes that this is average for him when he drinks.  Never had seizures for ETOH withdrawal.  Never been to rehab for ETOH use.  Goes to AA sometimes.  He last saw Heme last fall.  He reports that he takes iron at home.  He manages his own meds, occ using a pill box.  He reports that over the last couple of weeks he has missed meds 3-4 times.  Has not taken any of his medications today.  Review Of Systems: Per HPI with the following additions:   Review of Systems  Constitutional: Positive for malaise/fatigue. Negative for chills, diaphoresis and fever.   Respiratory: Positive for shortness of breath. Negative for cough and hemoptysis.   Cardiovascular: Negative for chest pain and palpitations.  Gastrointestinal: Positive for vomiting. Negative for abdominal pain and melena.  Genitourinary: Negative for frequency, hematuria and urgency.  Neurological: Negative for dizziness, focal weakness, seizures and weakness.    Patient Active Problem List   Diagnosis Date Noted  . Anemia 05/17/2017  . Dark urine 09/16/2016  . Diarrhea   . Colon wall thickening   . Anemia due to bone marrow failure ([place])   . Prostate cancer ([place])   . Malnutrition of moderate degree 09/10/2016  . Unintentional weight loss   . Dehydration   . Hypomagnesemia   . Hypokalemia   . Hypocalcemia   . Vomiting, persistent, in adult   . Intractable hiccups   . Decreased appetite 09/09/2016  . Symptomatic anemia 09/09/2016  . Chronic pain 02/08/2016  . Pancytopenia ([place]) 05/29/2015  . History of esophageal varices 08/20/2012  . Shoulder pain, right 06/27/2012  . Gout 06/27/2012  . MGUS (monoclonal gammopathy of unknown significance) 06/15/2012  . Hypertension 02/28/2012  . Cervical pain (neck) 01/16/2012  . Alcohol abuse 01/16/2012  . Chronic kidney disease (CKD), stage III (moderate) 12/13/2011  . Erectile dysfunction 12/13/2011  . Back pain 08/11/2011  . Shoulder pain, left 02/07/2011  . Knee pain 02/07/2011  . Cirrhosis ([place]) 08/04/2010  . Alcoholic cirrhosis of liver ([place]) 02/23/2010  Past Medical History: Past Medical History:  Diagnosis Date  . Anemia   . Chronic kidney disease   . Cirrhosis of liver ([place])   . COMPRESSION FRACTURE, LUMBAR VERTEBRAE 07/29/2010   Qualifier: Diagnosis of  By: Anabel Bene    . Hypertension   . Prostate cancer [facility])     Past Surgical History: Past Surgical History:  Procedure Laterality Date  . ACHILLES TENDON REPAIR    . ESOPHAGOGASTRODUODENOSCOPY (EGD) WITH PROPOFOL N/A 09/13/2016   Procedure:  ESOPHAGOGASTRODUODENOSCOPY (EGD) WITH PROPOFOL;  Surgeon: Wonda Horner, MD;  Location: [facility] ENDOSCOPY;  Service: Endoscopy;  Laterality: N/A;    Social History: Social History  Substance Use Topics  . Smoking status: Never Smoker  . Smokeless tobacco: Never Used  . Alcohol use Yes     Comment: 1 pint per week   Additional social history: Lives alone, daughter is healthcare power of attorney. Denies smoking. to relevant sections of EMR.  Family History: Family History  Problem Relation Age of Onset  . Kidney disease Mother   . Arthritis Mother   . Hypertension Father      Allergies and Medications: No Known Allergies No current facility-administered medications on file prior to encounter.    Current Outpatient Prescriptions on File Prior to Encounter  Medication Sig Dispense Refill  . allopurinol (ZYLOPRIM) 100 MG tablet TAKE ONE TABLET BY MOUTH ONCE DAILY 30 tablet 11  . colchicine (COLCRYS) 0.6 MG tablet Take 0.5 tablets (0.3 mg total) by mouth daily. 30 tablet 3  . folic acid (FOLVITE) 1 MG tablet TAKE ONE TABLET BY MOUTH DAILY 90 tablet 3  . IRON PO Take 1 tablet by mouth daily.    Marland Kitchen omeprazole (PRILOSEC) 40 MG capsule TAKE ONE CAPSULE BY MOUTH ONCE DAILY 30 capsule 11  . propranolol (INDERAL) 40 MG tablet TAKE ONE TABLET BY MOUTH TWICE DAILY 60 tablet 11  . spironolactone (ALDACTONE) 25 MG tablet TAKE ONE TABLET BY MOUTH ONCE DAILY 30 tablet 5  . baclofen (LIORESAL) 10 MG tablet TAKE ONE TABLET BY MOUTH THREE TIMES DAILY AS NEEDED FOR MUSCLE SPASMS (Patient not taking: Reported on 05/17/2017) 30 tablet 3  . diclofenac sodium (VOLTAREN) 1 % GEL Apply 2 g topically 4 (four) times daily. (Patient not taking: Reported on 05/17/2017) 100 g 5  . gabapentin (NEURONTIN) 100 MG capsule Take 1 capsule (100 mg total) by mouth 3 (three) times daily. (Patient not taking: Reported on 05/17/2017) 90 capsule 0  . traMADol (ULTRAM) 50 MG tablet Take 1 tablet (50 mg total) by mouth every 8  (eight) hours as needed. (Patient not taking: Reported on 05/17/2017) 30 tablet 2  . Vitamin D, Ergocalciferol, (DRISDOL) 50000 units CAPS capsule Take 1 capsule (50,000 Units total) by mouth every 7 (seven) days. (Patient not taking: Reported on 05/17/2017) 4 capsule 0    Objective: BP 119/86 (BP Location: Right Arm)   Pulse (!) 112   Temp 97.9 F (36.6 C) (Oral)   Resp (!) 22   Ht 5\' 11"  (1.803 m)   Wt 162 lb (73.5 kg)   SpO2 100%   BMI 22.59 kg/m  Exam: General: Pale, well appearing male sitting up in bed in NAD.  Eyes:Pale conjunctiva, scleral icterus, EOMI, PERRLA ENTM: MMM, poor dentition, tongue as pictured   Neck: supple, mild JVD  Cardiovascular: tachycardia, regular rhythm, no murmurs  Respiratory: CTAB, normal WOB on RA  Gastrointestinal: SNTND, +BS, persistent hiccups, spleen not palpable, no ascites  MSK: normal muscle tone,  mildly reduced muscle bulk, no peripheral edema Derm: no rashes, pale, see tongue finding above Neuro: CN II-XII grossly intact, strength and sensation appropriate Psych: mood and affect appropriate, jovial   Labs and Imaging: CBC BMET   Recent Labs Lab 05/17/17 1346  WBC 2.7*  HGB 6.6*  HCT 19.7*  PLT 75*    Recent Labs Lab 05/17/17 1346  NA 128*  K 3.7  CL 94*  CO2 15*  BUN 14  CREATININE 1.54*  GLUCOSE 127*  CALCIUM 8.5*     Lactic acid 5.82 Urine with ketones, protein, small bilirubin  Reticulocytes 1.1%  LDH 157   Sela Hilding, MD 05/17/2017, 4:45 PM PGY-1, [facility] pager: [phone], text pages welcome  I have separately seen and examined the patient. I have discussed the findings and exam with Dr Lindell Noe and agree with the above note.  My changes/additions are outlined in BLUE.   Charl Wellen M. Lajuana Ripple, DO PGY-3, [facility]

## 2017-05-17 NOTE — Assessment & Plan Note (Signed)
Patient having progressively worsening fatigue and dyspnea with walking up stairs for the last 1.5 weeks. POC Hgb was 5.4 today. No chest pain, no palpitations, no shortness of breath at rest. Denies any bleeding. Will send patient to the ED for further management. Patient will warrant admission for blood transfusion.

## 2017-05-17 NOTE — ED Provider Notes (Addendum)
[place] DEPT Provider Note   CSN: [id number] Arrival date & time: 05/17/17  1310     History   Chief Complaint Chief Complaint  Patient presents with  . Fatigue    HPI William [name] is a 56 y.o. male.  The history is provided by the patient.  He was sent to the ED from family practice clinic. He presented with complaints of weakness and exertional dyspnea for about the last 10 days. He states that he gets short of breath if he goes up steps or if he walks more than about 30 feet. The symptoms of been stable. He denies chest pain, heaviness, tightness, pressure. He denies dizziness or lightheadedness. He denies chest pain. In fact, he denies pain anywhere. He denies dark stools. He is not taking any anticoagulants or NSAIDs. He had been admitted for similar symptoms in the past and required blood transfusion.  Past Medical History:  Diagnosis Date  . Anemia   . Chronic kidney disease   . Cirrhosis of liver ([place])   . COMPRESSION FRACTURE, LUMBAR VERTEBRAE 07/29/2010   Qualifier: Diagnosis of  By: Anabel Bene    . Hypertension   . Prostate cancer [facility])     Patient Active Problem List   Diagnosis Date Noted  . Dark urine 09/16/2016  . Diarrhea   . Colon wall thickening   . Anemia due to bone marrow failure ([place])   . Prostate cancer ([place])   . Malnutrition of moderate degree 09/10/2016  . Unintentional weight loss   . Dehydration   . Hypomagnesemia   . Hypokalemia   . Hypocalcemia   . Vomiting, persistent, in adult   . Intractable hiccups   . Decreased appetite 09/09/2016  . Symptomatic anemia 09/09/2016  . Chronic pain 02/08/2016  . Pancytopenia ([place]) 05/29/2015  . History of esophageal varices 08/20/2012  . Shoulder pain, right 06/27/2012  . Gout 06/27/2012  . MGUS (monoclonal gammopathy of unknown significance) 06/15/2012  . Hypertension 02/28/2012  . Cervical pain (neck) 01/16/2012  . Alcohol abuse 01/16/2012  . Chronic kidney disease (CKD), stage  III (moderate) 12/13/2011  . Erectile dysfunction 12/13/2011  . Back pain 08/11/2011  . Shoulder pain, left 02/07/2011  . Knee pain 02/07/2011  . Cirrhosis ([place]) 08/04/2010  . Alcoholic cirrhosis of liver ([place]) 02/23/2010    Past Surgical History:  Procedure Laterality Date  . ACHILLES TENDON REPAIR    . ESOPHAGOGASTRODUODENOSCOPY (EGD) WITH PROPOFOL N/A 09/13/2016   Procedure: ESOPHAGOGASTRODUODENOSCOPY (EGD) WITH PROPOFOL;  Surgeon: Wonda Horner, MD;  Location: [facility] ENDOSCOPY;  Service: Endoscopy;  Laterality: N/A;       Home Medications    Prior to Admission medications   Medication Sig Start Date End Date Taking? Authorizing Provider  allopurinol (ZYLOPRIM) 100 MG tablet TAKE ONE TABLET BY MOUTH ONCE DAILY 01/05/17  Yes Vivi Barrack, MD  colchicine (COLCRYS) 0.6 MG tablet Take 0.5 tablets (0.3 mg total) by mouth daily. 02/19/16  Yes Vivi Barrack, MD  folic acid (FOLVITE) 1 MG tablet TAKE ONE TABLET BY MOUTH DAILY 09/21/16  Yes Vivi Barrack, MD  IRON PO Take 1 tablet by mouth daily.   Yes [provider]  omeprazole (PRILOSEC) 40 MG capsule TAKE ONE CAPSULE BY MOUTH ONCE DAILY 08/09/16  Yes Vivi Barrack, MD  propranolol (INDERAL) 40 MG tablet TAKE ONE TABLET BY MOUTH TWICE DAILY 08/09/16  Yes Vivi Barrack, MD  spironolactone (ALDACTONE) 25 MG tablet TAKE ONE TABLET BY MOUTH ONCE  DAILY 09/21/16  Yes Vivi Barrack, MD  thiamine (VITAMIN B-1) 100 MG tablet Take 100 mg by mouth daily.   Yes [provider]  baclofen (LIORESAL) 10 MG tablet TAKE ONE TABLET BY MOUTH THREE TIMES DAILY AS NEEDED FOR MUSCLE SPASMS Patient not taking: Reported on 05/17/2017 08/09/16   Vivi Barrack, MD  diclofenac sodium (VOLTAREN) 1 % GEL Apply 2 g topically 4 (four) times daily. Patient not taking: Reported on 05/17/2017 01/06/15   Vivi Barrack, MD  gabapentin (NEURONTIN) 100 MG capsule Take 1 capsule (100 mg total) by mouth 3 (three) times daily. Patient not taking:  Reported on 05/17/2017 02/08/16   Vivi Barrack, MD  traMADol (ULTRAM) 50 MG tablet Take 1 tablet (50 mg total) by mouth every 8 (eight) hours as needed. Patient not taking: Reported on 05/17/2017 05/29/15   Vivi Barrack, MD  Vitamin D, Ergocalciferol, (DRISDOL) 50000 units CAPS capsule Take 1 capsule (50,000 Units total) by mouth every 7 (seven) days. Patient not taking: Reported on 05/17/2017 09/14/16   Beatty Bing, DO    Family History Family History  Problem Relation Age of Onset  . Kidney disease Mother   . Arthritis Mother   . Hypertension Father     Social History Social History  Substance Use Topics  . Smoking status: Never Smoker  . Smokeless tobacco: Never Used  . Alcohol use Yes     Comment: 1 pint per week     Allergies   Patient has no known allergies.   Review of Systems Review of Systems  All other systems reviewed and are negative.    Physical Exam Updated Vital Signs BP 119/86 (BP Location: Right Arm)   Pulse (!) 112   Temp 97.9 F (36.6 C) (Oral)   Resp (!) 22   Ht 5\' 11"  (1.803 m)   Wt 73.5 kg (162 lb)   SpO2 100%   BMI 22.59 kg/m   Physical Exam  Nursing note and vitals reviewed.  56 year old male, resting comfortably and in no acute distress. Vital signs are significant for tachycardia and tachypnea. Oxygen saturation is 100%, which is normal. Head is normocephalic and atraumatic. PERRLA, EOMI. Conjunctivae are pale. Oropharynx is clear. Neck is nontender and supple without adenopathy or JVD. Back is nontender and there is no CVA tenderness. Lungs are clear without rales, wheezes, or rhonchi. Chest is nontender. Heart has regular rate and rhythm without murmur. Abdomen is soft, flat, nontender without masses or hepatosplenomegaly and peristalsis is normoactive. Extremities have no cyanosis or edema, full range of motion is present. Skin is warm and dry without rash. Neurologic: Mental status is normal, cranial nerves are intact,  there are no motor or sensory deficits.  ED Treatments / Results  Labs (all labs ordered are listed, but only abnormal results are displayed) Labs Reviewed  BASIC METABOLIC PANEL - Abnormal; Notable for the following:       Result Value   Sodium 128 (*)    Chloride 94 (*)    CO2 15 (*)    Glucose, Bld 127 (*)    Creatinine, Ser 1.54 (*)    Calcium 8.5 (*)    GFR calc non Af Amer 48 (*)    GFR calc Af Amer 56 (*)    Anion gap 19 (*)    All other components within normal limits  CBC - Abnormal; Notable for the following:    WBC 2.7 (*)    RBC 2.01 (*)  Hemoglobin 6.6 (*)    HCT 19.7 (*)    RDW 19.4 (*)    Platelets 75 (*)    All other components within normal limits  URINALYSIS, ROUTINE W REFLEX MICROSCOPIC - Abnormal; Notable for the following:    Color, Urine AMBER (*)    APPearance HAZY (*)    Bilirubin Urine SMALL (*)    Ketones, ur 5 (*)    Protein, ur 30 (*)    Squamous Epithelial / LPF 0-5 (*)    All other components within normal limits  VITAMIN B12  FOLATE  IRON AND TIBC  FERRITIN  RETICULOCYTES  HEPATIC FUNCTION PANEL  CBG MONITORING, ED  I-STAT CG4 LACTIC ACID, ED  PREPARE RBC (CROSSMATCH)    Procedures Procedures (including critical care time) CRITICAL CARE Performed by: [email or web address],JHERD Total critical care time: 45 minutes Critical care time was exclusive of separately billable procedures and treating other patients. Critical care was necessary to treat or prevent imminent or life-threatening deterioration. Critical care was time spent personally by me on the following activities: development of treatment plan with patient and/or surrogate as well as nursing, discussions with consultants, evaluation of patient's response to treatment, examination of patient, obtaining history from patient or surrogate, ordering and performing treatments and interventions, ordering and review of laboratory studies, ordering and review of radiographic studies, pulse oximetry  and re-evaluation of patient's condition.  Medications Ordered in ED Medications  0.9 %  sodium chloride infusion (not administered)     Initial Impression / Assessment and Plan / ED Course  I have reviewed the triage vital signs and the nursing notes.  Pertinent lab results that were available during my care of the patient were reviewed by me and considered in my medical decision making (see chart for details).  Symptomatic anemia. Old records are reviewed and he was seen in family practice clinic today where point-of-care hemoglobin was 5.4. He had been admitted for pancytopenia last September. Immunoglobin studies showed a polyclonal gammopathy. Labs today show significant anemia of pancytopenia. There is also metabolic acidosis with declining renal function. He will clearly need to be admitted. Blood is drawn for anemia panel and blood transfusion is ordered. He will not also need to have his metabolic acidosis and decline in renal function investigated. Case is discussed with Anda Kraft from family practice service who agrees to admit the patient.  Final Clinical Impressions(s) / ED Diagnoses   Final diagnoses:  Symptomatic anemia  Pancytopenia ([place])  Acute kidney injury (nontraumatic) (HCC)  Metabolic acidosis  Hyponatremia    New Prescriptions New Prescriptions   No medications on file     Delora Fuel, MD 40/81/44 1632  Lactic acid level has come back elevated at 5.82. This is felt to be due to anemia and not from sepsis. IV fluids have been ordered and he will be getting blood transfusion shortly.   Delora Fuel, MD 81/85/63 1497    Delora Fuel, MD 02/63/78 [phone]

## 2017-05-17 NOTE — ED Notes (Signed)
Pt states the weakness started a weak ago, pt states when exerting he becomes SOB. Pt states he has to sit down for 5 minutes to catch his breath. Pt states he hasn't have tarry stool, and recently saw his urination dark.

## 2017-05-17 NOTE — Patient Instructions (Signed)
Your hemoglobin was 5.4. Please go over to the ED.  -Dr. Brett Albino

## 2017-05-17 NOTE — Progress Notes (Signed)
CRITICAL VALUE ALERT  Critical Value:  Lactic acid 4.1  Date & Time Notied:  05/17/17, 11:09pm  Provider Notified: Family Medicine Resident  Orders Received/Actions taken: NA

## 2017-05-17 NOTE — ED Notes (Signed)
Attempted to contact nurse

## 2017-05-17 NOTE — ED Triage Notes (Signed)
Per Pt, Pt  Is coming from home with complaints of fatigue x 1.5 weeks. Pt reports "I have low blood. This is how I feel." Denies blood in urine or stool. Reports dry cough.

## 2017-05-17 NOTE — Progress Notes (Signed)
CRITICAL VALUE ALERT  Critical Value:  Lactic Acid : 4.0  Date & Time Notied:  8:51pm  Provider Notified: Dr. Lindell Noe  Orders Received/Actions taken: No new orders as this time.  Will continue to monitor.

## 2017-05-17 NOTE — ED Notes (Signed)
Bedside report given

## 2017-05-17 NOTE — Progress Notes (Signed)
   [facility] Phone: [phone]  Subjective:  Dameian is a 56 year old male presenting to same day clinic with fatigue for the last 1.5 weeks. The fatigue has been gradually getting worse. The fatigue is located "all over". No new medications. No chest pain, no palpitations. He endorses occasional shortness of breath with going up stairs. No palpitations. No bleeding gums, hematemesis, hematochezia, melena. No URI symptoms. No swelling. No headaches. No blurry vision.   Of note, he was admitted to the hospital from 09/09/16-09/14/16 for symptomatic anemia. During that time, he was found to have a hemoglobin of 5.7 and was transfused 3 units pRBCs. He had an EGD done at that hospitalization that showed grade 1 esophageal varices. He followed up with Dr. Alen Blew (heme-onc) in 09/2016. At that time, it was thought that his polyclonal elevation was due to a reactive process rather than a plasma cell disorder, making MGUS less likely. Dr. Alen Blew thought his anemia was multifactorial and that he may need periodic follow-up, supplemental iron, and occasional transfusion.  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 No changes.  Social history- patient is a never smoker  Objective: BP 108/68   Pulse 70   Temp 98 F (36.7 C) (Oral)   Wt 162 lb (73.5 kg)   SpO2 99%   BMI 22.59 kg/m  Gen: NAD, alert, cooperative with exam HEENT: Conjunctival pallor, EOMI, no scleral icterus CV: Tachycardic, regular rhythm, no murmurs Resp: CTABL, no wheezes, normal work of breathing Msk: No edema  Assessment/Plan: Symptomatic Anemia: Patient having progressively worsening fatigue and dyspnea with walking up stairs for the last 1.5 weeks. POC Hgb was 5.4 today. No chest pain, no palpitations, no shortness of breath at  rest. Denies any bleeding. Will send patient to the ED for further management. Patient will warrant admission for blood transfusion.    Hyman Bible, MD PGY-2

## 2017-05-17 NOTE — ED Notes (Signed)
Lactic Acid result 2.94 given to Dr. Roxanne Mins

## 2017-05-17 NOTE — ED Notes (Signed)
Patient signed consent for blood.  Papers at bedside and to be transported with patient to the floor.

## 2017-05-18 ENCOUNTER — Encounter (HOSPITAL_COMMUNITY): Payer: Self-pay | Admitting: Radiology

## 2017-05-18 ENCOUNTER — Inpatient Hospital Stay (HOSPITAL_COMMUNITY): Payer: Medicare PPO

## 2017-05-18 DIAGNOSIS — K7030 Alcoholic cirrhosis of liver without ascites: Secondary | ICD-10-CM

## 2017-05-18 DIAGNOSIS — N179 Acute kidney failure, unspecified: Secondary | ICD-10-CM

## 2017-05-18 DIAGNOSIS — D649 Anemia, unspecified: Secondary | ICD-10-CM

## 2017-05-18 LAB — CBC
HCT: 25.7 % — ABNORMAL LOW (ref 39.0–52.0)
HEMATOCRIT: 19.6 % — AB (ref 39.0–52.0)
HEMOGLOBIN: 6.7 g/dL — AB (ref 13.0–17.0)
Hemoglobin: 8.8 g/dL — ABNORMAL LOW (ref 13.0–17.0)
MCH: 30.9 pg (ref 26.0–34.0)
MCH: 30.6 pg (ref 26.0–34.0)
MCHC: 34.2 g/dL (ref 30.0–36.0)
MCHC: 34.2 g/dL (ref 30.0–36.0)
MCV: 90.3 fL (ref 78.0–100.0)
MCV: 89.2 fL (ref 78.0–100.0)
PLATELETS: 48 10*3/uL — AB (ref 150–400)
Platelets: 37 10*3/uL — ABNORMAL LOW (ref 150–400)
RBC: 2.17 MIL/uL — AB (ref 4.22–5.81)
RBC: 2.88 MIL/uL — AB (ref 4.22–5.81)
RDW: 19.4 % — ABNORMAL HIGH (ref 11.5–15.5)
RDW: 20.5 % — ABNORMAL HIGH (ref 11.5–15.5)
WBC: 1.7 10*3/uL — AB (ref 4.0–10.5)
WBC: 1.9 10*3/uL — ABNORMAL LOW (ref 4.0–10.5)

## 2017-05-18 LAB — COMPREHENSIVE METABOLIC PANEL
ALBUMIN: 2.4 g/dL — AB (ref 3.5–5.0)
ALK PHOS: 126 U/L (ref 38–126)
ALK PHOS: 144 U/L — AB (ref 38–126)
ALT: 22 U/L (ref 17–63)
ALT: 24 U/L (ref 17–63)
ANION GAP: 10 (ref 5–15)
ANION GAP: 15 (ref 5–15)
AST: 91 U/L — ABNORMAL HIGH (ref 15–41)
AST: 116 U/L — ABNORMAL HIGH (ref 15–41)
Albumin: 2.7 g/dL — ABNORMAL LOW (ref 3.5–5.0)
BILIRUBIN TOTAL: 6.5 mg/dL — AB (ref 0.3–1.2)
BUN: 18 mg/dL (ref 6–20)
BUN: 18 mg/dL (ref 6–20)
CALCIUM: 7.5 mg/dL — AB (ref 8.9–10.3)
CALCIUM: 7.8 mg/dL — AB (ref 8.9–10.3)
CO2: 21 mmol/L — ABNORMAL LOW (ref 22–32)
CO2: 15 mmol/L — ABNORMAL LOW (ref 22–32)
CREATININE: 1.33 mg/dL — AB (ref 0.61–1.24)
Chloride: 101 mmol/L (ref 101–111)
Chloride: 102 mmol/L (ref 101–111)
Creatinine, Ser: 1.31 mg/dL — ABNORMAL HIGH (ref 0.61–1.24)
GFR calc non Af Amer: 57 mL/min — ABNORMAL LOW (ref >60)
GFR, EST NON AFRICAN AMERICAN: 58 mL/min — AB (ref >60)
Glucose, Bld: 113 mg/dL — ABNORMAL HIGH (ref 65–99)
Glucose, Bld: 111 mg/dL — ABNORMAL HIGH (ref 65–99)
POTASSIUM: 3.4 mmol/L — AB (ref 3.5–5.1)
Potassium: 4 mmol/L (ref 3.5–5.1)
Sodium: 132 mmol/L — ABNORMAL LOW (ref 135–145)
Sodium: 132 mmol/L — ABNORMAL LOW (ref 135–145)
TOTAL PROTEIN: 6.1 g/dL — AB (ref 6.5–8.1)
TOTAL PROTEIN: 6.6 g/dL (ref 6.5–8.1)
Total Bilirubin: 5.2 mg/dL — ABNORMAL HIGH (ref 0.3–1.2)

## 2017-05-18 LAB — PREPARE RBC (CROSSMATCH)

## 2017-05-18 LAB — RPR: RPR: NONREACTIVE

## 2017-05-18 LAB — LACTIC ACID, PLASMA
LACTIC ACID, VENOUS: 1.2 mmol/L (ref 0.5–1.9)
LACTIC ACID, VENOUS: 1.3 mmol/L (ref 0.5–1.9)

## 2017-05-18 LAB — PROTIME-INR
INR: 1.56
PROTHROMBIN TIME: 18.9 s — AB (ref 11.4–15.2)

## 2017-05-18 LAB — PHOSPHORUS: Phosphorus: <1 mg/dL — CL (ref 2.5–4.6)

## 2017-05-18 LAB — PATHOLOGIST SMEAR REVIEW

## 2017-05-18 LAB — MAGNESIUM: Magnesium: 1.4 mg/dL — ABNORMAL LOW (ref 1.7–2.4)

## 2017-05-18 MED ORDER — SODIUM CHLORIDE 0.9 % IV SOLN
Freq: Once | INTRAVENOUS | Status: AC
  Administered 2017-05-18: 11:00:00 via INTRAVENOUS

## 2017-05-18 MED ORDER — POTASSIUM CHLORIDE CRYS ER 20 MEQ PO TBCR
40.0000 meq | EXTENDED_RELEASE_TABLET | Freq: Two times a day (BID) | ORAL | Status: AC
  Administered 2017-05-18 (×2): 40 meq via ORAL
  Filled 2017-05-18 (×2): qty 2

## 2017-05-18 MED ORDER — DEXTROSE 5 % IV SOLN
30.0000 mmol | Freq: Once | INTRAVENOUS | Status: AC
  Administered 2017-05-18: 30 mmol via INTRAVENOUS
  Filled 2017-05-18: qty 10

## 2017-05-18 MED ORDER — SODIUM CHLORIDE 0.9 % IV BOLUS (SEPSIS)
500.0000 mL | Freq: Once | INTRAVENOUS | Status: AC
  Administered 2017-05-18: 500 mL via INTRAVENOUS

## 2017-05-18 NOTE — Consult Note (Addendum)
Referring Provider:  Dr. Lindell Noe Primary Care Physician:  Vivi Barrack, MD Primary Gastroenterologist:  Althia Forts  Reason for Consultation:  Cirrhosis, jaundice, pancytopenia  HPI: William [name] is a 56 y.o. male with past medical history of alcohol cirrhosis with the EGD in September 2017 showed grade 1 esophageal varices, chronic kidney disease, polyclonal gammopathy, and cytopenia was initially seen as a walk-in in the family medicine clinic for fatigue. He was found to have hemoglobin of 5.4. He was sent to ER for further evaluation. GI is consulted for further management.  Patient seen and examined at bedside. Sitting comfortable in the recliner. He denied any active GI symptoms. Denied any blood in the stool or black stool. Denied nausea or vomiting. Point to patient, he is not eating good mainly because of dental issues resulting in 10-15 pound weight loss over last 6 months. Denied any fever or chills. Denied dysphagia or odynophagia. Denied diarrhea or constipation. Last bowel movement was on the day of admission.  Previous GI workup ---------------------- - EGD September 2017 by Dr. Penelope Coop grade 1 esophageal varices. - Colonoscopy by Dr. Erskine Emery in 2011 showed 5 mm sessile polyp in the descending colon, biopsy showed no colonic mucosa.  - EGD in June 2011 showed grade 2 esophageal varices.   Past Medical History:  Diagnosis Date  . Anemia   . Chronic kidney disease   . Cirrhosis of liver ([place])   . COMPRESSION FRACTURE, LUMBAR VERTEBRAE 07/29/2010   Qualifier: Diagnosis of  By: Anabel Bene    . Hypertension   . Prostate cancer [facility])     Past Surgical History:  Procedure Laterality Date  . ACHILLES TENDON REPAIR    . ESOPHAGOGASTRODUODENOSCOPY (EGD) WITH PROPOFOL N/A 09/13/2016   Procedure: ESOPHAGOGASTRODUODENOSCOPY (EGD) WITH PROPOFOL;  Surgeon: Wonda Horner, MD;  Location: [facility] ENDOSCOPY;  Service: Endoscopy;  Laterality: N/A;    Prior to Admission  medications   Medication Sig Start Date End Date Taking? Authorizing Provider  allopurinol (ZYLOPRIM) 100 MG tablet TAKE ONE TABLET BY MOUTH ONCE DAILY 01/05/17  Yes Vivi Barrack, MD  colchicine (COLCRYS) 0.6 MG tablet Take 0.5 tablets (0.3 mg total) by mouth daily. 02/19/16  Yes Vivi Barrack, MD  folic acid (FOLVITE) 1 MG tablet TAKE ONE TABLET BY MOUTH DAILY 09/21/16  Yes Vivi Barrack, MD  IRON PO Take 1 tablet by mouth daily.   Yes [provider]  omeprazole (PRILOSEC) 40 MG capsule TAKE ONE CAPSULE BY MOUTH ONCE DAILY 08/09/16  Yes Vivi Barrack, MD  propranolol (INDERAL) 40 MG tablet TAKE ONE TABLET BY MOUTH TWICE DAILY 08/09/16  Yes Vivi Barrack, MD  spironolactone (ALDACTONE) 25 MG tablet TAKE ONE TABLET BY MOUTH ONCE DAILY 09/21/16  Yes Vivi Barrack, MD  thiamine (VITAMIN B-1) 100 MG tablet Take 100 mg by mouth daily.   Yes [provider]  baclofen (LIORESAL) 10 MG tablet TAKE ONE TABLET BY MOUTH THREE TIMES DAILY AS NEEDED FOR MUSCLE SPASMS Patient not taking: Reported on 05/17/2017 08/09/16   Vivi Barrack, MD  diclofenac sodium (VOLTAREN) 1 % GEL Apply 2 g topically 4 (four) times daily. Patient not taking: Reported on 05/17/2017 01/06/15   Vivi Barrack, MD  gabapentin (NEURONTIN) 100 MG capsule Take 1 capsule (100 mg total) by mouth 3 (three) times daily. Patient not taking: Reported on 05/17/2017 02/08/16   Vivi Barrack, MD  traMADol (ULTRAM) 50 MG tablet Take 1 tablet (50 mg total)  by mouth every 8 (eight) hours as needed. Patient not taking: Reported on 05/17/2017 05/29/15   Vivi Barrack, MD  Vitamin D, Ergocalciferol, (DRISDOL) 50000 units CAPS capsule Take 1 capsule (50,000 Units total) by mouth every 7 (seven) days. Patient not taking: Reported on 05/17/2017 09/14/16   [place] Bing, DO    Scheduled Meds: . folic acid  1 mg Oral Daily  . multivitamin with minerals  1 tablet Oral Daily  . pantoprazole  40 mg Oral Daily  . potassium  chloride  40 mEq Oral BID  . propranolol  20 mg Oral BID  . sodium chloride flush  3 mL Intravenous Q12H  . thiamine  100 mg Oral Daily   Or  . thiamine  100 mg Intravenous Daily   Continuous Infusions: . sodium chloride 120 mL/hr at 05/18/17 0334  . sodium chloride    . magnesium sulfate 1 - 4 g bolus IVPB Stopped (05/18/17 0939)  . sodium chloride     PRN Meds:.LORazepam **OR** LORazepam  Allergies as of 05/17/2017  . (No Known Allergies)    Family History  Problem Relation Age of Onset  . Kidney disease Mother   . Arthritis Mother   . Hypertension Father     Social History   Social History  . Marital status: Single    Spouse name: N/A  . Number of children: N/A  . Years of education: N/A   Occupational History  . Not on file.   Social History Main Topics  . Smoking status: Never Smoker  . Smokeless tobacco: Never Used  . Alcohol use Yes     Comment: 1 pint per week  . Drug use: No  . Sexual activity: Not on file   Other Topics Concern  . Not on file   Social History Narrative  . No narrative on file    Review of Systems: Review of Systems  Constitutional: Positive for malaise/fatigue and weight loss. Negative for chills and fever.  HENT: Negative for ear discharge, ear pain, hearing loss and tinnitus.   Eyes: Negative for blurred vision and double vision.  Respiratory: Negative for cough, hemoptysis and sputum production.   Cardiovascular: Negative for chest pain and palpitations.  Gastrointestinal: Negative for abdominal pain, blood in stool, diarrhea, heartburn, melena, nausea and vomiting.  Genitourinary: Negative for dysuria and urgency.  Musculoskeletal: Positive for myalgias. Negative for joint pain and neck pain.  Skin: Negative for itching and rash.  Neurological: Positive for weakness. Negative for sensory change, speech change and seizures.  Endo/Heme/Allergies: Bruises/bleeds easily.  Psychiatric/Behavioral: Positive for substance abuse.  Negative for hallucinations and suicidal ideas.    Physical Exam: Vital signs: Vitals:   05/18/17 0625 05/18/17 1010  BP: 101/68 100/61  Pulse: 95 (!) 101  Resp:  18  Temp: 98.8 F (37.1 C) 98.2 F (36.8 C)   Last BM Date: 05/16/17 Physical Exam  Constitutional: He is oriented to person, place, and time. He appears well-developed and well-nourished. No distress.  HENT:  Head: Normocephalic and atraumatic.  Nose: Nose normal.  Mouth/Throat: Oropharynx is clear and moist. No oropharyngeal exudate.  Eyes: Conjunctivae and EOM are normal. Scleral icterus is present.  Neck: Normal range of motion. Neck supple. No thyromegaly present.  Cardiovascular: Normal rate, regular rhythm and normal heart sounds.   No murmur heard. Pulmonary/Chest: Effort normal and breath sounds normal. No respiratory distress.  Abdominal: Soft. Bowel sounds are normal. He exhibits no distension. There is no tenderness. There is no  rebound and no guarding.  Musculoskeletal: Normal range of motion. He exhibits no edema or tenderness.  Neurological: He is alert and oriented to person, place, and time.  Skin: Skin is dry. No erythema.  Psychiatric: He has a normal mood and affect. His behavior is normal. Thought content normal.    GI:  Lab Results:  Recent Labs  05/17/17 1225 05/17/17 1346 05/18/17 0739  WBC  --  2.7* 1.7*  HGB 5.4* 6.6* 6.7*  HCT  --  19.7* 19.6*  PLT  --  75* 37*   BMET  Recent Labs  05/17/17 1346 05/18/17 0739  NA 128* 132*  K 3.7 3.4*  CL 94* 101  CO2 15* 21*  GLUCOSE 127* 113*  BUN 14 18  CREATININE 1.54* 1.31*  CALCIUM 8.5* 7.5*   LFT  Recent Labs  05/17/17 1612 05/18/17 0739  PROT 7.2 6.1*  ALBUMIN 2.8* 2.4*  AST 93* 91*  ALT 25 22  ALKPHOS 152* 126  BILITOT 4.3* 5.2*  BILIDIR 2.3*  --   IBILI 2.0*  --    PT/INR  Recent Labs  05/18/17 0739  LABPROT 18.9*  INR 1.56     Studies/Results: Dg Chest Port 1 View  Result Date: 05/17/2017 CLINICAL  DATA:  Lactic acidosis. Shortness of breath on exertion. History of hypertension. EXAM: PORTABLE CHEST 1 VIEW COMPARISON:  09/09/2016 FINDINGS: The heart size and mediastinal contours are within normal limits. Both lungs are clear. The visualized skeletal structures are unremarkable. IMPRESSION: No active disease. Electronically Signed   By: Lucienne Capers M.D.   On: 05/17/2017 21:21    Impression/Plan: - Normocytic anemia with pancytopenia. No overt bleeding. Iron studies shows anemia of chronic disease. - Alcoholic cirrhosis. MELD score 24. Patient with ongoing alcohol use - History of small esophageal varices -   Last EGD in September 2017 - Pancytopenia, probably from underlying liver disease and ongoing alcohol use  Recommendations ------------------------ - No plan for endoscopic intervention at this point in absence of overt bleeding. Follow stool for occult blood. - Absolute alcohol abstinence advised. - Ultrasound liver - Hematology  following. They may consider Bone Marrow  biopsy prior to discharge - Monitor LFTs and CBC. Transfuse to keep hemoglobin around 7. Avoid over transfusion. - GI will follow.    LOS: 1 day   Otis Brace  MD, FACP 05/18/2017, 10:16 AM  Pager [phone] If no answer or after 5 PM call [phone]

## 2017-05-18 NOTE — Progress Notes (Signed)
Events in the last 24 hours noted. Patient hospitalized with symptomatic anemia without evidence of clear-cut GI bleeding.  His laboratory data were reviewed in the last 4 years and his protein studies do not suggest a monoclonal plasma cell disorder. These findings are likely reactive related to his liver disease.  His anemia is rather severe and recurring at this time. There is no clear-cut explanation other than cirrhosis of the liver and splenic sequestration. This will results in profound neutropenia and thrombocytopenia but his degree of anemia is slightly out of proportion to her to expect from sequestration.  His repeat anemia workup is currently pending but there is no clear-cut vitamin deficiency noted, then I would recommend obtaining a bone marrow biopsy while he is hospitalized. I would ask the primary team to have IR do a bone marrow biopsy to complete his workup before his discharge.  Please call with any questions regarding this gentleman.

## 2017-05-18 NOTE — Care Management Note (Addendum)
Case Management Note  Patient Details  Name: William [name] MRN: [id number] Date of Birth: 05-29-1961  Subjective/Objective:       Admitted with symptomatic anemia , history of alcohol cirrhosis/EGD in September 2017 showed grade 1 esophageal varices, chronic kidney disease, polyclonal gammopathy, and cytopenia. Resides alone. Independent with ADL's and no CME usage PTA.   [name] (Daughter)     [phone]      PCP: Dimas Chyle  Action/Plan: Pt with pancytopenia.... Bone marrow bx scheduled for 05/19/2017 per IR.....oncology following.....  GI following...Marland KitchenMarland KitchenCM continue to follow for disposition needs.  Expected Discharge Date:                  Expected Discharge Plan:  Home/Self Care  In-House Referral:     Discharge planning Services  CM Consult  Post Acute Care Choice:    Choice offered to:     DME Arranged:    DME Agency:     HH Arranged:    HH Agency:     Status of Service:  In process, will continue to follow  If discussed at Long Length of Stay Meetings, dates discussed:    Additional Comments:  Sharin Mons, RN 05/18/2017, 3:32 PM

## 2017-05-18 NOTE — Progress Notes (Signed)
Nutrition Brief Note  RD consulted for assessment of nutrition needs and status.   Wt Readings from Last 15 Encounters:  05/17/17 162 lb (73.5 kg)  05/17/17 162 lb (73.5 kg)  10/13/16 168 lb 11.2 oz (76.5 kg)  09/16/16 175 lb (79.4 kg)  09/12/16 175 lb 8 oz (79.6 kg)  09/09/16 162 lb (73.5 kg)  02/08/16 173 lb 12.8 oz (78.8 kg)  05/29/15 169 lb 9 oz (76.9 kg)  01/06/15 168 lb (76.2 kg)  09/16/14 173 lb (78.5 kg)  06/09/14 170 lb (77.1 kg)  05/28/14 175 lb (79.4 kg)  02/12/14 174 lb (78.9 kg)  10/14/13 181 lb (82.1 kg)  08/21/13 183 lb (83 kg)   William [name] is a 56 y.o. male presenting with fatigue. PMH is significant for chronic anemia, alcoholic cirrhosis, esophageal varices, polyclonal elevation of gamma globulins, HTN, CKD3, and reported prostate cancer.   Pt admitted with symptomatic anemia/pancytopenia with an initial hgb of 5.4.  Spoke with pt, who reports good appetite. He did not eat his breakfast this morning because it was cold. He is looking forward to lunch. Pt typically consumes 2 meals per day PTA (Brunch: eggs and bacon or bowl of frosted flakes, dinner: hot dogs or meat, starch, and vegetable).   Pt estimates he has lost 20# over the past 1-2 months, with a UBW of 170#. However, this is not consistent with wt hx. Noted Wt has been stable over the past 9 months. Pt suspects muscle loss because he "got lazy" and stopped lifting weights.   Nutrition-Focused physical exam completed. Findings are no fat depletion, no muscle depletion, and no edema. Examination of hair, skin, mouth, hair, and nails did not reveal any micronutrient deficiencies.   Body mass index is 22.59 kg/m. Patient meets criteria for normal weight range based on current BMI.   Current diet order is Heart Healthy, patient is consuming approximately n/a% of meals at this time. Labs and medications reviewed.   Albumin has a half-life of 21 days and is strongly affected by stress response and  inflammatory process, therefore, do not expect to see an improvement in this lab value during acute hospitalization. When a patient presents with low albumin, it is likely skewed due to the acute inflammatory response.  Unless it is suspected that patient had poor PO intake or malnutrition prior to admission, then RD should not be consulted solely for low albumin. Note that low albumin is no longer used to diagnose malnutrition; [place] uses the new malnutrition guidelines published by the American Society for Parenteral and Enteral Nutrition (A.S.P.E.N.) and the Academy of Nutrition and Dietetics (AND).    No nutrition interventions warranted at this time. If nutrition issues arise, please consult RD.   Naketa Daddario A. Jimmye Norman, RD, LDN, CDE Pager: [phone] After hours Pager: [phone]

## 2017-05-18 NOTE — Evaluation (Signed)
Physical Therapy Evaluation Patient Details Name: William [name] MRN: [id number] DOB: 07-15-1961 Today's Date: 05/18/2017   History of Present Illness  56 yo M with hx of alcoholic cirrhosis with esophageal varices, CKD3, alcohol abuse, polyclonal gammopathy, pancytopenia, and alleged prostate cancer, presenting with symptomatic anemia/pancytopenia with an initial hgb of 5.4.  Clinical Impression  Pt admitted with above diagnosis. Pt currently with functional limitations due to the deficits listed below (see PT Problem List). Pt will benefit from skilled PT to increase their independence and safety with mobility to allow discharge to the venue listed below.  Pt with low Hgb, but asymptomatic and therefore ambulated in hallway 150' with IV pole.  Will follow acutely, but do not feel he will need any post acute care PT.     Follow Up Recommendations No PT follow up    Equipment Recommendations  None recommended by PT    Recommendations for Other Services       Precautions / Restrictions Precautions Precautions: None      Mobility  Bed Mobility Overal bed mobility: Modified Independent                Transfers Overall transfer level: Needs assistance   Transfers: Sit to/from Stand Sit to Stand: Supervision         General transfer comment: S for safety.  Pt denies dizziness or lightheadedness  Ambulation/Gait Ambulation/Gait assistance: Min guard;Supervision Ambulation Distance (Feet): 150 Feet Assistive device:  (pushed IV pole) Gait Pattern/deviations: Step-through pattern     General Gait Details: S initially, but min /guard with changing direction and as gait progressed as pt reports legs were feeling heavy.  Stairs            Wheelchair Mobility    Modified Rankin (Stroke Patients Only)       Balance Overall balance assessment: No apparent balance deficits (not formally assessed)                                            Pertinent Vitals/Pain Pain Assessment: No/denies pain    Home Living Family/patient expects to be discharged to:: Private residence Living Arrangements: Alone Available Help at Discharge: Friend(s);Available PRN/intermittently Type of Home: Mobile home Home Access: Stairs to enter   Entrance Stairs-Number of Steps: 5 Home Layout: One level Home Equipment: Cane - single point;Bedside commode      Prior Function Level of Independence: Independent         Comments: Reports he did have to use a cane to ambulate into the hospital due to feeling weak, but normally doesn't     Hand Dominance   Dominant Hand: Left    Extremity/Trunk Assessment   Upper Extremity Assessment Upper Extremity Assessment: Overall WFL for tasks assessed    Lower Extremity Assessment Lower Extremity Assessment: Overall WFL for tasks assessed       Communication   Communication: No difficulties  Cognition Arousal/Alertness: Awake/alert Behavior During Therapy: WFL for tasks assessed/performed Overall Cognitive Status: Within Functional Limits for tasks assessed                                        General Comments General comments (skin integrity, edema, etc.): Pt with no c/o feeling faint, dizzy, lightheaded.    Exercises  Assessment/Plan    PT Assessment Patient needs continued PT services  PT Problem List Decreased activity tolerance;Decreased balance;Decreased mobility       PT Treatment Interventions Gait training;Stair training;Therapeutic activities;Therapeutic exercise;Functional mobility training;Balance training    PT Goals (Current goals can be found in the Care Plan section)  Acute Rehab PT Goals Patient Stated Goal: feel stronger and go home PT Goal Formulation: With patient Time For Goal Achievement: 05/25/17 Potential to Achieve Goals: Good    Frequency Min 3X/week   Barriers to discharge        Co-evaluation                AM-PAC PT "6 Clicks" Daily Activity  Outcome Measure Difficulty turning over in bed (including adjusting bedclothes, sheets and blankets)?: None Difficulty moving from lying on back to sitting on the side of the bed? : None Difficulty sitting down on and standing up from a chair with arms (e.g., wheelchair, bedside commode, etc,.)?: None Help needed moving to and from a bed to chair (including a wheelchair)?: None Help needed walking in hospital room?: A Little Help needed climbing 3-5 steps with a railing? : A Little 6 Click Score: 22    End of Session Equipment Utilized During Treatment: Gait belt Activity Tolerance: Patient tolerated treatment well Patient left: in chair;with call bell/phone within reach Nurse Communication: Mobility status PT Visit Diagnosis: Unsteadiness on feet (R26.81)    Time: 0677-0340 PT Time Calculation (min) (ACUTE ONLY): 24 min   Charges:   PT Evaluation $PT Eval Low Complexity: 1 Procedure PT Treatments $Gait Training: 8-22 mins   PT G Codes:        William [name], [place] Pager [phone] 05/18/2017   Galen Manila 05/18/2017, 11:21 AM

## 2017-05-18 NOTE — Progress Notes (Signed)
Family Medicine Teaching Service Daily Progress Note Intern Pager: [phone]  Patient name: William [name] Medical record number: [id number] Date of birth: May 16, 1961 Age: 56 y.o. Gender: male  Primary Care Provider: Vivi Barrack, MD Consultants: hematology Code Status: FULL   Pt Overview and Major Events to Date:  5/23 admitted with symptomatic anemia  Assessment and Plan: William [name] is a 56 y.o. male presenting with fatigue. PMH is significant for chronic anemia, alcoholic cirrhosis, esophageal varices, polyclonal elevation of gamma globulins, HTN, CKD3, and reported prostate cancer.   Fatigue/symptomatic anemia, pancytopenia: Known history of anemia/ pancytopenia, has seen hematology most recently this fall. Etiology thought to be secondary to cirrhosis of the liver causing splenomegaly and subsequent sequestrations. Additionally, has a history of polyclonal elevation of gamma globulins felt to be a reactive process rather than a plasma cell disorder by hematology. Denies any acute symptoms of bleeding.  No recent illness to suggest viral mediated myelosuppression.  HIV negative in 08/2016. S/p 2U PRBC, 1 additional unit ordered. Vitamin B12 WNL. Folate low at 2.7.  INR 1.56.  -haptoglobin, parvovirus, peripheral smear, RPR pending  -FOBT  -EKG -discussed case with Dr. Alen Blew, he recommends bone marrow biopsy -IR bone marrow biopsy  Lactic acidosis: Resolved. Metabolic acidosis consistent with dehydration. Tachycardic due to symptomatic anemia, infectious etiology less likely as patient denies any fever, cough, new shortness of breath, abdominal pain, dysuria. CXR without signs of acute infection.  -monitor vital signs  AKI: Cr 1.54>1.31.  baseline Cr appears to be 1.06. Likely secondary to dehydration due to vomiting and poor by mouth intake with concomitant alcoholism. -Consider further workup if not improving with hydration  Hyponatremia: resolving. History of the same,  likely due to poor by mouth intake secondary to alcoholism.  Baseline Na 130-134.  -daily CMP  Alcoholic cirrhosis with esophageal varices: LFT elevation with AST greater than 2 times ALT, consistent with alcoholic cirrhosis. INR elevated to 1.56. Tbili 4.3, DBili 2.3, IBili 2.0. Mg 1.0.  Patient endorses current drinking, last drink 1 day prior to admission half a pint of liquor. On propranolol for prophylaxis of variceal hemorrhage. Additionally on spironolactone. MELD score 27.  -Hold spironolactone in the setting of dehydration -Continue propranolol at a reduced dose -CIWA scoring, 0.5 mg Ativan when necessary due to liver dysfunction -Folic acid, MVI, Thiamine -Replete Mg daily x 5 days -consult GI regarding bili elevation: agree with liver US  History of prostate cancer: on problem list. No further details in our chart.  PSA in 08/2016: 0.37. Patient denies this ever being a problem for him.  -PCP to follow up as an outpatient   Intermittent dyspnea:  patient reports shortness of breath with exertion for some time. Normal respiratory effort and rate on our exam. Likely due to symptomatic anemia, however if continued despite transfusion could consider cardiac causes including nonischemic cardiomyopathy secondary to alcoholism. -Consider echo if persistent  Hyperpigmentation of tongue: patient with hyperpigmented patches of tongue, he cannot recall duration of this or previously diagnoses. Denies ever smoking. Patient now endorsing sore ulcerations on the sides of tongue. ?geographic tongue vs petechial lesions 2/2 thrombocytopenia vs melanoma -consider outpatient derm for possible biopsy/evaluation -RPR pending  FEN/GI: MIVF, Normal diet Prophylaxis: SCDs in setting of anemia and thrombocytopenia  Disposition: continued inpatient management of anemia.   Subjective:  Patient feels better this morning after receiving blood. He reports some painful ulcerations on the sides of his  tongue. Reports a remote history of gonorrhea, denies syphilis.  Objective: Temp:  [97.9 F (36.6 C)-99.2 F (37.3 C)] 98.8 F (37.1 C) (05/24 0625) Pulse Rate:  [70-131] 95 (05/24 0625) Resp:  [18-25] 18 (05/24 0510) BP: (90-121)/(48-86) 101/68 (05/24 0625) SpO2:  [96 %-100 %] 100 % (05/24 0625) Weight:  [162 lb (73.5 kg)] 162 lb (73.5 kg) (05/23 1318) Physical Exam: General: Pale, well appearing male sitting up in bed in NAD.  Cardiovascular: RRR, no murmur Respiratory: CTAB, easy WOB  Abdomen: SNTND, no organomegaly, +BS Extremities: no edema, warm, SCDs in place  Laboratory:  Recent Labs Lab 05/17/17 1225 05/17/17 1346  WBC  --  2.7*  HGB 5.4* 6.6*  HCT  --  19.7*  PLT  --  75*    Recent Labs Lab 05/17/17 1346 05/17/17 1612  NA 128*  --   K 3.7  --   CL 94*  --   CO2 15*  --   BUN 14  --   CREATININE 1.54*  --   CALCIUM 8.5*  --   PROT  --  7.2  BILITOT  --  4.3*  ALKPHOS  --  152*  ALT  --  25  AST  --  93*  GLUCOSE 127*  --    INR 1.56  Imaging/Diagnostic Tests: Dg Chest Port 1 View  Result Date: 05/17/2017 CLINICAL DATA:  Lactic acidosis. Shortness of breath on exertion. History of hypertension. EXAM: PORTABLE CHEST 1 VIEW COMPARISON:  09/09/2016 FINDINGS: The heart size and mediastinal contours are within normal limits. Both lungs are clear. The visualized skeletal structures are unremarkable. IMPRESSION: No active disease. Electronically Signed   By: Lucienne Capers M.D.   On: 05/17/2017 21:21    Sela Hilding, MD 05/18/2017, 7:21 AM PGY-1, [place] Intern pager: [phone], text pages welcome

## 2017-05-18 NOTE — Consult Note (Signed)
Chief Complaint: Patient was seen in consultation today for bone marrow biopsy Chief Complaint  Patient presents with  . Fatigue   at the request of  Dr Leodis Rains  Referring Physician(s): Dr Leodis Rains  Supervising Physician: Arne Cleveland  Patient Status: [facility] - In-pt  History of Present Illness: William [name] is a 56 y.o. male    Fatigue Anemia Pancytopenia CKD; polyclonal gammopathy Cirrhosis; splenic sequestration Oncology consulted and has asked for bone marrow biopsy  Now scheduled for same 5/25  Past Medical History:  Diagnosis Date  . Anemia   . Chronic kidney disease   . Cirrhosis of liver ([place])   . COMPRESSION FRACTURE, LUMBAR VERTEBRAE 07/29/2010   Qualifier: Diagnosis of  By: Anabel Bene    . Hypertension   . Prostate cancer [facility])     Past Surgical History:  Procedure Laterality Date  . ACHILLES TENDON REPAIR    . ESOPHAGOGASTRODUODENOSCOPY (EGD) WITH PROPOFOL N/A 09/13/2016   Procedure: ESOPHAGOGASTRODUODENOSCOPY (EGD) WITH PROPOFOL;  Surgeon: Wonda Horner, MD;  Location: [facility] ENDOSCOPY;  Service: Endoscopy;  Laterality: N/A;    Allergies: Patient has no known allergies.  Medications: Prior to Admission medications   Medication Sig Start Date End Date Taking? Authorizing Provider  allopurinol (ZYLOPRIM) 100 MG tablet TAKE ONE TABLET BY MOUTH ONCE DAILY 01/05/17  Yes Vivi Barrack, MD  colchicine (COLCRYS) 0.6 MG tablet Take 0.5 tablets (0.3 mg total) by mouth daily. 02/19/16  Yes Vivi Barrack, MD  folic acid (FOLVITE) 1 MG tablet TAKE ONE TABLET BY MOUTH DAILY 09/21/16  Yes Vivi Barrack, MD  IRON PO Take 1 tablet by mouth daily.   Yes [provider]  omeprazole (PRILOSEC) 40 MG capsule TAKE ONE CAPSULE BY MOUTH ONCE DAILY 08/09/16  Yes Vivi Barrack, MD  propranolol (INDERAL) 40 MG tablet TAKE ONE TABLET BY MOUTH TWICE DAILY 08/09/16  Yes Vivi Barrack, MD  spironolactone (ALDACTONE) 25 MG tablet TAKE ONE TABLET BY  MOUTH ONCE DAILY 09/21/16  Yes Vivi Barrack, MD  thiamine (VITAMIN B-1) 100 MG tablet Take 100 mg by mouth daily.   Yes [provider]  baclofen (LIORESAL) 10 MG tablet TAKE ONE TABLET BY MOUTH THREE TIMES DAILY AS NEEDED FOR MUSCLE SPASMS Patient not taking: Reported on 05/17/2017 08/09/16   Vivi Barrack, MD  diclofenac sodium (VOLTAREN) 1 % GEL Apply 2 g topically 4 (four) times daily. Patient not taking: Reported on 05/17/2017 01/06/15   Vivi Barrack, MD  gabapentin (NEURONTIN) 100 MG capsule Take 1 capsule (100 mg total) by mouth 3 (three) times daily. Patient not taking: Reported on 05/17/2017 02/08/16   Vivi Barrack, MD  traMADol (ULTRAM) 50 MG tablet Take 1 tablet (50 mg total) by mouth every 8 (eight) hours as needed. Patient not taking: Reported on 05/17/2017 05/29/15   Vivi Barrack, MD  Vitamin D, Ergocalciferol, (DRISDOL) 50000 units CAPS capsule Take 1 capsule (50,000 Units total) by mouth every 7 (seven) days. Patient not taking: Reported on 05/17/2017 09/14/16   Overlea Bing, DO     Family History  Problem Relation Age of Onset  . Kidney disease Mother   . Arthritis Mother   . Hypertension Father     Social History   Social History  . Marital status: Single    Spouse name: N/A  . Number of children: N/A  . Years of education: N/A   Social History Main Topics  .  Smoking status: Never Smoker  . Smokeless tobacco: Never Used  . Alcohol use Yes     Comment: 1 pint per week  . Drug use: No  . Sexual activity: Not Asked   Other Topics Concern  . None   Social History Narrative  . None    Review of Systems: A 12 point ROS discussed and pertinent positives are indicated in the HPI above.  All other systems are negative.  Review of Systems  Constitutional: Positive for activity change and fatigue. Negative for appetite change and fever.  Respiratory: Positive for shortness of breath. Negative for cough.   Cardiovascular: Negative for chest pain.   Gastrointestinal: Negative for abdominal pain.  Neurological: Positive for weakness.  Psychiatric/Behavioral: Negative for behavioral problems and confusion.    Vital Signs: BP 101/70 (BP Location: Right Arm)   Pulse 91   Temp 98.3 F (36.8 C) (Oral)   Resp 16   Ht 5' 11"  (1.803 m)   Wt 162 lb (73.5 kg)   SpO2 100%   BMI 22.59 kg/m   Physical Exam  Constitutional: He is oriented to person, place, and time.  Cardiovascular: Normal rate and regular rhythm.   Pulmonary/Chest: Effort normal and breath sounds normal.  Abdominal: Soft. Bowel sounds are normal.  Musculoskeletal: Normal range of motion.  Neurological: He is alert and oriented to person, place, and time.  Skin: Skin is warm and dry.  Psychiatric: He has a normal mood and affect. His behavior is normal. Judgment and thought content normal.  Nursing note and vitals reviewed.   Mallampati Score:  MD Evaluation Airway: WNL Heart: WNL Abdomen: WNL Chest/ Lungs: WNL ASA  Classification: 2 Mallampati/Airway Score: Two  Imaging: Dg Chest Port 1 View  Result Date: 05/17/2017 CLINICAL DATA:  Lactic acidosis. Shortness of breath on exertion. History of hypertension. EXAM: PORTABLE CHEST 1 VIEW COMPARISON:  09/09/2016 FINDINGS: The heart size and mediastinal contours are within normal limits. Both lungs are clear. The visualized skeletal structures are unremarkable. IMPRESSION: No active disease. Electronically Signed   By: Lucienne Capers M.D.   On: 05/17/2017 21:21    Labs:  CBC:  Recent Labs  09/14/16 0811 09/16/16 1204 05/17/17 1225 05/17/17 1346 05/18/17 0739  WBC 1.4* 2.2*  --  2.7* 1.7*  HGB 7.2* 8.2* 5.4* 6.6* 6.7*  HCT 22.4* 24.7*  --  19.7* 19.6*  PLT 74* 99*  --  75* 37*    COAGS:  Recent Labs  09/09/16 1937 05/18/17 0739  INR 1.20 1.56    BMP:  Recent Labs  09/14/16 0811 09/16/16 1204 05/17/17 1346 05/18/17 0739  NA 135 130* 128* 132*  K 4.0 4.5 3.7 3.4*  CL 104 100 94* 101    CO2 24 19* 15* 21*  GLUCOSE 79 97 127* 113*  BUN 8 10 14 18   CALCIUM 7.8* 8.5* 8.5* 7.5*  CREATININE 0.85 0.99 1.54* 1.31*  GFRNONAA >60 84 48* 58*  GFRAA >60 >89 56* >60    LIVER FUNCTION TESTS:  Recent Labs  09/09/16 1937 09/10/16 0436 05/17/17 1612 05/18/17 0739  BILITOT 1.9* 3.1* 4.3* 5.2*  AST 144* 125* 93* 91*  ALT 26 22 25 22   ALKPHOS 138* 119 152* 126  PROT 7.0 6.1* 7.2 6.1*  ALBUMIN 3.1* 2.8* 2.8* 2.4*    TUMOR MARKERS: No results for input(s): AFPTM, CEA, CA199, CHROMGRNA in the last 8760 hours.  Assessment and Plan:  Pancytopenia Scheduled for bone marrow bx in am Risks and Benefits discussed with  the patient including, but not limited to bleeding, infection, damage to adjacent structures or low yield requiring additional tests. All of the patient's questions were answered, patient is agreeable to proceed. Consent signed and in chart.   Thank you for this interesting consult.  I greatly enjoyed meeting William [name] and look forward to participating in their care.  A copy of this report was sent to the requesting provider on this date.  Electronically Signed: Lavonia Drafts, PA-C 05/18/2017, 1:11 PM   I spent a total of 20 Minutes    in face to face in clinical consultation, greater than 50% of which was counseling/coordinating care for bone marrow bx

## 2017-05-19 ENCOUNTER — Inpatient Hospital Stay (HOSPITAL_COMMUNITY): Payer: Medicare PPO

## 2017-05-19 DIAGNOSIS — D61818 Other pancytopenia: Principal | ICD-10-CM

## 2017-05-19 DIAGNOSIS — E871 Hypo-osmolality and hyponatremia: Secondary | ICD-10-CM

## 2017-05-19 DIAGNOSIS — I10 Essential (primary) hypertension: Secondary | ICD-10-CM

## 2017-05-19 DIAGNOSIS — Z8719 Personal history of other diseases of the digestive system: Secondary | ICD-10-CM

## 2017-05-19 DIAGNOSIS — F1010 Alcohol abuse, uncomplicated: Secondary | ICD-10-CM

## 2017-05-19 DIAGNOSIS — N183 Chronic kidney disease, stage 3 (moderate): Secondary | ICD-10-CM

## 2017-05-19 LAB — TYPE AND SCREEN
ABO/RH(D): B POS
Antibody Screen: NEGATIVE
Unit division: 0
Unit division: 0
Unit division: 0

## 2017-05-19 LAB — CBC
HEMATOCRIT: 20.5 % — AB (ref 39.0–52.0)
HEMOGLOBIN: 7.1 g/dL — AB (ref 13.0–17.0)
MCH: 30.7 pg (ref 26.0–34.0)
MCHC: 34.6 g/dL (ref 30.0–36.0)
MCV: 88.7 fL (ref 78.0–100.0)
Platelets: 44 10*3/uL — ABNORMAL LOW (ref 150–400)
RBC: 2.31 MIL/uL — ABNORMAL LOW (ref 4.22–5.81)
RDW: 20.7 % — ABNORMAL HIGH (ref 11.5–15.5)
WBC: 1.6 10*3/uL — ABNORMAL LOW (ref 4.0–10.5)

## 2017-05-19 LAB — BPAM RBC
BLOOD PRODUCT EXPIRATION DATE: 201806132359
BLOOD PRODUCT EXPIRATION DATE: 201806132359
Blood Product Expiration Date: 201806132359
ISSUE DATE / TIME: 201805240038
ISSUE DATE / TIME: 201805240332
ISSUE DATE / TIME: 201805241017
UNIT TYPE AND RH: 7300
Unit Type and Rh: 7300
Unit Type and Rh: 7300

## 2017-05-19 LAB — COMPREHENSIVE METABOLIC PANEL
ALK PHOS: 121 U/L (ref 38–126)
ALT: 22 U/L (ref 17–63)
AST: 104 U/L — AB (ref 15–41)
Albumin: 2.3 g/dL — ABNORMAL LOW (ref 3.5–5.0)
Anion gap: 8 (ref 5–15)
BILIRUBIN TOTAL: 5.3 mg/dL — AB (ref 0.3–1.2)
BUN: 16 mg/dL (ref 6–20)
CALCIUM: 7.3 mg/dL — AB (ref 8.9–10.3)
CO2: 17 mmol/L — ABNORMAL LOW (ref 22–32)
CREATININE: 1.14 mg/dL (ref 0.61–1.24)
Chloride: 108 mmol/L (ref 101–111)
GFR calc Af Amer: >60 mL/min (ref >60)
Glucose, Bld: 99 mg/dL (ref 65–99)
Potassium: 4 mmol/L (ref 3.5–5.1)
Sodium: 133 mmol/L — ABNORMAL LOW (ref 135–145)
TOTAL PROTEIN: 5.9 g/dL — AB (ref 6.5–8.1)

## 2017-05-19 LAB — MAGNESIUM: Magnesium: 1.5 mg/dL — ABNORMAL LOW (ref 1.7–2.4)

## 2017-05-19 LAB — PHOSPHORUS: Phosphorus: <1 mg/dL — CL (ref 2.5–4.6)

## 2017-05-19 MED ORDER — MIDAZOLAM HCL 2 MG/2ML IJ SOLN
INTRAMUSCULAR | Status: AC
  Filled 2017-05-19: qty 2

## 2017-05-19 MED ORDER — SODIUM PHOSPHATES 45 MMOLE/15ML IV SOLN
30.0000 mmol | Freq: Once | INTRAVENOUS | Status: AC
  Administered 2017-05-19: 30 mmol via INTRAVENOUS
  Filled 2017-05-19: qty 10

## 2017-05-19 MED ORDER — FENTANYL CITRATE (PF) 100 MCG/2ML IJ SOLN
INTRAMUSCULAR | Status: AC | PRN
  Administered 2017-05-19: 50 ug via INTRAVENOUS
  Administered 2017-05-19: 25 ug via INTRAVENOUS

## 2017-05-19 MED ORDER — MIDAZOLAM HCL 2 MG/2ML IJ SOLN
INTRAMUSCULAR | Status: AC | PRN
  Administered 2017-05-19 (×2): 1 mg via INTRAVENOUS

## 2017-05-19 MED ORDER — FENTANYL CITRATE (PF) 100 MCG/2ML IJ SOLN
INTRAMUSCULAR | Status: AC
  Filled 2017-05-19: qty 2

## 2017-05-19 MED ORDER — LIDOCAINE HCL 1 % IJ SOLN
INTRAMUSCULAR | Status: AC
  Filled 2017-05-19: qty 20

## 2017-05-19 NOTE — Sedation Documentation (Signed)
Procedure staerted, no complaints form pt at this time.

## 2017-05-19 NOTE — Progress Notes (Signed)
-  Patient not in the room. Currently getting CT-guided bone marrow biopsy. We will follow up later today or tomorrow.

## 2017-05-19 NOTE — Progress Notes (Signed)
OT Cancellation Note  Patient Details Name: William [name] MRN: [id number] DOB: [birth year]   Cancelled Treatment:    Reason Eval/Treat Not Completed: Patient at procedure or test/ unavailable. Pt off the floor. Will check back for evaluation as schedule allows.  [place] 05/19/2017, 8:29 AM  Hulda Humphrey OTR/L [phone]

## 2017-05-19 NOTE — Progress Notes (Signed)
PT Cancellation Note  Patient Details Name: William [name] MRN: [id number] DOB: Nov 23, 1961   Cancelled Treatment:    Reason Eval/Treat Not Completed: Medical issues which prohibited therapy Pt currently on bedrest following procedure. Will check back as schedule allows and bedrest removed.   Nicky Pugh, PT, DPT  Acute Rehabilitation Services  Pager: [phone]    [place] Mel[place] 05/19/2017, 10:05 AM

## 2017-05-19 NOTE — Sedation Documentation (Signed)
Patient is resting comfortably. 

## 2017-05-19 NOTE — Progress Notes (Signed)
Family Medicine Teaching Service Daily Progress Note Intern Pager: [phone]  Patient name: William [name] Medical record number: [id number] Date of birth: 1961-07-02 Age: 56 y.o. Gender: male  Primary Care Provider: Vivi Barrack, MD Consultants: hematology Code Status: FULL   Pt Overview and Major Events to Date:  5/23 admitted with symptomatic anemia 5/24 GI and Heme consulted   Assessment and Plan: William [name] is a 56 y.o. male presenting with fatigue. PMH is significant for chronic anemia, alcoholic cirrhosis, esophageal varices, polyclonal elevation of gamma globulins, HTN, CKD3, and reported prostate cancer.   Fatigue/symptomatic anemia, pancytopenia: Known history of anemia/ pancytopenia, has seen hematology most recently this fall. Etiology thought to be secondary to cirrhosis of the liver causing splenomegaly and subsequent sequestrations.  HIV negative in 08/2016. S/p 3U PRBC. Vitamin B12 WNL. Folate low at 2.7.  INR 1.56.  -haptoglobin, parvovirus, peripheral smear pending  -FOBT  -IR bone marrow biopsy  AKI: Cr 1.54>1.31>1.33>1.14.  baseline Cr appears to be 1.06. Likely secondary to dehydration due to vomiting and poor by mouth intake with concomitant alcoholism. -daily BMP  Hyponatremia: resolving. History of the same, likely due to poor by mouth intake secondary to alcoholism.  Baseline Na 130-134.  -daily CMP  Hypophosphatemia: likely 2/2 malnutrition due to alcoholism. S/p 37mol IV 5/24.  -replete with [id number]   Alcoholic cirrhosis with esophageal varices: LFT elevation with AST greater than 2 times ALT, consistent with alcoholic cirrhosis. INR elevated to 1.56. Tbili 4.3, DBili 2.3, IBili 2.0. Mg 1.0. On propranolol for prophylaxis of variceal hemorrhage. Additionally on spironolactone. MELD score 27. Cirrhotic liver on [place] focal lesions, splenomegaly also present.  -Hold spironolactone in the setting of dehydration -Continue propranolol at a  reduced dose -CIWA scoring, 0.5 mg Ativan when necessary due to liver dysfunction -Folic acid, MVI, Thiamine -Replete Mg daily x 5 days -GI following, appreciate recs  History of prostate cancer: on problem list. No further details in our chart.  PSA in 08/2016: 0.37. Patient denies this ever being a problem for him.  -PCP to follow up as an outpatient   Intermittent dyspnea:  patient reports shortness of breath with exertion for some time. Normal respiratory effort and rate on our exam. Likely due to symptomatic anemia, however if continued despite transfusion could consider cardiac causes including nonischemic cardiomyopathy secondary to alcoholism. -Consider echo if persistent  Hyperpigmentation of tongue: patient with hyperpigmented patches of tongue, he cannot recall duration of this or previously diagnoses. Denies ever smoking. Patient now endorsing sore ulcerations on the sides of tongue. ?geographic tongue vs petechial lesions 2/2 thrombocytopenia vs melanoma. RPR negative.  -consider outpatient derm for possible biopsy/evaluation  FEN/GI: MIVF, Normal diet Prophylaxis: SCDs in setting of anemia and thrombocytopenia  Disposition: continued inpatient management of anemia.   Subjective:  Patient feels well, did not enjoy cold breakfast.   Objective: Temp:  [98.2 F (36.8 C)-98.9 F (37.2 C)] 98.7 F (37.1 C) (05/25 0512) Pulse Rate:  [91-103] 97 (05/25 0512) Resp:  [16-20] 20 (05/25 0512) BP: (93-110)/(61-72) 93/64 (05/25 0512) SpO2:  [96 %-100 %] 99 % (05/25 0512) Physical Exam: General: Pale, well appearing male sitting up in bed in NAD.  Cardiovascular: RRR, no murmur Respiratory: CTAB, easy WOB  Abdomen: SNTND, no organomegaly, +BS Extremities: no edema, warm, SCDs in place  Laboratory:  Recent Labs Lab 05/17/17 1346 05/18/17 0739 05/18/17 1954  WBC 2.7* 1.7* 1.9*  HGB 6.6* 6.7* 8.8*  HCT 19.7* 19.6* 25.7*  PLT  75* 37* 48*    Recent Labs Lab  05/17/17 1346 05/17/17 1612 05/18/17 0739 05/18/17 1954  NA 128*  --  132* 132*  K 3.7  --  3.4* 4.0  CL 94*  --  101 102  CO2 15*  --  21* 15*  BUN 14  --  18 18  CREATININE 1.54*  --  1.31* 1.33*  CALCIUM 8.5*  --  7.5* 7.8*  PROT  --  7.2 6.1* 6.6  BILITOT  --  4.3* 5.2* 6.5*  ALKPHOS  --  152* 126 144*  ALT  --  _0 AST  --  93* 91* 116*  GLUCOSE 127*  --  113* 111*   INR 1.56  Imaging/Diagnostic Tests: US Abdomen Complete  Result Date: 05/18/2017 CLINICAL DATA:  The cirrhosis. History of hypertension, chronic kidney disease, and prostate cancer. EXAM: ABDOMEN ULTRASOUND COMPLETE COMPARISON:  09/10/2016 FINDINGS: Gallbladder: Gallbladder wall is thickened, 4.3 mm. Layering sludge and tiny stones are present. Stones are too small to measure. No pericholecystic fluid or sonographic Murphy's. Common bile duct: Diameter: 6.1 mm Liver: Nodular surface of the liver. Liver is heterogeneous and echogenic. No focal liver lesions are identified. IVC: Partially obscured by bowel gas. Pancreas: Visualized portion unremarkable. Spleen: The spleen is enlarged. Accessory spleen noted measuring 1.8 cm. Right Kidney: Length: 9.6 cm. Echogenicity within normal limits. No mass or hydronephrosis visualized. Left Kidney: Length: 10.1 cm. Echogenicity within normal limits. No mass or hydronephrosis visualized. Abdominal aorta: No aneurysm visualized. Other findings: Ascites IMPRESSION: 1. Cirrhotic morphology of the liver. 2. No focal liver lesions are identified. 3. Splenomegaly consistent with portal venous hypertension. 4. Ascites. 5. Sludge and stones within the gallbladder. Gallbladder wall thickening is nonspecific in the setting of cirrhosis. Electronically Signed   By: Nolon Nations M.D.   On: 05/18/2017 16:21   Dg Chest Port 1 View  Result Date: 05/17/2017 CLINICAL DATA:  Lactic acidosis. Shortness of breath on exertion. History of hypertension. EXAM: PORTABLE CHEST 1 VIEW COMPARISON:   09/09/2016 FINDINGS: The heart size and mediastinal contours are within normal limits. Both lungs are clear. The visualized skeletal structures are unremarkable. IMPRESSION: No active disease. Electronically Signed   By: Lucienne Capers M.D.   On: 05/17/2017 21:21    Sela Hilding, MD 05/19/2017, 7:22 AM PGY-1, [place] Intern pager: [phone], text pages welcome

## 2017-05-19 NOTE — Evaluation (Signed)
Occupational Therapy Evaluation and Discharge Patient Details Name: William [name] MRN: [id number] DOB: 1961-04-11 Today's Date: 05/19/2017    History of Present Illness 56 yo M with hx of alcoholic cirrhosis with esophageal varices, CKD3, alcohol abuse, polyclonal gammopathy, pancytopenia, and alleged prostate cancer, presenting with symptomatic anemia/pancytopenia with an initial hgb of 5.4.   Clinical Impression   PTA Pt independent in ADL and mobility with occasional use of SPC. Pt currently at baseline, just feeling all around fatigue. See performance level below. Pt provided with energy conservation handout and reviewed in full with Pt. Pt has glasses in room to be able to read it again. OT discussed with Pt about falling, Pt stated he'd fallen a couple months ago but is not interested in having someone evaluate his house for fall risks to increase safety at this time. "Maybe down the road" Pt had no questions or concerns for OT, OT to sign off at this time. Thank you for this referral.     Follow Up Recommendations  No OT follow up;Supervision - Intermittent    Equipment Recommendations  None recommended by OT    Recommendations for Other Services       Precautions / Restrictions Precautions Precautions: None Restrictions Weight Bearing Restrictions: No      Mobility Bed Mobility Overal bed mobility: Modified Independent             General bed mobility comments: increased time required  Transfers Overall transfer level: Needs assistance Equipment used: None Transfers: Sit to/from Stand Sit to Stand: Supervision         General transfer comment: S for safety.  Pt denies dizziness or lightheadedness    Balance Overall balance assessment: No apparent balance deficits (not formally assessed)                                         ADL either performed or assessed with clinical judgement   ADL Overall ADL's : At baseline                                        General ADL Comments: Ambulated using IV pole, able to don/doff socks sitting EOB.      Vision Baseline Vision/History: Wears glasses Wears Glasses: Reading only Patient Visual Report: No change from baseline Vision Assessment?: No apparent visual deficits     Perception     Praxis      Pertinent Vitals/Pain Pain Assessment: No/denies pain     Hand Dominance Left   Extremity/Trunk Assessment Upper Extremity Assessment Upper Extremity Assessment: Generalized weakness   Lower Extremity Assessment Lower Extremity Assessment: Defer to PT evaluation   Cervical / Trunk Assessment Cervical / Trunk Assessment: Other exceptions Cervical / Trunk Exceptions: rounded shoulder and forward head   Communication Communication Communication: No difficulties   Cognition Arousal/Alertness: Awake/alert Behavior During Therapy: WFL for tasks assessed/performed Overall Cognitive Status: Within Functional Limits for tasks assessed                                     General Comments       Exercises     Shoulder Instructions      Home Living Family/patient expects to be discharged to::  Private residence Living Arrangements: Alone Available Help at Discharge: Friend(s);Available PRN/intermittently Type of Home: Mobile home Home Access: Stairs to enter Entrance Stairs-Number of Steps: 5 Entrance Stairs-Rails: Right;Left;Can reach both Home Layout: One level     Bathroom Shower/Tub: Corporate investment banker: Standard Bathroom Accessibility: Yes How Accessible: Accessible via walker Home Equipment: Cane - single point;Bedside commode;Walker - 2 wheels          Prior Functioning/Environment Level of Independence: Independent        Comments: Reports he did have to use a cane to ambulate into the hospital due to feeling weak, but normally doesn't        OT Problem List:        OT  Treatment/Interventions:      OT Goals(Current goals can be found in the care plan section) Acute Rehab OT Goals Patient Stated Goal: feel stronger and go home OT Goal Formulation: With patient Time For Goal Achievement: 05/25/17 Potential to Achieve Goals: Good  OT Frequency:     Barriers to D/C:            Co-evaluation              AM-PAC PT "6 Clicks" Daily Activity     Outcome Measure Help from another person eating meals?: None Help from another person taking care of personal grooming?: None Help from another person toileting, which includes using toliet, bedpan, or urinal?: None Help from another person bathing (including washing, rinsing, drying)?: None Help from another person to put on and taking off regular upper body clothing?: None Help from another person to put on and taking off regular lower body clothing?: None 6 Click Score: 24   End of Session Equipment Utilized During Treatment: Gait belt Nurse Communication: Mobility status  Activity Tolerance: Patient tolerated treatment well Patient left: in bed;with call bell/phone within reach;with bed alarm set  OT Visit Diagnosis: Muscle weakness (generalized) (M62.81)                Time: 7741-4239 OT Time Calculation (min): 31 min Charges:  OT General Charges $OT Visit: 1 Procedure OT Evaluation $OT Eval Low Complexity: 1 Procedure OT Treatments $Self Care/Home Management : 8-22 mins G-Codes:     Hulda Humphrey OTR/L [place] 05/19/2017, 4:02 PM

## 2017-05-19 NOTE — Progress Notes (Signed)
[facility] Gastroenterology Progress Note  William [name] 56 y.o. [birth year]  CC:   Cirrhosis, jaundice, pancytopenia   Subjective: No acute issues overnight. No bowel movement since admission. Status post bone marrow biopsy today. drop in hemoglobin noted. Tolerating diet. Denied nausea or vomiting  ROS : Negative for chest pain or shortness of breath   Objective: Vital signs in last 24 hours: Vitals:   05/19/17 0923 05/19/17 1010  BP: (!) 85/44 (!) 90/57  Pulse: 93 90  Resp: 20 17  Temp:  98.3 F (36.8 C)    Physical Exam:  Constitutional: He is oriented to person, place, and time. He appears well-developed and well-nourished. No distress.  HENT:  Head: Normocephalic and atraumatic.  Mouth/Throat: Oropharynx is clear and moist. No oropharyngeal exudate.  Eyes: Conjunctivae and EOM are normal. Scleral icterus is present.  Neck: Normal range of motion. Neck supple. No thyromegaly present.  Cardiovascular: Normal rate, regular rhythm and normal heart sounds.   No murmur heard. Pulmonary/Chest: Effort normal and breath sounds normal. No respiratory distress.  Abdominal: Soft. Bowel sounds are normal. He exhibits no distension. There is no tenderness. There is no rebound and no guarding.  Musculoskeletal: He exhibits no edema or tenderness.  Neurological: He is alert and oriented to person, place, and time.  Skin: Skin is warm. No erythema.  Psychiatric: He has a normal mood and affect. His behavior is normal. Thought content normal    Lab Results:  Recent Labs  05/18/17 0739 05/18/17 1954 05/19/17 0618 05/19/17 1010  NA 132* 132*  --  133*  K 3.4* 4.0  --  4.0  CL 101 102  --  108  CO2 21* 15*  --  17*  GLUCOSE 113* 111*  --  99  BUN 18 18  --  16  CREATININE 1.31* 1.33*  --  1.14  CALCIUM 7.5* 7.8*  --  7.3*  MG 1.4*  --  1.5*  --   PHOS <1.0*  --  <1.0*  --     Recent Labs  05/18/17 1954 05/19/17 1010  AST 116* 104*  ALT 24 22  ALKPHOS 144* 121  BILITOT  6.5* 5.3*  PROT 6.6 5.9*  ALBUMIN 2.7* 2.3*    Recent Labs  05/18/17 1954 05/19/17 0618  WBC 1.9* 1.6*  HGB 8.8* 7.1*  HCT 25.7* 20.5*  MCV 89.2 88.7  PLT 48* 44*    Recent Labs  05/18/17 0739  LABPROT 18.9*  INR 1.56      Assessment/Plan: - Normocytic anemia with pancytopenia. No overt bleeding. Iron studies shows anemia of chronic disease. - Alcoholic cirrhosis. MELD score 24 as of 05/18/2017. Patient with ongoing alcohol use - History of small esophageal varices -   Last EGD in September 2017 - Pancytopenia, probably from underlying liver disease and ongoing alcohol use  Recommendations ------------------------ - Drop in hemoglobin noted. No bowel movement since admission. Stool for occult blood not collected because patient is not having bowel movement. No plan for endoscopic intervention at this point in absence of overt bleeding. - He is status post bone marrow biopsy today - Absolute alcohol abstinence advised. - Ultrasound liver showed cirrhosis but no acute changes - Monitor LFTs and CBC. Transfuse to keep hemoglobin around 7. Avoid over transfusion. - GI will follow.    Otis Brace MD, [place] 05/19/2017, 11:41 AM  Pager [phone]  If no answer or after 5 PM call [phone]

## 2017-05-19 NOTE — Progress Notes (Signed)
CRITICAL VALUE ALERT  Critical Value:  Phosphorus less than 1.  Date & Time Notied:  05/19/2017 @ 2824  Provider Notified: Andy Gauss, MD  Orders Received/Actions taken: MD stated he will take care of it.

## 2017-05-19 NOTE — Procedures (Signed)
Interventional Radiology Procedure Note  Procedure: CT guided aspirate and core biopsy of right posterior iliac bone Complications: None Recommendations: - Bedrest supine x 1 hrs - OTC's PRN  Pain - Follow biopsy results  Signed,  Toddrick Sanna S. Ranette Luckadoo, DO    

## 2017-05-20 LAB — BASIC METABOLIC PANEL
Anion gap: 8 (ref 5–15)
BUN: 17 mg/dL (ref 6–20)
CALCIUM: 7.5 mg/dL — AB (ref 8.9–10.3)
CO2: 18 mmol/L — ABNORMAL LOW (ref 22–32)
CREATININE: 1.33 mg/dL — AB (ref 0.61–1.24)
Chloride: 105 mmol/L (ref 101–111)
GFR calc Af Amer: >60 mL/min (ref >60)
GFR, EST NON AFRICAN AMERICAN: 57 mL/min — AB (ref >60)
GLUCOSE: 122 mg/dL — AB (ref 65–99)
Potassium: 3.7 mmol/L (ref 3.5–5.1)
Sodium: 131 mmol/L — ABNORMAL LOW (ref 135–145)

## 2017-05-20 LAB — PROTIME-INR
INR: 1.51
Prothrombin Time: 18.4 seconds — ABNORMAL HIGH (ref 11.4–15.2)

## 2017-05-20 LAB — URINALYSIS, ROUTINE W REFLEX MICROSCOPIC
GLUCOSE, UA: NEGATIVE mg/dL
Hgb urine dipstick: NEGATIVE
Ketones, ur: NEGATIVE mg/dL
LEUKOCYTES UA: NEGATIVE
Nitrite: NEGATIVE
PH: 5 (ref 5.0–8.0)
Protein, ur: NEGATIVE mg/dL
Specific Gravity, Urine: 1.017 (ref 1.005–1.030)

## 2017-05-20 LAB — CBC
HCT: 20.5 % — ABNORMAL LOW (ref 39.0–52.0)
Hemoglobin: 7 g/dL — ABNORMAL LOW (ref 13.0–17.0)
MCH: 30.4 pg (ref 26.0–34.0)
MCHC: 34.1 g/dL (ref 30.0–36.0)
MCV: 89.1 fL (ref 78.0–100.0)
PLATELETS: 37 10*3/uL — AB (ref 150–400)
RBC: 2.3 MIL/uL — ABNORMAL LOW (ref 4.22–5.81)
RDW: 20.6 % — AB (ref 11.5–15.5)
WBC: 1.9 10*3/uL — ABNORMAL LOW (ref 4.0–10.5)

## 2017-05-20 LAB — OCCULT BLOOD X 1 CARD TO LAB, STOOL: FECAL OCCULT BLD: NEGATIVE

## 2017-05-20 LAB — PHOSPHORUS: Phosphorus: 1.3 mg/dL — ABNORMAL LOW (ref 2.5–4.6)

## 2017-05-20 LAB — MAGNESIUM: Magnesium: 1.7 mg/dL (ref 1.7–2.4)

## 2017-05-20 MED ORDER — POLYETHYLENE GLYCOL 3350 17 G PO PACK
17.0000 g | PACK | Freq: Every day | ORAL | Status: DC
  Administered 2017-05-20: 17 g via ORAL
  Filled 2017-05-20: qty 1

## 2017-05-20 MED ORDER — SODIUM PHOSPHATES 45 MMOLE/15ML IV SOLN
30.0000 mmol | Freq: Once | INTRAVENOUS | Status: AC
  Administered 2017-05-20: 30 mmol via INTRAVENOUS
  Filled 2017-05-20: qty 10

## 2017-05-20 MED ORDER — PHENOL 1.4 % MT LIQD: 1.0000 | OROMUCOSAL | Status: DC | PRN

## 2017-05-20 NOTE — Progress Notes (Signed)
[facility] Gastroenterology Progress Note  William [name] 56 y.o. [birth year]  CC:   Cirrhosis, jaundice, pancytopenia   Subjective: No acute issues overnight.  Bowel movement this morning which was formed. Discussed with the nursing staff. No evidence of black tarry stool or bright blood in the stool. d. Tolerating diet. Denied nausea or vomiting  ROS : Negative for chest pain or shortness of breath. Had fever 100.1 this morning   Objective: Vital signs in last 24 hours: Vitals:   05/20/17 0540 05/20/17 0959  BP: (!) 97/57 (!) 94/57  Pulse: 85   Resp: 18   Temp: 100.1 F (37.8 C)     Physical Exam:  Constitutional: He is oriented to person, place, and time. He appears well-developed and well-nourished. No distress.  HENT:  Head: Normocephalic and atraumatic.  Mouth/Throat: Oropharynx is clear and moist. No oropharyngeal exudate.  Eyes: Conjunctivae and EOM are normal. Scleral icterus is present.  Neck: Normal range of motion. Neck supple. No thyromegaly present.  Cardiovascular: Normal rate, regular rhythm and normal heart sounds.   No murmur heard. Pulmonary/Chest: Effort normal and breath sounds normal. No respiratory distress.  Abdominal: Soft. Bowel sounds are normal. He exhibits no distension. There is no tenderness. There is no rebound and no guarding.  Musculoskeletal: He exhibits no edema or tenderness.  Neurological: He is alert and oriented to person, place, and time.  Skin: Skin is warm. No erythema.  Psychiatric: He has a normal mood and affect. His behavior is normal. Thought content normal    Lab Results:  Recent Labs  05/19/17 0618 05/19/17 1010 05/20/17 0352  NA  --  133* 131*  K  --  4.0 3.7  CL  --  108 105  CO2  --  17* 18*  GLUCOSE  --  99 122*  BUN  --  16 17  CREATININE  --  1.14 1.33*  CALCIUM  --  7.3* 7.5*  MG 1.5*  --  1.7  PHOS <1.0*  --  1.3*    Recent Labs  05/18/17 1954 05/19/17 1010  AST 116* 104*  ALT 24 22  ALKPHOS 144*  121  BILITOT 6.5* 5.3*  PROT 6.6 5.9*  ALBUMIN 2.7* 2.3*    Recent Labs  05/19/17 0618 05/20/17 0352  WBC 1.6* 1.9*  HGB 7.1* 7.0*  HCT 20.5* 20.5*  MCV 88.7 89.1  PLT 44* 37*    Recent Labs  05/18/17 0739 05/20/17 0917  LABPROT 18.9* 18.4*  INR 1.56 1.51      Assessment/Plan: - Normocytic anemia with pancytopenia. No overt bleeding. Iron studies shows anemia of chronic disease. - Alcoholic cirrhosis. MELD score 24 as of 05/18/2017. Patient with ongoing alcohol use Prior to admission - History of small esophageal varices -   Last EGD in September 2017 - Pancytopenia, probably from underlying liver disease and ongoing alcohol use - Fever 100.1.   Recommendations ------------------------ - Hemoglobin relatively stable compared to yesterday. Bowel movement today was formed without any black tarry stool or bright blood in the stool. - No plan for endoscopic intervention at this point in absence of overt bleeding.   - He is status post bone marrow biopsy - report pending. - Follow stool for occult blood - Absolute alcohol abstinence advised. - Ultrasound liver showed cirrhosis and ascites but no acute changes - If he continues to have a fever, recommend diagnostic paracentesis with fluid analysis. Patient currently does not have any abdominal pain or any other GI symptoms. - Monitor  LFTs and CBC. Transfuse to keep hemoglobin around 7. Avoid over transfusion. - GI will follow.    Otis Brace MD, [place] 05/20/2017, 11:48 AM  Pager [phone]  If no answer or after 5 PM call [phone]

## 2017-05-20 NOTE — Progress Notes (Signed)
Tech arrived in room and offered Pt a bath. Pt stated that he is hoping to leave today and would like to take care of bath at home. Tech asked Pt to notify her if he changed his mind. Pt acknowledged.

## 2017-05-20 NOTE — Progress Notes (Signed)
Family Medicine Teaching Service Daily Progress Note Intern Pager: [phone]  Patient name: William [name] Medical record number: [id number] Date of birth: [birth year] Age: 56 y.o. Gender: male  Primary Care Provider: Vivi Barrack, MD Consultants: hematology Code Status: FULL   Pt Overview and Major Events to Date:  5/23 admitted with symptomatic anemia 5/24 GI and Heme consulted   Assessment and Plan: William [name] is a 56 y.o. male presenting with fatigue. PMH is significant for chronic anemia, alcoholic cirrhosis, esophageal varices, polyclonal elevation of gamma globulins, HTN, CKD3, and reported prostate cancer.   Fatigue/symptomatic anemia, pancytopenia: Known history of anemia/ pancytopenia, has seen hematology most recently this fall. Etiology thought to be secondary to cirrhosis of the liver causing splenomegaly and subsequent sequestrations. HIV negative in 08/2016. S/p 3U PRBC. Vitamin B12 WNL. Folate low at 2.7.  INR 1.51. RPR negative. S/p IR Bone Marrow biopsy  -haptoglobin, parvovirus pending  -FOBT  Pending   -Bone marrow biopsy results pending  -per GI transfuse to keep hemoglobin around 7, avoid over transfusion - continue following CBC  - outpatient fu with oncology vs. Inpatient   CKD3:  Scr 1.33 stable throughout admission. Baseline Cr 1- 1.3. Initial AKI now resolved likely secondary to dehydration due to vomiting and poor by mouth intake with concomitant alcoholism. - continue to follow serum creatinine   Chronic hyponatremia: Baseline Na 130-134.  -daily CMP  Hypophosphatemia: likely 2/2 malnutrition due to alcoholism. S/p 1mol IV 5/24 and 5/25   -replete with 342ml IV today  - F/U tomorrow AM   Hypomagnesemia: likely 2/2 malnutrition due to alcoholism. Mag 1.7 - Continue to replete today  - Follow up tomorrow AM   Alcoholic cirrhosis with esophageal varices: LFT elevation with AST greater than 2 times ALT, consistent with alcoholic cirrhosis.  INR elevated to 1.56. Tbili 4.3, DBili 2.3, IBili 2.0. Mg 1.0. On propranolol for prophylaxis of variceal hemorrhage. Additionally on spironolactone. MELD score 27. Cirrhotic liver on [place] focal lesions, splenomegaly also present.  -Hold spironolactone in the setting of dehydration -Continue propranolol at a reduced dose -CIWA scoring, 0.5 mg Ativan when necessary due to liver dysfunction -Folic acid, MVI, Thiamine -Replete Mg daily x 5 days -GI following, appreciate recs  History of prostate cancer: on problem list. No further details in our chart.  PSA in 08/2016: 0.37. Patient denies this ever being a problem for him.  -PCP to follow up as an outpatient   Hyperpigmentation of tongue: complains of burning on tongue - ulceration on left side of tongue-  could be HSV vs. Candida. Other serious concerns include squamous cell carcinoma.    Also note hyperpigmented patches of tongue, he cannot recall duration of this or previously diagnoses. Denies ever smoking. Patient now endorsing sore ulcerations on the sides of tongue. ?geographic tongue vs petechial lesions 2/2 thrombocytopenia vs melanoma. RPR negative. - HSV swab of tongue ulcer  -consider outpatient derm for possible biopsy/evaluation - choloraseptic gargle   FEN/GI: MIVF, Normal diet Prophylaxis: SCDs in setting of anemia and thrombocytopenia  Disposition: continued inpatient management of anemia.   Subjective:    Objective: Temp:  [98.3 F (36.8 C)-100.1 F (37.8 C)] 100.1 F (37.8 C) (05/26 0540) Pulse Rate:  [85-96] 85 (05/26 0540) Resp:  [10-24] 18 (05/26 0540) BP: (82-116)/(44-84) 97/57 (05/26 0540) SpO2:  [95 %-100 %] 95 % (05/26 0540) Physical Exam: General: Pale, well appearing male sitting up in bed in NAD.  Cardiovascular: RRR, no murmur Respiratory: CTAB, easy  WOB  Abdomen: SNTND, no organomegaly, +BS Extremities: no edema, warm, SCDs in place  Laboratory:  Recent Labs Lab 05/18/17 1954  05/19/17 0618 05/20/17 0352  WBC 1.9* 1.6* 1.9*  HGB 8.8* 7.1* 7.0*  HCT 25.7* 20.5* 20.5*  PLT 48* 44* 37*    Recent Labs Lab 05/18/17 0739 05/18/17 1954 05/19/17 1010 05/20/17 0352  NA 132* 132* 133* 131*  K 3.4* 4.0 4.0 3.7  CL 101 102 108 105  CO2 21* 15* 17* 18*  BUN 18 18 16 17   CREATININE 1.31* 1.33* 1.14 1.33*  CALCIUM 7.5* 7.8* 7.3* 7.5*  PROT 6.1* 6.6 5.9*  --   BILITOT 5.2* 6.5* 5.3*  --   ALKPHOS 126 144* 121  --   ALT 22 24 22   --   AST 91* 116* 104*  --   GLUCOSE 113* 111* 99 122*   INR 1.51  Imaging/Diagnostic Tests: US Abdomen Complete  Result Date: 05/18/2017 CLINICAL DATA:  The cirrhosis. History of hypertension, chronic kidney disease, and prostate cancer. EXAM: ABDOMEN ULTRASOUND COMPLETE COMPARISON:  09/10/2016 FINDINGS: Gallbladder: Gallbladder wall is thickened, 4.3 mm. Layering sludge and tiny stones are present. Stones are too small to measure. No pericholecystic fluid or sonographic Murphy's. Common bile duct: Diameter: 6.1 mm Liver: Nodular surface of the liver. Liver is heterogeneous and echogenic. No focal liver lesions are identified. IVC: Partially obscured by bowel gas. Pancreas: Visualized portion unremarkable. Spleen: The spleen is enlarged. Accessory spleen noted measuring 1.8 cm. Right Kidney: Length: 9.6 cm. Echogenicity within normal limits. No mass or hydronephrosis visualized. Left Kidney: Length: 10.1 cm. Echogenicity within normal limits. No mass or hydronephrosis visualized. Abdominal aorta: No aneurysm visualized. Other findings: Ascites IMPRESSION: 1. Cirrhotic morphology of the liver. 2. No focal liver lesions are identified. 3. Splenomegaly consistent with portal venous hypertension. 4. Ascites. 5. Sludge and stones within the gallbladder. Gallbladder wall thickening is nonspecific in the setting of cirrhosis. Electronically Signed   By: Nolon Nations M.D.   On: 05/18/2017 16:21   Dg Chest Port 1 View  Result Date:  05/17/2017 CLINICAL DATA:  Lactic acidosis. Shortness of breath on exertion. History of hypertension. EXAM: PORTABLE CHEST 1 VIEW COMPARISON:  09/09/2016 FINDINGS: The heart size and mediastinal contours are within normal limits. Both lungs are clear. The visualized skeletal structures are unremarkable. IMPRESSION: No active disease. Electronically Signed   By: Lucienne Capers M.D.   On: 05/17/2017 21:21   Ct Bone Marrow Biopsy  Result Date: 05/19/2017 INDICATION: 56 year old male with pancytopenia EXAM: CT BIOPSY BONE MARROW MEDICATIONS: None. ANESTHESIA/SEDATION: Moderate (conscious) sedation was employed during this procedure. A total of Versed 2.0 mg and Fentanyl 75 mcg was administered intravenously. Moderate Sedation Time: 13 minutes. The patient's level of consciousness and vital signs were monitored continuously by radiology nursing throughout the procedure under my direct supervision. FLUOROSCOPY TIME:  CT COMPLICATIONS: None PROCEDURE: The procedure risks, benefits, and alternatives were explained to the patient. Questions regarding the procedure were encouraged and answered. The patient understands and consents to the procedure. Scout CT of the pelvis was performed for surgical planning purposes. The posterior pelvis was prepped with Betadinein a sterile fashion, and a sterile drape was applied covering the operative field. A sterile gown and sterile gloves were used for the procedure. Local anesthesia was provided with 1% Lidocaine. We targeted the right posterior iliac bone for biopsy. The skin and subcutaneous tissues were infiltrated with 1% lidocaine without epinephrine. A small stab incision was made with an 11  blade scalpel, and an 11 gauge Murphy needle was advanced with CT guidance to the posterior cortex. Manual forced was used to advance the needle through the posterior cortex and the stylet was removed. A bone marrow aspirate was retrieved and passed to a cytotechnologist in the room. The  Murphy needle was then advanced without the stylet for a core biopsy. The core biopsy was retrieved and also passed to a cytotechnologist. Manual pressure was used for hemostasis and a sterile dressing was placed. No complications were encountered no significant blood loss was encountered. Patient tolerated the procedure well and remained hemodynamically stable throughout. IMPRESSION: Status post CT-guided bone marrow biopsy, with tissue specimen sent to pathology for complete histopathologic analysis Signed, Dulcy Fanny. Earleen Newport, DO [facility] Radiology Electronically Signed   By: Corrie Mckusick D.O.   On: 05/19/2017 10:57    Tonette Bihari, MD 05/20/2017, 8:32 AM PGY-1, [place] Intern pager: [phone], text pages welcome

## 2017-05-21 DIAGNOSIS — K140 Glossitis: Secondary | ICD-10-CM

## 2017-05-21 LAB — RENAL FUNCTION PANEL
ANION GAP: 9 (ref 5–15)
Albumin: 2 g/dL — ABNORMAL LOW (ref 3.5–5.0)
BUN: 19 mg/dL (ref 6–20)
CHLORIDE: 105 mmol/L (ref 101–111)
CO2: 19 mmol/L — AB (ref 22–32)
Calcium: 7.6 mg/dL — ABNORMAL LOW (ref 8.9–10.3)
Creatinine, Ser: 1.28 mg/dL — ABNORMAL HIGH (ref 0.61–1.24)
Glucose, Bld: 119 mg/dL — ABNORMAL HIGH (ref 65–99)
Phosphorus: 2.5 mg/dL (ref 2.5–4.6)
Potassium: 3.4 mmol/L — ABNORMAL LOW (ref 3.5–5.1)
Sodium: 133 mmol/L — ABNORMAL LOW (ref 135–145)

## 2017-05-21 LAB — HEPATIC FUNCTION PANEL
ALBUMIN: 2.1 g/dL — AB (ref 3.5–5.0)
ALT: 24 U/L (ref 17–63)
AST: 106 U/L — ABNORMAL HIGH (ref 15–41)
Alkaline Phosphatase: 143 U/L — ABNORMAL HIGH (ref 38–126)
BILIRUBIN INDIRECT: 1.9 mg/dL — AB (ref 0.3–0.9)
Bilirubin, Direct: 2.7 mg/dL — ABNORMAL HIGH (ref 0.1–0.5)
TOTAL PROTEIN: 5.9 g/dL — AB (ref 6.5–8.1)
Total Bilirubin: 4.6 mg/dL — ABNORMAL HIGH (ref 0.3–1.2)

## 2017-05-21 LAB — CBC
HEMATOCRIT: 20 % — AB (ref 39.0–52.0)
HEMOGLOBIN: 6.8 g/dL — AB (ref 13.0–17.0)
MCH: 30.4 pg (ref 26.0–34.0)
MCHC: 34 g/dL (ref 30.0–36.0)
MCV: 89.3 fL (ref 78.0–100.0)
PLATELETS: 40 10*3/uL — AB (ref 150–400)
RBC: 2.24 MIL/uL — AB (ref 4.22–5.81)
RDW: 20.1 % — ABNORMAL HIGH (ref 11.5–15.5)
WBC: 1.6 10*3/uL — AB (ref 4.0–10.5)

## 2017-05-21 LAB — PROTIME-INR
INR: 1.53
PROTHROMBIN TIME: 18.5 s — AB (ref 11.4–15.2)

## 2017-05-21 LAB — HEMOGLOBIN AND HEMATOCRIT, BLOOD
HEMATOCRIT: 26.7 % — AB (ref 39.0–52.0)
HEMOGLOBIN: 9.2 g/dL — AB (ref 13.0–17.0)

## 2017-05-21 LAB — PREPARE RBC (CROSSMATCH)

## 2017-05-21 LAB — HUMAN PARVOVIRUS DNA DETECTION BY PCR: Parvovirus B19, PCR: NEGATIVE

## 2017-05-21 LAB — HIV ANTIBODY (ROUTINE TESTING W REFLEX): HIV Screen 4th Generation wRfx: NONREACTIVE

## 2017-05-21 MED ORDER — PROPRANOLOL HCL 40 MG PO TABS
40.0000 mg | ORAL_TABLET | Freq: Two times a day (BID) | ORAL | Status: DC
  Administered 2017-05-21 – 2017-05-22 (×2): 40 mg via ORAL
  Filled 2017-05-21 (×4): qty 1

## 2017-05-21 MED ORDER — SPIRONOLACTONE 25 MG PO TABS
50.0000 mg | ORAL_TABLET | Freq: Every day | ORAL | Status: DC
  Administered 2017-05-21 – 2017-05-22 (×2): 50 mg via ORAL
  Filled 2017-05-21 (×2): qty 2

## 2017-05-21 MED ORDER — FUROSEMIDE 20 MG PO TABS
20.0000 mg | ORAL_TABLET | Freq: Every day | ORAL | Status: DC
  Administered 2017-05-21 – 2017-05-22 (×2): 20 mg via ORAL
  Filled 2017-05-21 (×2): qty 1

## 2017-05-21 MED ORDER — POLYETHYLENE GLYCOL 3350 17 G PO PACK: 17.0000 g | PACK | Freq: Every day | ORAL | Status: DC | PRN

## 2017-05-21 MED ORDER — SODIUM CHLORIDE 0.9 % IV SOLN: Freq: Once | INTRAVENOUS | Status: DC

## 2017-05-21 NOTE — Progress Notes (Signed)
CRITICAL VALUE ALERT  Critical Value: Hemoglobin [id number]  Date & Time Notied:  05/21/17   Provider Notified: Family Medicine Resident  Orders Received/Actions taken: Blood transfusion to be ordered per pt

## 2017-05-21 NOTE — Progress Notes (Signed)
Subjective: The patient was seen and examined at bedside. He reports having light yellow colored stool 1 episode today morning. He denies worsening distention of the abdomen or associated shortness of breath. He states that he is able to walk to the restroom with little assistance.  Objective: Vital signs in last 24 hours: Temp:  [98.5 F (36.9 C)-99.3 F (37.4 C)] 99.3 F (37.4 C) (05/27 0609) Pulse Rate:  [80-100] 81 (05/27 0925) Resp:  [18-20] 18 (05/27 0609) BP: (96-110)/(60-74) 108/69 (05/27 0925) SpO2:  [97 %-100 %] 100 % (05/27 0609) Weight change:  Last BM Date: 05/20/17  PE: Obvious icterus, obvious pallor GENERAL: Not in acute distress, not using accessory muscles of respiration ABDOMEN: Slightly distended but not tense, nontender, normoactive bowel sounds, no obvious shifting dullness of fluid thrill EXTREMITIES: Mild pitting edema bilaterally lower extremities  Lab Results: Results for orders placed or performed during the hospital encounter of 05/17/17 (from the past 48 hour(s))  CBC     Status: Abnormal   Collection Time: 05/20/17  3:52 AM  Result Value Ref Range   WBC 1.9 (L) 4.0 - 10.5 K/uL   RBC 2.30 (L) 4.22 - 5.81 MIL/uL   Hemoglobin 7.0 (L) 13.0 - 17.0 g/dL   HCT 20.5 (L) 39.0 - 52.0 %   MCV 89.1 78.0 - 100.0 fL   MCH 30.4 26.0 - 34.0 pg   MCHC 34.1 30.0 - 36.0 g/dL   RDW 20.6 (H) 11.5 - 15.5 %   Platelets 37 (L) 150 - 400 K/uL    Comment: CONSISTENT WITH PREVIOUS RESULT  Basic metabolic panel     Status: Abnormal   Collection Time: 05/20/17  3:52 AM  Result Value Ref Range   Sodium 131 (L) 135 - 145 mmol/L   Potassium 3.7 3.5 - 5.1 mmol/L   Chloride 105 101 - 111 mmol/L   CO2 18 (L) 22 - 32 mmol/L   Glucose, Bld 122 (H) 65 - 99 mg/dL   BUN 17 6 - 20 mg/dL   Creatinine, Ser 1.33 (H) 0.61 - 1.24 mg/dL   Calcium 7.5 (L) 8.9 - 10.3 mg/dL   GFR calc non Af Amer 57 (L) >60 mL/min   GFR calc Af Amer >60 >60 mL/min    Comment: (NOTE) The eGFR has been  calculated using the CKD EPI equation. This calculation has not been validated in all clinical situations. eGFR's persistently <60 mL/min signify possible Chronic Kidney Disease.    Anion gap 8 5 - 15  Magnesium     Status: None   Collection Time: 05/20/17  3:52 AM  Result Value Ref Range   Magnesium 1.7 1.7 - 2.4 mg/dL  Phosphorus     Status: Abnormal   Collection Time: 05/20/17  3:52 AM  Result Value Ref Range   Phosphorus 1.3 (L) 2.5 - 4.6 mg/dL  Protime-INR     Status: Abnormal   Collection Time: 05/20/17  9:17 AM  Result Value Ref Range   Prothrombin Time 18.4 (H) 11.4 - 15.2 seconds   INR 1.51   HIV antibody     Status: None   Collection Time: 05/20/17 11:15 AM  Result Value Ref Range   HIV Screen 4th Generation wRfx Non Reactive Non Reactive    Comment: (NOTE) Performed At: [facility] [address] [place], [place] [id number] Lindon Romp MD SU:[id number]   Urinalysis, Routine w reflex microscopic     Status: Abnormal   Collection Time: 05/20/17  7:51 PM  Result  Value Ref Range   Color, Urine AMBER (A) YELLOW    Comment: BIOCHEMICALS MAY BE AFFECTED BY COLOR   APPearance CLEAR CLEAR   Specific Gravity, Urine 1.017 1.005 - 1.030   pH 5.0 5.0 - 8.0   Glucose, UA NEGATIVE NEGATIVE mg/dL   Hgb urine dipstick NEGATIVE NEGATIVE   Bilirubin Urine MODERATE (A) NEGATIVE   Ketones, ur NEGATIVE NEGATIVE mg/dL   Protein, ur NEGATIVE NEGATIVE mg/dL   Nitrite NEGATIVE NEGATIVE   Leukocytes, UA NEGATIVE NEGATIVE  CBC     Status: Abnormal   Collection Time: 05/21/17  4:58 AM  Result Value Ref Range   WBC 1.6 (L) 4.0 - 10.5 K/uL   RBC 2.24 (L) 4.22 - 5.81 MIL/uL   Hemoglobin 6.8 (LL) 13.0 - 17.0 g/dL    Comment: REPEATED TO VERIFY CRITICAL RESULT CALLED TO, READ BACK BY AND VERIFIED WITH: G.TOYIN,RN 4536 05/21/17 M.CAMPBELL    HCT 20.0 (L) 39.0 - 52.0 %   MCV 89.3 78.0 - 100.0 fL   MCH 30.4 26.0 - 34.0 pg   MCHC 34.0 30.0 - 36.0 g/dL   RDW 20.1 (H) 11.5 -  15.5 %   Platelets 40 (L) 150 - 400 K/uL    Comment: CONSISTENT WITH PREVIOUS RESULT  Renal function panel     Status: Abnormal   Collection Time: 05/21/17  4:58 AM  Result Value Ref Range   Sodium 133 (L) 135 - 145 mmol/L   Potassium 3.4 (L) 3.5 - 5.1 mmol/L   Chloride 105 101 - 111 mmol/L   CO2 19 (L) 22 - 32 mmol/L   Glucose, Bld 119 (H) 65 - 99 mg/dL   BUN 19 6 - 20 mg/dL   Creatinine, Ser 1.28 (H) 0.61 - 1.24 mg/dL   Calcium 7.6 (L) 8.9 - 10.3 mg/dL   Phosphorus 2.5 2.5 - 4.6 mg/dL   Albumin 2.0 (L) 3.5 - 5.0 g/dL   GFR calc non Af Amer >60 >60 mL/min   GFR calc Af Amer >60 >60 mL/min    Comment: (NOTE) The eGFR has been calculated using the CKD EPI equation. This calculation has not been validated in all clinical situations. eGFR's persistently <60 mL/min signify possible Chronic Kidney Disease.    Anion gap 9 5 - 15  Protime-INR     Status: Abnormal   Collection Time: 05/21/17  4:58 AM  Result Value Ref Range   Prothrombin Time 18.5 (H) 11.4 - 15.2 seconds   INR 1.53   Type and screen [place]     Status: None (Preliminary result)   Collection Time: 05/21/17  7:17 AM  Result Value Ref Range   ABO/RH(D) B POS    Antibody Screen NEG    Sample Expiration 05/24/2017    Unit Number [id number]    Blood Component Type RED CELLS,LR    Unit division 00    Status of Unit ALLOCATED    Transfusion Status OK TO TRANSFUSE    Crossmatch Result Compatible    Unit Number [id number]    Blood Component Type RED CELLS,LR    Unit division 00    Status of Unit ALLOCATED    Transfusion Status OK TO TRANSFUSE    Crossmatch Result Compatible   Prepare RBC     Status: None   Collection Time: 05/21/17  7:18 AM  Result Value Ref Range   Order Confirmation ORDER PROCESSED BY BLOOD BANK   Hepatic function panel     Status: Abnormal  Collection Time: 05/21/17  8:55 AM  Result Value Ref Range   Total Protein 5.9 (L) 6.5 - 8.1 g/dL   Albumin 2.1 (L) 3.5 - 5.0  g/dL   AST 106 (H) 15 - 41 U/L   ALT 24 17 - 63 U/L   Alkaline Phosphatase 143 (H) 38 - 126 U/L   Total Bilirubin 4.6 (H) 0.3 - 1.2 mg/dL   Bilirubin, Direct 2.7 (H) 0.1 - 0.5 mg/dL   Indirect Bilirubin 1.9 (H) 0.3 - 0.9 mg/dL    Studies/Results: No results found.  Medications: I have reviewed the patient's current medications.  Assessment: Cirrhosis of liver, ascites noted an ultrasound from 05/18/2017 Status post bone marrow biopsy on 05/19/2017 results are pending MELD score 19 as of today. Since renal function has not deteriorated since admission, recommend starting low-dose diuretics Lasix 20 mg a day and spironolactone 50 mg a day if blood pressure permits. Patient on propanolol 40 mg twice a day with heart rates in 80s for history of esophageal varices.   Plan: Will start patient on Lasix 20 mg a day and spironolactone 50 mg a day. Follow-up pathology report from bone marrow biopsy. Anemia unlikely from GI blood loss. If there is evidence of further febrile episodes recommend diagnostic paracentesis to rule out SBP.   Ronnette Juniper 05/21/2017, 10:51 AM   Pager [phone] If no answer or after 5 PM call [phone]

## 2017-05-21 NOTE — Progress Notes (Signed)
Family Medicine Teaching Service Daily Progress Note Intern Pager: [phone]  Patient name: William [name] Medical record number: [id number] Date of birth: [birth year] Age: 56 y.o. Gender: male  Primary Care Provider: Vivi Barrack, MD Consultants: hematology Code Status: FULL   Pt Overview and Major Events to Date:  5/23 admitted with symptomatic anemia 5/23 received 3 units of pRBCs  5/24 GI and Heme consulted  5/25 S/p Bone Marrow Biopsy  5/27 Hgb 6.8, transfused 2 units pRBCs   Assessment and Plan: William [name] is a 56 y.o. male presenting with fatigue. PMH is significant for chronic anemia, alcoholic cirrhosis, esophageal varices, polyclonal elevation of gamma globulins, HTN, CKD3, and reported prostate cancer.   Fatigue/symptomatic anemia, pancytopenia: Known history of anemia/ pancytopenia, has seen hematology most recently this fall. Etiology thought to be secondary to cirrhosis of the liver causing splenomegaly and subsequent sequestrations. HIV negative in 08/2016. S/p 3U PRBC. Folate low, replete.  -haptoglobin pending , parvovirus negative  -FOBT negative  -Bone marrow biopsy results pending  -per GI transfuse to keep hemoglobin around 7, avoid over transfusion -Hgb 6.8 today therefore transfused 2 units  PRBCs,  - continue following CBC  - consider calling oncology on Monday   CKD3:  Scr 1.33 stable throughout admission. Baseline Cr 1- 1.3. Initial AKI now resolved likely secondary to dehydration due to vomiting and poor by mouth intake with concomitant alcoholism. - continue to follow serum creatinine   Chronic hyponatremia: Baseline Na 130-134.  -daily CMP  Hypophosphatemia, resolved: likely 2/2 malnutrition due to alcoholism. S/p 58mol IV 5/24, 5/25, 5/26    - F/U tomorrow AM   Hypomagnesemia: likely 2/2 malnutrition due to alcoholism. 5 days of repletion - obtain mag level  - continue repletion   Alcoholic cirrhosis with esophageal varices: chronic  elevated LFTs consistent with alcoholic cirrhosis. INR elevated to 1.53.   Home propranolol for prophylaxis of variceal hemorrhage. Additionally on spironolactone, currently being held. MELD score 27. Cirrhotic liver on [place] focal lesions, splenomegaly also present.  -Holding spironolactone in the setting of low blood pressure  -Continue propranolol at home dose   -CIWA scoring, 0.5 mg Ativan when necessary due to liver dysfunction -Folic acid, MVI, Thiamine -Replete Mg daily x 5 days  -GI following, appreciate recs  History of prostate cancer: on problem list. No further details in our chart.  PSA in 08/2016: 0.37. Patient denies this ever being a problem for him.  -PCP to follow up as an outpatient   Tongue ulceration: complains of burning on tongue - ulceration on right side of tongue-  could be HSV vs. Candida. Other serious concerns include squamous cell carcinoma.    Also note hyperpigmented patches of tongue, he cannot recall duration of this or previously diagnoses. Denies ever smoking.  - HSV swab of tongue ulcer pending   -consider outpatient derm for possible biopsy/evaluation - choloraseptic gargle spray   FEN/GI: MIVF, Normal diet Prophylaxis: SCDs in setting of anemia and thrombocytopenia  Disposition: continued inpatient management of anemia.   Subjective:   Doing well this morning no complaints. Did indicates yesterday that his urine was darker - however no signs of bleeding on repeat UA   Objective: Temp:  [98.5 F (36.9 C)-99.3 F (37.4 C)] 99.3 F (37.4 C) (05/27 0609) Pulse Rate:  [80-100] 80 (05/27 0609) Resp:  [18-20] 18 (05/27 0609) BP: (94-110)/(57-74) 96/60 (05/27 0609) SpO2:  [97 %-100 %] 100 % (05/27 03875 Physical Exam: General: Pale, well appearing male sitting  up in bed in NAD.  Cardiovascular: RRR, no murmur Respiratory: CTAB, easy WOB  Abdomen: SNTND, no organomegaly, +BS Extremities: no edema, warm, SCDs in place  Laboratory:  Recent  Labs Lab 05/19/17 0618 05/20/17 0352 05/21/17 0458  WBC 1.6* 1.9* 1.6*  HGB 7.1* 7.0* 6.8*  HCT 20.5* 20.5* 20.0*  PLT 44* 37* 40*    Recent Labs Lab 05/18/17 0739 05/18/17 1954 05/19/17 1010 05/20/17 0352 05/21/17 0458  NA 132* 132* 133* 131* 133*  K 3.4* 4.0 4.0 3.7 3.4*  CL 101 102 108 105 105  CO2 21* 15* 17* 18* 19*  BUN 18 18 16 17 19   CREATININE 1.31* 1.33* 1.14 1.33* 1.28*  CALCIUM 7.5* 7.8* 7.3* 7.5* 7.6*  PROT 6.1* 6.6 5.9*  --   --   BILITOT 5.2* 6.5* 5.3*  --   --   ALKPHOS 126 144* 121  --   --   ALT 22 24 22   --   --   AST 91* 116* 104*  --   --   GLUCOSE 113* 111* 99 122* 119*   INR 1.51  Imaging/Diagnostic Tests: US Abdomen Complete  Result Date: 05/18/2017 CLINICAL DATA:  The cirrhosis. History of hypertension, chronic kidney disease, and prostate cancer. EXAM: ABDOMEN ULTRASOUND COMPLETE COMPARISON:  09/10/2016 FINDINGS: Gallbladder: Gallbladder wall is thickened, 4.3 mm. Layering sludge and tiny stones are present. Stones are too small to measure. No pericholecystic fluid or sonographic Murphy's. Common bile duct: Diameter: 6.1 mm Liver: Nodular surface of the liver. Liver is heterogeneous and echogenic. No focal liver lesions are identified. IVC: Partially obscured by bowel gas. Pancreas: Visualized portion unremarkable. Spleen: The spleen is enlarged. Accessory spleen noted measuring 1.8 cm. Right Kidney: Length: 9.6 cm. Echogenicity within normal limits. No mass or hydronephrosis visualized. Left Kidney: Length: 10.1 cm. Echogenicity within normal limits. No mass or hydronephrosis visualized. Abdominal aorta: No aneurysm visualized. Other findings: Ascites IMPRESSION: 1. Cirrhotic morphology of the liver. 2. No focal liver lesions are identified. 3. Splenomegaly consistent with portal venous hypertension. 4. Ascites. 5. Sludge and stones within the gallbladder. Gallbladder wall thickening is nonspecific in the setting of cirrhosis. Electronically Signed    By: Nolon Nations M.D.   On: 05/18/2017 16:21   Dg Chest Port 1 View  Result Date: 05/17/2017 CLINICAL DATA:  Lactic acidosis. Shortness of breath on exertion. History of hypertension. EXAM: PORTABLE CHEST 1 VIEW COMPARISON:  09/09/2016 FINDINGS: The heart size and mediastinal contours are within normal limits. Both lungs are clear. The visualized skeletal structures are unremarkable. IMPRESSION: No active disease. Electronically Signed   By: Lucienne Capers M.D.   On: 05/17/2017 21:21   Ct Bone Marrow Biopsy  Result Date: 05/19/2017 INDICATION: 56 year old male with pancytopenia EXAM: CT BIOPSY BONE MARROW MEDICATIONS: None. ANESTHESIA/SEDATION: Moderate (conscious) sedation was employed during this procedure. A total of Versed 2.0 mg and Fentanyl 75 mcg was administered intravenously. Moderate Sedation Time: 13 minutes. The patient's level of consciousness and vital signs were monitored continuously by radiology nursing throughout the procedure under my direct supervision. FLUOROSCOPY TIME:  CT COMPLICATIONS: None PROCEDURE: The procedure risks, benefits, and alternatives were explained to the patient. Questions regarding the procedure were encouraged and answered. The patient understands and consents to the procedure. Scout CT of the pelvis was performed for surgical planning purposes. The posterior pelvis was prepped with Betadinein a sterile fashion, and a sterile drape was applied covering the operative field. A sterile gown and sterile gloves were used for the  procedure. Local anesthesia was provided with 1% Lidocaine. We targeted the right posterior iliac bone for biopsy. The skin and subcutaneous tissues were infiltrated with 1% lidocaine without epinephrine. A small stab incision was made with an 11 blade scalpel, and an 11 gauge Murphy needle was advanced with CT guidance to the posterior cortex. Manual forced was used to advance the needle through the posterior cortex and the stylet was  removed. A bone marrow aspirate was retrieved and passed to a cytotechnologist in the room. The Murphy needle was then advanced without the stylet for a core biopsy. The core biopsy was retrieved and also passed to a cytotechnologist. Manual pressure was used for hemostasis and a sterile dressing was placed. No complications were encountered no significant blood loss was encountered. Patient tolerated the procedure well and remained hemodynamically stable throughout. IMPRESSION: Status post CT-guided bone marrow biopsy, with tissue specimen sent to pathology for complete histopathologic analysis Signed, Dulcy Fanny. Earleen Newport, DO [facility] Radiology Electronically Signed   By: Corrie Mckusick D.O.   On: 05/19/2017 10:57    Tonette Bihari, MD 05/21/2017, 8:47 AM PGY-1, [place] Intern pager: [phone], text pages welcome

## 2017-05-22 DIAGNOSIS — D538 Other specified nutritional anemias: Secondary | ICD-10-CM

## 2017-05-22 LAB — MAGNESIUM: MAGNESIUM: 2 mg/dL (ref 1.7–2.4)

## 2017-05-22 LAB — CBC
HCT: 27.2 % — ABNORMAL LOW (ref 39.0–52.0)
HEMOGLOBIN: 9.4 g/dL — AB (ref 13.0–17.0)
MCH: 29.9 pg (ref 26.0–34.0)
MCHC: 34.6 g/dL (ref 30.0–36.0)
MCV: 86.6 fL (ref 78.0–100.0)
PLATELETS: 62 10*3/uL — AB (ref 150–400)
RBC: 3.14 MIL/uL — AB (ref 4.22–5.81)
RDW: 19.7 % — ABNORMAL HIGH (ref 11.5–15.5)
WBC: 2.8 10*3/uL — AB (ref 4.0–10.5)

## 2017-05-22 LAB — TYPE AND SCREEN
ABO/RH(D): B POS
Antibody Screen: NEGATIVE
UNIT DIVISION: 0
Unit division: 0

## 2017-05-22 LAB — COMPREHENSIVE METABOLIC PANEL
ALBUMIN: 2.3 g/dL — AB (ref 3.5–5.0)
ALT: 30 U/L (ref 17–63)
ANION GAP: 9 (ref 5–15)
AST: 109 U/L — ABNORMAL HIGH (ref 15–41)
Alkaline Phosphatase: 154 U/L — ABNORMAL HIGH (ref 38–126)
BUN: 21 mg/dL — ABNORMAL HIGH (ref 6–20)
CALCIUM: 8.5 mg/dL — AB (ref 8.9–10.3)
CHLORIDE: 105 mmol/L (ref 101–111)
CO2: 19 mmol/L — AB (ref 22–32)
Creatinine, Ser: 1.32 mg/dL — ABNORMAL HIGH (ref 0.61–1.24)
GFR calc non Af Amer: 58 mL/min — ABNORMAL LOW (ref >60)
Glucose, Bld: 98 mg/dL (ref 65–99)
POTASSIUM: 3.8 mmol/L (ref 3.5–5.1)
SODIUM: 133 mmol/L — AB (ref 135–145)
Total Bilirubin: 7.6 mg/dL — ABNORMAL HIGH (ref 0.3–1.2)
Total Protein: 6.2 g/dL — ABNORMAL LOW (ref 6.5–8.1)

## 2017-05-22 LAB — BPAM RBC
Blood Product Expiration Date: 201806142359
Blood Product Expiration Date: 201806142359
ISSUE DATE / TIME: 201805271121
ISSUE DATE / TIME: 201805271455
Unit Type and Rh: 7300
Unit Type and Rh: 7300

## 2017-05-22 LAB — PHOSPHORUS: Phosphorus: 2.8 mg/dL (ref 2.5–4.6)

## 2017-05-22 LAB — HSV CULTURE AND TYPING

## 2017-05-22 LAB — PROTIME-INR
INR: 1.27
PROTHROMBIN TIME: 16 s — AB (ref 11.4–15.2)

## 2017-05-22 MED ORDER — SPIRONOLACTONE 50 MG PO TABS: 50.0000 mg | ORAL_TABLET | Freq: Every day | ORAL | 0 refills | Status: DC

## 2017-05-22 MED ORDER — FUROSEMIDE 20 MG PO TABS: 20.0000 mg | ORAL_TABLET | Freq: Every day | ORAL | 0 refills | Status: DC

## 2017-05-22 NOTE — Progress Notes (Signed)
Physical Therapy Treatment Patient Details Name: EDUARD PENKALA MRN: [id number] DOB: February 08, 1961 Today's Date: 05/22/2017    History of Present Illness 56 yo M with hx of alcoholic cirrhosis with esophageal varices, CKD3, alcohol abuse, polyclonal gammopathy, pancytopenia, and alleged prostate cancer, presenting with symptomatic anemia/pancytopenia with an initial hgb of 5.4.    PT Comments    Patient progressing well towards PT goals. Improved ambulation distance with use of SPC today which pt reports he uses PRN at baseline. Reports having difficulty negotiating stairs PTA. Will plan for stair training and higher level balance challenges next session as tolerated. Will follow.   Follow Up Recommendations  No PT follow up     Equipment Recommendations  None recommended by PT    Recommendations for Other Services       Precautions / Restrictions Precautions Precautions: None Restrictions Weight Bearing Restrictions: No    Mobility  Bed Mobility               General bed mobility comments: Up in chair upon PT arrival.  Transfers Overall transfer level: Needs assistance Equipment used: None Transfers: Sit to/from Stand Sit to Stand: Supervision         General transfer comment: Supervision for safety. Stood from Youth worker.   Ambulation/Gait Ambulation/Gait assistance: Supervision Ambulation Distance (Feet): 250 Feet Assistive device: Straight cane Gait Pattern/deviations: Step-through pattern;Decreased stride length Gait velocity: decreased Gait velocity interpretation: Below normal speed for age/gender General Gait Details: Slow, steady gait using SPC. HR stable in 90s. SLow to turn but no LOB.   Stairs            Wheelchair Mobility    Modified Rankin (Stroke Patients Only)       Balance Overall balance assessment: Needs assistance Sitting-balance support: Feet supported;No upper extremity supported Sitting balance-Leahy Scale: Good      Standing balance support: During functional activity Standing balance-Leahy Scale: Fair Standing balance comment: Able to stand statically without UE support but feels more comfortable with 1 UE support for dynamic standing.                            Cognition Arousal/Alertness: Awake/alert Behavior During Therapy: WFL for tasks assessed/performed Overall Cognitive Status: Within Functional Limits for tasks assessed                                        Exercises      General Comments        Pertinent Vitals/Pain Pain Assessment: No/denies pain    Home Living                      Prior Function            PT Goals (current goals can now be found in the care plan section) Progress towards PT goals: Progressing toward goals    Frequency    Min 3X/week      PT Plan Current plan remains appropriate    Co-evaluation              AM-PAC PT "6 Clicks" Daily Activity  Outcome Measure  Difficulty turning over in bed (including adjusting bedclothes, sheets and blankets)?: None Difficulty moving from lying on back to sitting on the side of the bed? : None Difficulty sitting down on and standing up from a  chair with arms (e.g., wheelchair, bedside commode, etc,.)?: None Help needed moving to and from a bed to chair (including a wheelchair)?: None Help needed walking in hospital room?: A Little Help needed climbing 3-5 steps with a railing? : A Little 6 Click Score: 22    End of Session Equipment Utilized During Treatment: Gait belt Activity Tolerance: Patient tolerated treatment well Patient left: in chair;with call bell/phone within reach Nurse Communication: Mobility status PT Visit Diagnosis: Unsteadiness on feet (R26.81)     Time: 9150-4136 PT Time Calculation (min) (ACUTE ONLY): 20 min  Charges:  $Gait Training: 8-22 mins                    G Codes:       Wray Kearns, [place], DPT [place] 05/22/2017, 10:32 AM

## 2017-05-22 NOTE — Care Management Note (Signed)
Case Management Note  Patient Details  Name: TINY RIETZ MRN: [id number] Date of Birth: [birth year]  Subjective/Objective:                    Action/Plan: Pt discharging home with self care. Pt has insurance, PCP and transportation home. No further needs per CM.   Expected Discharge Date:  05/22/17               Expected Discharge Plan:  Home/Self Care  In-House Referral:     Discharge planning Services  CM Consult  Post Acute Care Choice:    Choice offered to:     DME Arranged:    DME Agency:     HH Arranged:    HH Agency:     Status of Service:  Completed, signed off  If discussed at [employer] of Stay Meetings, dates discussed:    Additional Comments:  Pollie Friar, RN 05/22/2017, 3:17 PM

## 2017-05-22 NOTE — Discharge Summary (Signed)
[facility] Discharge Summary  Patient name: William [name] Medical record number: [id number] Date of birth: 12-12-1961 Age: 56 y.o. Gender: male Date of Admission: 05/17/2017  Date of Discharge: 05/22/17 Admitting Physician: Leeanne Rio, MD  Primary Care Provider: Vivi Barrack, MD Consultants: hematology, IR, GI  Indication for Hospitalization: symptomatic anemia and pancytopenia  Discharge Diagnoses/Problem List:  Fatigue, symptomatic anemia CKD3 Cough Chronic hyponatremia Hypophosphatemia Hypomagnesemia Alcoholic cirrhosis with esophageal varices Tongue ulceration  Disposition: home  Discharge Condition: stable   Discharge Exam: see progress note from day of discharge  Brief Hospital Course:  Patient was admitted with symptomatic anemia in the setting of known pancytopenia for which he followed with hematology. He was noted to be pale, fatigued, and short of breath on admission with Hgb POCT in [facility] clinic of 5.7. He is also an alcoholic and had poor PO intake for 2 days prior to admission, likely causing a dehydration lactic acidosis and AKI. Repeat CBC in ED with Hgb 6.6. Patient transfused 2U PRBCs. WBC 2.7 and platelets 75. Folate low at 2.7, B12 404, Iron 159, TIBC low 195, Saturation Ratios high 82%, Ferritin high 1557, Retics inappropriately normal at 1.1%, LDH 157. Similar admission 08/2016 where extensive workup for pancytopenia was conducted (only lack bone marrow biopsy). Heme felt pancytopenia likely secondary to liver cirrhosis from alcohol and splenic sequestration. Prostate cancer was noted in his chart, however patient denies this ever being diagnosed. This was removed from his problem list. Additionally, on admission patient was found to have hyperpigmented macules over his tongue. He later noted some ulcerations over lateral tongue. RPR negative. Tongue ulcer was swabbed for HSV, which was found to be negative. Heme was  consulted, who recommended transfusion and bone marrow biopsy. GI was consulted given MELD score of 24: recommended alcohol abstinence, no endoscopic intervention, liver US. Complete abdominal US with cirrhotic morphology, no focal liver lesions, splenomegaly c/w portal venous HTN, sludge and stones in gallbladder with nonspecific thickening. Hgb dropped to 6.8 on 5/27, transfused 2U PRBCs. Hgb stable for 24 hours prior to discharge.   Issues for Follow Up:  1. Lasix and spironolactone were initially held due to dehydration, however restarted on 5/27, f/u BMET.  2. If patient's renal function worsens or BP cannot tolerate it, consider discontinuing propanolol (variceal bleed prophylaxis) per GI. 3. He needs to follow up in GI clinic in 2-4 weeks after discharge (closed on day of discharge so could not be scheduled). 4. Follow up on bone marrow biopsy. 5. Patient with ulcerations and burning of tongue. HSV negative, please refer to dermatology for possible malignant workup.  6. Patient complained of dry cough x several months without associated symptoms; CXR clear or hypoxia, follow this up. 7. Follow up hemoglobin and bilirubin.   Significant Procedures: bone marrow biopsy  Significant Labs and Imaging:   Recent Labs Lab 05/20/17 0352 05/21/17 0458 05/21/17 2135 05/22/17 0550  WBC 1.9* 1.6*  --  2.8*  HGB 7.0* 6.8* 9.2* 9.4*  HCT 20.5* 20.0* 26.7* 27.2*  PLT 37* 40*  --  62*    Recent Labs Lab 05/17/17 1910 05/18/17 0739 05/18/17 1954 05/19/17 0618 05/19/17 1010 05/20/17 0352 05/21/17 0458 05/21/17 0855 05/22/17 0550  NA  --  132* 132*  --  133* 131* 133*  --  133*  K  --  3.4* 4.0  --  4.0 3.7 3.4*  --  3.8  CL  --  101 102  --  108  105 105  --  105  CO2  --  21* 15*  --  17* 18* 19*  --  19*  GLUCOSE  --  113* 111*  --  99 122* 119*  --  98  BUN  --  18 18  --  _0 --  21*  CREATININE  --  1.31* 1.33*  --  1.14 1.33* 1.28*  --  1.32*  CALCIUM  --  7.5* 7.8*  --   7.3* 7.5* 7.6*  --  8.5*  MG 1.0* 1.4*  --  1.5*  --  1.7  --   --  2.0  PHOS  --  <1.0*  --  <1.0*  --  1.3* 2.5  --  2.8  ALKPHOS  --  126 144*  --  121  --   --  143* 154*  AST  --  91* 116*  --  104*  --   --  106* 109*  ALT  --  22 24  --  22  --   --  24 30  ALBUMIN  --  2.4* 2.7*  --  2.3*  --  2.0* 2.1* 2.3*    Mg persistently <2 until day of discharge - 2.0.   Results/Tests Pending at Time of Discharge: bone marrow biopsy results  Discharge Medications:  Allergies as of 05/22/2017   No Known Allergies     Medication List    STOP taking these medications   baclofen 10 MG tablet Commonly known as:  LIORESAL   diclofenac sodium 1 % Gel Commonly known as:  VOLTAREN   traMADol 50 MG tablet Commonly known as:  ULTRAM   Vitamin D (Ergocalciferol) 50000 units Caps capsule Commonly known as:  DRISDOL     TAKE these medications   allopurinol 100 MG tablet Commonly known as:  ZYLOPRIM TAKE ONE TABLET BY MOUTH ONCE DAILY   colchicine 0.6 MG tablet Commonly known as:  COLCRYS Take 0.5 tablets (0.3 mg total) by mouth daily.   folic acid 1 MG tablet Commonly known as:  FOLVITE TAKE ONE TABLET BY MOUTH DAILY   furosemide 20 MG tablet Commonly known as:  LASIX Take 1 tablet (20 mg total) by mouth daily.   gabapentin 100 MG capsule Commonly known as:  NEURONTIN Take 1 capsule (100 mg total) by mouth 3 (three) times daily.   IRON PO Take 1 tablet by mouth daily.   omeprazole 40 MG capsule Commonly known as:  PRILOSEC TAKE ONE CAPSULE BY MOUTH ONCE DAILY   propranolol 40 MG tablet Commonly known as:  INDERAL TAKE ONE TABLET BY MOUTH TWICE DAILY   spironolactone 50 MG tablet Commonly known as:  ALDACTONE Take 1 tablet (50 mg total) by mouth daily. What changed:  medication strength  See the new instructions.   thiamine 100 MG tablet Commonly known as:  VITAMIN B-1 Take 100 mg by mouth daily.       Discharge Instructions: Please refer to Patient  Instructions section of EMR for full details.  Patient was counseled important signs and symptoms that should prompt return to medical care, changes in medications, dietary instructions, activity restrictions, and follow up appointments.   Follow-Up Appointments: Follow-up Information    Otis Brace, MD. Call.   Specialty:  Gastroenterology Why:  to make an appointment with the GI doctor for 2-4 weeks Contact information: [place] [address] [phone]        Junie Panning  N, DO Follow up on 05/25/2017.   Specialty:  Family Medicine Why:  at 2:45pm for a hospital follow up- show up at 2:30 Contact information: [address] [place] [address] [phone]           Sela Hilding, MD 05/23/2017, 7:15 AM PGY-1, [place]

## 2017-05-22 NOTE — Progress Notes (Signed)
[facility] Gastroenterology Progress Note  William [name] 56 y.o. [birth year]  CC:   Cirrhosis, jaundice, pancytopenia   Subjective: No acute issues overnight. . Tolerating diet. Denied nausea or vomiting. Hemoglobin stable.  ROS : Negative for chest pain or shortness of breath. Negative for fever   Objective: Vital signs in last 24 hours: Vitals:   05/22/17 0026 05/22/17 0503  BP: 108/71 98/66  Pulse: 77 72  Resp:  18  Temp:  98.4 F (36.9 C)    Physical Exam:  Constitutional: He is oriented to person, place, and time. He appears well-developed and well-nourished. No distress.  HENT:  Head: Normocephalic and atraumatic.  Mouth/Throat: Oropharynx is clear and moist. No oropharyngeal exudate.  Eyes: Conjunctivae and EOM are normal. Scleral icterus is present.  Neck: Normal range of motion. Neck supple. No thyromegaly present.  Cardiovascular: Normal rate, regular rhythm and normal heart sounds.   No murmur heard. Pulmonary/Chest: Effort normal and breath sounds normal. No respiratory distress.  Abdominal: Soft. Bowel sounds are normal. Mild distension. There is no tenderness. There is no rebound and no guarding.  Musculoskeletal: He exhibits no edema or tenderness.  Neurological: He is alert and oriented to person, place, and time.  Skin: Skin is warm. No erythema.  Psychiatric: He has a normal mood and affect. His behavior is normal. Thought content normal    Lab Results:  Recent Labs  05/20/17 0352 05/21/17 0458 05/22/17 0550  NA 131* 133* 133*  K 3.7 3.4* 3.8  CL 105 105 105  CO2 18* 19* 19*  GLUCOSE 122* 119* 98  BUN 17 19 21*  CREATININE 1.33* 1.28* 1.32*  CALCIUM 7.5* 7.6* 8.5*  MG 1.7  --  2.0  PHOS 1.3* 2.5 2.8    Recent Labs  05/21/17 0855 05/22/17 0550  AST 106* 109*  ALT 24 30  ALKPHOS 143* 154*  BILITOT 4.6* 7.6*  PROT 5.9* 6.2*  ALBUMIN 2.1* 2.3*    Recent Labs  05/21/17 0458 05/21/17 2135 05/22/17 0550  WBC 1.6*  --  2.8*  HGB 6.8*  9.2* 9.4*  HCT 20.0* 26.7* 27.2*  MCV 89.3  --  86.6  PLT 40*  --  62*    Recent Labs  05/21/17 0458 05/22/17 0550  LABPROT 18.5* 16.0*  INR 1.53 1.27      Assessment/Plan: - Normocytic anemia with pancytopenia. No overt bleeding. Occult blood negative Iron studies shows anemia of chronic disease. - Alcoholic cirrhosis. MELD score 22 as of today  Patient with ongoing alcohol use Prior to admission - History of small esophageal varices -   Last EGD in September 2017. On propranolol - Pancytopenia, probably from underlying liver disease and ongoing alcohol use - Fever - resolved - Mild ascites. - Mild kidney insufficiency.  Recommendations ------------------------ - Hemoglobin relatively stable . Occult blood negative. No evidence of overt bleeding. - No plan for endoscopic intervention at this point in absence of overt bleeding.   - He is status post bone marrow biopsy - report pending. - Started on low-dose diuretics yesterday. Monitor kidney function. Consider discontinuing diuretics if worsening of kidney functions noted. - Patient on Propranolol for secondary prevention of variceal bleeding. Consider DC propranolol if ongoing worsening of kidney function as well as any evidence of hypotension. - Absolute alcohol abstinence advised. - Monitor LFTs and CBC. Transfuse to keep hemoglobin around 7. Avoid over transfusion. - GI will sign off. Call us back if needed. If he gets discharged, he needs to have repeat  BMP check within one week. Follow-up in GI clinic in 2-4 weeks after discharge    Otis Brace MD, [place] 05/22/2017, 8:16 AM  Pager [phone]  If no answer or after 5 PM call [phone]

## 2017-05-22 NOTE — Discharge Instructions (Signed)
You were admitted for symptomatic anemia.  You were transfused with blood.    Continue Lasix 20mg , we increased the spironolactone to 50mg  daily.  Please make sure to take your medications as recommended and follow up with Dr Gerlean Ren in office as scheduled on Thursday.  At that time maybe the bone biopsy results and the results of the tongue swab will return, however it may take longer to come back. If you note dizziness, worsening fatigue, shortness of breath, or chest pain seek care immediately    Anemia, Nonspecific Anemia is a condition in which the concentration of red blood cells or hemoglobin in the blood is below normal. Hemoglobin is a substance in red blood cells that carries oxygen to the tissues of the body. Anemia results in not enough oxygen reaching these tissues. What are the causes? Common causes of anemia include:  Excessive bleeding. Bleeding may be internal or external. This includes excessive bleeding from periods (in women) or from the intestine.  Poor nutrition.  Chronic kidney, thyroid, and liver disease.  Bone marrow disorders that decrease red blood cell production.  Cancer and treatments for cancer.  HIV, AIDS, and their treatments.  Spleen problems that increase red blood cell destruction.  Blood disorders.  Excess destruction of red blood cells due to infection, medicines, and autoimmune disorders. What are the signs or symptoms?  Minor weakness.  Dizziness.  Headache.  Palpitations.  Shortness of breath, especially with exercise.  Paleness.  Cold sensitivity.  Indigestion.  Nausea.  Difficulty sleeping.  Difficulty concentrating. Symptoms may occur suddenly or they may develop slowly. How is this diagnosed? Additional blood tests are often needed. These help your health care provider determine the best treatment. Your health care provider will check your stool for blood and look for other causes of blood loss. How is this  treated? Treatment varies depending on the cause of the anemia. Treatment can include:  Supplements of iron, vitamin H21, or folic acid.  Hormone medicines.  A blood transfusion. This may be needed if blood loss is severe.  Hospitalization. This may be needed if there is significant continual blood loss.  Dietary changes.  Spleen removal. Follow these instructions at home: Keep all follow-up appointments. It often takes many weeks to correct anemia, and having your health care provider check on your condition and your response to treatment is very important. Get help right away if:  You develop extreme weakness, shortness of breath, or chest pain.  You become dizzy or have trouble concentrating.  You develop heavy vaginal bleeding.  You develop a rash.  You have bloody or black, tarry stools.  You faint.  You vomit up blood.  You vomit repeatedly.  You have abdominal pain.  You have a fever or persistent symptoms for more than 2-3 days.  You have a fever and your symptoms suddenly get worse.  You are dehydrated. This information is not intended to replace advice given to you by your health care provider. Make sure you discuss any questions you have with your health care provider. Document Released: 01/19/2005 Document Revised: 05/25/2016 Document Reviewed: 06/07/2013 Elsevier Interactive Patient Education  2017 [employer].

## 2017-05-22 NOTE — Progress Notes (Signed)
Family Medicine Teaching Service Daily Progress Note Intern Pager: [phone]  Patient name: William [name] Medical record number: [id number] Date of birth: 06-21-1961 Age: 56 y.o. Gender: male  Primary Care Provider: Vivi Barrack, MD Consultants: hematology Code Status: FULL   Pt Overview and Major Events to Date:  5/23 admitted with symptomatic anemia 5/23 received 3 units of pRBCs  5/24 GI and Heme consulted  5/25 S/p Bone Marrow Biopsy  5/27 Hgb 6.8, transfused 2 units pRBCs, Hg 9.2 5/28: Hg 9.4  Assessment and Plan: William [name] is a 56 y.o. male presenting with fatigue. PMH is significant for chronic anemia, alcoholic cirrhosis, esophageal varices, polyclonal elevation of gamma globulins, HTN, CKD3.   Fatigue/symptomatic anemia, pancytopenia: Known history of anemia/ pancytopenia, has seen hematology most recently this fall. Etiology thought to be secondary to cirrhosis of the liver causing splenomegaly and subsequent sequestrations. HIV negative in 08/2016. S/p 5 units PRBC. Folate low, replete, haptoglobin low at <10, parvovirus negative, FOBT negative  -Bone marrow biopsy results pending  -per GI transfuse to keep hemoglobin around 7, avoid over transfusion, also consider d/c of propanolol if ongoing worsening ofr renal function and/or hypotension - continue following CBC   CKD3:  Scr 1.32 stable throughout admission. Baseline Cr 1- 1.3. Initial AKI now resolved likely secondary to dehydration due to vomiting and poor PO intake with concomitant alcoholism. - continue to follow serum creatinine    Cough: patient states it is dry, has been present x 1 month. No fevers or chills.  Stated he did not have a CXR recently, however CXR on admission with no acute abnormalities. - could consider screening for TB?   Chronic hyponatremia: Baseline Na 130-134.  -daily CMP  Hypophosphatemia, resolved: likely 2/2 malnutrition due to alcoholism. S/p 60mol IV 5/24, 5/25, 5/26    -  consider monitoring intermittently  Hypomagnesemia, resolved: likely 2/2 malnutrition due to alcoholism. S/p 5 days of repletion - monitor intermittently  Alcoholic cirrhosis with esophageal varices: chronic elevated LFTs consistent with alcoholic cirrhosis. INR elevated to 1.53.  Home propranolol for prophylaxis of variceal hemorrhage. Currently being held. MELD score 27. Cirrhotic liver on [place] focal lesions, splenomegaly also present.  - started back spironolactone and Lasix.  -Continue propranolol at home dose as long as renal function doesn't worsen.  -No longer on CIWA given scores of 0 during this hospitalization.  -Folic acid, MVI, Thiamine -GI following, appreciate recs  History of prostate cancer: on problem list. No further details in our chart.  PSA in 08/2016: 0.37. Patient denies this ever being a problem for him.  -PCP to follow up as an outpatient   Tongue ulceration: complains of burning on tongue - ulceration on right side of tongue-  could be HSV vs. Candida. Other serious concerns include squamous cell carcinoma.    Also note hyperpigmented patches of tongue, he cannot recall duration of this or previously diagnoses. Denies ever smoking. B12 normal.  - HSV swab of tongue ulcer pending   -consider outpatient derm for possible biopsy/evaluation - choloraseptic gargle spray   FEN/GI: MIVF, Normal diet Prophylaxis: SCDs in setting of anemia and thrombocytopenia  Disposition: continued inpatient management of anemia.   Subjective:   Doing well this morning, bothered by cough. States it is dry. No SOB, fevers, or chills. Present x 1 month without change.   Objective: Temp:  [98 F (36.7 C)-99.3 F (37.4 C)] 98.4 F (36.9 C) (05/28 0503) Pulse Rate:  [72-81] 72 (05/28 0503) Resp:  [  18] 18 (05/28 0503) BP: (90-117)/(57-76) 98/66 (05/28 0503) SpO2:  [100 %] 100 % (05/28 0503) Physical Exam: General: Pale, well appearing male sitting up in bed in NAD.   Cardiovascular: RRR, no murmur Respiratory: CTAB, easy WOB  Abdomen: SNTND, no organomegaly, +BS Extremities: no edema, warm, SCDs in place  Laboratory:  Recent Labs Lab 05/20/17 0352 05/21/17 0458 05/21/17 2135 05/22/17 0550  WBC 1.9* 1.6*  --  2.8*  HGB 7.0* 6.8* 9.2* 9.4*  HCT 20.5* 20.0* 26.7* 27.2*  PLT 37* 40*  --  62*    Recent Labs Lab 05/19/17 1010 05/20/17 0352 05/21/17 0458 05/21/17 0855 05/22/17 0550  NA 133* 131* 133*  --  133*  K 4.0 3.7 3.4*  --  3.8  CL 108 105 105  --  105  CO2 17* 18* 19*  --  19*  BUN 16 17 19   --  21*  CREATININE 1.14 1.33* 1.28*  --  1.32*  CALCIUM 7.3* 7.5* 7.6*  --  8.5*  PROT 5.9*  --   --  5.9* 6.2*  BILITOT 5.3*  --   --  4.6* 7.6*  ALKPHOS 121  --   --  143* 154*  ALT 22  --   --  24 30  AST 104*  --   --  106* 109*  GLUCOSE 99 122* 119*  --  98   INR 1.27  Imaging/Diagnostic Tests: US Abdomen Complete  Result Date: 05/18/2017 CLINICAL DATA:  The cirrhosis. History of hypertension, chronic kidney disease, and prostate cancer. EXAM: ABDOMEN ULTRASOUND COMPLETE COMPARISON:  09/10/2016 FINDINGS: Gallbladder: Gallbladder wall is thickened, 4.3 mm. Layering sludge and tiny stones are present. Stones are too small to measure. No pericholecystic fluid or sonographic Murphy's. Common bile duct: Diameter: 6.1 mm Liver: Nodular surface of the liver. Liver is heterogeneous and echogenic. No focal liver lesions are identified. IVC: Partially obscured by bowel gas. Pancreas: Visualized portion unremarkable. Spleen: The spleen is enlarged. Accessory spleen noted measuring 1.8 cm. Right Kidney: Length: 9.6 cm. Echogenicity within normal limits. No mass or hydronephrosis visualized. Left Kidney: Length: 10.1 cm. Echogenicity within normal limits. No mass or hydronephrosis visualized. Abdominal aorta: No aneurysm visualized. Other findings: Ascites IMPRESSION: 1. Cirrhotic morphology of the liver. 2. No focal liver lesions are identified. 3.  Splenomegaly consistent with portal venous hypertension. 4. Ascites. 5. Sludge and stones within the gallbladder. Gallbladder wall thickening is nonspecific in the setting of cirrhosis. Electronically Signed   By: Nolon Nations M.D.   On: 05/18/2017 16:21   Dg Chest Port 1 View  Result Date: 05/17/2017 CLINICAL DATA:  Lactic acidosis. Shortness of breath on exertion. History of hypertension. EXAM: PORTABLE CHEST 1 VIEW COMPARISON:  09/09/2016 FINDINGS: The heart size and mediastinal contours are within normal limits. Both lungs are clear. The visualized skeletal structures are unremarkable. IMPRESSION: No active disease. Electronically Signed   By: Lucienne Capers M.D.   On: 05/17/2017 21:21   Ct Bone Marrow Biopsy  Result Date: 05/19/2017 INDICATION: 56 year old male with pancytopenia EXAM: CT BIOPSY BONE MARROW MEDICATIONS: None. ANESTHESIA/SEDATION: Moderate (conscious) sedation was employed during this procedure. A total of Versed 2.0 mg and Fentanyl 75 mcg was administered intravenously. Moderate Sedation Time: 13 minutes. The patient's level of consciousness and vital signs were monitored continuously by radiology nursing throughout the procedure under my direct supervision. FLUOROSCOPY TIME:  CT COMPLICATIONS: None PROCEDURE: The procedure risks, benefits, and alternatives were explained to the patient. Questions regarding the procedure were encouraged  and answered. The patient understands and consents to the procedure. Scout CT of the pelvis was performed for surgical planning purposes. The posterior pelvis was prepped with Betadinein a sterile fashion, and a sterile drape was applied covering the operative field. A sterile gown and sterile gloves were used for the procedure. Local anesthesia was provided with 1% Lidocaine. We targeted the right posterior iliac bone for biopsy. The skin and subcutaneous tissues were infiltrated with 1% lidocaine without epinephrine. A small stab incision was made  with an 11 blade scalpel, and an 11 gauge Murphy needle was advanced with CT guidance to the posterior cortex. Manual forced was used to advance the needle through the posterior cortex and the stylet was removed. A bone marrow aspirate was retrieved and passed to a cytotechnologist in the room. The Murphy needle was then advanced without the stylet for a core biopsy. The core biopsy was retrieved and also passed to a cytotechnologist. Manual pressure was used for hemostasis and a sterile dressing was placed. No complications were encountered no significant blood loss was encountered. Patient tolerated the procedure well and remained hemodynamically stable throughout. IMPRESSION: Status post CT-guided bone marrow biopsy, with tissue specimen sent to pathology for complete histopathologic analysis Signed, Dulcy Fanny. Earleen Newport, DO [facility] Electronically Signed   By: Corrie Mckusick D.O.   On: 05/19/2017 10:57    Archie Patten, MD 05/22/2017, 9:19 AM PGY-3, [place] [facility] pager: [phone], text pages welcome

## 2017-05-23 ENCOUNTER — Other Ambulatory Visit: Payer: Self-pay | Admitting: Family Medicine

## 2017-05-23 ENCOUNTER — Inpatient Hospital Stay: Payer: Medicare PPO | Admitting: Family Medicine

## 2017-05-23 MED ORDER — COLCHICINE 0.6 MG PO TABS: 0.3000 mg | ORAL_TABLET | Freq: Every day | ORAL | 5 refills | Status: DC

## 2017-05-23 MED ORDER — GABAPENTIN 100 MG PO CAPS: 100.0000 mg | ORAL_CAPSULE | Freq: Three times a day (TID) | ORAL | 5 refills | Status: DC

## 2017-05-23 MED ORDER — SPIRONOLACTONE 50 MG PO TABS: 50.0000 mg | ORAL_TABLET | Freq: Every day | ORAL | 5 refills | Status: DC

## 2017-05-23 NOTE — Telephone Encounter (Signed)
Pt needs refills on gabapentin and colchicine. Pt uses [facility] on [address]. Brother states pharmacy did not have the spironolactone 50 mg, and he would like to have that called in as well. ep

## 2017-05-24 LAB — HAPTOGLOBIN

## 2017-05-25 ENCOUNTER — Encounter: Payer: Self-pay | Admitting: Family Medicine

## 2017-05-25 ENCOUNTER — Ambulatory Visit (INDEPENDENT_AMBULATORY_CARE_PROVIDER_SITE_OTHER): Payer: Medicare PPO | Admitting: Family Medicine

## 2017-05-25 VITALS — BP 98/70 | HR 110 | Temp 98.7°F | Ht 71.0 in | Wt 181.6 lb

## 2017-05-25 DIAGNOSIS — K7030 Alcoholic cirrhosis of liver without ascites: Secondary | ICD-10-CM | POA: Diagnosis not present

## 2017-05-25 DIAGNOSIS — K1370 Unspecified lesions of oral mucosa: Secondary | ICD-10-CM

## 2017-05-25 DIAGNOSIS — D638 Anemia in other chronic diseases classified elsewhere: Secondary | ICD-10-CM | POA: Diagnosis not present

## 2017-05-25 LAB — POCT HEMOGLOBIN: HEMOGLOBIN: 8.9 g/dL — AB (ref 14.1–18.1)

## 2017-05-25 MED ORDER — COLCHICINE 0.6 MG PO TABS: 0.3000 mg | ORAL_TABLET | Freq: Every day | ORAL | 5 refills | Status: DC

## 2017-05-25 MED ORDER — GABAPENTIN 100 MG PO CAPS: 100.0000 mg | ORAL_CAPSULE | Freq: Three times a day (TID) | ORAL | 5 refills | Status: DC

## 2017-05-25 NOTE — Patient Instructions (Addendum)
Thank you so much for coming to visit today! We will check several labs today. I will contact you with the results. I have placed a referral to GI and ENT. Please let me know if you do not hear from them soon. Please follow up with your Hematologist. Please let us know immediately if the peeling on your hands spreads or if you develop peeing in other areas. For now, use Vaseline and limit how often you are washing your hands. Please return in one week or sooner if needed.  Dr. Gerlean Ren

## 2017-05-26 ENCOUNTER — Encounter: Payer: Self-pay | Admitting: Physician Assistant

## 2017-05-26 ENCOUNTER — Inpatient Hospital Stay: Payer: Medicare PPO | Admitting: Family Medicine

## 2017-05-26 LAB — CBC
Hematocrit: 27.5 % — ABNORMAL LOW (ref 37.5–51.0)
Hemoglobin: 9.5 g/dL — ABNORMAL LOW (ref 13.0–17.7)
MCH: 30 pg (ref 26.6–33.0)
MCHC: 34.5 g/dL (ref 31.5–35.7)
MCV: 87 fL (ref 79–97)
Platelets: 120 10*3/uL — ABNORMAL LOW (ref 150–379)
RBC: 3.17 x10E6/uL — AB (ref 4.14–5.80)
RDW: 19.8 % — ABNORMAL HIGH (ref 12.3–15.4)
WBC: 4.3 10*3/uL (ref 3.4–10.8)

## 2017-05-26 LAB — CMP14+EGFR
ALBUMIN: 2.9 g/dL — AB (ref 3.5–5.5)
ALK PHOS: 147 IU/L — AB (ref 39–117)
ALT: 28 IU/L (ref 0–44)
AST: 95 IU/L — ABNORMAL HIGH (ref 0–40)
Albumin/Globulin Ratio: 0.8 — ABNORMAL LOW (ref 1.2–2.2)
BILIRUBIN TOTAL: 10.5 mg/dL — AB (ref 0.0–1.2)
BUN / CREAT RATIO: 18 (ref 9–20)
BUN: 29 mg/dL — ABNORMAL HIGH (ref 6–24)
CHLORIDE: 100 mmol/L (ref 96–106)
CO2: 17 mmol/L — ABNORMAL LOW (ref 18–29)
Calcium: 8.7 mg/dL (ref 8.7–10.2)
Creatinine, Ser: 1.59 mg/dL — ABNORMAL HIGH (ref 0.76–1.27)
GFR calc Af Amer: 55 mL/min/{1.73_m2} — ABNORMAL LOW (ref >59)
GFR calc non Af Amer: 47 mL/min/{1.73_m2} — ABNORMAL LOW (ref >59)
GLOBULIN, TOTAL: 3.8 g/dL (ref 1.5–4.5)
Glucose: 112 mg/dL — ABNORMAL HIGH (ref 65–99)
Potassium: 4.2 mmol/L (ref 3.5–5.2)
SODIUM: 132 mmol/L — AB (ref 134–144)
Total Protein: 6.7 g/dL (ref 6.0–8.5)

## 2017-05-26 LAB — TSH: TSH: 5.54 u[IU]/mL — ABNORMAL HIGH (ref 0.450–4.500)

## 2017-05-26 LAB — BRAIN NATRIURETIC PEPTIDE: BNP: 398.4 pg/mL — AB (ref 0.0–100.0)

## 2017-05-27 NOTE — Progress Notes (Signed)
Subjective:     Patient ID: William [name], male   DOB: [birth year], 56 y.o.   MRN: [id number]  HPI Mr. Earnhart is a 56yo male presenting today for hospital follow up. Hospitalized from 5/23-5/28 for symptomatic anemia (pale, fatigued, short of breath). Hemoglobin 6.6 at admission. Received 4 units of blood during hospitalization, two units on 5/23 and two units on 5/27. Hemoglobin 9.4 at discharge. Bone marrow biopsy obtained and showed hypercellular bone marrow with erythroid hyperplasia and dyspoietic changes; follow up flow cytometry showed no blastic populations. Today, reports he is still weak from discharge, but denies the shortness of breath and dizziness he noted at admission. Continues to deny bleeding, including dark stools or rectal bleeding. Has not yet followed up with GI and reports he needs a referral to GI. Reports oral ulcers noted during hospitalization are improved, but continues to have dark patches on his tongue. Since discharge, he has noted worsening dryness and peeling of his hands and bilateral leg edema. Denies orthopnea or paroxysmal nocturnal dyspnea. Denies fever. Denies any new medications and did not receive antibiotics during hospitalization. Nonsmoker.  Review of Systems Per HPI    Objective:   Physical Exam  Constitutional: He appears well-developed and well-nourished. No distress.  HENT:  Pink conjunctiva. Tongue with patches of hyperpigmentation (included photo from recent hospitalization below)  Cardiovascular: Normal rate and regular rhythm.   No murmur heard. Pulmonary/Chest: Effort normal. No respiratory distress. He has no wheezes.  Abdominal: Soft. He exhibits no distension. There is no tenderness.  Musculoskeletal:  1+ pitting edema of lower extremities bilaterally to mid-calf  Skin:  Dry skin of hands bilaterally with 1-2 areas of peeling  Psychiatric: He has a normal mood and affect. His behavior is normal.         Assessment and Plan:       1. Anemia in other chronic diseases classified elsewhere POC hemoglobin stable at 8.9. Mildly tachycardic to 110 initially improved to 103 by end of exam, stable with prior HR. Unknown etiology of edema of legs bilaterally, could be secondary to anemia or known liver disease but will also check CMP, BNP, and TSH; previously on lasix, but this was held during hospitalization and restarted on discharge. Weight increased today, however there are no weights during the hospitalization during which time lasix was held. Will hold on increasing lasix pending kidney function results. Will obtain CBC.  Referral to GI placed as requested by inpatient team. Follow up in one week, return precautions given.   2. Oral lesion Referral to ENT.   UPDATE: Literature and Chart reviewed following patient encounter. Hyperpigmentation of tongue can be occasionally be seen in African Americans, but may also be secondary to medications (no history of tetracycline, linezolid, bismuth subsalicylate, antidepressants. Does have history of PPI use), adrenal insufficiency, Laugier-Hunziker Disease, Peutz Jeghers syndrome, or pellagra (history of alcohol abuse, skin peeling, rare in [place]).  Concern for Adrenal Insufficiency given tongue hyperpigmentation, hypotension, tachycardia, fatigue, and edema. Chart review shows low AM cortisol in 08/2016 to 6.2, however further labs were never obtained. Also with history of hyponatremia. Hospitalized in 08/2016 with weight loss and anorexia, which can be associated with adrenal insufficiency.  Called patient to notify him of my concerns. Reports edema is worsening, but does not note any shortness of breath or chest pain. Denies orthopnea and paroxysmal nocturnal dyspnea. Appointment scheduled for 6/7. Labs obtained show hyponatremia (stable), slightly worsened kidney function (1.59, up from 1.32; will hold on increasing lasix  for now), and slightly improved liver function. Albumin low at 2.9,  which may also be contributing to edema. BNP elevated to 398.4, no previous for comparison. TSH elevated to 5.540.  Recommend closer follow up--appointment scheduled for 6/4 with Dr. Lincoln Brigham. Will keep appointment scheduled on 6/7 as well, may cancel if determined it is not needed on next evaluation. Follow up weight and edema of legs. Obtain echocardiogram given elevated BNP and increased risk for strain on heart with prolonged anemia. Obtain T3 and T4. Consider repeating am cortisol as future lab visit vs. Initiating further workup for adrenal insufficiency (ACTH). Consider initiating steroids following labs. Return precautions discussed with patient--to go to ED immediately if chest pain, shortness of breath, or palpitations develop.   Forwarding to Dr. Lincoln Brigham as well as Drs. Ree Kida and [place] who are precepting the afternoon of 6/4.

## 2017-05-29 ENCOUNTER — Encounter: Payer: Self-pay | Admitting: Student

## 2017-05-29 ENCOUNTER — Ambulatory Visit (INDEPENDENT_AMBULATORY_CARE_PROVIDER_SITE_OTHER): Payer: Medicare PPO | Admitting: Student

## 2017-05-29 VITALS — BP 102/57 | HR 94 | Temp 98.3°F | Wt 170.0 lb

## 2017-05-29 DIAGNOSIS — K7030 Alcoholic cirrhosis of liver without ascites: Secondary | ICD-10-CM

## 2017-05-29 DIAGNOSIS — I10 Essential (primary) hypertension: Secondary | ICD-10-CM

## 2017-05-29 DIAGNOSIS — D649 Anemia, unspecified: Secondary | ICD-10-CM | POA: Diagnosis not present

## 2017-05-29 DIAGNOSIS — K140 Glossitis: Secondary | ICD-10-CM

## 2017-05-29 DIAGNOSIS — E039 Hypothyroidism, unspecified: Secondary | ICD-10-CM | POA: Insufficient documentation

## 2017-05-29 DIAGNOSIS — K148 Other diseases of tongue: Secondary | ICD-10-CM | POA: Diagnosis not present

## 2017-05-29 DIAGNOSIS — D61818 Other pancytopenia: Secondary | ICD-10-CM | POA: Diagnosis not present

## 2017-05-29 DIAGNOSIS — M7989 Other specified soft tissue disorders: Secondary | ICD-10-CM | POA: Insufficient documentation

## 2017-05-29 LAB — POCT HEMOGLOBIN: HEMOGLOBIN: 8.5 g/dL — AB (ref 14.1–18.1)

## 2017-05-29 NOTE — Patient Instructions (Signed)
Follow up with PCP in 2 weeks  Obtain the Echocardiogram of your heart If you have any questions or concerns, call the office at [phone]

## 2017-05-29 NOTE — Assessment & Plan Note (Signed)
Amb referral to GI made today

## 2017-05-29 NOTE — Assessment & Plan Note (Signed)
Stable swelling. His BP is low and while he does not have symptoms of hypotension now, if we do increase his lasix there is concern that he ay not tolerate it. BMP was elevated while inpatient. Additionally give his hepatic disease with low albumin this is likely contributing - will obtain echo, GI referral - compression hose and leg elevation for now

## 2017-05-29 NOTE — Assessment & Plan Note (Signed)
BP stable, somewhat low but no symptoms - continue lasix, spironolactone, propranolol

## 2017-05-29 NOTE — Assessment & Plan Note (Signed)
TSH elevated inpatient. Will obtain T3, Free T4

## 2017-05-29 NOTE — Assessment & Plan Note (Signed)
Seems to be improving, but will refer to derm today for evaluation

## 2017-05-29 NOTE — Progress Notes (Signed)
   Subjective:    Patient ID: William [name], male    DOB: 12-30-1960, 56 y.o.   MRN: [id number]   CC: hospital follow up  HPI: 56 y/o M presents for hospital follow up  Hospital follow up - The patient was admitted for anemia requiring 4 units of pRBCs - he was also noted to have leg swelling, pancytopenia concern for liver disease, and renal disease  Leg swelling - he feels his leg swelling has remained unchanged since dishcharge - he continues to take lasix as directed  Pancytopenia- he had a Bone marrow biopsy in the hospital for this which showed: - no evidence of abnormal cell lines - concern for vitamin deficiency or liver disease on initial review and on follow cytometry  Hypopthyroidism - TSH 5.54 in the hospital  GI follow up - Gi was consulted in the hospital  - liver US showed cirrhosis with no focal lesions - alcohol abstinence was recommended - he reports drinking a pint of gin regularly - taking propranolol, spironolactone and lasix without issue  Tongue lesions - noted to have tongue ulcerations and hyperpigmented lesions in the hospital - the patient feels the ulcerations are improving    Review of Systems  Per HPI, else denies recent illness, fever,  chest pain, shortness of breath,    Objective:  BP (!) 102/57   Pulse 94   Temp 98.3 F (36.8 C) (Oral)   Wt 170 lb (77.1 kg)   SpO2 97%   BMI 23.71 kg/m  Vitals and nursing note reviewed  General: NAD HEENT: ulceration measuring approximately 3-4 mm in diameter on left lateral side of tongue with hyperpigmented lesions present Cardiac: RRR Respiratory: CTAB, normal effort Extremities: 2+ bilateral LE edema Skin: warm and dry, no rashes noted Neuro: alert and oriented, no focal deficits   Assessment & Plan:    Hypertension BP stable, somewhat low but no symptoms - continue lasix, spironolactone, propranolol   Alcoholic cirrhosis of liver (HCC) Amb referral to GI made  today  Tongue ulcer Seems to be improving, but will refer to derm today for evaluation  Pancytopenia ([place]) Bone marrow biopsy consistent with chronic disease, like hepatic pathology likely from alcohol - CBC today to evaluate all cell lines - POC Hbg 8.9  Leg swelling Stable swelling. His BP is low and while he does not have symptoms of hypotension now, if we do increase his lasix there is concern that he ay not tolerate it. BMP was elevated while inpatient. Additionally give his hepatic disease with low albumin this is likely contributing - will obtain echo, GI referral - compression hose and leg elevation for now  Hypothyroidism TSH elevated inpatient. Will obtain T3, Free T4    Randie Tallarico A. Lincoln Brigham MD, [facility] Resident PGY-3 Pager [phone]

## 2017-05-29 NOTE — Assessment & Plan Note (Addendum)
Bone marrow biopsy consistent with chronic disease, like hepatic pathology likely from alcohol - CBC today to evaluate all cell lines - POC Hbg 8.9

## 2017-05-30 ENCOUNTER — Telehealth: Payer: Self-pay | Admitting: Family Medicine

## 2017-05-30 DIAGNOSIS — D649 Anemia, unspecified: Secondary | ICD-10-CM

## 2017-05-30 LAB — COMPREHENSIVE METABOLIC PANEL
A/G RATIO: 0.7 — AB (ref 1.2–2.2)
ALK PHOS: 143 IU/L — AB (ref 39–117)
ALT: 26 IU/L (ref 0–44)
AST: 109 IU/L — ABNORMAL HIGH (ref 0–40)
Albumin: 2.9 g/dL — ABNORMAL LOW (ref 3.5–5.5)
BUN / CREAT RATIO: 18 (ref 9–20)
BUN: 29 mg/dL — ABNORMAL HIGH (ref 6–24)
Bilirubin Total: 7.5 mg/dL — ABNORMAL HIGH (ref 0.0–1.2)
CHLORIDE: 104 mmol/L (ref 96–106)
CO2: 16 mmol/L — ABNORMAL LOW (ref 18–29)
Calcium: 8.6 mg/dL — ABNORMAL LOW (ref 8.7–10.2)
Creatinine, Ser: 1.64 mg/dL — ABNORMAL HIGH (ref 0.76–1.27)
GFR calc non Af Amer: 45 mL/min/{1.73_m2} — ABNORMAL LOW (ref >59)
GFR, EST AFRICAN AMERICAN: 53 mL/min/{1.73_m2} — AB (ref >59)
Globulin, Total: 3.9 g/dL (ref 1.5–4.5)
Glucose: 99 mg/dL (ref 65–99)
Potassium: 4.2 mmol/L (ref 3.5–5.2)
Sodium: 136 mmol/L (ref 134–144)
TOTAL PROTEIN: 6.8 g/dL (ref 6.0–8.5)

## 2017-05-30 LAB — T4, FREE: Free T4: 1.28 ng/dL (ref 0.82–1.77)

## 2017-05-30 LAB — CBC
HEMOGLOBIN: 8.4 g/dL — AB (ref 13.0–17.7)
Hematocrit: 25.6 % — ABNORMAL LOW (ref 37.5–51.0)
MCH: 29.2 pg (ref 26.6–33.0)
MCHC: 32.8 g/dL (ref 31.5–35.7)
MCV: 89 fL (ref 79–97)
Platelets: 142 10*3/uL — ABNORMAL LOW (ref 150–379)
RBC: 2.88 x10E6/uL — ABNORMAL LOW (ref 4.14–5.80)
RDW: 20.9 % — AB (ref 12.3–15.4)
WBC: 5.1 10*3/uL (ref 3.4–10.8)

## 2017-05-30 LAB — T3: T3 TOTAL: 62 ng/dL — AB (ref 71–180)

## 2017-05-30 NOTE — Telephone Encounter (Signed)
Will forward to MD to place referral for home health evaluation. Jazmin Hartsell,CMA

## 2017-05-30 NOTE — Telephone Encounter (Signed)
Patient's brother, Mariane Masters, calls requesting [place] for patient. States he is really needing help at home after this last hospitalization. Please advise. His number is [phone] and His sister Mortimer Fries, number is [phone].

## 2017-05-31 ENCOUNTER — Ambulatory Visit: Payer: Medicare PPO | Admitting: Physician Assistant

## 2017-05-31 NOTE — Telephone Encounter (Signed)
Referral placed.  Algis Greenhouse. Jerline Pain, [facility] Resident PGY-3 05/31/2017 9:45 AM

## 2017-06-01 ENCOUNTER — Ambulatory Visit: Payer: Medicare PPO | Admitting: Family Medicine

## 2017-06-05 ENCOUNTER — Encounter (HOSPITAL_COMMUNITY): Payer: Self-pay

## 2017-06-06 ENCOUNTER — Other Ambulatory Visit: Payer: Self-pay | Admitting: Family Medicine

## 2017-06-06 MED ORDER — SPIRONOLACTONE 25 MG PO TABS: 50.0000 mg | ORAL_TABLET | Freq: Every day | ORAL | 0 refills | Status: DC

## 2017-06-07 ENCOUNTER — Telehealth: Payer: Self-pay | Admitting: Family Medicine

## 2017-06-07 NOTE — Telephone Encounter (Signed)
Auth # [id number]. William [name] has been advised.

## 2017-06-07 NOTE — Telephone Encounter (Signed)
Pt has an appointment tomorrow for an echo, Butch Penny states they haven't received the pre cert from [facility] for pt's echo tomorrow. ep

## 2017-06-07 NOTE — Telephone Encounter (Signed)
Currently working this. I was never advised about this order.   Page, please make sure you advise me when ECHOS and dopplers are schedule. These do not fall into my WQ so the only way I know to check for Josem Kaufmann is if I am told about them.  Tia

## 2017-06-08 ENCOUNTER — Ambulatory Visit (HOSPITAL_COMMUNITY): Payer: Medicare PPO

## 2017-06-08 NOTE — Telephone Encounter (Signed)
Received call from [facility].  They have been unable to reach pt. They were going to cancel the appt but will try to reach Mariane Masters and Mortimer Fries (siblings) first.  Will then call us back if still unsuccessful Sumire Halbleib, Salome Spotted, CMA

## 2017-06-10 ENCOUNTER — Other Ambulatory Visit: Payer: Self-pay | Admitting: Family Medicine

## 2017-06-20 ENCOUNTER — Encounter: Payer: Self-pay | Admitting: *Deleted

## 2017-06-21 ENCOUNTER — Encounter (HOSPITAL_COMMUNITY): Payer: Self-pay

## 2017-06-24 ENCOUNTER — Emergency Department (HOSPITAL_COMMUNITY): Payer: Medicare PPO

## 2017-06-24 ENCOUNTER — Encounter (HOSPITAL_COMMUNITY): Payer: Self-pay | Admitting: Emergency Medicine

## 2017-06-24 ENCOUNTER — Inpatient Hospital Stay (HOSPITAL_COMMUNITY)
Admission: EM | Admit: 2017-06-24 | Discharge: 2017-07-04 | DRG: 982 | Disposition: A | Discharge status: Skilled Nursing Facility | Payer: Medicare PPO | Attending: Family Medicine | Admitting: Family Medicine

## 2017-06-24 DIAGNOSIS — N179 Acute kidney failure, unspecified: Secondary | ICD-10-CM | POA: Diagnosis present

## 2017-06-24 DIAGNOSIS — Z8261 Family history of arthritis: Secondary | ICD-10-CM

## 2017-06-24 DIAGNOSIS — Z419 Encounter for procedure for purposes other than remedying health state, unspecified: Secondary | ICD-10-CM

## 2017-06-24 DIAGNOSIS — E876 Hypokalemia: Secondary | ICD-10-CM | POA: Diagnosis present

## 2017-06-24 DIAGNOSIS — Z8249 Family history of ischemic heart disease and other diseases of the circulatory system: Secondary | ICD-10-CM

## 2017-06-24 DIAGNOSIS — D649 Anemia, unspecified: Secondary | ICD-10-CM

## 2017-06-24 DIAGNOSIS — S42292A Other displaced fracture of upper end of left humerus, initial encounter for closed fracture: Secondary | ICD-10-CM

## 2017-06-24 DIAGNOSIS — E46 Unspecified protein-calorie malnutrition: Secondary | ICD-10-CM | POA: Diagnosis present

## 2017-06-24 DIAGNOSIS — R58 Hemorrhage, not elsewhere classified: Secondary | ICD-10-CM

## 2017-06-24 DIAGNOSIS — D61818 Other pancytopenia: Secondary | ICD-10-CM | POA: Diagnosis not present

## 2017-06-24 DIAGNOSIS — K7030 Alcoholic cirrhosis of liver without ascites: Secondary | ICD-10-CM | POA: Diagnosis present

## 2017-06-24 DIAGNOSIS — Z841 Family history of disorders of kidney and ureter: Secondary | ICD-10-CM

## 2017-06-24 DIAGNOSIS — Z79899 Other long term (current) drug therapy: Secondary | ICD-10-CM

## 2017-06-24 DIAGNOSIS — S42212A Unspecified displaced fracture of surgical neck of left humerus, initial encounter for closed fracture: Secondary | ICD-10-CM | POA: Diagnosis present

## 2017-06-24 DIAGNOSIS — I8510 Secondary esophageal varices without bleeding: Secondary | ICD-10-CM | POA: Diagnosis present

## 2017-06-24 DIAGNOSIS — W19XXXA Unspecified fall, initial encounter: Secondary | ICD-10-CM

## 2017-06-24 DIAGNOSIS — Z8546 Personal history of malignant neoplasm of prostate: Secondary | ICD-10-CM

## 2017-06-24 DIAGNOSIS — W010XXA Fall on same level from slipping, tripping and stumbling without subsequent striking against object, initial encounter: Secondary | ICD-10-CM | POA: Diagnosis present

## 2017-06-24 DIAGNOSIS — R000 Tachycardia, unspecified: Secondary | ICD-10-CM | POA: Diagnosis present

## 2017-06-24 DIAGNOSIS — S42202D Unspecified fracture of upper end of left humerus, subsequent encounter for fracture with routine healing: Secondary | ICD-10-CM

## 2017-06-24 DIAGNOSIS — E871 Hypo-osmolality and hyponatremia: Secondary | ICD-10-CM | POA: Diagnosis present

## 2017-06-24 DIAGNOSIS — T148XXA Other injury of unspecified body region, initial encounter: Secondary | ICD-10-CM

## 2017-06-24 DIAGNOSIS — N183 Chronic kidney disease, stage 3 (moderate): Secondary | ICD-10-CM | POA: Diagnosis present

## 2017-06-24 DIAGNOSIS — I129 Hypertensive chronic kidney disease with stage 1 through stage 4 chronic kidney disease, or unspecified chronic kidney disease: Secondary | ICD-10-CM | POA: Diagnosis present

## 2017-06-24 DIAGNOSIS — F1020 Alcohol dependence, uncomplicated: Secondary | ICD-10-CM | POA: Diagnosis present

## 2017-06-24 HISTORY — DX: Heart failure, unspecified: I50.9

## 2017-06-24 LAB — COMPREHENSIVE METABOLIC PANEL
ALBUMIN: 2.2 g/dL — AB (ref 3.5–5.0)
ALK PHOS: 108 U/L (ref 38–126)
ALT: 35 U/L (ref 17–63)
ANION GAP: 11 (ref 5–15)
AST: 141 U/L — AB (ref 15–41)
BUN: 12 mg/dL (ref 6–20)
CALCIUM: 7.8 mg/dL — AB (ref 8.9–10.3)
CO2: 20 mmol/L — ABNORMAL LOW (ref 22–32)
Chloride: 97 mmol/L — ABNORMAL LOW (ref 101–111)
Creatinine, Ser: 1.81 mg/dL — ABNORMAL HIGH (ref 0.61–1.24)
GFR calc Af Amer: 46 mL/min — ABNORMAL LOW (ref >60)
GFR calc non Af Amer: 40 mL/min — ABNORMAL LOW (ref >60)
GLUCOSE: 107 mg/dL — AB (ref 65–99)
Potassium: 3.3 mmol/L — ABNORMAL LOW (ref 3.5–5.1)
SODIUM: 128 mmol/L — AB (ref 135–145)
Total Bilirubin: 7.9 mg/dL — ABNORMAL HIGH (ref 0.3–1.2)
Total Protein: 6.5 g/dL (ref 6.5–8.1)

## 2017-06-24 LAB — BRAIN NATRIURETIC PEPTIDE: B NATRIURETIC PEPTIDE 5: 70.4 pg/mL (ref 0.0–100.0)

## 2017-06-24 LAB — LIPASE, BLOOD: Lipase: 54 U/L — ABNORMAL HIGH (ref 11–51)

## 2017-06-24 LAB — ETHANOL: Alcohol, Ethyl (B): <5 mg/dL (ref <5)

## 2017-06-24 MED ORDER — SODIUM CHLORIDE 0.9 % IV BOLUS (SEPSIS)
1000.0000 mL | Freq: Once | INTRAVENOUS | Status: AC
  Administered 2017-06-25: 1000 mL via INTRAVENOUS

## 2017-06-24 MED ORDER — HYDROMORPHONE HCL 1 MG/ML IJ SOLN
1.0000 mg | Freq: Once | INTRAMUSCULAR | Status: AC
  Administered 2017-06-24: 1 mg via INTRAMUSCULAR
  Filled 2017-06-24: qty 1

## 2017-06-24 MED ORDER — SODIUM CHLORIDE 0.9 % IV BOLUS (SEPSIS)
1000.0000 mL | Freq: Once | INTRAVENOUS | Status: AC
  Administered 2017-06-24: 1000 mL via INTRAVENOUS

## 2017-06-24 MED ORDER — LORAZEPAM 2 MG/ML IJ SOLN
1.0000 mg | Freq: Once | INTRAMUSCULAR | Status: AC
  Administered 2017-06-25: 1 mg via INTRAVENOUS
  Filled 2017-06-24: qty 1

## 2017-06-24 NOTE — ED Notes (Signed)
Patient transported to X-ray 

## 2017-06-24 NOTE — ED Provider Notes (Addendum)
[facility] DEPT Provider Note   CSN: [id number] Arrival date & time: 06/24/17  1943     History   Chief Complaint Chief Complaint  Patient presents with  . Shoulder Injury    HPI William [name] is a 56 y.o. male.  HPI  Patient presents with weakness, and after a fall that occurred 2 days ago. Patient is here with his brother and sister who assists with the history of present illness. Patient notes that 2 days ago he stumbled, fell onto his left side Is unclear if this occurred due to increasing weakness, but the patient does complain of generally increased weakness over the past weeks. Family indicates the patient has not been compliant with his medication during that timeframe. Patient himself denies fever, chills, vomiting, diarrhea. He acknowledges medication noncompliance, and continued alcohol use. Since the fall he has had persistent severe sore pain throughout the left upper arm.   Past Medical History:  Diagnosis Date  . Anemia   . CHF (congestive heart failure) ([place])   . Chronic kidney disease   . Cirrhosis of liver ([place])   . COMPRESSION FRACTURE, LUMBAR VERTEBRAE 07/29/2010   Qualifier: Diagnosis of  By: Anabel Bene    . Hypertension   . Prostate cancer [facility])     Patient Active Problem List   Diagnosis Date Noted  . Leg swelling 05/29/2017  . Hypothyroidism 05/29/2017  . Tongue ulcer   . Hyponatremia   . Acute kidney injury (nontraumatic) ([place])   . Anemia 05/17/2017  . Dark urine 09/16/2016  . Diarrhea   . Colon wall thickening   . Anemia due to bone marrow failure ([place])   . Malnutrition of moderate degree 09/10/2016  . Unintentional weight loss   . Dehydration   . Hypomagnesemia   . Hypokalemia   . Hypocalcemia   . Vomiting, persistent, in adult   . Intractable hiccups   . Decreased appetite 09/09/2016  . Symptomatic anemia 09/09/2016  . Chronic pain 02/08/2016  . Pancytopenia ([place]) 05/29/2015  . History of esophageal varices  08/20/2012  . Shoulder pain, right 06/27/2012  . Gout 06/27/2012  . MGUS (monoclonal gammopathy of unknown significance) 06/15/2012  . Hypertension 02/28/2012  . Cervical pain (neck) 01/16/2012  . Alcohol abuse 01/16/2012  . Chronic kidney disease (CKD), stage III (moderate) 12/13/2011  . Erectile dysfunction 12/13/2011  . Back pain 08/11/2011  . Shoulder pain, left 02/07/2011  . Knee pain 02/07/2011  . Cirrhosis ([place]) 08/04/2010  . Alcoholic cirrhosis of liver ([place]) 02/23/2010    Past Surgical History:  Procedure Laterality Date  . ACHILLES TENDON REPAIR    . ESOPHAGOGASTRODUODENOSCOPY (EGD) WITH PROPOFOL N/A 09/13/2016   Procedure: ESOPHAGOGASTRODUODENOSCOPY (EGD) WITH PROPOFOL;  Surgeon: Wonda Horner, MD;  Location: [facility] ENDOSCOPY;  Service: Endoscopy;  Laterality: N/A;       Home Medications    Prior to Admission medications   Medication Sig Start Date End Date Taking? Authorizing Provider  allopurinol (ZYLOPRIM) 100 MG tablet TAKE ONE TABLET BY MOUTH ONCE DAILY 01/05/17   Vivi Barrack, MD  colchicine (COLCRYS) 0.6 MG tablet Take 0.5 tablets (0.3 mg total) by mouth daily. Patient not taking: Reported on 05/29/2017 05/25/17   Lorna Few, DO  folic acid (FOLVITE) 1 MG tablet TAKE ONE TABLET BY MOUTH DAILY 09/21/16   Vivi Barrack, MD  furosemide (LASIX) 20 MG tablet TAKE 1 TABLET BY MOUTH ONCE DAILY 06/12/17   Vivi Barrack, MD  gabapentin (NEURONTIN) 100 MG  capsule Take 1 capsule (100 mg total) by mouth 3 (three) times daily. 05/25/17   Rumley,  N, DO  IRON PO Take 1 tablet by mouth daily.    [provider]  omeprazole (PRILOSEC) 40 MG capsule TAKE ONE CAPSULE BY MOUTH ONCE DAILY Patient not taking: Reported on 05/29/2017 08/09/16   Vivi Barrack, MD  propranolol (INDERAL) 40 MG tablet TAKE ONE TABLET BY MOUTH TWICE DAILY 08/09/16   Vivi Barrack, MD  spironolactone (ALDACTONE) 25 MG tablet Take 2 tablets (50 mg total) by mouth daily. 06/06/17 06/27/17   Vivi Barrack, MD  thiamine (VITAMIN B-1) 100 MG tablet Take 100 mg by mouth daily.    [provider]    Family History Family History  Problem Relation Age of Onset  . Kidney disease Mother   . Arthritis Mother   . Hypertension Father     Social History Social History  Substance Use Topics  . Smoking status: Never Smoker  . Smokeless tobacco: Never Used  . Alcohol use Yes     Comment: 1 pint per week     Allergies   Patient has no known allergies.   Review of Systems Review of Systems  Constitutional:       Per HPI, otherwise negative  HENT:       Per HPI, otherwise negative  Eyes:       New scleral icterus  Respiratory:       Per HPI, otherwise negative  Cardiovascular:       Per HPI, otherwise negative  Gastrointestinal: Negative for vomiting.  Endocrine:       Negative aside from HPI  Genitourinary:       Neg aside from HPI   Musculoskeletal:       Per HPI, otherwise negative  Skin: Positive for color change.  Neurological: Positive for weakness. Negative for syncope.     Physical Exam Updated Vital Signs BP 112/65 (BP Location: Right Arm)   Pulse (!) 118   Temp 98.5 F (36.9 C) (Oral)   Resp 16   Ht 5\' 11"  (1.803 m)   Wt 83.9 kg (185 lb)   SpO2 96%   BMI 25.80 kg/m   Physical Exam  Constitutional: He is oriented to person, place, and time. He appears well-developed and well-nourished.  HENT:  Head: Normocephalic and atraumatic.  Eyes: Conjunctivae and EOM are normal. Scleral icterus is present.  Cardiovascular: Normal rate and regular rhythm.   Pulmonary/Chest: Effort normal. No stridor. No respiratory distress.  Abdominal: He exhibits no distension.  Musculoskeletal: He exhibits no edema.       Right shoulder: Normal.       Left shoulder: He exhibits tenderness, bony tenderness, swelling, deformity and pain.       Left elbow: He exhibits swelling.       Left wrist: Normal.       Arms: Neurological: He is alert and oriented  to person, place, and time.  Skin: Skin is warm and dry.  Psychiatric: He has a normal mood and affect.  Nursing note and vitals reviewed.    ED Treatments / Results  Labs (all labs ordered are listed, but only abnormal results are displayed) Labs Reviewed  COMPREHENSIVE METABOLIC PANEL - Abnormal; Notable for the following:       Result Value   Sodium 128 (*)    Potassium 3.3 (*)    Chloride 97 (*)    CO2 20 (*)    Glucose, Bld  107 (*)    Creatinine, Ser 1.81 (*)    Calcium 7.8 (*)    Albumin 2.2 (*)    AST 141 (*)    Total Bilirubin 7.9 (*)    GFR calc non Af Amer 40 (*)    GFR calc Af Amer 46 (*)    All other components within normal limits  LIPASE, BLOOD - Abnormal; Notable for the following:    Lipase 54 (*)    All other components within normal limits  ETHANOL  BRAIN NATRIURETIC PEPTIDE  CBC WITH DIFFERENTIAL/PLATELET  CBC WITH DIFFERENTIAL/PLATELET    Radiology Dg Shoulder Left  Result Date: 06/24/2017 CLINICAL DATA:  Fell onto shoulder with pain EXAM: LEFT SHOULDER - 2+ VIEW COMPARISON:  None. FINDINGS: Left AC joint is intact. The left lung apex is clear. Humeral head maintains its articulation with the glenoid, there is moderate to marked degenerative change. Markedly comminuted fracture involving the neck and proximal shaft of the humerus with multiple displaced bone fragments. A bowel 1/2 shaft diameter of displacement of the distal fracture fragment toward the midline with mild varus angulation. IMPRESSION: Markedly comminuted, and slightly displaced and angulated fracture involving the left humeral neck and proximal shaft. No dislocation of the humeral head. Electronically Signed   By: Donavan Foil M.D.   On: 06/24/2017 20:21    Procedures Procedures (including critical care time)  Medications Ordered in ED Medications  LORazepam (ATIVAN) injection 1 mg (not administered)  sodium chloride 0.9 % bolus 1,000 mL (not administered)  HYDROmorphone (DILAUDID)  injection 1 mg (1 mg Intramuscular Given 06/24/17 2104)  sodium chloride 0.9 % bolus 1,000 mL (0 mLs Intravenous Stopped 06/24/17 2226)   Cardiac 121 sinus tach abnormal Pressure 95/60  Initial Impression / Assessment and Plan / ED Course  I have reviewed the triage vital signs and the nursing notes.  Pertinent labs & imaging results that were available during my care of the patient were reviewed by me and considered in my medical decision making (see chart for details).  Patient tachycardic on initial exam.  On repeat exam the tachycardia has improved, but he has persistent discomfort in the left shoulder. I discussed patient's case with our orthopedist on call, Dr. Erlinda Hong, who recommends immobilization. Patient's initial labs are notable for hepatic dysfunction, though values are consistent with multiple prior studies.  Subsequently, remaining labs are available, notable for hemoglobin of 4.1, approximately 50% of his most recent value. Value was repeated to confirm accuracy. Hemoccult test was negative.   Patient has had splint applied by our ortho tech.  No complications, well tolerated.  Patient has been typed, cross, will receive 2 units of transfusion for symptomatic anemia. Given this, patient requires admission for further evaluation and management. As the patient has a Hx of etoh abuse, and is admitted, he was started on CIWA protocol.   Final Clinical Impressions(s) / ED Diagnoses  Fall, initial encounter Symptomatic anemia Left shoulder fracture, closed, initial encounter Alcohol abuse  CRITICAL CARE Performed by: Carmin Muskrat Total critical care time: 35 minutes Critical care time was exclusive of separately billable procedures and treating other patients. Critical care was necessary to treat or prevent imminent or life-threatening deterioration. Critical care was time spent personally by me on the following activities: development of treatment plan with patient  and/or surrogate as well as nursing, discussions with consultants, evaluation of patient's response to treatment, examination of patient, obtaining history from patient or surrogate, ordering and performing treatments and interventions, ordering and  review of laboratory studies, ordering and review of radiographic studies, pulse oximetry and re-evaluation of patient's condition.    Carmin Muskrat, MD 06/25/17 1464    Carmin Muskrat, MD 06/25/17 [phone]

## 2017-06-24 NOTE — ED Triage Notes (Signed)
Pt reports tripping and falling landing on L shoulder, fall occurred on Thursday.

## 2017-06-24 NOTE — ED Triage Notes (Signed)
Acuity changed to 2 per nursing judgment. D/t vitals, pt tachycardic in triage and BP soft.

## 2017-06-24 NOTE — ED Notes (Signed)
Lab states CBC, BNP sample appears diluted. Requesting re-draw.

## 2017-06-25 DIAGNOSIS — E46 Unspecified protein-calorie malnutrition: Secondary | ICD-10-CM | POA: Diagnosis present

## 2017-06-25 DIAGNOSIS — Z79899 Other long term (current) drug therapy: Secondary | ICD-10-CM | POA: Diagnosis not present

## 2017-06-25 DIAGNOSIS — R000 Tachycardia, unspecified: Secondary | ICD-10-CM | POA: Diagnosis present

## 2017-06-25 DIAGNOSIS — K7030 Alcoholic cirrhosis of liver without ascites: Secondary | ICD-10-CM | POA: Diagnosis present

## 2017-06-25 DIAGNOSIS — Z841 Family history of disorders of kidney and ureter: Secondary | ICD-10-CM | POA: Diagnosis not present

## 2017-06-25 DIAGNOSIS — M66822 Spontaneous rupture of other tendons, left upper arm: Secondary | ICD-10-CM | POA: Diagnosis not present

## 2017-06-25 DIAGNOSIS — W010XXA Fall on same level from slipping, tripping and stumbling without subsequent striking against object, initial encounter: Secondary | ICD-10-CM | POA: Diagnosis present

## 2017-06-25 DIAGNOSIS — Z8261 Family history of arthritis: Secondary | ICD-10-CM | POA: Diagnosis not present

## 2017-06-25 DIAGNOSIS — I8510 Secondary esophageal varices without bleeding: Secondary | ICD-10-CM | POA: Diagnosis present

## 2017-06-25 DIAGNOSIS — F1020 Alcohol dependence, uncomplicated: Secondary | ICD-10-CM | POA: Diagnosis present

## 2017-06-25 DIAGNOSIS — N183 Chronic kidney disease, stage 3 (moderate): Secondary | ICD-10-CM | POA: Diagnosis present

## 2017-06-25 DIAGNOSIS — D61818 Other pancytopenia: Secondary | ICD-10-CM | POA: Diagnosis present

## 2017-06-25 DIAGNOSIS — E876 Hypokalemia: Secondary | ICD-10-CM | POA: Diagnosis present

## 2017-06-25 DIAGNOSIS — E871 Hypo-osmolality and hyponatremia: Secondary | ICD-10-CM | POA: Diagnosis present

## 2017-06-25 DIAGNOSIS — N179 Acute kidney failure, unspecified: Secondary | ICD-10-CM | POA: Diagnosis present

## 2017-06-25 DIAGNOSIS — D649 Anemia, unspecified: Secondary | ICD-10-CM | POA: Diagnosis not present

## 2017-06-25 DIAGNOSIS — T148XXA Other injury of unspecified body region, initial encounter: Secondary | ICD-10-CM | POA: Diagnosis not present

## 2017-06-25 DIAGNOSIS — S42292A Other displaced fracture of upper end of left humerus, initial encounter for closed fracture: Secondary | ICD-10-CM | POA: Diagnosis not present

## 2017-06-25 DIAGNOSIS — S42212A Unspecified displaced fracture of surgical neck of left humerus, initial encounter for closed fracture: Secondary | ICD-10-CM | POA: Diagnosis present

## 2017-06-25 DIAGNOSIS — I129 Hypertensive chronic kidney disease with stage 1 through stage 4 chronic kidney disease, or unspecified chronic kidney disease: Secondary | ICD-10-CM | POA: Diagnosis present

## 2017-06-25 DIAGNOSIS — R58 Hemorrhage, not elsewhere classified: Secondary | ICD-10-CM | POA: Diagnosis not present

## 2017-06-25 DIAGNOSIS — Z8249 Family history of ischemic heart disease and other diseases of the circulatory system: Secondary | ICD-10-CM | POA: Diagnosis not present

## 2017-06-25 DIAGNOSIS — Z419 Encounter for procedure for purposes other than remedying health state, unspecified: Secondary | ICD-10-CM | POA: Diagnosis not present

## 2017-06-25 DIAGNOSIS — S42202A Unspecified fracture of upper end of left humerus, initial encounter for closed fracture: Secondary | ICD-10-CM | POA: Diagnosis not present

## 2017-06-25 DIAGNOSIS — Z8546 Personal history of malignant neoplasm of prostate: Secondary | ICD-10-CM | POA: Diagnosis not present

## 2017-06-25 DIAGNOSIS — W19XXXA Unspecified fall, initial encounter: Secondary | ICD-10-CM | POA: Diagnosis not present

## 2017-06-25 LAB — CBC WITH DIFFERENTIAL/PLATELET
Basophils Absolute: 0 10*3/uL (ref 0.0–0.1)
Basophils Relative: 0 %
Eosinophils Absolute: 0 10*3/uL (ref 0.0–0.7)
Eosinophils Relative: 1 %
HCT: 12.2 % — ABNORMAL LOW (ref 39.0–52.0)
HEMOGLOBIN: 4.1 g/dL — AB (ref 13.0–17.0)
LYMPHS ABS: 0.6 10*3/uL — AB (ref 0.7–4.0)
LYMPHS PCT: 26 %
MCH: 31.1 pg (ref 26.0–34.0)
MCHC: 33.6 g/dL (ref 30.0–36.0)
MCV: 92.4 fL (ref 78.0–100.0)
Monocytes Absolute: 0.2 10*3/uL (ref 0.1–1.0)
Monocytes Relative: 7 %
NEUTROS ABS: 1.5 10*3/uL — AB (ref 1.7–7.7)
NEUTROS PCT: 66 %
Platelets: 44 10*3/uL — ABNORMAL LOW (ref 150–400)
RBC: 1.32 MIL/uL — AB (ref 4.22–5.81)
RDW: 21 % — ABNORMAL HIGH (ref 11.5–15.5)
WBC: 2.2 10*3/uL — ABNORMAL LOW (ref 4.0–10.5)

## 2017-06-25 LAB — CBC
HCT: 17 % — ABNORMAL LOW (ref 39.0–52.0)
Hemoglobin: 5.9 g/dL — CL (ref 13.0–17.0)
MCH: 31.1 pg (ref 26.0–34.0)
MCHC: 34.7 g/dL (ref 30.0–36.0)
MCV: 89.5 fL (ref 78.0–100.0)
PLATELETS: 49 10*3/uL — AB (ref 150–400)
RBC: 1.9 MIL/uL — AB (ref 4.22–5.81)
RDW: 18.6 % — ABNORMAL HIGH (ref 11.5–15.5)
WBC: 2.5 10*3/uL — AB (ref 4.0–10.5)

## 2017-06-25 LAB — POC OCCULT BLOOD, ED: Fecal Occult Bld: NEGATIVE

## 2017-06-25 LAB — COMPREHENSIVE METABOLIC PANEL
ALK PHOS: 98 U/L (ref 38–126)
ALT: 31 U/L (ref 17–63)
ANION GAP: 9 (ref 5–15)
AST: 120 U/L — ABNORMAL HIGH (ref 15–41)
Albumin: 2 g/dL — ABNORMAL LOW (ref 3.5–5.0)
BILIRUBIN TOTAL: 9.4 mg/dL — AB (ref 0.3–1.2)
BUN: 16 mg/dL (ref 6–20)
CALCIUM: 7.4 mg/dL — AB (ref 8.9–10.3)
CO2: 17 mmol/L — ABNORMAL LOW (ref 22–32)
Chloride: 104 mmol/L (ref 101–111)
Creatinine, Ser: 1.5 mg/dL — ABNORMAL HIGH (ref 0.61–1.24)
GFR calc non Af Amer: 50 mL/min — ABNORMAL LOW (ref >60)
GFR, EST AFRICAN AMERICAN: 58 mL/min — AB (ref >60)
Glucose, Bld: 135 mg/dL — ABNORMAL HIGH (ref 65–99)
Potassium: 4 mmol/L (ref 3.5–5.1)
SODIUM: 130 mmol/L — AB (ref 135–145)
TOTAL PROTEIN: 6.2 g/dL — AB (ref 6.5–8.1)

## 2017-06-25 LAB — GLUCOSE, CAPILLARY: Glucose-Capillary: 110 mg/dL — ABNORMAL HIGH (ref 65–99)

## 2017-06-25 LAB — PREPARE RBC (CROSSMATCH)

## 2017-06-25 LAB — PROTIME-INR
INR: 1.54
PROTHROMBIN TIME: 18.6 s — AB (ref 11.4–15.2)

## 2017-06-25 LAB — APTT: aPTT: 35 seconds (ref 24–36)

## 2017-06-25 LAB — MAGNESIUM
MAGNESIUM: 1 mg/dL — AB (ref 1.7–2.4)
Magnesium: 1.2 mg/dL — ABNORMAL LOW (ref 1.7–2.4)

## 2017-06-25 LAB — AMMONIA: AMMONIA: 49 umol/L — AB (ref 9–35)

## 2017-06-25 LAB — PHOSPHORUS: Phosphorus: 2.4 mg/dL — ABNORMAL LOW (ref 2.5–4.6)

## 2017-06-25 MED ORDER — MAGNESIUM SULFATE 2 GM/50ML IV SOLN
2.0000 g | Freq: Once | INTRAVENOUS | Status: AC
  Administered 2017-06-25: 2 g via INTRAVENOUS
  Filled 2017-06-25 (×2): qty 50

## 2017-06-25 MED ORDER — LORAZEPAM 2 MG/ML IJ SOLN: 0.0000 mg | Freq: Four times a day (QID) | INTRAMUSCULAR | Status: DC

## 2017-06-25 MED ORDER — SODIUM CHLORIDE 0.9 % IV SOLN
INTRAVENOUS | Status: DC
  Administered 2017-06-25 – 2017-06-27 (×3): via INTRAVENOUS

## 2017-06-25 MED ORDER — ONDANSETRON HCL 4 MG PO TABS: 4.0000 mg | ORAL_TABLET | Freq: Four times a day (QID) | ORAL | Status: DC | PRN

## 2017-06-25 MED ORDER — LORAZEPAM 2 MG/ML IJ SOLN: 0.0000 mg | Freq: Two times a day (BID) | INTRAMUSCULAR | Status: DC

## 2017-06-25 MED ORDER — NALOXONE HCL 0.4 MG/ML IJ SOLN
0.4000 mg | INTRAMUSCULAR | Status: DC | PRN
  Filled 2017-06-25: qty 1

## 2017-06-25 MED ORDER — SENNOSIDES-DOCUSATE SODIUM 8.6-50 MG PO TABS
1.0000 | ORAL_TABLET | Freq: Every day | ORAL | Status: DC
  Administered 2017-06-27 – 2017-07-02 (×4): 1 via ORAL
  Filled 2017-06-25 (×7): qty 1

## 2017-06-25 MED ORDER — THIAMINE HCL 100 MG/ML IJ SOLN: 100.0000 mg | Freq: Every day | INTRAMUSCULAR | Status: DC

## 2017-06-25 MED ORDER — SODIUM CHLORIDE 0.9% FLUSH
3.0000 mL | Freq: Two times a day (BID) | INTRAVENOUS | Status: DC
  Administered 2017-06-25 – 2017-06-26 (×2): 3 mL via INTRAVENOUS

## 2017-06-25 MED ORDER — ONDANSETRON HCL 4 MG/2ML IJ SOLN: 4.0000 mg | Freq: Four times a day (QID) | INTRAMUSCULAR | Status: DC | PRN

## 2017-06-25 MED ORDER — LORAZEPAM 1 MG PO TABS: 0.0000 mg | ORAL_TABLET | Freq: Four times a day (QID) | ORAL | Status: DC

## 2017-06-25 MED ORDER — FENTANYL CITRATE (PF) 100 MCG/2ML IJ SOLN
25.0000 ug | INTRAMUSCULAR | Status: DC | PRN
  Administered 2017-06-26 – 2017-07-01 (×11): 25 ug via INTRAVENOUS
  Filled 2017-06-25 (×11): qty 2

## 2017-06-25 MED ORDER — POTASSIUM CHLORIDE CRYS ER 20 MEQ PO TBCR
30.0000 meq | EXTENDED_RELEASE_TABLET | Freq: Two times a day (BID) | ORAL | Status: DC
  Administered 2017-06-25 – 2017-06-29 (×10): 30 meq via ORAL
  Filled 2017-06-25 (×11): qty 1

## 2017-06-25 MED ORDER — LORAZEPAM 0.5 MG PO TABS: 0.5000 mg | ORAL_TABLET | Freq: Four times a day (QID) | ORAL | Status: AC | PRN

## 2017-06-25 MED ORDER — MAGNESIUM SULFATE 2 GM/50ML IV SOLN
2.0000 g | Freq: Once | INTRAVENOUS | Status: AC
  Administered 2017-06-25: 2 g via INTRAVENOUS
  Filled 2017-06-25: qty 50

## 2017-06-25 MED ORDER — SODIUM CHLORIDE 0.9 % IV SOLN
10.0000 mL/h | Freq: Once | INTRAVENOUS | Status: AC
  Administered 2017-06-25: 10 mL/h via INTRAVENOUS

## 2017-06-25 MED ORDER — PANTOPRAZOLE SODIUM 40 MG IV SOLR
40.0000 mg | Freq: Two times a day (BID) | INTRAVENOUS | Status: DC
  Administered 2017-06-25 – 2017-06-30 (×12): 40 mg via INTRAVENOUS
  Filled 2017-06-25 (×12): qty 40

## 2017-06-25 MED ORDER — LORAZEPAM 2 MG/ML IJ SOLN: 0.5000 mg | Freq: Four times a day (QID) | INTRAMUSCULAR | Status: AC | PRN

## 2017-06-25 MED ORDER — ACETAMINOPHEN 325 MG PO TABS
650.0000 mg | ORAL_TABLET | Freq: Once | ORAL | Status: AC
  Administered 2017-06-25: 650 mg via ORAL
  Filled 2017-06-25: qty 2

## 2017-06-25 MED ORDER — LORAZEPAM 1 MG PO TABS: 0.0000 mg | ORAL_TABLET | Freq: Two times a day (BID) | ORAL | Status: DC

## 2017-06-25 MED ORDER — SODIUM CHLORIDE 0.9 % IV SOLN
Freq: Once | INTRAVENOUS | Status: AC
  Administered 2017-06-25: 21:00:00 via INTRAVENOUS

## 2017-06-25 MED ORDER — ADULT MULTIVITAMIN W/MINERALS CH
1.0000 | ORAL_TABLET | Freq: Every day | ORAL | Status: DC
  Administered 2017-06-25 – 2017-07-04 (×10): 1 via ORAL
  Filled 2017-06-25 (×11): qty 1

## 2017-06-25 MED ORDER — VITAMIN B-1 100 MG PO TABS
100.0000 mg | ORAL_TABLET | Freq: Every day | ORAL | Status: DC
  Administered 2017-06-25 – 2017-07-04 (×10): 100 mg via ORAL
  Filled 2017-06-25 (×11): qty 1

## 2017-06-25 NOTE — ED Notes (Signed)
Admitting Provider at bedside. 

## 2017-06-25 NOTE — Discharge Summary (Signed)
[facility] Discharge Summary  Patient name: William [name] Medical record number: [id number] Date of birth: March 28, 1961 Age: 56 y.o. Gender: male Date of Admission: 06/24/2017  Date of Discharge: 07/03/17 Admitting Physician: Lind Covert, MD  Primary Care Provider: Marjie Skiff, MD Consultants: orthopedic surgery, PT, OT  Indication for Hospitalization: symptomatic anemia  Discharge Diagnoses/Problem List:  Anemia Left humeral fracture AKI, CKD3 Alcohol abuse Alcoholic cirrhosis Pancytopenia Hyponatremia Hypokalemia Hypomagnesemia Malnutrition   Disposition: SNF placement at [facility]   Discharge Condition: stable, improved  Discharge Exam: Please refer to progress note from day of discharge   Brief Hospital Course:  William [name] is a 56 year old male with PMH of chronic anemia, alcoholic cirrhosis, esophageal varices, polyclonal elevation of gamma globulins, HTN, and CKD3 who presented to [facility] ED with left shoulder pain after a fall. He was found to have a left humeral fracture with displacement. Ortho was consulted and applied splint in ED. His hemoglobin in ED was low to 4.1, platelets 44. Patient was admitted to [facility] for anemia and was transfused with total of 10 units of PRBC and 1 unit FFP with improvement in hemoglobin to at discharge to 8.9. FOBT was negative on admission. Ortho recommended ORIF procedure of left proximal humerus, done on 7/6, and recommended outpatient follow up in 2 weeks post op. PT recommendations of SNF placement as well.    Issues for Follow Up:  1. Outpatient ortho f/u for left humerus ORIF 2. Follow up with PCP for hospital admission   Significant Procedures: ORIF Left proximal humerus fx repare 7/6   Significant Labs and Imaging:   Recent Labs Lab 07/01/17 0306 07/02/17 0541 07/03/17 0447  WBC 2.3* 3.0* 2.8*  HGB 9.2* 9.4* 8.9*  HCT 28.3* 29.0* 28.0*  PLT 58* 66* 67*     Recent Labs Lab 06/27/17 0545 06/28/17 0600 06/29/17 0731 06/30/17 0530 06/30/17 0747 06/30/17 1456 07/01/17 0306 07/02/17 0541 07/03/17 0447  NA  --  130* 133* 130*  --  138 134* 132* 133*  K  --  4.6 5.0 5.2*  --  4.3 4.9 4.4 4.5  CL  --  106 108 109  --   --  109 109 109  CO2  --  15* 17* 17*  --   --  18* 19* 20*  GLUCOSE  --  96 86 85  --  70 156* 111* 87  BUN  --  13 16 16   --   --  19 21* 28*  CREATININE  --  1.16 0.99 1.14  --   --  1.38* 1.22 1.33*  CALCIUM  --  7.6* 8.3* 8.7*  --   --  8.6* 8.8* 8.8*  MG  --  1.3* 1.3*  --  1.5*  --  1.6*  --  1.9  PHOS 1.1* 2.1* 2.3*  --  3.1  --  4.4  --   --   ALKPHOS  --   --   --   --   --   --  83  --  107  AST  --   --   --   --   --   --  78*  --  131*  ALT  --   --   --   --   --   --  23  --  31  ALBUMIN  --   --   --   --   --   --  2.0*  --  2.1*   Ct Chest W Contrast  Result Date: 06/27/2017 CLINICAL DATA:  Acute onset of upper back pain. Decreased hemoglobin. Assess for underlying hemorrhage. Initial encounter. EXAM: CT CHEST, ABDOMEN, AND PELVIS WITH CONTRAST TECHNIQUE: Multidetector CT imaging of the chest, abdomen and pelvis was performed following the standard protocol during bolus administration of intravenous contrast. CONTRAST:  184mL ISOVUE-300 IOPAMIDOL (ISOVUE-300) INJECTION 61% COMPARISON:  CT of the abdomen performed 09/10/2016, and chest radiograph performed 05/17/2017. CT of the left shoulder performed 06/26/2017 FINDINGS: CT CHEST FINDINGS Cardiovascular: Scattered coronary artery calcifications are seen. The heart remains normal in size. Mild calcification is noted along the thoracic aorta. The proximal great vessels are grossly unremarkable. Mediastinum/Nodes: The mediastinum is otherwise unremarkable. No mediastinal lymphadenopathy is seen. No pericardial effusion is identified. The thyroid gland is unremarkable. No axillary lymphadenopathy is seen. Lungs/Pleura: Patchy bibasilar atelectasis is noted. Trace  left-sided pleural fluid is seen. No pneumothorax is identified. No masses are seen. Mild emphysematous change is noted bilaterally. Musculoskeletal: There is a comminuted and displaced fracture of the left humeral neck, as recently noted. A vague hematoma is seen tracking about the left shoulder and left axilla, measuring approximately 9.7 x 8.2 x 6.6 cm. There is chronic joint space loss at the glenohumeral joints bilaterally. There is asymmetric prominence of the left pectoralis musculature, thought to reflect mild intramuscular hemorrhage. Overlying soft tissue injury is noted tracking along the left lateral chest wall. There is diffuse asymmetric prominence of the musculature along the left lateral abdominal wall, with overlying fluid, likely reflecting soft tissue injury and mild hemorrhage. Mild bilateral gynecomastia is incidentally noted. CT ABDOMEN PELVIS FINDINGS Hepatobiliary: There is a diffusely nodular contour to the liver, compatible with hepatic cirrhosis. No definite dominant mass is seen within the liver. Trace ascites is seen tracking about the liver. The portal venous system remains grossly patent. Stones are noted dependently within the gallbladder. The gallbladder is otherwise grossly unremarkable. The common bile duct remains normal in caliber. Pancreas: The pancreas is within normal limits. Spleen: The spleen is enlarged, measuring 17.5 cm in length. Adrenals/Urinary Tract: The adrenal glands are unremarkable in appearance. The kidneys are within normal limits. There is no evidence of hydronephrosis. No renal or ureteral stones are identified. Nonspecific perinephric stranding is noted bilaterally. Stomach/Bowel: The stomach is unremarkable in appearance. The small bowel is within normal limits. The appendix is normal in caliber, without evidence of appendicitis. The colon is unremarkable in appearance. Vascular/Lymphatic: Scattered calcification is seen along the abdominal aorta and its  branches. The abdominal aorta is otherwise grossly unremarkable. The inferior vena cava is grossly unremarkable. No retroperitoneal lymphadenopathy is seen. No pelvic sidewall lymphadenopathy is identified. Reproductive: The bladder is mildly distended and grossly unremarkable. The prostate remains normal in size. Other: A small amount ascites is noted within the pelvis and along the paracolic gutters bilaterally. Musculoskeletal: No acute osseous abnormalities are identified. There is chronic loss of height involving the superior endplate of L2. Soft tissue edema is noted overlying the hips bilaterally. The visualized musculature is unremarkable in appearance. IMPRESSION: 1. Comminuted displaced fracture of the left humeral neck, with vague soft tissue hematoma tracking about the left shoulder and left axilla, measuring approximately 9.7 x 8.2 x 6.6 cm. 2. Asymmetric prominence of the left pectoralis musculature, thought to reflect mild intramuscular hemorrhage. Overlying soft tissue injury tracking along the left lateral chest wall. 3. Diffuse asymmetric prominence of the musculature along the left lateral abdominal wall, with  overlying fluid, likely reflecting soft tissue injury and mild hemorrhage. 4. Findings of hepatic cirrhosis, with trace ascites noted within the abdomen and pelvis. 5. Splenomegaly. 6. Scattered aortic atherosclerosis. 7. Scattered coronary artery calcifications seen. 8. Patchy bibasilar atelectasis noted.  Trace left pleural effusion. 9. Cholelithiasis.  Gallbladder otherwise grossly unremarkable. 10. Chronic loss of height involving the superior endplate of L2. 11. Soft tissue edema overlying the hips bilaterally. Electronically Signed   By: Garald Balding M.D.   On: 06/27/2017 21:46   Ct Abdomen Pelvis W Contrast  Result Date: 06/27/2017 CLINICAL DATA:  Acute onset of upper back pain. Decreased hemoglobin. Assess for underlying hemorrhage. Initial encounter. EXAM: CT CHEST, ABDOMEN,  AND PELVIS WITH CONTRAST TECHNIQUE: Multidetector CT imaging of the chest, abdomen and pelvis was performed following the standard protocol during bolus administration of intravenous contrast. CONTRAST:  172mL ISOVUE-300 IOPAMIDOL (ISOVUE-300) INJECTION 61% COMPARISON:  CT of the abdomen performed 09/10/2016, and chest radiograph performed 05/17/2017. CT of the left shoulder performed 06/26/2017 FINDINGS: CT CHEST FINDINGS Cardiovascular: Scattered coronary artery calcifications are seen. The heart remains normal in size. Mild calcification is noted along the thoracic aorta. The proximal great vessels are grossly unremarkable. Mediastinum/Nodes: The mediastinum is otherwise unremarkable. No mediastinal lymphadenopathy is seen. No pericardial effusion is identified. The thyroid gland is unremarkable. No axillary lymphadenopathy is seen. Lungs/Pleura: Patchy bibasilar atelectasis is noted. Trace left-sided pleural fluid is seen. No pneumothorax is identified. No masses are seen. Mild emphysematous change is noted bilaterally. Musculoskeletal: There is a comminuted and displaced fracture of the left humeral neck, as recently noted. A vague hematoma is seen tracking about the left shoulder and left axilla, measuring approximately 9.7 x 8.2 x 6.6 cm. There is chronic joint space loss at the glenohumeral joints bilaterally. There is asymmetric prominence of the left pectoralis musculature, thought to reflect mild intramuscular hemorrhage. Overlying soft tissue injury is noted tracking along the left lateral chest wall. There is diffuse asymmetric prominence of the musculature along the left lateral abdominal wall, with overlying fluid, likely reflecting soft tissue injury and mild hemorrhage. Mild bilateral gynecomastia is incidentally noted. CT ABDOMEN PELVIS FINDINGS Hepatobiliary: There is a diffusely nodular contour to the liver, compatible with hepatic cirrhosis. No definite dominant mass is seen within the liver.  Trace ascites is seen tracking about the liver. The portal venous system remains grossly patent. Stones are noted dependently within the gallbladder. The gallbladder is otherwise grossly unremarkable. The common bile duct remains normal in caliber. Pancreas: The pancreas is within normal limits. Spleen: The spleen is enlarged, measuring 17.5 cm in length. Adrenals/Urinary Tract: The adrenal glands are unremarkable in appearance. The kidneys are within normal limits. There is no evidence of hydronephrosis. No renal or ureteral stones are identified. Nonspecific perinephric stranding is noted bilaterally. Stomach/Bowel: The stomach is unremarkable in appearance. The small bowel is within normal limits. The appendix is normal in caliber, without evidence of appendicitis. The colon is unremarkable in appearance. Vascular/Lymphatic: Scattered calcification is seen along the abdominal aorta and its branches. The abdominal aorta is otherwise grossly unremarkable. The inferior vena cava is grossly unremarkable. No retroperitoneal lymphadenopathy is seen. No pelvic sidewall lymphadenopathy is identified. Reproductive: The bladder is mildly distended and grossly unremarkable. The prostate remains normal in size. Other: A small amount ascites is noted within the pelvis and along the paracolic gutters bilaterally. Musculoskeletal: No acute osseous abnormalities are identified. There is chronic loss of height involving the superior endplate of L2. Soft tissue edema is  noted overlying the hips bilaterally. The visualized musculature is unremarkable in appearance. IMPRESSION: 1. Comminuted displaced fracture of the left humeral neck, with vague soft tissue hematoma tracking about the left shoulder and left axilla, measuring approximately 9.7 x 8.2 x 6.6 cm. 2. Asymmetric prominence of the left pectoralis musculature, thought to reflect mild intramuscular hemorrhage. Overlying soft tissue injury tracking along the left lateral  chest wall. 3. Diffuse asymmetric prominence of the musculature along the left lateral abdominal wall, with overlying fluid, likely reflecting soft tissue injury and mild hemorrhage. 4. Findings of hepatic cirrhosis, with trace ascites noted within the abdomen and pelvis. 5. Splenomegaly. 6. Scattered aortic atherosclerosis. 7. Scattered coronary artery calcifications seen. 8. Patchy bibasilar atelectasis noted.  Trace left pleural effusion. 9. Cholelithiasis.  Gallbladder otherwise grossly unremarkable. 10. Chronic loss of height involving the superior endplate of L2. 11. Soft tissue edema overlying the hips bilaterally. Electronically Signed   By: Garald Balding M.D.   On: 06/27/2017 21:46   Ct Shoulder Left Wo Contrast  Result Date: 06/27/2017 CLINICAL DATA:  [place] and fractured left shoulder on 06/24/2017 EXAM: CT OF THE UPPER LEFT EXTREMITY WITHOUT CONTRAST TECHNIQUE: Multidetector CT imaging of the upper left extremity was performed according to the standard protocol. COMPARISON:  None. FINDINGS: There is a severely comminuted and displaced fracture involving the humeral neck and upper humeral shaft. No fracture of the greater or lesser tuberosities. There is 1 shaft width (approximately 2.5 cm) anterior displacement of the humeral shaft. Severe glenohumeral joint degenerative changes with complete cartilage loss, widening, thinning and spurring of the glenoid and significant subchondral cystic change. Rounded 12 mm ossified loose body noted in the subcoracoid bursa. The [facility] joint is intact. No scapula or clavicle fracture. The visualized left ribs are intact. The visualized left lung is clear. Significant fluid/ edema and hematoma around the fracture. IMPRESSION: 1. Severely comminuted and displaced fracture involving the humeral neck and proximal shaft. 2. No humeral head fracture. 3. Severe glenohumeral joint degenerative changes. Electronically Signed   By: Marijo Sanes M.D.   On: 06/27/2017 09:35   Dg  Shoulder Left  Result Date: 06/24/2017 CLINICAL DATA:  Fell onto shoulder with pain EXAM: LEFT SHOULDER - 2+ VIEW COMPARISON:  None. FINDINGS: Left AC joint is intact. The left lung apex is clear. Humeral head maintains its articulation with the glenoid, there is moderate to marked degenerative change. Markedly comminuted fracture involving the neck and proximal shaft of the humerus with multiple displaced bone fragments. A bowel 1/2 shaft diameter of displacement of the distal fracture fragment toward the midline with mild varus angulation. IMPRESSION: Markedly comminuted, and slightly displaced and angulated fracture involving the left humeral neck and proximal shaft. No dislocation of the humeral head. Electronically Signed   By: Donavan Foil M.D.   On: 06/24/2017 20:21   Dg Humerus Left  Result Date: 06/30/2017 CLINICAL DATA:  ORIF of proximal left humeral fracture EXAM: LEFT HUMERUS - 2+ VIEW; DG C-ARM 61-120 MIN COMPARISON:  None. FLUOROSCOPY TIME:  Fluoroscopy Time:  86 seconds Radiation Exposure Index (if provided by the fluoroscopic device): Not available Number of Acquired Spot Images: 2 FINDINGS: Fixation sideplate is noted along the proximal left humerus with multiple fixation screws identified. Fracture fragments are in near anatomic alignment. IMPRESSION: ORIF of proximal left humeral fracture Electronically Signed   By: Inez Catalina M.D.   On: 06/30/2017 16:17   Dg C-arm 61-120 Min  Result Date: 06/30/2017 CLINICAL DATA:  ORIF of proximal  left humeral fracture EXAM: LEFT HUMERUS - 2+ VIEW; DG C-ARM 61-120 MIN COMPARISON:  None. FLUOROSCOPY TIME:  Fluoroscopy Time:  86 seconds Radiation Exposure Index (if provided by the fluoroscopic device): Not available Number of Acquired Spot Images: 2 FINDINGS: Fixation sideplate is noted along the proximal left humerus with multiple fixation screws identified. Fracture fragments are in near anatomic alignment. IMPRESSION: ORIF of proximal left humeral  fracture Electronically Signed   By: Inez Catalina M.D.   On: 06/30/2017 16:17     Results/Tests Pending at Time of Discharge: None  Discharge Medications:  Allergies as of 07/03/2017      Reactions   No Known Allergies       Medication List    STOP taking these medications   colchicine 0.6 MG tablet Commonly known as:  COLCRYS   omeprazole 40 MG capsule Commonly known as:  PRILOSEC   spironolactone 25 MG tablet Commonly known as:  ALDACTONE     TAKE these medications   allopurinol 100 MG tablet Commonly known as:  ZYLOPRIM TAKE ONE TABLET BY MOUTH ONCE DAILY   folic acid 1 MG tablet Commonly known as:  FOLVITE TAKE ONE TABLET BY MOUTH DAILY   furosemide 20 MG tablet Commonly known as:  LASIX TAKE 1 TABLET BY MOUTH ONCE DAILY   gabapentin 100 MG capsule Commonly known as:  NEURONTIN Take 1 capsule (100 mg total) by mouth 3 (three) times daily.   IRON PO Take 1 tablet by mouth daily.   magnesium oxide 400 (241.3 Mg) MG tablet Commonly known as:  MAG-OX Take 2 tablets (800 mg total) by mouth 2 (two) times daily.   oxyCODONE 5 MG immediate release tablet Commonly known as:  Oxy IR/ROXICODONE Take 1 tablet (5 mg total) by mouth every 8 (eight) hours as needed for breakthrough pain.   propranolol 40 MG tablet Commonly known as:  INDERAL TAKE ONE TABLET BY MOUTH TWICE DAILY   thiamine 100 MG tablet Commonly known as:  VITAMIN B-1 Take 100 mg by mouth daily.       Discharge Instructions: Please refer to Patient Instructions section of EMR for full details.  Patient was counseled important signs and symptoms that should prompt return to medical care, changes in medications, dietary instructions, activity restrictions, and follow up appointments.   Follow-Up Appointments: Follow-up Information    Marjie Skiff, MD Follow up.   Specialty:  Family Medicine Contact information: [place] [address] [phone]          Caroline More, [place] 07/03/2017, 2:24 PM PGY-1, [place]

## 2017-06-25 NOTE — Progress Notes (Signed)
  FPTS Interim Progress Note  S: More awake this morning. Denies pain. Denies blood stools, vomiting blood or coffee ground emesis.   O: BP 109/72   Pulse 100   Temp 98.2 F (36.8 C) (Oral)   Resp 18   Ht 5\' 11"  (1.803 m)   Wt 185 lb (83.9 kg)   SpO2 100%   BMI 25.80 kg/m   Gen:  Heart: tachycardic, regular rhythm, no MRG Lungs: CTA bilaterally, no increased work of breathing Abdomen: soft, distended, non-tender, +BS Extremities: +1 edema to ankles bilaterally  Neuro: no focal deficits  A/P: Anemia- s/p 2 units PRBC, awaiting post-transfusion h/h  Left humeral fx- awaiting ortho recommendations -25 mcg fentanyl q2H prn for pain -narcan PRN  AKI- continue IVF  Hyponatremia- continue IVF  Hypokalemia- K 3.3 on admission -k-dur 6meq BID -am CMP  Hypomagnesemia- mag 1 on admission, s/p 2 gram IV mag -recheck this am, replete as needed  Alcohol abuse/cirrhosis- monitor on CIWA -Daughter Fayrene Helper [phone]) is health care POA will notify her of admission  Steve Rattler, DO 06/25/2017, 8:57 AM PGY-1, [place] [facility] pager [phone]

## 2017-06-25 NOTE — ED Notes (Signed)
Delay in blood draw ortho tech at bedside.

## 2017-06-25 NOTE — Progress Notes (Signed)
Orthopedic Tech Progress Note Patient Details:  William [name] 01-23-1961 [id number]  Ortho Devices Type of Ortho Device: Arm sling, Coapt Ortho Device/Splint Location: lue Ortho Device/Splint Interventions: Ordered, Application, Adjustment   Karolee Stamps 06/25/2017, 1:01 AM

## 2017-06-25 NOTE — Evaluation (Signed)
Physical Therapy Evaluation Patient Details Name: William [name] MRN: [id number] DOB: August 02, 1961 Today's Date: 06/25/2017   History of Present Illness  Pt is a 56 yo m ale admitted through ED on 06/24/17 following an alcohol related fall resulting in a left humeral fracture. Pt was diagnoised with symptomatic anemia. PMH significant for esophogeal varices, chronic anemia, alcholic cirrhosis, polydonal elevation, HTN, CKD3.   Clinical Impression  Pt presents with the above diagnosis and below deficits for therapy evaluation. Prior to admission, pt lived alone in a single level home with stairs leading in. Pt was completely independent and doing for himself. This session, pt requires Mod to Max A for all mobility due to his inability to use LUE. Pt continues to await an ortho consult for left shoulder fx. Pt was positioned in chair with chair alarm under him and family present. Pt will benefit from continued acute PT services in order to address the below deficits prior to DC. Pt requires SNF at discharge.     Follow Up Recommendations SNF;Supervision/Assistance - 24 hour    Equipment Recommendations  None recommended by PT    Recommendations for Other Services       Precautions / Restrictions Precautions Precautions: Fall;Shoulder Type of Shoulder Precautions: NWB Shoulder Interventions: Shoulder sling/immobilizer Precaution Booklet Issued: No Precaution Comments: NWB LUE, awaiting ortho consult Required Braces or Orthoses: Sling Restrictions Weight Bearing Restrictions: Yes LUE Weight Bearing: Non weight bearing      Mobility  Bed Mobility Overal bed mobility: Needs Assistance Bed Mobility: Supine to Sit     Supine to sit: Mod assist;+2 for physical assistance     General bed mobility comments: +2 assist with LE's and trunk to EOB. Pt initiates movement, but requires assist to complete movement.   Transfers Overall transfer level: Needs assistance Equipment used: 2 person  hand held assist Transfers: Sit to/from [facility] Sit to Stand: Mod assist;+2 physical assistance Stand pivot transfers: Mod assist;+2 physical assistance       General transfer comment: Mod A x2 for safety to move from EOB to recliner  Ambulation/Gait         Gait velocity: Did not attempt this session      Stairs            Wheelchair Mobility    Modified Rankin (Stroke Patients Only)       Balance Overall balance assessment: Needs assistance Sitting-balance support: Single extremity supported;Feet supported Sitting balance-Leahy Scale: Fair     Standing balance support: Bilateral upper extremity supported Standing balance-Leahy Scale: Poor                               Pertinent Vitals/Pain Pain Assessment: Faces Faces Pain Scale: Hurts even more Pain Location: LUE with  mobility Pain Descriptors / Indicators: Grimacing;Guarding Pain Intervention(s): Monitored during session;Premedicated before session;Repositioned    Home Living Family/patient expects to be discharged to:: Private residence Living Arrangements: Alone Available Help at Discharge: Friend(s);Available PRN/intermittently Type of Home: Apartment Home Access: Stairs to enter Entrance Stairs-Rails: Right;Left;Can reach both Entrance Stairs-Number of Steps: 5 Home Layout: One level Home Equipment: Cane - single point;Bedside commode;Walker - 2 wheels      Prior Function Level of Independence: Independent         Comments: Reports he did have to use a cane to ambulate into the hospital due to feeling weak, but normally doesn't     Hand Dominance  Dominant Hand: Left    Extremity/Trunk Assessment   Upper Extremity Assessment Upper Extremity Assessment: Defer to OT evaluation    Lower Extremity Assessment Lower Extremity Assessment: Generalized weakness    Cervical / Trunk Assessment Cervical / Trunk Assessment: Kyphotic  Communication    Communication: No difficulties  Cognition Arousal/Alertness: Awake/alert Behavior During Therapy: WFL for tasks assessed/performed Overall Cognitive Status: Within Functional Limits for tasks assessed                                        General Comments      Exercises     Assessment/Plan    PT Assessment Patient needs continued PT services  PT Problem List Decreased strength;Decreased activity tolerance;Decreased balance;Decreased mobility;Decreased knowledge of use of DME;Pain       PT Treatment Interventions DME instruction;Gait training;Therapeutic activities;Stair training;Functional mobility training;Therapeutic exercise;Balance training    PT Goals (Current goals can be found in the Care Plan section)  Acute Rehab PT Goals Patient Stated Goal: none stated PT Goal Formulation: With patient Time For Goal Achievement: 07/09/17 Potential to Achieve Goals: Good    Frequency Min 2X/week   Barriers to discharge        Co-evaluation               AM-PAC PT "6 Clicks" Daily Activity  Outcome Measure Difficulty turning over in bed (including adjusting bedclothes, sheets and blankets)?: Total Difficulty moving from lying on back to sitting on the side of the bed? : Total Difficulty sitting down on and standing up from a chair with arms (e.g., wheelchair, bedside commode, etc,.)?: Total Help needed moving to and from a bed to chair (including a wheelchair)?: Total Help needed walking in hospital room?: Total Help needed climbing 3-5 steps with a railing? : Total 6 Click Score: 6    End of Session Equipment Utilized During Treatment: Gait belt Activity Tolerance: Patient limited by fatigue Patient left: in chair;with call bell/phone within reach;with chair alarm set;with family/visitor present Nurse Communication: Mobility status PT Visit Diagnosis: Unsteadiness on feet (R26.81);Muscle weakness (generalized) (M62.81);Difficulty in walking, not  elsewhere classified (R26.2)    Time: 9470-9628 PT Time Calculation (min) (ACUTE ONLY): 19 min   Charges:   PT Evaluation $PT Eval Moderate Complexity: 1 Procedure     PT G Codes:        Scheryl Marten PT, DPT  [phone]   Jacqulyn Liner Sloan Leiter 06/25/2017, 3:12 PM

## 2017-06-25 NOTE — Progress Notes (Signed)
PT Cancellation Note  Patient Details Name: RAYYAN BURLEY MRN: [id number] DOB: 1961/01/25   Cancelled Treatment:    Reason Eval/Treat Not Completed: Patient declined, no reason specified. Pt family is present and pt requests PT return later. Tried x2 this AM, first time patient had breakfast. Will check back later as time allows.    Scheryl Marten PT, DPT  [phone]  06/25/2017, 12:24 PM

## 2017-06-25 NOTE — H&P (Signed)
[name] Admission History and Physical Service Pager: [phone]  Patient name: William [name] Medical record number: [id number] Date of birth: 1961-05-05 Age: 56 y.o. Gender: male  Primary Care Provider: Marjie Skiff, MD Consultants: orthopedics Code Status: full per last admission, unable to get meaningful code discussion at time of admission   Chief Complaint: left shoulder pain  Assessment and Plan: William [name] is a 55 y.o. male presenting with symptomatic anemia and left humeral fx after fall. PMH is significant for chronic anemia, alcoholic cirrhosis, esophageal varices, polyclonal elevation of gamma globulins, HTN, and CKD3.  #Symptomatic anemia, pancytopenia: Recently admitted with the same where patient had full work up including bone marrow biopsy, which was negative. Bleeding thought to be secondary to liver dysfunction from alcohol abuse. Hematology consulted during last admission as well as GI. Patient with known esophageal varices, most recent EGD Sept 2017. Patient tachycardic to 129 in ED. Hemoglobin 4.1, baseline appears to be 8. ED provider ordered 2 units PRBC. FOBT negative in ED. All cell lines down, platelets low at 44. Denies any acute symptoms of bleeding. -Admit to inpatient, Dr. Erin Hearing attending -monitor on telemetry -vitals per floor -up with assistance -Transfuse 2 units packed red blood cells,post-transfusion H/H per RN draw 2 hours after transfusion -Continue maintenance fluids overnight with normal saline -IV protonix -get fractionated bili with AM CBC, CMP, mag, phos, pt/INR, and ammonia level -consider GI consult in AM, although any intervention unlikely as patient is not actively bleeding -consult PT/OT for falls, weakness  #Left Humeral fx: fell on Thursday night, comminuted fx involving neck and proximal shaft of humerus with multiple displaced bone fragments, displacement of distal fx toward midline with  mild varus angulation. Patient received 1 mg dilaudid in ED for pain and then 1 mg Ativan for concern for withdrawal. He was extremely somnolent on my exam but aroused more with blood draw. No narcan given. -ortho consulted in ED -tech applied splint -appreciate Ortho recs, anticipate patient would need surgical correction -use Fentanyl sparingly prn for pain  #AKI: CKD3, Creatinine 1.81, baseline Cr appears to be ~1.3. -Hydrate as above -am CMP   #Hyponatremia: Na 128, history of chronic hyponatremia likely due to poor by mouth intake secondary to alcoholism.  Baseline Na 130-134 -CMP in am  -IVFs as above  #Hypomagnesemia: Mg 1.0. -replete as needed -Will monitor Mg and P closely for possible refeeding syndrome given EtOH abuse  #Alcoholic cirrhosis with esophageal varices: AST elevated to 141, ALT 35 consistent with alcoholic cirrhosis. Total bilirubin elevated to 7.9. EtOH <5 on admission. Patient stated last drink was Thursday night when fall occurred. On propranolol for prophylaxis of variceal hemorrhage, also on spironolactone.  -monitor on CIWA, ativan as needed for withdrawal -Hold home meds in the setting of soft BP, dehydration, AKI -CMP, INR in am -check mag in AM -Continue propranolol at a reduced dose, BP soft on admission -Folic acid, MVI, Thiamine -Replete Mg as needed - encourage alcohol cessation  FEN/GI: : NPO, IV fluids   Prophylaxis: SCD's, platelets 44  Disposition: admit to telemetry  History of Present Illness:  William [name] is a 56 y.o. male presenting with anemia and left arm fracture after fall.  Patient fell on Thursday. He states he was drinking. He cannot stay awake long enough to answer my questions. Arouses with calling his name and shaking him.   States he lives alone. Denies blood in stool, denies vomiting up blood.   Asked ED  nurse to give narcan due to somnolence, however went back to assess patient and labs were being drawn; he was more  awake and interactive. No narcan given.   Review Of Systems: Per HPI with the following additions:  Review of Systems  Constitutional: Positive for malaise/fatigue.  Respiratory: Negative for shortness of breath.   Cardiovascular: Negative for chest pain and palpitations.  Gastrointestinal: Negative for blood in stool, melena, nausea and vomiting.  Musculoskeletal: Positive for falls and joint pain.  Neurological: Positive for weakness. Negative for focal weakness.  Psychiatric/Behavioral: Positive for substance abuse.    Patient Active Problem List   Diagnosis Date Noted  . Leg swelling 05/29/2017  . Hypothyroidism 05/29/2017  . Tongue ulcer   . Hyponatremia   . Acute kidney injury (nontraumatic) ([place])   . Anemia 05/17/2017  . Dark urine 09/16/2016  . Diarrhea   . Colon wall thickening   . Anemia due to bone marrow failure ([place])   . Malnutrition of moderate degree 09/10/2016  . Unintentional weight loss   . Dehydration   . Hypomagnesemia   . Hypokalemia   . Hypocalcemia   . Vomiting, persistent, in adult   . Intractable hiccups   . Decreased appetite 09/09/2016  . Symptomatic anemia 09/09/2016  . Chronic pain 02/08/2016  . Pancytopenia ([place]) 05/29/2015  . History of esophageal varices 08/20/2012  . Shoulder pain, right 06/27/2012  . Gout 06/27/2012  . MGUS (monoclonal gammopathy of unknown significance) 06/15/2012  . Hypertension 02/28/2012  . Cervical pain (neck) 01/16/2012  . Alcohol abuse 01/16/2012  . Chronic kidney disease (CKD), stage III (moderate) 12/13/2011  . Erectile dysfunction 12/13/2011  . Back pain 08/11/2011  . Shoulder pain, left 02/07/2011  . Knee pain 02/07/2011  . Cirrhosis ([place]) 08/04/2010  . Alcoholic cirrhosis of liver ([place]) 02/23/2010    Past Medical History: Past Medical History:  Diagnosis Date  . Anemia   . CHF (congestive heart failure) ([place])   . Chronic kidney disease   . Cirrhosis of liver ([place])   . COMPRESSION FRACTURE,  LUMBAR VERTEBRAE 07/29/2010   Qualifier: Diagnosis of  By: Anabel Bene    . Hypertension   . Prostate cancer [facility])     Past Surgical History: Past Surgical History:  Procedure Laterality Date  . ACHILLES TENDON REPAIR    . ESOPHAGOGASTRODUODENOSCOPY (EGD) WITH PROPOFOL N/A 09/13/2016   Procedure: ESOPHAGOGASTRODUODENOSCOPY (EGD) WITH PROPOFOL;  Surgeon: Wonda Horner, MD;  Location: [facility] ENDOSCOPY;  Service: Endoscopy;  Laterality: N/A;    Social History: Social History  Substance Use Topics  . Smoking status: Never Smoker  . Smokeless tobacco: Never Used  . Alcohol use Yes     Comment: 1 pint per week   Additional social history: lives alone  Please also refer to relevant sections of EMR.  Family History: Family History  Problem Relation Age of Onset  . Kidney disease Mother   . Arthritis Mother   . Hypertension Father    Allergies and Medications: No Known Allergies No current facility-administered medications on file prior to encounter.    Current Outpatient Prescriptions on File Prior to Encounter  Medication Sig Dispense Refill  . allopurinol (ZYLOPRIM) 100 MG tablet TAKE ONE TABLET BY MOUTH ONCE DAILY 30 tablet 11  . folic acid (FOLVITE) 1 MG tablet TAKE ONE TABLET BY MOUTH DAILY 90 tablet 3  . gabapentin (NEURONTIN) 100 MG capsule Take 1 capsule (100 mg total) by mouth 3 (three) times daily. 90 capsule 5  .  IRON PO Take 1 tablet by mouth daily.    . propranolol (INDERAL) 40 MG tablet TAKE ONE TABLET BY MOUTH TWICE DAILY 60 tablet 11  . spironolactone (ALDACTONE) 25 MG tablet Take 2 tablets (50 mg total) by mouth daily. 60 tablet 0  . thiamine (VITAMIN B-1) 100 MG tablet Take 100 mg by mouth daily.    . colchicine (COLCRYS) 0.6 MG tablet Take 0.5 tablets (0.3 mg total) by mouth daily. (Patient not taking: Reported on 05/29/2017) 30 tablet 5  . furosemide (LASIX) 20 MG tablet TAKE 1 TABLET BY MOUTH ONCE DAILY (Patient not taking: Reported on 06/24/2017) 30 tablet 11   . omeprazole (PRILOSEC) 40 MG capsule TAKE ONE CAPSULE BY MOUTH ONCE DAILY (Patient not taking: Reported on 05/29/2017) 30 capsule 11    Objective: BP (!) 102/54 (BP Location: Right Arm)   Pulse (!) 115   Temp 98.5 F (36.9 C) (Oral)   Resp 12   Ht 5' 11"  (1.803 m)   Wt 185 lb (83.9 kg)   SpO2 98%   BMI 25.80 kg/m  Exam: General: jaundiced man laying in bed sleeping, arouses to being shaken lightly and name being called Eyes: jaundiced, pupils miotic, round, reactive to light, sclera icteric ENTM: dry mucous membranes and lips Neck: supple, no LAD Cardiovascular: tachycardic, regular rhythm. No murmurs rubs gallops. +2 radial pulses bilaterally  Respiratory: CTAB no increased WOB Gastrointestinal: soft, non-distended, non-tender, no fluid wave. +BS MSK: left arm in sling. +1 edema to ankles bilaterally  Derm: no rashes or lesions noted. Significant for jaundice  Neuro: somnolent, follows commands. Alert to person and place. No focal deficits. Sensation in tact. Strength 4/5 in LE bilaterally, 5/5 in upper extremities bilaterally  Psych: unable to assess  Labs and Imaging: CBC BMET   Recent Labs Lab 06/24/17 2336  WBC 2.2*  HGB 4.1*  HCT 12.2*  PLT 44*    Recent Labs Lab 06/24/17 2035  NA 128*  K 3.3*  CL 97*  CO2 20*  BUN 12  CREATININE 1.81*  GLUCOSE 107*  CALCIUM 7.8*     Dg Shoulder Left  Result Date: 06/24/2017 CLINICAL DATA:  Fell onto shoulder with pain EXAM: LEFT SHOULDER - 2+ VIEW COMPARISON:  None. FINDINGS: Left AC joint is intact. The left lung apex is clear. Humeral head maintains its articulation with the glenoid, there is moderate to marked degenerative change. Markedly comminuted fracture involving the neck and proximal shaft of the humerus with multiple displaced bone fragments. A bowel 1/2 shaft diameter of displacement of the distal fracture fragment toward the midline with mild varus angulation. IMPRESSION: Markedly comminuted, and slightly  displaced and angulated fracture involving the left humeral neck and proximal shaft. No dislocation of the humeral head. Electronically Signed   By: Donavan Foil M.D.   On: 06/24/2017 20:21   Steve Rattler, DO 06/25/2017, 2:10 AM PGY-1, [place] Intern pager: [phone], text pages welcome  I have seen and evaluated the patient with Dr. Dossie Der. I am in agreement with the note above in its revised form. My additions are in red.  Wendee Beavers, MD, PGY-2 06/25/2017 10:15 AM

## 2017-06-26 ENCOUNTER — Inpatient Hospital Stay (HOSPITAL_COMMUNITY): Payer: Medicare PPO

## 2017-06-26 DIAGNOSIS — D61818 Other pancytopenia: Principal | ICD-10-CM

## 2017-06-26 DIAGNOSIS — E871 Hypo-osmolality and hyponatremia: Secondary | ICD-10-CM

## 2017-06-26 DIAGNOSIS — N179 Acute kidney failure, unspecified: Secondary | ICD-10-CM

## 2017-06-26 DIAGNOSIS — K7030 Alcoholic cirrhosis of liver without ascites: Secondary | ICD-10-CM

## 2017-06-26 LAB — MAGNESIUM: Magnesium: 1.6 mg/dL — ABNORMAL LOW (ref 1.7–2.4)

## 2017-06-26 LAB — CBC
HEMATOCRIT: 22.4 % — AB (ref 39.0–52.0)
Hemoglobin: 7.7 g/dL — ABNORMAL LOW (ref 13.0–17.0)
MCH: 30.4 pg (ref 26.0–34.0)
MCHC: 34.4 g/dL (ref 30.0–36.0)
MCV: 88.5 fL (ref 78.0–100.0)
PLATELETS: 50 10*3/uL — AB (ref 150–400)
RBC: 2.53 MIL/uL — AB (ref 4.22–5.81)
RDW: 18.5 % — ABNORMAL HIGH (ref 11.5–15.5)
WBC: 1.9 10*3/uL — AB (ref 4.0–10.5)

## 2017-06-26 LAB — PHOSPHORUS: Phosphorus: 1.2 mg/dL — ABNORMAL LOW (ref 2.5–4.6)

## 2017-06-26 LAB — HEMOGLOBIN AND HEMATOCRIT, BLOOD
HEMATOCRIT: 21.2 % — AB (ref 39.0–52.0)
Hemoglobin: 7.3 g/dL — ABNORMAL LOW (ref 13.0–17.0)

## 2017-06-26 NOTE — Progress Notes (Signed)
Family Medicine Teaching Service Daily Progress Note Intern Pager: [phone]  Patient name: William [name] Medical record number: [id number] Date of birth: March 05, 1961 Age: 56 y.o. Gender: male  Primary Care Provider: Marjie Skiff, MD Consultants: PT, Orthopedics   Code Status: Full   Pt Overview and Major Events to Date:  Admitted to [facility] on 7/1  Assessment and Plan:  1. Symptomatic anemia, pancytopenia Recently admitted with the same where patient had full work up including bone marrow biopsy, which was negative. Current HR 88. Patient received 2 units packed red blood cells at 1:25 am and 4:15 am, with current Hemoglobin of 7.3 and Hct 21.2. Denies any acute symptoms of bleeding. Denies blood in stool or coffee ground emesis. FOBT negative in ED. Patient states he feels better but is still slightly weak.  -repeat H/H @ 5pm, monitor closely  -look for source of bleeding if Hemoglobin and Hct drop.  -continue maintenance fluids with normal saline  -continue Protonix  2. Left Humeral fracture [place] on Thursday night, comminuted fx involving neck and proximal shaft of humerus with multiple displaced bone fragments, displacement of distal fx toward midline with mild varus angulation. Splint applied by ortho tech. Patient currently states pain at a level of 5/10.  -consult ortho -appreciate ortho recommendatins  -25 mcg fentanyl IV prn for severe pain   3. AKI CKD3. Cr 1.50. Baseline appears to be ~1.3.  -Continue maintenace fluids  -Monitor CMP  4. Hyponatremia Na currently 130. Na was 128 on admission. Likely due to poor intake 2/2 alcoholism.  -Monitor CMP -Continue maintenance fluids   5. Hypokalemia - resolved K currently 4.0. K of 3.3 on admission   6. Hypomagnesemia  Mg currently 1.6. Mg on admission 1.0.  -continue to monitor   7. Alcoholic cirrhosis with esophageal varices  AST 120. AST 141 on admission. ALT 31. ALT 35 on admission. Total bili 9.4. Total bili 7.9  on admission. EtOH <5 on admission. Patient states last drink was on Thursday. Patient denies tremors, anxiety, or agitation.  -monitor on CIWA   FEN/GI: Protonix  PPx: SCD  Disposition: Patient is improving   Subjective:  Mr. William [name] is a 56 yo male presenting for anemia and fx of left humerus following a fall on Thursday. Patient currently states he is doing better but is still slightly weak. Patient rates pain in left arm as a 5/10 in severity. Patient denies tremors, anxiety, or agitation.   Objective: Temp:  [97.1 F (36.2 C)-100.4 F (38 C)] 97.1 F (36.2 C) (07/02 1000) Pulse Rate:  [107-118] 107 (07/02 0558) Resp:  [18-21] 20 (07/02 0558) BP: (97-110)/(61-76) 110/76 (07/02 1000) SpO2:  [98 %-100 %] 100 % (07/02 1000) Physical Exam: General: NAD. Awake. Slightly jaundiced.  Eyes: scleral icterus Cardiovascular: RRR. No murmurs, rubs, or gallops. 2+ pulses bilaterally on upper extremities.  Respiratory: CTAB. No increased WOB Abdomen: Soft, non tender, non distended. +BS in all four quadrants  Extremities: left arm in splint.   Laboratory:  Recent Labs Lab 06/24/17 2336 06/25/17 1339 06/26/17 0623  WBC 2.2* 2.5*  --   HGB 4.1* 5.9* 7.3*  HCT 12.2* 17.0* 21.2*  PLT 44* 49*  --     Recent Labs Lab 06/24/17 2035 06/25/17 1143  NA 128* 130*  K 3.3* 4.0  CL 97* 104  CO2 20* 17*  BUN 12 16  CREATININE 1.81* 1.50*  CALCIUM 7.8* 7.4*  PROT 6.5 6.2*  BILITOT 7.9* 9.4*  ALKPHOS 108 98  ALT 35  31  AST 141* 120*  GLUCOSE 107* 135*     Imaging/Diagnostic Tests: DG Shoulder Left CLINICAL DATA:  Fell onto shoulder with pain  EXAM: LEFT SHOULDER - 2+ VIEW  COMPARISON:  None.  FINDINGS: Left AC joint is intact. The left lung apex is clear. Humeral head maintains its articulation with the glenoid, there is moderate to marked degenerative change.  Markedly comminuted fracture involving the neck and proximal shaft of the humerus with multiple displaced  bone fragments. A bowel 1/2 shaft diameter of displacement of the distal fracture fragment toward the midline with mild varus angulation.  IMPRESSION: Markedly comminuted, and slightly displaced and angulated fracture involving the left humeral neck and proximal shaft. No dislocation of the humeral head.   Electronically Signed   By: Donavan Foil M.D.   On: 06/24/2017 20:21  Caroline More, DO 06/26/2017, 1:51 PM PGY-1, [place] Intern pager: [phone], text pages welcome

## 2017-06-26 NOTE — Evaluation (Signed)
Occupational Therapy Evaluation Patient Details Name: William [name] MRN: [id number] DOB: 03-Jan-1961 Today's Date: 06/26/2017    History of Present Illness Pt is a 56 yo m ale admitted through ED on 06/24/17 following an alcohol related fall resulting in a left humeral fracture. Pt was diagnoised with symptomatic anemia. PMH significant for esophogeal varices, chronic anemia, alcholic cirrhosis, polydonal elevation, HTN, CKD3.    Clinical Impression   Pt admitted with above. He demonstrates the below listed deficits and will benefit from continued OT to maximize safety and independence with BADLs.  Pt presents to OT with generalized weakness, decreased functional use of Lt. UE, pain, decreased activity tolerance, impaired balance.  He currently requires max A, overall with ADLs and mod A +2 for functional transfers.  He lived alone PTA, and was fully independent.  He will likely need a significant amount of assist at discharge - recommend SNF level rehab.  Will follow acutely.        Follow Up Recommendations  SNF    Equipment Recommendations  None recommended by OT    Recommendations for Other Services       Precautions / Restrictions Precautions Precautions: Fall;Shoulder Type of Shoulder Precautions: NWB Shoulder Interventions: Shoulder sling/immobilizer Precaution Booklet Issued: No Precaution Comments: NWB LUE, awaiting ortho consult Required Braces or Orthoses: Sling Restrictions Weight Bearing Restrictions: Yes LUE Weight Bearing: Non weight bearing      Mobility Bed Mobility Overal bed mobility: Needs Assistance Bed Mobility: Sit to Supine       Sit to supine: Mod assist;+2 for physical assistance   General bed mobility comments: assist to lower trunk and to lift LEs onto bed   Transfers Overall transfer level: Needs assistance Equipment used: Ambulation equipment used Transfers: Sit to/from [employer] Transfers Sit to Stand: Mod assist;+2 physical  assistance Stand pivot transfers: Mod assist;+2 safety/equipment       General transfer comment: asssit to power up into standing and assist for balance     Balance Overall balance assessment: Needs assistance Sitting-balance support: No upper extremity supported;Feet supported Sitting balance-Leahy Scale: Fair     Standing balance support: Single extremity supported Standing balance-Leahy Scale: Poor Standing balance comment: requires min - mod A for standing and UE support                            ADL either performed or assessed with clinical judgement   ADL Overall ADL's : Needs assistance/impaired Eating/Feeding: Bed level;Sitting;Modified independent   Grooming: Wash/dry hands;Wash/dry face;Oral care;Brushing hair;Minimal assistance;Sitting;Bed level   Upper Body Bathing: Moderate assistance;Sitting;Bed level   Lower Body Bathing: Maximal assistance;Sit to/from stand   Upper Body Dressing : Maximal assistance;Sitting   Lower Body Dressing: Total assistance;Sit to/from stand   Toilet Transfer: Moderate assistance;+2 for physical assistance;BSC (stedy )   Toileting- Clothing Manipulation and Hygiene: Total assistance;Sit to/from stand       Functional mobility during ADLs: Moderate assistance;+2 for physical assistance General ADL Comments: Pt requires assist due to impaired balance, assist for sit to stand and decreased functional use of Lt. UE      Vision         Perception     Praxis      Pertinent Vitals/Pain Pain Assessment: Faces Faces Pain Scale: Hurts even more Pain Location: back, buttocks and Lt UE  Pain Descriptors / Indicators: Aching;Grimacing;Guarding Pain Intervention(s): Monitored during session;Repositioned     Hand Dominance Left   Extremity/Trunk  Assessment Upper Extremity Assessment Upper Extremity Assessment: Generalized weakness;LUE deficits/detail LUE Deficits / Details: mod edema noted.  Lt UE immobilized     Lower Extremity Assessment Lower Extremity Assessment: Defer to PT evaluation   Cervical / Trunk Assessment Cervical / Trunk Assessment: Kyphotic   Communication Communication Communication: No difficulties   Cognition Arousal/Alertness: Awake/alert Behavior During Therapy: WFL for tasks assessed/performed Overall Cognitive Status: Within Functional Limits for tasks assessed                                     General Comments  Pt requires reinforcement for NWB Lt UE     Exercises     Shoulder Instructions      Home Living Family/patient expects to be discharged to:: Skilled nursing facility Living Arrangements: Alone Available Help at Discharge: Friend(s);Available PRN/intermittently Type of Home: Apartment Home Access: Stairs to enter Entrance Stairs-Number of Steps: 5 Entrance Stairs-Rails: Right;Left;Can reach both Home Layout: One level     Bathroom Shower/Tub: Corporate investment banker: Standard Bathroom Accessibility: Yes How Accessible: Accessible via walker Home Equipment: Cane - single point;Bedside commode;Walker - 2 wheels          Prior Functioning/Environment Level of Independence: Independent        Comments: Reports he did have to use a cane to ambulate into the hospital due to feeling weak, but normally doesn't        OT Problem List: Decreased strength;Decreased range of motion;Decreased activity tolerance;Impaired balance (sitting and/or standing);Decreased coordination;Decreased safety awareness;Decreased knowledge of use of DME or AE;Decreased knowledge of precautions;Impaired UE functional use;Pain      OT Treatment/Interventions: Self-care/ADL training;Therapeutic exercise;DME and/or AE instruction;Therapeutic activities;Patient/family education;Balance training    OT Goals(Current goals can be found in the care plan section) Acute Rehab OT Goals Patient Stated Goal: to get stronger  OT Goal  Formulation: With patient Time For Goal Achievement: 07/10/17 Potential to Achieve Goals: Good ADL Goals Pt Will Perform Grooming: with min assist;standing Pt Will Perform Upper Body Bathing: with min assist;with adaptive equipment;sitting Pt Will Perform Lower Body Bathing: with min assist;with adaptive equipment;sit to/from stand Pt Will Transfer to Toilet: with min assist;ambulating;regular height toilet;bedside commode;grab bars Pt Will Perform Toileting - Clothing Manipulation and hygiene: with min assist;sit to/from stand  OT Frequency: Min 2X/week   Barriers to D/C: Decreased caregiver support          Co-evaluation              AM-PAC PT "6 Clicks" Daily Activity     Outcome Measure Help from another person eating meals?: A Little Help from another person taking care of personal grooming?: A Little Help from another person toileting, which includes using toliet, bedpan, or urinal?: A Lot Help from another person bathing (including washing, rinsing, drying)?: A Lot Help from another person to put on and taking off regular upper body clothing?: A Lot Help from another person to put on and taking off regular lower body clothing?: A Lot 6 Click Score: 14   End of Session Equipment Utilized During Treatment: Gait belt;Other (comment) (stedy) Nurse Communication: Mobility status;Need for lift equipment  Activity Tolerance: Patient limited by pain;Patient limited by fatigue Patient left: in bed;with call bell/phone within reach;with bed alarm set;with nursing/sitter in room  OT Visit Diagnosis: Unsteadiness on feet (R26.81);Pain Pain - Right/Left: Left Pain - part of body: Arm  Time: 4320-0379 OT Time Calculation (min): 21 min Charges:  OT General Charges $OT Visit: 1 Procedure OT Evaluation $OT Eval Moderate Complexity: 1 Procedure G-Codes:     Lucille Passy, OTR/L [phone]   Lucille Passy M 06/26/2017, 5:13 PM

## 2017-06-26 NOTE — Progress Notes (Signed)
Nutrition Brief Note  Patient identified on the Malnutrition Screening Tool (MST) Report  Wt Readings from Last 15 Encounters:  06/24/17 185 lb (83.9 kg)  05/29/17 170 lb (77.1 kg)  05/25/17 181 lb 9.6 oz (82.4 kg)  05/17/17 162 lb (73.5 kg)  05/17/17 162 lb (73.5 kg)  10/13/16 168 lb 11.2 oz (76.5 kg)  09/16/16 175 lb (79.4 kg)  09/12/16 175 lb 8 oz (79.6 kg)  09/09/16 162 lb (73.5 kg)  02/08/16 173 lb 12.8 oz (78.8 kg)  05/29/15 169 lb 9 oz (76.9 kg)  01/06/15 168 lb (76.2 kg)  09/16/14 173 lb (78.5 kg)  06/09/14 170 lb (77.1 kg)  05/28/14 175 lb (79.4 kg)   William [name] is a 56 y.o. male presenting with symptomatic anemia and left humeral fx after fall. PMH is significant for chronic anemia, alcoholic cirrhosis, esophageal varices, polyclonal elevation of gamma globulins, HTN, and CKD3.  Body mass index is 25.8 kg/m. Patient meets criteria for overweight based on current BMI.   Current diet order is regular, patient is consuming approximately n/a% of meals at this time. Labs and medications reviewed.   No nutrition interventions warranted at this time. If nutrition issues arise, please consult RD.   Aleja Yearwood A. Jimmye Norman, RD, LDN, CDE Pager: [phone] After hours Pager: [phone]

## 2017-06-26 NOTE — NC FL2 (Signed)
[place] LEVEL OF CARE SCREENING TOOL     IDENTIFICATION  Patient Name: William [name] Birthdate: 1961/02/12 Sex: male Admission Date (Current Location): 06/24/2017  [facility] and [place] Number:  Herbalist and Address:  [facility], [place] [address], [place], [place] [address]      Provider Number: [id number]  Attending Physician Name and Address:  Lind Covert, MD  Relative Name and Phone Number:       Current Level of Care: Hospital Recommended Level of Care: [place] Prior Approval Number:    Date Approved/Denied:   PASRR Number: [id number]  Discharge Plan: SNF    Current Diagnoses: Patient Active Problem List   Diagnosis Date Noted  . Leg swelling 05/29/2017  . Hypothyroidism 05/29/2017  . Tongue ulcer   . Hyponatremia   . Acute kidney injury (nontraumatic) ([place])   . Anemia 05/17/2017  . Dark urine 09/16/2016  . Diarrhea   . Colon wall thickening   . Anemia due to bone marrow failure ([place])   . Malnutrition of moderate degree 09/10/2016  . Unintentional weight loss   . Dehydration   . Hypomagnesemia   . Hypokalemia   . Hypocalcemia   . Vomiting, persistent, in adult   . Intractable hiccups   . Decreased appetite 09/09/2016  . Symptomatic anemia 09/09/2016  . Chronic pain 02/08/2016  . Pancytopenia ([place]) 05/29/2015  . History of esophageal varices 08/20/2012  . Shoulder pain, right 06/27/2012  . Gout 06/27/2012  . MGUS (monoclonal gammopathy of unknown significance) 06/15/2012  . Hypertension 02/28/2012  . Cervical pain (neck) 01/16/2012  . Alcohol abuse 01/16/2012  . Chronic kidney disease (CKD), stage III (moderate) 12/13/2011  . Erectile dysfunction 12/13/2011  . Back pain 08/11/2011  . Shoulder pain, left 02/07/2011  . Knee pain 02/07/2011  . Cirrhosis ([place]) 08/04/2010  . Alcoholic cirrhosis of liver ([place]) 02/23/2010    Orientation RESPIRATION BLADDER Height & Weight      Self, Place, Situation  Normal Continent Weight: 83.9 kg (185 lb) Height:  5\' 11"  (180.3 cm)  BEHAVIORAL SYMPTOMS/MOOD NEUROLOGICAL BOWEL NUTRITION STATUS      Continent Diet (Please see DC Summary)  AMBULATORY STATUS COMMUNICATION OF NEEDS Skin   Limited Assist Verbally Normal                       Personal Care Assistance Level of Assistance  Bathing, Feeding, Dressing Bathing Assistance: Limited assistance Feeding assistance: Independent Dressing Assistance: Limited assistance     Functional Limitations Info             SPECIAL CARE FACTORS FREQUENCY  PT (By licensed PT)     PT Frequency: 5x/week              Contractures      Additional Factors Info  Code Status, Allergies Code Status Info: Full Allergies Info: NKA           Current Medications (06/26/2017):  This is the current hospital active medication list Current Facility-Administered Medications  Medication Dose Route Frequency Provider Last Rate Last Dose  . 0.9 %  sodium chloride infusion   Intravenous Continuous Steve Rattler, DO 75 mL/hr at 06/25/17 2109    . fentaNYL (SUBLIMAZE) injection 25 mcg  25 mcg Intravenous Q2H PRN Riccio, Angela C, DO      . LORazepam (ATIVAN) tablet 0.5 mg  0.5 mg Oral Q6H PRN Steve Rattler, DO  Or  . LORazepam (ATIVAN) injection 0.5 mg  0.5 mg Intravenous Q6H PRN Lucila Maine C, DO      . multivitamin with minerals tablet 1 tablet  1 tablet Oral Daily Riccio, Angela C, DO   1 tablet at 06/26/17 1041  . naloxone (NARCAN) injection 0.4 mg  0.4 mg Intravenous PRN Lucila Maine C, DO      . ondansetron (ZOFRAN) tablet 4 mg  4 mg Oral Q6H PRN Lucila Maine C, DO       Or  . ondansetron (ZOFRAN) injection 4 mg  4 mg Intravenous Q6H PRN Riccio, Angela C, DO      . pantoprazole (PROTONIX) injection 40 mg  40 mg Intravenous Q12H Riccio, Angela C, DO   40 mg at 06/26/17 1041  . potassium chloride (K-DUR,KLOR-CON) CR tablet 30 mEq  30 mEq Oral BID Lucila Maine C, DO   30 mEq at 06/26/17 1041  . senna-docusate (Senokot-S) tablet 1 tablet  1 tablet Oral QHS Riccio, Angela C, DO      . sodium chloride flush (NS) 0.9 % injection 3 mL  3 mL Intravenous Q12H Riccio, Angela C, DO   3 mL at 06/26/17 1051  . thiamine (VITAMIN B-1) tablet 100 mg  100 mg Oral Daily Carmin Muskrat, MD   100 mg at 06/26/17 1042     Discharge Medications: Please see discharge summary for a list of discharge medications.  Relevant Imaging Results:  Relevant Lab Results:   Additional Information SSN: [place], [place]

## 2017-06-26 NOTE — Progress Notes (Signed)
FPTS Interim Progress Note  S:William [name] is a 56 yo male presenting with anemia and fx of left humerus secondary to fall on Thursday.   O: BP 99/74 (BP Location: Right Arm)   Pulse 100   Temp 98.3 F (36.8 C) (Oral)   Resp 17   Ht 5\' 11"  (1.803 m)   Wt 185 lb (83.9 kg)   SpO2 100%   BMI 25.80 kg/m     A/P: Tremor in Right hand On exam this evening a slight tremor was noted in his right hand.  -Continue CIWA protocol  -Continue to monitor  Caroline More, DO 06/26/2017, 4:51 PM PGY-1, [place] Medicine Service pager [phone]

## 2017-06-27 ENCOUNTER — Inpatient Hospital Stay (HOSPITAL_COMMUNITY): Payer: Medicare PPO

## 2017-06-27 DIAGNOSIS — S42202A Unspecified fracture of upper end of left humerus, initial encounter for closed fracture: Secondary | ICD-10-CM

## 2017-06-27 LAB — BASIC METABOLIC PANEL
Anion gap: 6 (ref 5–15)
BUN: 15 mg/dL (ref 6–20)
CHLORIDE: 107 mmol/L (ref 101–111)
CO2: 17 mmol/L — ABNORMAL LOW (ref 22–32)
CREATININE: 1.19 mg/dL (ref 0.61–1.24)
Calcium: 7.2 mg/dL — ABNORMAL LOW (ref 8.9–10.3)
GFR calc non Af Amer: >60 mL/min (ref >60)
Glucose, Bld: 106 mg/dL — ABNORMAL HIGH (ref 65–99)
Potassium: 4.3 mmol/L (ref 3.5–5.1)
SODIUM: 130 mmol/L — AB (ref 135–145)

## 2017-06-27 LAB — CBC
HCT: 20.1 % — ABNORMAL LOW (ref 39.0–52.0)
HEMOGLOBIN: 6.9 g/dL — AB (ref 13.0–17.0)
MCH: 30.4 pg (ref 26.0–34.0)
MCHC: 34.3 g/dL (ref 30.0–36.0)
MCV: 88.5 fL (ref 78.0–100.0)
Platelets: 50 10*3/uL — ABNORMAL LOW (ref 150–400)
RBC: 2.27 MIL/uL — ABNORMAL LOW (ref 4.22–5.81)
RDW: 18.5 % — ABNORMAL HIGH (ref 11.5–15.5)
WBC: 1.5 10*3/uL — ABNORMAL LOW (ref 4.0–10.5)

## 2017-06-27 LAB — PREPARE RBC (CROSSMATCH)

## 2017-06-27 LAB — RETICULOCYTES
RBC.: 2.86 MIL/uL — ABNORMAL LOW (ref 4.22–5.81)
RETIC CT PCT: 1.9 % (ref 0.4–3.1)
Retic Count, Absolute: 54.3 10*3/uL (ref 19.0–186.0)

## 2017-06-27 LAB — HEMOGLOBIN AND HEMATOCRIT, BLOOD
HEMATOCRIT: 26.8 % — AB (ref 39.0–52.0)
Hemoglobin: 9 g/dL — ABNORMAL LOW (ref 13.0–17.0)

## 2017-06-27 LAB — MAGNESIUM: MAGNESIUM: 1.3 mg/dL — AB (ref 1.7–2.4)

## 2017-06-27 LAB — PHOSPHORUS: Phosphorus: 1.1 mg/dL — ABNORMAL LOW (ref 2.5–4.6)

## 2017-06-27 MED ORDER — SODIUM CHLORIDE 0.9 % IV SOLN
Freq: Once | INTRAVENOUS | Status: AC
  Administered 2017-06-28: via INTRAVENOUS

## 2017-06-27 MED ORDER — IOPAMIDOL (ISOVUE-300) INJECTION 61%
INTRAVENOUS | Status: AC
  Administered 2017-06-27: 30 mL
  Filled 2017-06-27: qty 30

## 2017-06-27 MED ORDER — SODIUM CHLORIDE 0.9 % IV SOLN
Freq: Once | INTRAVENOUS | Status: AC
  Administered 2017-06-27: 06:00:00 via INTRAVENOUS

## 2017-06-27 MED ORDER — K PHOS MONO-SOD PHOS DI & MONO 155-852-130 MG PO TABS
500.0000 mg | ORAL_TABLET | Freq: Every day | ORAL | Status: DC
  Administered 2017-06-27 – 2017-07-04 (×7): 500 mg via ORAL
  Filled 2017-06-27 (×8): qty 2

## 2017-06-27 MED ORDER — SODIUM PHOSPHATES 45 MMOLE/15ML IV SOLN
20.0000 mmol | Freq: Once | INTRAVENOUS | Status: AC
  Administered 2017-06-27: 20 mmol via INTRAVENOUS
  Filled 2017-06-27: qty 6.67

## 2017-06-27 MED ORDER — MAGNESIUM SULFATE IN D5W 1-5 GM/100ML-% IV SOLN
1.0000 g | Freq: Once | INTRAVENOUS | Status: AC
  Administered 2017-06-27: 1 g via INTRAVENOUS
  Filled 2017-06-27: qty 100

## 2017-06-27 MED ORDER — MAGNESIUM OXIDE 400 (241.3 MG) MG PO TABS
800.0000 mg | ORAL_TABLET | Freq: Every day | ORAL | Status: DC
  Administered 2017-06-27 – 2017-06-30 (×4): 800 mg via ORAL
  Filled 2017-06-27 (×5): qty 2

## 2017-06-27 MED ORDER — IOPAMIDOL (ISOVUE-300) INJECTION 61%
INTRAVENOUS | Status: AC
  Administered 2017-06-27: 100 mL
  Filled 2017-06-27: qty 100

## 2017-06-27 MED ORDER — VITAMIN K1 10 MG/ML IJ SOLN
10.0000 mg | Freq: Once | INTRAVENOUS | Status: AC
  Administered 2017-06-27: 10 mg via INTRAVENOUS
  Filled 2017-06-27: qty 1

## 2017-06-27 NOTE — Progress Notes (Signed)
MD notified of HBG 6.9 and sepsis alert. New orders for 2 units PRBCs received.

## 2017-06-27 NOTE — Progress Notes (Signed)
[employer] SN[facility] auth received for 06/27/17.  Percell Locus Taronda Comacho LCSWA [phone]

## 2017-06-27 NOTE — Consult Note (Signed)
ORTHOPAEDIC CONSULTATION  REQUESTING PHYSICIAN: Lind Covert, MD  Chief Complaint: left proximal humerus fracture  HPI: William [name] is a 56 y.o. male who presents with left proximal humerus fracture s/p mechanical fall this past Thursday.  He is a alcoholic with cirrhosis and esophageal varices.  He was admitted for symptomatic anemia.  Pain is severe in the shoulder and doesn't radiate.  Better with sling immobilization.  Ortho consulted.  Past Medical History:  Diagnosis Date  . Anemia   . CHF (congestive heart failure) ([place])   . Chronic kidney disease   . Cirrhosis of liver ([place])   . COMPRESSION FRACTURE, LUMBAR VERTEBRAE 07/29/2010   Qualifier: Diagnosis of  By: Anabel Bene    . Hypertension   . Prostate cancer [facility])    Past Surgical History:  Procedure Laterality Date  . ACHILLES TENDON REPAIR    . ESOPHAGOGASTRODUODENOSCOPY (EGD) WITH PROPOFOL N/A 09/13/2016   Procedure: ESOPHAGOGASTRODUODENOSCOPY (EGD) WITH PROPOFOL;  Surgeon: Wonda Horner, MD;  Location: [facility] ENDOSCOPY;  Service: Endoscopy;  Laterality: N/A;   Social History   Social History  . Marital status: Single    Spouse name: N/A  . Number of children: N/A  . Years of education: N/A   Social History Main Topics  . Smoking status: Never Smoker  . Smokeless tobacco: Never Used  . Alcohol use Yes     Comment: 1 pint per week  . Drug use: No  . Sexual activity: Not Asked   Other Topics Concern  . None   Social History Narrative  . None   Family History  Problem Relation Age of Onset  . Kidney disease Mother   . Arthritis Mother   . Hypertension Father    - negative except otherwise stated in the family history section No Known Allergies Prior to Admission medications   Medication Sig Start Date End Date Taking? Authorizing Provider  allopurinol (ZYLOPRIM) 100 MG tablet TAKE ONE TABLET BY MOUTH ONCE DAILY 01/05/17  Yes Vivi Barrack, MD  folic acid (FOLVITE) 1 MG tablet TAKE  ONE TABLET BY MOUTH DAILY 09/21/16  Yes Vivi Barrack, MD  gabapentin (NEURONTIN) 100 MG capsule Take 1 capsule (100 mg total) by mouth 3 (three) times daily. 05/25/17  Yes Rumley, Tolar N, DO  IRON PO Take 1 tablet by mouth daily.   Yes [provider]  propranolol (INDERAL) 40 MG tablet TAKE ONE TABLET BY MOUTH TWICE DAILY 08/09/16  Yes Vivi Barrack, MD  spironolactone (ALDACTONE) 25 MG tablet Take 2 tablets (50 mg total) by mouth daily. 06/06/17 06/27/17 Yes Vivi Barrack, MD  thiamine (VITAMIN B-1) 100 MG tablet Take 100 mg by mouth daily.   Yes [provider]  colchicine (COLCRYS) 0.6 MG tablet Take 0.5 tablets (0.3 mg total) by mouth daily. Patient not taking: Reported on 05/29/2017 05/25/17   Lorna Few, DO  furosemide (LASIX) 20 MG tablet TAKE 1 TABLET BY MOUTH ONCE DAILY Patient not taking: Reported on 06/24/2017 06/12/17   Vivi Barrack, MD  omeprazole (PRILOSEC) 40 MG capsule TAKE ONE CAPSULE BY MOUTH ONCE DAILY Patient not taking: Reported on 05/29/2017 08/09/16   Vivi Barrack, MD   No results found. - pertinent xrays, CT, MRI studies were reviewed and independently interpreted  Positive ROS: All other systems have been reviewed and were otherwise negative with the exception of those mentioned in the HPI and as above.  Physical Exam: General: Alert, no acute distress  Cardiovascular: No pedal edema Respiratory: No cyanosis, no use of accessory musculature GI: No organomegaly, abdomen is soft and non-tender Skin: No lesions in the area of chief complaint Neurologic: Sensation intact distally Psychiatric: Patient is competent for consent with normal mood and affect Lymphatic: No axillary or cervical lymphadenopathy  MUSCULOSKELETAL:  - moderate swelling of the LUE - hand is swollen, 1+ distal pulse - cap refill sluggish in both hands  Assessment: Left displaced proximal humerus fracture Alcoholic cirrhosis Anemia  Plan: - discussed ORIF with  patient and the r/b/a, he understands and agrees to proceed - will plan for surgery wed - patient currently getting 2 units of RBCs - NPO after midnight  Thank you for the consult and the opportunity to see Mr. William [name]. Eduard Roux, MD [place] 7:52 AM

## 2017-06-27 NOTE — Progress Notes (Signed)
Physical Therapy Treatment Patient Details Name: William [name] MRN: [id number] DOB: 06-22-1961 Today's Date: 06/27/2017    History of Present Illness Pt is a 56 yo m ale admitted through ED on 06/24/17 following an alcohol related fall resulting in a left humeral fracture. Pt was diagnoised with symptomatic anemia. PMH significant for esophogeal varices, chronic anemia, alcholic cirrhosis, polydonal elevation, HTN, CKD3.     PT Comments    Patient is progressing gradually toward mobility goals. Min/mod A required for all mobility. Pt tolerated short distance gait this session. Continue to progress as tolerated with anticipated d/c to SNF for further skilled PT services.    Follow Up Recommendations  SNF;Supervision/Assistance - 24 hour     Equipment Recommendations  None recommended by PT    Recommendations for Other Services       Precautions / Restrictions Precautions Precautions: Fall;Shoulder Type of Shoulder Precautions: NWB Shoulder Interventions: Shoulder sling/immobilizer Precaution Booklet Issued: No Precaution Comments: NWB LUE Required Braces or Orthoses: Sling Restrictions Weight Bearing Restrictions: Yes LUE Weight Bearing: Non weight bearing    Mobility  Bed Mobility Overal bed mobility: Needs Assistance Bed Mobility: Supine to Sit     Supine to sit: Mod assist;HOB elevated     General bed mobility comments: assist to elevate trunk into sitting and maintain NWB L UE  Transfers Overall transfer level: Needs assistance Equipment used: 1 person hand held assist Transfers: Sit to/from Stand Sit to Stand: Mod assist;From elevated surface         General transfer comment: assist to power up into standing and for balance upon stand  Ambulation/Gait Ambulation/Gait assistance: Min assist;+2 safety/equipment Ambulation Distance (Feet): 12 Feet Assistive device: 1 person hand held assist Gait Pattern/deviations: Step-through pattern;Decreased step  length - right;Decreased step length - left;Decreased dorsiflexion - right;Decreased dorsiflexion - left;Wide base of support;Drifts right/left     General Gait Details: assist for balance; cues for increased stride length   Stairs            Wheelchair Mobility    Modified Rankin (Stroke Patients Only)       Balance Overall balance assessment: Needs assistance Sitting-balance support: Single extremity supported;Feet supported Sitting balance-Leahy Scale: Fair     Standing balance support: Single extremity supported Standing balance-Leahy Scale: Poor                              Cognition Arousal/Alertness: Awake/alert Behavior During Therapy: WFL for tasks assessed/performed Overall Cognitive Status: Within Functional Limits for tasks assessed                                        Exercises      General Comments        Pertinent Vitals/Pain Pain Assessment: Faces Faces Pain Scale: Hurts even more Pain Location: LUE with  mobility   (appears worse with wrist movement) Pain Descriptors / Indicators: Grimacing;Guarding Pain Intervention(s): Limited activity within patient's tolerance;Monitored during session;Premedicated before session;Repositioned    Home Living                      Prior Function            PT Goals (current goals can now be found in the care plan section) Acute Rehab PT Goals Patient Stated Goal: none stated PT Goal Formulation: With  patient Time For Goal Achievement: 07/09/17 Potential to Achieve Goals: Good Progress towards PT goals: Progressing toward goals    Frequency    Min 2X/week      PT Plan Current plan remains appropriate    Co-evaluation              AM-PAC PT "6 Clicks" Daily Activity  Outcome Measure  Difficulty turning over in bed (including adjusting bedclothes, sheets and blankets)?: Total Difficulty moving from lying on back to sitting on the side of the  bed? : Total Difficulty sitting down on and standing up from a chair with arms (e.g., wheelchair, bedside commode, etc,.)?: Total Help needed moving to and from a bed to chair (including a wheelchair)?: A Little Help needed walking in hospital room?: A Little Help needed climbing 3-5 steps with a railing? : A Lot 6 Click Score: 11    End of Session Equipment Utilized During Treatment: Gait belt Activity Tolerance: Patient limited by fatigue Patient left: in chair;with call bell/phone within reach;with chair alarm set Nurse Communication: Mobility status PT Visit Diagnosis: Unsteadiness on feet (R26.81);Muscle weakness (generalized) (M62.81);Difficulty in walking, not elsewhere classified (R26.2)     Time: 1000-1027 PT Time Calculation (min) (ACUTE ONLY): 27 min  Charges:  $Gait Training: 8-22 mins $Therapeutic Activity: 8-22 mins                    G Codes:       Earney Navy, PTA Pager: [phone]     Darliss Cheney 06/27/2017, 1:15 PM

## 2017-06-27 NOTE — Progress Notes (Signed)
[employer] auth received for 06/27/17 and given to [facility].   Percell Locus Mcgwire Dasaro LCSWA [phone]

## 2017-06-27 NOTE — Progress Notes (Addendum)
Family Medicine Teaching Service Daily Progress Note Intern Pager: [phone]  Patient name: William [name] Medical record number: [id number] Date of birth: May 25, 1961 Age: 56 y.o. Gender: male  Primary Care Provider: Marjie Skiff, MD Consultants: PT, Orthopedics   Code Status: Full   Pt Overview and Major Events to Date:  Admitted to [facility] on 7/1  Assessment and Plan:  1. Symptomatic anemia, pancytopenia Recently admitted with the same where patient had full work up including bone marrow biopsy, which was negative. Current HR 106. Patient received total of 5 units PRBCs, with current Hemoglobin of 6.9 and Hct 20.1. Denies any acute symptoms of bleeding. Denies blood in stool or coffee ground emesis. FOBT negative in ED. Patient states he feels better but is still slightly weak.  -monitor H/H closely  -CT chest/abdomen/pelvis to look for source of bleeding -monitor reticulocyte count  -continue maintenance fluids with normal saline  -continue Protonix  2. Left Humeral fracture [place] on Thursday night, comminuted fx involving neck and proximal shaft of humerus with multiple displaced bone fragments, displacement of distal fx toward midline with mild varus angulation. Splint applied by ortho tech. Patient currently states pain at a level of 5/10 but increases with movement of arm.  -appreciate ortho recommendations, ortho discussed ORIF   -appreciate ortho recommendatins  -25 mcg fentanyl IV prn for severe pain   3. AKI CKD3. Cr 1.19   -Continue maintenace fluids  -Monitor CMP  4. Hyponatremia Na currently 130. Na was 128 on admission. Likely due to poor intake 2/2 alcoholism.  -Monitor CMP -Continue maintenance fluids   5. Hypokalemia - resolved K currently 4.3. K of 3.3 on admission   6. Hypomagnesemia  Mg currently 1.3 Mg on admission 1.0.  -continue to monitor  -consider repleting Mg if continues to drop   7. Hypophosphatemia Phosphorus currently 1.1.  -Replete  Phosphorus with daily Phosphorus 500 mg and sodium phosphate 11mol IV  7. Alcoholic cirrhosis with esophageal varices  AST 120. AST 141 on admission. ALT 31. ALT 35 on admission. Total bili 9.4. Total bili 7.9 on admission. EtOH <5 on admission. Patient states last drink was on Thursday. Patient denies tremors, anxiety, or agitation.  -monitor on CIWA   FEN/GI: Protonix  PPx: SCD  Disposition: Patient is improving   Subjective:  William [name] a 56yo male presenting for anemia and fx of left humerus following a fall on Thursday. He is currently feeling the same as yesterday, but was much more alert and talkative on exam. Patient reports slight weakness. Patient denies tremor, agitation, or anxiety. Patient reports healthy appetite but admits that having arm is sling makes it difficult to eat.   Objective: Temp:  [97.1 F (36.2 C)-99.5 F (37.5 C)] 99.4 F (37.4 C) (07/03 0629) Pulse Rate:  [100-120] 106 (07/03 0629) Resp:  [17-20] 18 (07/03 0629) BP: (96-138)/(56-87) 96/60 (07/03 0629) SpO2:  [99 %-100 %] 99 % (07/03 00814 Physical Exam:  General: NAD. Awake. Slightly jaundiced.  Eyes: scleral icterus Cardiovascular: RRR. No murmurs, rubs, or gallops. Pulses bilaterally on upper extremities.  Respiratory: CTAB. No increased WOB Abdomen: Soft, non tender, non distended. +BS in all four quadrants  Extremities: left arm in splint.   Laboratory:  Recent Labs Lab 06/25/17 1339 06/26/17 0623 06/26/17 1605 06/27/17 0340  WBC 2.5*  --  1.9* 1.5*  HGB 5.9* 7.3* 7.7* 6.9*  HCT 17.0* 21.2* 22.4* 20.1*  PLT 49*  --  50* 50*    Recent Labs Lab 06/24/17  2035 06/25/17 1143 06/27/17 0340  NA 128* 130* 130*  K 3.3* 4.0 4.3  CL 97* 104 107  CO2 20* 17* 17*  BUN 12 16 15   CREATININE 1.81* 1.50* 1.19  CALCIUM 7.8* 7.4* 7.2*  PROT 6.5 6.2*  --   BILITOT 7.9* 9.4*  --   ALKPHOS 108 98  --   ALT 35 31  --   AST 141* 120*  --   GLUCOSE 107* 135* 106*     Imaging/Diagnostic  Tests: DG Shoulder Left CLINICAL DATA:  Fell onto shoulder with pain EXAM: LEFT SHOULDER - 2+ VIEW COMPARISON:  None. FINDINGS: Left AC joint is intact. The left lung apex is clear. Humeral head maintains its articulation with the glenoid, there is moderate to marked degenerative change. Markedly comminuted fracture involving the neck and proximal shaft of the humerus with multiple displaced bone fragments. A bowel 1/2 shaft diameter of displacement of the distal fracture fragment toward the midline with mild varus angulation. IMPRESSION: Markedly comminuted, and slightly displaced and angulated fracture involving the left humeral neck and proximal shaft. No dislocation of the humeral head. Electronically Signed   By: Donavan Foil M.D.   On: 06/24/2017 20:21  Caroline More, DO 06/27/2017, 7:06 AM PGY-1, [place] Intern pager: [phone], text pages welcome  [place] PAGER [phone] for any questions or notifications regarding this patient   FMTS Attending Daily Note: Dorcas Mcmurray MD  Attending pager:[phone]  office [phone]  I  have seen and examined this patient, reviewed their chart. I have discussed this patient with the resident. I agree with the resident's findings, assessment and care plan. 1. ANEMIA: He has now received a total of 6 u PRBC. There is no visible source of bleeding. His LUE has known fracture but does not seem to be increasing in size and there are no new ecchymoses. He has no abdominal or back pain and denies blood in stool or urine although later this afternoon he did have some darker urine that was observed by nurse. Since his initial incident was a fall, I think we are obligated to look for retroperitoneal or other intra abdominal collection of blood. His hemoglobin has not stabilized and his surgery will have to be delayed until this has been stabilized.  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 Chest, abdomen and pelvis CT with contrast ordered after discussing best test with radiology. Difficult to do any significant blood studies such as haptoglobin, etc at this point as he has received so much blood.  He has no complaints however and denies any pain except in his LUE. Interestingly, the CT of his LUE did not reveal any large hematoma either.  Below is a copied note from his hematologist, Dr. Alen Blew,  at last visit in 10/13/2016.Marland Kitchen Reviews his issues at that office visit: Unclear if he now has some additional factor causing continued anemia or if this may explain difficulty in stabilizing his hemoglobin. 56 year old gentleman with: 1.  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  His repeat serum protein electrophoresis done on September 2017 and is not different from his previous one in 2014. He continues to have polyclonal elevation which indicates reactive process rather than a plasma cell disorder. This makes MGUS less likely and no intervention is needed from that standpoint.  2. Anemia: Likely multifactorial in nature related to his liver disease  although there is no acute GI bleeding noted. Could be related to sequestration from his splenomegaly and have responded to transfusion properly. See no evidence to suggest hemolysis at this time. His anemia is not related to polyclonal gammopathy or plasma cell disorder.   From a management standpoint, periodic follow-up and supplementing him with iron and occasional transfusion may be needed.  I offered him continuous follow-up but he declined at this time and he said that he would let me know if you need my services in the future.  2.  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  3. Thrombocytopenia: No bleeding noted at this time. His  recent platelet count is close to his baseline of 99,000.  4. Leukocytopenia: His most recent count is 2.2 on 09/16/2016. He had a normal differential. This is likely to sequestration from his splenomegaly and not dramatically different from previous counts.   "

## 2017-06-27 NOTE — Progress Notes (Signed)
FPTS Interim Progress Note  S: Mr. Eidem states that he is doing better. He reports slight weakness but notices improvement since yesterday. Patient denies blood in urine, stool, or emesis. Patient denies pain in abdomen, back, or lower extremities. Patient reports pain in Left arm. Nurse noticed darkening in urine starting today.   O: BP 111/70   Pulse (!) 101   Temp 98.8 F (37.1 C) (Oral)   Resp 16   Ht 5\' 11"  (1.803 m)   Wt 185 lb (83.9 kg)   SpO2 100%   BMI 25.80 kg/m     A/P: Symptomatic anemia, pancytopenia On exam patient did not have bruising in back, abdomen, or pelvis.  -Continue to follow H/H -CT chest/abdomen/pelvis to look for source of bleeding -reticulocyte count 54.3 -continue maintenance fluids with normal saline  -continue Protonix -monitor urine for frank blood  Caroline More, DO 06/27/2017, 3:18 PM PGY-1, [place] Medicine Service pager [phone]

## 2017-06-27 NOTE — Progress Notes (Signed)
I spoke with the patient and his daughter at bedside this afternoon.  Patient is not medically optimized for surgery tomorrow.  He would benefit from additional transfusions in order to achieve a Hgb of around 10.  Would also benefit from FFP transfusions.  Surgery of proximal humerus has potential for not insignificant blood loss.  Surgery is tentatively scheduled for Friday afternoon for medical optimization.  CBC pending.

## 2017-06-27 NOTE — Clinical Social Work Note (Signed)
Clinical Social Work Assessment  Patient Details  Name: William [name] MRN: [id number] Date of Birth: 1961/08/13  Date of referral:  06/26/17               Reason for consult:  Facility Placement                Permission sought to share information with:  Chartered certified accountant granted to share information::  Yes, Verbal Permission Granted  Name::     Marketing executive::  SNF  Relationship::  brother  Contact Information:     Housing/Transportation Living arrangements for the past 2 months:  Mobile Home Source of Information:  Patient Patient Interpreter Needed:  None Criminal Activity/Legal Involvement Pertinent to Current Situation/Hospitalization:  No - Comment as needed Significant Relationships:  Adult Children, Siblings Lives with:  Self Do you feel safe going back to the place where you live?  No Need for family participation in patient care:  No (Coment)  Care giving concerns:  Pt lives at home alone and does not have consistent help available- currently with increase in impairment.   Social Worker assessment / plan:  CSW spoke with pt and pt brother regarding PT recommendation for SNF.  Explained SNF and SNF referral process.  Patient reports he has been to SNF in the past.  Employment status:  Disabled (Comment on whether or not currently receiving Disability) Insurance information:  Managed Medicare PT Recommendations:  [place] / Referral to community resources:  [place]  Patient/Family's Response to care:  Agreeable to SNF stay again- hopeful for [facility] where he has been before.  Patient/Family's Understanding of and Emotional Response to Diagnosis, Current Treatment, and Prognosis:  No questions or concerns- hopeful for quick recovery at SNF.  Emotional Assessment Appearance:  Appears stated age Attitude/Demeanor/Rapport:    Affect (typically observed):  Appropriate,  Accepting Orientation:  Oriented to Self, Oriented to Place, Oriented to  Time, Oriented to Situation Alcohol / Substance use:  Not Applicable Psych involvement (Current and /or in the community):  No (Comment)  Discharge Needs  Concerns to be addressed:  Care Coordination Readmission within the last 30 days:  No Current discharge risk:  Physical Impairment Barriers to Discharge:  Continued Medical Work up   Jorge Ny, LCSW 06/27/2017, 8:46 AM

## 2017-06-27 NOTE — Care Management Note (Addendum)
Case Management Note  Patient Details  Name: JLYNN LANGILLE MRN: [id number] Date of Birth: 02/27/1961  Subjective/Objective:         Pt admitted with anemia and left proximal humerus fracture, s/p mechanical fall.  He is a alcoholic with cirrhosis and esophageal varices.              [name] (Daughter) Deonta Bomberger ([phone]              YYQ:MGNOIBBC [place]   Action/Plan: Surgery pending 06/28/2017, ORIF......Marland KitchenCM following for disposition needs....Marland KitchenMarland KitchenCM received consult: No assistance at home. May require home health at D/C.   Expected Discharge Date:  06/28/17               Expected Discharge Plan:     In-House Referral:     Discharge planning Services     Post Acute Care Choice:    Choice offered to:     DME Arranged:    DME Agency:     HH Arranged:    HH Agency:     Status of Service:     If discussed at [employer], dates discussed:    Additional Comments:  Sharin Mons, RN 06/27/2017, 10:46 AM

## 2017-06-28 LAB — CBC
HEMATOCRIT: 26.6 % — AB (ref 39.0–52.0)
Hemoglobin: 8.9 g/dL — ABNORMAL LOW (ref 13.0–17.0)
MCH: 30.1 pg (ref 26.0–34.0)
MCHC: 33.5 g/dL (ref 30.0–36.0)
MCV: 89.9 fL (ref 78.0–100.0)
PLATELETS: 51 10*3/uL — AB (ref 150–400)
RBC: 2.96 MIL/uL — ABNORMAL LOW (ref 4.22–5.81)
RDW: 19.1 % — AB (ref 11.5–15.5)
WBC: 1.7 10*3/uL — AB (ref 4.0–10.5)

## 2017-06-28 LAB — URINALYSIS, ROUTINE W REFLEX MICROSCOPIC
Glucose, UA: NEGATIVE mg/dL
HGB URINE DIPSTICK: NEGATIVE
Ketones, ur: NEGATIVE mg/dL
Leukocytes, UA: NEGATIVE
Nitrite: NEGATIVE
PH: 6 (ref 5.0–8.0)
Protein, ur: NEGATIVE mg/dL
SPECIFIC GRAVITY, URINE: 1.032 — AB (ref 1.005–1.030)

## 2017-06-28 LAB — BASIC METABOLIC PANEL
ANION GAP: 9 (ref 5–15)
BUN: 13 mg/dL (ref 6–20)
CALCIUM: 7.6 mg/dL — AB (ref 8.9–10.3)
CO2: 15 mmol/L — AB (ref 22–32)
Chloride: 106 mmol/L (ref 101–111)
Creatinine, Ser: 1.16 mg/dL (ref 0.61–1.24)
GFR calc Af Amer: >60 mL/min (ref >60)
GLUCOSE: 96 mg/dL (ref 65–99)
POTASSIUM: 4.6 mmol/L (ref 3.5–5.1)
Sodium: 130 mmol/L — ABNORMAL LOW (ref 135–145)

## 2017-06-28 LAB — PHOSPHORUS: Phosphorus: 2.1 mg/dL — ABNORMAL LOW (ref 2.5–4.6)

## 2017-06-28 LAB — PREPARE RBC (CROSSMATCH)

## 2017-06-28 LAB — MAGNESIUM: Magnesium: 1.3 mg/dL — ABNORMAL LOW (ref 1.7–2.4)

## 2017-06-28 MED ORDER — SODIUM CHLORIDE 0.9 % IV SOLN
Freq: Once | INTRAVENOUS | Status: AC
  Administered 2017-06-30: 15:00:00 via INTRAVENOUS

## 2017-06-28 NOTE — Progress Notes (Signed)
Came by to see Mr. Grisby today. He reports pain of 8/10 in Left shoulder. He states that he has no other pain. Patient says he overall feels ok.   Dalphine Handing, PGY-1 [facility]  06/28/2017 3:02 PM

## 2017-06-28 NOTE — Progress Notes (Signed)
Patient is a high fall risk due to falling and fracturing his left upper extremity prior to be admitted on 6/30. Patient is a fracture risk. RN educated patient about being placed on low bed in order to prevent injury if he fell in the hospital. Patient refused to be moved and did not want to be placed on a low bed. Charge RN Lelan Pons aware of situation. Patient is not trying to climb out of bed and is using the call bell when needing assistance. Will continue to monitor and treat per MD orders.

## 2017-06-28 NOTE — Progress Notes (Signed)
Family Medicine Teaching Service Daily Progress Note Intern Pager: [phone]  Patient name: William [name] Medical record number: [id number] Date of birth: [birth year] Age: 56 y.o. Gender: male  Primary Care Provider: Marjie Skiff, MD Consultants: PT, Orthopedics Code Status: Full  Pt Overview and Major Events to Date:  Admitted to [facility] on 06/25/17  Assessment and Plan:  1. Symptomatic anemia, pancytopenia Recently admitted with the same where patient had full work up including bone marrow biopsy, which was negative. Current HR 106. Patient received total of 6 units PRBCs, with current Hemoglobin of 8.9, showing improvement from 6.9 on 7/3, and Hct 26.6, showing improvement from from 20.1 on 7/3. Denies any acute symptoms of bleeding. Denies blood in stool, urine, or coffee ground emesis. FOBT negative in ED. CT of abdomen and pelvis with contras showed vague hematoma measuring 9.7x8.2x6.6, and soft tissue injury and mild hemorrhage overlying abdominal wall, and mild intramuscular hemorrhage. Reticulocyte cound was 54.3 on 7/3. Patient states he feels better but is still slightly weak. Patient had slight bruising around left axilla. Possibly due to sequestration in liver or spleen. Per ortho recommendation patient would benefit from additional transfusion to achieve Hgb of ~10.   -monitor H/H closely following transfusion  -appreciate ortho recommendations  -continue maintenance fluids with normal saline  -continue Protonix  2. Left Humeral fracture [place] on Thursday night, comminuted fx involving neck and proximal shaft of humerus with multiple displaced bone fragments, displacement of distal fx toward midline with mild varus angulation. Splint applied by ortho tech. Patient currently states pain at a level of 9/10 but pain can decrease to 5/10 without movement. Orthopedics has planned for tentative ORIF surgery on 06/30/17.  -appreciate ortho recommendations -25 mcg fentanyl IV prn for  severe pain   3. AKI CKD3. Current Cr 1.16   -Continue maintenace fluids  -Monitor CMP  4. Hyponatremia Na currently 130. Has remained 130 since 7/1. Na was 128 on admission. Likely due to poor intake 2/2 alcoholism.  -Monitor CMP -Continue maintenance fluids   5. Hypokalemia - resolved K currently 4.6. K of 3.3 on admission   6. Hypomagnesemia  Mg currently 1.3 following repletion on 7/3. Unchanged form 1.3 on 7/3. Mg on admission 1.0.  -continue 800 mg oral daily -continue to monitor   7. Hypophosphatemia Phosphorus currently 2.1. Improved from 1.1 on 7/3.  -Replete Phosphorus with daily Phosphorus 500 mg  -continue to monitor  7. Alcoholic cirrhosis with esophageal varices  AST 120 on 7/1. AST 141 on admission. ALT 31 on 7/1. ALT 35 on admission. Total bili 9.4 on 7/1. Total bili 7.9 on admission. EtOH <5 on admission. Patient states last drink was on Thursday. Patient denies tremors, anxiety, tactile or visual disturbance, or agitation.  -continue to monitor on CIWA   FEN/GI: Protonix PPx: SCD  Disposition: Patient is improving.   Subjective:  William [name] is a 56 yo male presenting for anemia and fx of left humerus following a fall on Thursday. Patient currently states is he doing well. States that he has a 9/10 pain in the left arm, but attests this to recent movement. States that pain decreases to a 5/10 when staying still. Patient states some weakness. Patient denies agitations, fatigue, anxiety, tremors, or visual or tactile disturbances. Patient states that he has some swelling in left leg, but states this has been there as long as he can remember.    Objective: Temp:  [98.4 F (36.9 C)-99.7 F (37.6 C)] 98.8 F (37.1 C) (07/04  8453) Pulse Rate:  [100-107] 106 (07/04 0547) Resp:  [16-18] 18 (07/04 0547) BP: (104-120)/(61-74) 112/70 (07/04 0547) SpO2:  [97 %-100 %] 98 % (07/04 0547) Physical Exam: General: NAD, awake and alert. Slightly jaundiced.   Cardiovascular: RRR, no MRG, 1+ pulses in left radial, 2+ pulses in right radial Respiratory: CTAB, no wheezing, rales, or crackles  Abdomen: Soft non tender, non distended. Bowel sounds heard in all 4 quadrants  Extremities: edema in left arm, edema in left leg  Laboratory:  Recent Labs Lab 06/26/17 1605 06/27/17 0340 06/27/17 1859 06/28/17 0600  WBC 1.9* 1.5*  --  1.7*  HGB 7.7* 6.9* 9.0* 8.9*  HCT 22.4* 20.1* 26.8* 26.6*  PLT 50* 50*  --  51*    Recent Labs Lab 06/24/17 2035 06/25/17 1143 06/27/17 0340 06/28/17 0600  NA 128* 130* 130* 130*  K 3.3* 4.0 4.3 4.6  CL 97* 104 107 106  CO2 20* 17* 17* 15*  BUN 12 16 15 13   CREATININE 1.81* 1.50* 1.19 1.16  CALCIUM 7.8* 7.4* 7.2* 7.6*  PROT 6.5 6.2*  --   --   BILITOT 7.9* 9.4*  --   --   ALKPHOS 108 98  --   --   ALT 35 31  --   --   AST 141* 120*  --   --   GLUCOSE 107* 135* 106* 96     Imaging/Diagnostic Tests: CT Abdomen Pelvis w Contrast  CLINICAL DATA:  Acute onset of upper back pain. Decreased hemoglobin. Assess for underlying hemorrhage. Initial encounter. EXAM: CT CHEST, ABDOMEN, AND PELVIS WITH CONTRAST TECHNIQUE: Multidetector CT imaging of the chest, abdomen and pelvis was performed following the standard protocol during bolus administration of intravenous contrast. CONTRAST:  134m ISOVUE-300 IOPAMIDOL (ISOVUE-300) INJECTION 61% COMPARISON:  CT of the abdomen performed 09/10/2016, and chest radiograph performed 05/17/2017. CT of the left shoulder performed 06/26/2017 FINDINGS: CT CHEST FINDINGS Cardiovascular: Scattered coronary artery calcifications are seen. The heart remains normal in size. Mild calcification is noted along the thoracic aorta. The proximal great vessels are grossly unremarkable. Mediastinum/Nodes: The mediastinum is otherwise unremarkable. No mediastinal lymphadenopathy is seen. No pericardial effusion is identified. The thyroid gland is unremarkable. No  axillary lymphadenopathy is seen. Lungs/Pleura: Patchy bibasilar atelectasis is noted. Trace left-sided pleural fluid is seen. No pneumothorax is identified. No masses are seen. Mild emphysematous change is noted bilaterally. Musculoskeletal: There is a comminuted and displaced fracture of the left humeral neck, as recently noted. A vague hematoma is seen tracking about the left shoulder and left axilla, measuring approximately 9.7 x 8.2 x 6.6 cm. There is chronic joint space loss at the glenohumeral joints bilaterally. There is asymmetric prominence of the left pectoralis musculature, thought to reflect mild intramuscular hemorrhage. Overlying soft tissue injury is noted tracking along the left lateral chest wall. There is diffuse asymmetric prominence of the musculature along the left lateral abdominal wall, with overlying fluid, likely reflecting soft tissue injury and mild hemorrhage. Mild bilateral gynecomastia is incidentally noted. CT ABDOMEN PELVIS FINDINGS Hepatobiliary: There is a diffusely nodular contour to the liver, compatible with hepatic cirrhosis. No definite dominant mass is seen within the liver. Trace ascites is seen tracking about the liver. The portal venous system remains grossly patent. Stones are noted dependently within the gallbladder. The gallbladder is otherwise grossly unremarkable. The common bile duct remains normal in caliber. Pancreas: The pancreas is within normal limits. Spleen: The spleen is enlarged, measuring 17.5 cm in length. Adrenals/Urinary Tract: The  adrenal glands are unremarkable in appearance. The kidneys are within normal limits. There is no evidence of hydronephrosis. No renal or ureteral stones are identified. Nonspecific perinephric stranding is noted bilaterally. Stomach/Bowel: The stomach is unremarkable in appearance. The small bowel is within normal limits. The appendix is normal in caliber, without evidence of appendicitis. The  colon is unremarkable in appearance. Vascular/Lymphatic: Scattered calcification is seen along the abdominal aorta and its branches. The abdominal aorta is otherwise grossly unremarkable. The inferior vena cava is grossly unremarkable. No retroperitoneal lymphadenopathy is seen. No pelvic sidewall lymphadenopathy is identified. Reproductive: The bladder is mildly distended and grossly unremarkable. The prostate remains normal in size. Other: A small amount ascites is noted within the pelvis and along the paracolic gutters bilaterally. Musculoskeletal: No acute osseous abnormalities are identified. There is chronic loss of height involving the superior endplate of L2. Soft tissue edema is noted overlying the hips bilaterally. The visualized musculature is unremarkable in appearance. IMPRESSION: 1. Comminuted displaced fracture of the left humeral neck, with vague soft tissue hematoma tracking about the left shoulder and left axilla, measuring approximately 9.7 x 8.2 x 6.6 cm. 2. Asymmetric prominence of the left pectoralis musculature, thought to reflect mild intramuscular hemorrhage. Overlying soft tissue injury tracking along the left lateral chest wall. 3. Diffuse asymmetric prominence of the musculature along the left lateral abdominal wall, with overlying fluid, likely reflecting soft tissue injury and mild hemorrhage. 4. Findings of hepatic cirrhosis, with trace ascites noted within the abdomen and pelvis. 5. Splenomegaly. 6. Scattered aortic atherosclerosis. 7. Scattered coronary artery calcifications seen. 8. Patchy bibasilar atelectasis noted.  Trace left pleural effusion. 9. Cholelithiasis.  Gallbladder otherwise grossly unremarkable. 10. Chronic loss of height involving the superior endplate of L2. 11. Soft tissue edema overlying the hips bilaterally. Electronically Signed   By: Garald Balding M.D.   On: 06/27/2017 21:46  CT Chest w Contrast CLINICAL DATA:  Acute  onset of upper back pain. Decreased hemoglobin. Assess for underlying hemorrhage. Initial encounter. EXAM: CT CHEST, ABDOMEN, AND PELVIS WITH CONTRAST TECHNIQUE: Multidetector CT imaging of the chest, abdomen and pelvis was performed following the standard protocol during bolus administration of intravenous contrast. CONTRAST:  165m ISOVUE-300 IOPAMIDOL (ISOVUE-300) INJECTION 61% COMPARISON:  CT of the abdomen performed 09/10/2016, and chest radiograph performed 05/17/2017. CT of the left shoulder performed 06/26/2017 FINDINGS: CT CHEST FINDINGS Cardiovascular: Scattered coronary artery calcifications are seen. The heart remains normal in size. Mild calcification is noted along the thoracic aorta. The proximal great vessels are grossly unremarkable. Mediastinum/Nodes: The mediastinum is otherwise unremarkable. No mediastinal lymphadenopathy is seen. No pericardial effusion is identified. The thyroid gland is unremarkable. No axillary lymphadenopathy is seen. Lungs/Pleura: Patchy bibasilar atelectasis is noted. Trace left-sided pleural fluid is seen. No pneumothorax is identified. No masses are seen. Mild emphysematous change is noted bilaterally. Musculoskeletal: There is a comminuted and displaced fracture of the left humeral neck, as recently noted. A vague hematoma is seen tracking about the left shoulder and left axilla, measuring approximately 9.7 x 8.2 x 6.6 cm. There is chronic joint space loss at the glenohumeral joints bilaterally. There is asymmetric prominence of the left pectoralis musculature, thought to reflect mild intramuscular hemorrhage. Overlying soft tissue injury is noted tracking along the left lateral chest wall. There is diffuse asymmetric prominence of the musculature along the left lateral abdominal wall, with overlying fluid, likely reflecting soft tissue injury and mild hemorrhage. Mild bilateral gynecomastia is incidentally noted. CT ABDOMEN PELVIS  FINDINGS Hepatobiliary: There  is a diffusely nodular contour to the liver, compatible with hepatic cirrhosis. No definite dominant mass is seen within the liver. Trace ascites is seen tracking about the liver. The portal venous system remains grossly patent. Stones are noted dependently within the gallbladder. The gallbladder is otherwise grossly unremarkable. The common bile duct remains normal in caliber. Pancreas: The pancreas is within normal limits. Spleen: The spleen is enlarged, measuring 17.5 cm in length. Adrenals/Urinary Tract: The adrenal glands are unremarkable in appearance. The kidneys are within normal limits. There is no evidence of hydronephrosis. No renal or ureteral stones are identified. Nonspecific perinephric stranding is noted bilaterally. Stomach/Bowel: The stomach is unremarkable in appearance. The small bowel is within normal limits. The appendix is normal in caliber, without evidence of appendicitis. The colon is unremarkable in appearance. Vascular/Lymphatic: Scattered calcification is seen along the abdominal aorta and its branches. The abdominal aorta is otherwise grossly unremarkable. The inferior vena cava is grossly unremarkable. No retroperitoneal lymphadenopathy is seen. No pelvic sidewall lymphadenopathy is identified. Reproductive: The bladder is mildly distended and grossly unremarkable. The prostate remains normal in size. Other: A small amount ascites is noted within the pelvis and along the paracolic gutters bilaterally. Musculoskeletal: No acute osseous abnormalities are identified. There is chronic loss of height involving the superior endplate of L2. Soft tissue edema is noted overlying the hips bilaterally. The visualized musculature is unremarkable in appearance. IMPRESSION: 1. Comminuted displaced fracture of the left humeral neck, with vague soft tissue hematoma tracking about the left shoulder and left axilla, measuring approximately  9.7 x 8.2 x 6.6 cm. 2. Asymmetric prominence of the left pectoralis musculature, thought to reflect mild intramuscular hemorrhage. Overlying soft tissue injury tracking along the left lateral chest wall. 3. Diffuse asymmetric prominence of the musculature along the left lateral abdominal wall, with overlying fluid, likely reflecting soft tissue injury and mild hemorrhage. 4. Findings of hepatic cirrhosis, with trace ascites noted within the abdomen and pelvis. 5. Splenomegaly. 6. Scattered aortic atherosclerosis. 7. Scattered coronary artery calcifications seen. 8. Patchy bibasilar atelectasis noted.  Trace left pleural effusion. 9. Cholelithiasis.  Gallbladder otherwise grossly unremarkable. 10. Chronic loss of height involving the superior endplate of L2. 11. Soft tissue edema overlying the hips bilaterally. Electronically Signed   By: Garald Balding M.D.   On: 06/27/2017 21:46  DG Shoulder Left CLINICAL DATA: [place] onto shoulder with pain EXAM: LEFT SHOULDER - 2+ VIEW COMPARISON: None. FINDINGS: Left AC joint is intact. The left lung apex is clear. Humeral head maintains its articulation with the glenoid, there is moderate to marked degenerative change. Markedly comminuted fracture involving the neck and proximal shaft of the humerus with multiple displaced bone fragments. A bowel 1/2 shaft diameter of displacement of the distal fracture fragment toward the midline with mild varus angulation. IMPRESSION: Markedly comminuted, and slightly displaced and angulated fracture involving the left humeral neck and proximal shaft. No dislocation of the humeral head. Electronically Signed By: Donavan Foil M.D. On: 06/24/2017 20:21  Caroline More, DO 06/28/2017, 12:02 PM PGY-1, [facility] Intern pager: [phone], text pages welcome

## 2017-06-28 NOTE — Care Management Important Message (Signed)
Important Message  Patient Details  Name: William [name] MRN: [id number] Date of Birth: Apr 28, 1961   Medicare Important Message Given:  Yes    Nathen May 06/28/2017, 9:42 AM

## 2017-06-29 LAB — PREPARE FRESH FROZEN PLASMA
UNIT DIVISION: 0
Unit division: 0

## 2017-06-29 LAB — BPAM RBC
BLOOD PRODUCT EXPIRATION DATE: 201807182359
BLOOD PRODUCT EXPIRATION DATE: 201807212359
BLOOD PRODUCT EXPIRATION DATE: 201807232359
BLOOD PRODUCT EXPIRATION DATE: 201807262359
Blood Product Expiration Date: 201807182359
Blood Product Expiration Date: 201807222359
Blood Product Expiration Date: 201807242359
Blood Product Expiration Date: 201807262359
ISSUE DATE / TIME: 201807041512
ISSUE DATE / TIME: 201807010300
ISSUE DATE / TIME: 201807010616
ISSUE DATE / TIME: 201807012244
ISSUE DATE / TIME: 201807020131
ISSUE DATE / TIME: 201807030601
ISSUE DATE / TIME: 201807031302
ISSUE DATE / TIME: 201807041830
UNIT TYPE AND RH: 7300
UNIT TYPE AND RH: 7300
UNIT TYPE AND RH: 7300
Unit Type and Rh: 7300
Unit Type and Rh: 7300
Unit Type and Rh: 7300
Unit Type and Rh: 7300
Unit Type and Rh: 7300

## 2017-06-29 LAB — BASIC METABOLIC PANEL
ANION GAP: 8 (ref 5–15)
BUN: 16 mg/dL (ref 6–20)
CHLORIDE: 108 mmol/L (ref 101–111)
CO2: 17 mmol/L — ABNORMAL LOW (ref 22–32)
Calcium: 8.3 mg/dL — ABNORMAL LOW (ref 8.9–10.3)
Creatinine, Ser: 0.99 mg/dL (ref 0.61–1.24)
GFR calc non Af Amer: >60 mL/min (ref >60)
Glucose, Bld: 86 mg/dL (ref 65–99)
POTASSIUM: 5 mmol/L (ref 3.5–5.1)
SODIUM: 133 mmol/L — AB (ref 135–145)

## 2017-06-29 LAB — TYPE AND SCREEN
ABO/RH(D): B POS
ANTIBODY SCREEN: NEGATIVE
UNIT DIVISION: 0
UNIT DIVISION: 0
UNIT DIVISION: 0
UNIT DIVISION: 0
Unit division: 0
Unit division: 0
Unit division: 0
Unit division: 0

## 2017-06-29 LAB — BPAM FFP
BLOOD PRODUCT EXPIRATION DATE: 201807082359
Blood Product Expiration Date: 201807082359
ISSUE DATE / TIME: 201807040202
ISSUE DATE / TIME: 201807032158
UNIT TYPE AND RH: 7300
UNIT TYPE AND RH: 7300

## 2017-06-29 LAB — CBC
HCT: 32.9 % — ABNORMAL LOW (ref 39.0–52.0)
HEMATOCRIT: 29.9 % — AB (ref 39.0–52.0)
HEMATOCRIT: 29.9 % — AB (ref 39.0–52.0)
HEMOGLOBIN: 10.1 g/dL — AB (ref 13.0–17.0)
HEMOGLOBIN: 10.1 g/dL — AB (ref 13.0–17.0)
HEMOGLOBIN: 10.7 g/dL — AB (ref 13.0–17.0)
MCH: 29.9 pg (ref 26.0–34.0)
MCH: 30.1 pg (ref 26.0–34.0)
MCH: 29.7 pg (ref 26.0–34.0)
MCHC: 33.8 g/dL (ref 30.0–36.0)
MCHC: 33.8 g/dL (ref 30.0–36.0)
MCHC: 32.5 g/dL (ref 30.0–36.0)
MCV: 88.5 fL (ref 78.0–100.0)
MCV: 89 fL (ref 78.0–100.0)
MCV: 91.4 fL (ref 78.0–100.0)
Platelets: 52 10*3/uL — ABNORMAL LOW (ref 150–400)
Platelets: 55 10*3/uL — ABNORMAL LOW (ref 150–400)
Platelets: 60 10*3/uL — ABNORMAL LOW (ref 150–400)
RBC: 3.38 MIL/uL — ABNORMAL LOW (ref 4.22–5.81)
RBC: 3.36 MIL/uL — AB (ref 4.22–5.81)
RBC: 3.6 MIL/uL — AB (ref 4.22–5.81)
RDW: 18.8 % — ABNORMAL HIGH (ref 11.5–15.5)
RDW: 18.8 % — AB (ref 11.5–15.5)
RDW: 19.2 % — ABNORMAL HIGH (ref 11.5–15.5)
WBC: 2.2 10*3/uL — AB (ref 4.0–10.5)
WBC: 2.2 10*3/uL — AB (ref 4.0–10.5)
WBC: 2.1 10*3/uL — ABNORMAL LOW (ref 4.0–10.5)

## 2017-06-29 LAB — MAGNESIUM: Magnesium: 1.3 mg/dL — ABNORMAL LOW (ref 1.7–2.4)

## 2017-06-29 LAB — PHOSPHORUS: PHOSPHORUS: 2.3 mg/dL — AB (ref 2.5–4.6)

## 2017-06-29 MED ORDER — MAGNESIUM SULFATE 2 GM/50ML IV SOLN
2.0000 g | Freq: Once | INTRAVENOUS | Status: AC
  Administered 2017-06-29: 2 g via INTRAVENOUS
  Filled 2017-06-29: qty 50

## 2017-06-29 MED ORDER — CEFAZOLIN SODIUM-DEXTROSE 2-4 GM/100ML-% IV SOLN
2.0000 g | INTRAVENOUS | Status: AC
  Administered 2017-06-30: 2 g via INTRAVENOUS
  Filled 2017-06-29 (×2): qty 100

## 2017-06-29 NOTE — H&P (Signed)

## 2017-06-29 NOTE — Progress Notes (Signed)
William [name] is doing well this morning. His hemoglobin has risen to 10.1. At this point I think he has been adequately medically optimized for planned surgery tomorrow on his left proximal humerus fracture. I again discussed this with the patient. All questions were answered to his satisfaction. We will plan on having him be NPO after midnight.

## 2017-06-29 NOTE — Progress Notes (Signed)
Came by to see Mr. William [name] this afternoon. Patient states he is doing better. Has a pain of 5/10 in left arm. Patient denies tremor, agitation, anxiety, or dizziness. Overall patient is improving and is agreeing to surgery on 7/6.   Dalphine Handing, PGY-1 [facility]  06/29/2017 3:18 PM

## 2017-06-29 NOTE — Progress Notes (Signed)
Family Medicine Teaching Service Daily Progress Note Intern Pager: [phone]  Patient name: William [name] Medical record number: [id number] Date of birth: [birth year] Age: 56 y.o. Gender: male  Primary Care Provider: Marjie Skiff, MD Consultants: PT, Orthopedics  Code Status: Full   Pt Overview and Major Events to Date:  Admitted to [facility] on 06/25/17  Assessment and Plan:  1. Symptomatic anemia, pancytopenia Recently admitted with the same where patient had full work up including bone marrow biopsy, which was negative. Current HR 104. Patient received total of 10 units PRBCs, with current Hemoglobin of 10.1, showing improvement from 8.9 on 7/4, and Hct 29.9, showing improvement from from 26.6 on 7/4. Denies acute symptoms of bleeding. Patient denies blood in stool, urine, or coffee ground emesis. FOBT negative in ED. CT of abdomen and pelvis with contrast on 06/27/17 showed vague hematoma measuring 9.7x8.2x6.6, and soft tissue injury and mild hemorrhage overlying abdominal wall, and mild intramuscular hemorrhage. Reticulocyte cound was 54.3 on 7/3. Patient states he feels better today. Patient denies dizziness or weakness. Possible cause of anemia is sequestration in liver or spleen.   -monitor H/H closely -consider transfusion if drop in Hemoglobin  -appreciate ortho recommendations  -continue maintenance fluids with normal saline  -continue Protonix  2. Left Humeral fracture [place] [address] on Thursday night, comminuted fx involving neck and proximal shaft of humerus with multiple displaced bone fragments, displacement of distal fx toward midline with mild varus angulation. Splint applied by ortho tech on 06/25/17. Patient currently states pain of 8/10 in left arm. States that his sleep is compromised due to increased pain. Orthopedics has planned for ORIF surgery on 06/30/17 and NPO after midnight.  -appreciate ortho recommendations -25 mcg fentanyl IV prn for severe pain   3. AKI CKD3. Current Cr  0.99 -Continue maintenace fluids  -Monitor CMP  4. Hyponatremia Na currently 133. Na was 128 on admission. Likely due to poor intake 2/2 alcoholism.  -Monitor CMP -Continue maintenance fluids   5. Hypokalemia - resolved K currently 5.0. K of 3.3 on admission   6. Hypomagnesemia  Mg currently 1.3. Unchanged form 1.3 on 7/4. Mg on admission 1.0.  -magnesium sulfate 2 g IV  -continue 800 mg oral daily -continue to monitor   7. Hypophosphatemia Phosphorus currently 2.3. Improved from 2.1 on 7/4. -Replete Phosphorus with daily Phosphorus 500 mg  -continue to monitor  7. Alcoholic cirrhosis with esophageal varices  AST 120 on 7/1. AST 141 on admission. ALT 31 on 7/1. ALT 35 on admission. Total bili 9.4 on 7/1. Total bili 7.9 on admission. EtOH <5 on admission. Patient states last drink was on Thursday. Patient denies dizziness, weakness, agitation, anxiety, visual or tactile disturbances. Patient had slight tremors on exam  -continue to monitor on CIWA   FEN/GI: Protonix PPx: SCD   Disposition: Patient is improving   Subjective:  William [name] is a 56 yo male presenting for anemia and fx of left humerus following a fall on 6/28. He states that he feels better today but his arm is having increased pain. Patient states pain of 8/10 in left arm, but denies pain elsewhere. Patietn denies dizziness, weakness, agitation, anxiety, visual/tactile disturbance, cough, shortness of breath, blood in stool, blood in urine, or coffee ground emesis. Patient states he discussed left arm with with orthopedics and has surgery planned for 7/6. Patient states he feels much better after final units of blood.   Objective: Temp:  [98.3 F (36.8 C)-98.9 F (37.2 C)] 98.6 F (37 C) (07/05  0534) Pulse Rate:  [76-104] 104 (07/05 0534) Resp:  [18-20] 18 (07/05 0534) BP: (116-148)/(71-88) 117/78 (07/05 0534) SpO2:  [96 %-100 %] 98 % (07/05 0534) Physical Exam: General: in slight distress due to pain,  awake and alert, sitting up in bed  Cardiovascular: RRR, no MRG, 1+ pulse in left radial, 2+ pulse in right radial  Respiratory: CTAB, no increased work of breathing, no wheezes, rales, or rhonchi   Abdomen: soft, non tender, non distended, bowel sounds heard in all 4 quadrants  Extremities: edema in left arm and hand, decreased grip strength in left hand    Laboratory:  Recent Labs Lab 06/28/17 0600 06/29/17 0144 06/29/17 0731  WBC 1.7* 2.2* 2.2*  HGB 8.9* 10.1* 10.1*  HCT 26.6* 29.9* 29.9*  PLT 51* 52* 55*    Recent Labs Lab 06/24/17 2035 06/25/17 1143 06/27/17 0340 06/28/17 0600 06/29/17 0731  NA 128* 130* 130* 130* 133*  K 3.3* 4.0 4.3 4.6 5.0  CL 97* 104 107 106 108  CO2 20* 17* 17* 15* 17*  BUN _0 CREATININE 1.81* 1.50* 1.19 1.16 0.99  CALCIUM 7.8* 7.4* 7.2* 7.6* 8.3*  PROT 6.5 6.2*  --   --   --   BILITOT 7.9* 9.4*  --   --   --   ALKPHOS 108 98  --   --   --   ALT 35 31  --   --   --   AST 141* 120*  --   --   --   GLUCOSE 107* 135* 106* 96 86     Imaging/Diagnostic Tests: CT Abdomen Pelvis w Contrast  CLINICAL DATA: Acute onset of upper back pain. Decreased hemoglobin. Assess for underlying hemorrhage. Initial encounter. EXAM: CT CHEST, ABDOMEN, AND PELVIS WITH CONTRAST TECHNIQUE: Multidetector CT imaging of the chest, abdomen and pelvis was performed following the standard protocol during bolus administration of intravenous contrast. CONTRAST: 139m ISOVUE-300 IOPAMIDOL (ISOVUE-300) INJECTION 61% COMPARISON: CT of the abdomen performed 09/10/2016, and chest radiograph performed 05/17/2017. CT of the left shoulder performed 06/26/2017 FINDINGS: CT CHEST FINDINGS Cardiovascular: Scattered coronary artery calcifications are seen. The heart remains normal in size. Mild calcification is noted along the thoracic aorta. The proximal great vessels are grossly unremarkable. Mediastinum/Nodes: The mediastinum is otherwise unremarkable.  No mediastinal lymphadenopathy is seen. No pericardial effusion is identified. The thyroid gland is unremarkable. No axillary lymphadenopathy is seen. Lungs/Pleura: Patchy bibasilar atelectasis is noted. Trace left-sided pleural fluid is seen. No pneumothorax is identified. No masses are seen. Mild emphysematous change is noted bilaterally. Musculoskeletal: There is a comminuted and displaced fracture of the left humeral neck, as recently noted. A vague hematoma is seen tracking about the left shoulder and left axilla, measuring approximately 9.7 x 8.2 x 6.6 cm. There is chronic joint space loss at the glenohumeral joints bilaterally. There is asymmetric prominence of the left pectoralis musculature, thought to reflect mild intramuscular hemorrhage. Overlying soft tissue injury is noted tracking along the left lateral chest wall. There is diffuse asymmetric prominence of the musculature along the left lateral abdominal wall, with overlying fluid, likely reflecting soft tissue injury and mild hemorrhage. Mild bilateral gynecomastia is incidentally noted. CT ABDOMEN PELVIS FINDINGS Hepatobiliary: There is a diffusely nodular contour to the liver, compatible with hepatic cirrhosis. No definite dominant mass is seen within the liver. Trace ascites is seen tracking about the liver. The portal venous system remains grossly patent. Stones are noted dependently within the gallbladder. The gallbladder  is otherwise grossly unremarkable. The common bile duct remains normal in caliber. Pancreas: The pancreas is within normal limits. Spleen: The spleen is enlarged, measuring 17.5 cm in length. Adrenals/Urinary Tract: The adrenal glands are unremarkable in appearance. The kidneys are within normal limits. There is no evidence of hydronephrosis. No renal or ureteral stones are identified. Nonspecific perinephric stranding is noted bilaterally. Stomach/Bowel: The stomach is unremarkable in  appearance. The small bowel is within normal limits. The appendix is normal in caliber, without evidence of appendicitis. The colon is unremarkable in appearance. Vascular/Lymphatic: Scattered calcification is seen along the abdominal aorta and its branches. The abdominal aorta is otherwise grossly unremarkable. The inferior vena cava is grossly unremarkable. No retroperitoneal lymphadenopathy is seen. No pelvic sidewall lymphadenopathy is identified. Reproductive: The bladder is mildly distended and grossly unremarkable. The prostate remains normal in size. Other: A small amount ascites is noted within the pelvis and along the paracolic gutters bilaterally. Musculoskeletal: No acute osseous abnormalities are identified. There is chronic loss of height involving the superior endplate of L2. Soft tissue edema is noted overlying the hips bilaterally. The visualized musculature is unremarkable in appearance. IMPRESSION: 1. Comminuted displaced fracture of the left humeral neck, with vague soft tissue hematoma tracking about the left shoulder and left axilla, measuring approximately 9.7 x 8.2 x 6.6 cm. 2. Asymmetric prominence of the left pectoralis musculature, thought to reflect mild intramuscular hemorrhage. Overlying soft tissue injury tracking along the left lateral chest wall. 3. Diffuse asymmetric prominence of the musculature along the left lateral abdominal wall, with overlying fluid, likely reflecting soft tissue injury and mild hemorrhage. 4. Findings of hepatic cirrhosis, with trace ascites noted within the abdomen and pelvis. 5. Splenomegaly. 6. Scattered aortic atherosclerosis. 7. Scattered coronary artery calcifications seen. 8. Patchy bibasilar atelectasis noted. Trace left pleural effusion. 9. Cholelithiasis. Gallbladder otherwise grossly unremarkable. 10. Chronic loss of height involving the superior endplate of L2. 11. Soft tissue edema overlying the hips  bilaterally. Electronically Signed By: Garald Balding M.D. On: 06/27/2017 21:46  CT Chest w Contrast CLINICAL DATA: Acute onset of upper back pain. Decreased hemoglobin. Assess for underlying hemorrhage. Initial encounter. EXAM: CT CHEST, ABDOMEN, AND PELVIS WITH CONTRAST TECHNIQUE: Multidetector CT imaging of the chest, abdomen and pelvis was performed following the standard protocol during bolus administration of intravenous contrast. CONTRAST: 162m ISOVUE-300 IOPAMIDOL (ISOVUE-300) INJECTION 61% COMPARISON: CT of the abdomen performed 09/10/2016, and chest radiograph performed 05/17/2017. CT of the left shoulder performed 06/26/2017 FINDINGS: CT CHEST FINDINGS Cardiovascular: Scattered coronary artery calcifications are seen. The heart remains normal in size. Mild calcification is noted along the thoracic aorta. The proximal great vessels are grossly unremarkable. Mediastinum/Nodes: The mediastinum is otherwise unremarkable. No mediastinal lymphadenopathy is seen. No pericardial effusion is identified. The thyroid gland is unremarkable. No axillary lymphadenopathy is seen. Lungs/Pleura: Patchy bibasilar atelectasis is noted. Trace left-sided pleural fluid is seen. No pneumothorax is identified. No masses are seen. Mild emphysematous change is noted bilaterally. Musculoskeletal: There is a comminuted and displaced fracture of the left humeral neck, as recently noted. A vague hematoma is seen tracking about the left shoulder and left axilla, measuring approximately 9.7 x 8.2 x 6.6 cm. There is chronic joint space loss at the glenohumeral joints bilaterally. There is asymmetric prominence of the left pectoralis musculature, thought to reflect mild intramuscular hemorrhage. Overlying soft tissue injury is noted tracking along the left lateral chest wall. There is diffuse asymmetric prominence of the musculature along the left lateral abdominal wall, with overlying  fluid, likely reflecting soft tissue injury and mild hemorrhage. Mild bilateral gynecomastia is incidentally noted. CT ABDOMEN PELVIS FINDINGS Hepatobiliary: There is a diffusely nodular contour to the liver, compatible with hepatic cirrhosis. No definite dominant mass is seen within the liver. Trace ascites is seen tracking about the liver. The portal venous system remains grossly patent. Stones are noted dependently within the gallbladder. The gallbladder is otherwise grossly unremarkable. The common bile duct remains normal in caliber. Pancreas: The pancreas is within normal limits. Spleen: The spleen is enlarged, measuring 17.5 cm in length. Adrenals/Urinary Tract: The adrenal glands are unremarkable in appearance. The kidneys are within normal limits. There is no evidence of hydronephrosis. No renal or ureteral stones are identified. Nonspecific perinephric stranding is noted bilaterally. Stomach/Bowel: The stomach is unremarkable in appearance. The small bowel is within normal limits. The appendix is normal in caliber, without evidence of appendicitis. The colon is unremarkable in appearance. Vascular/Lymphatic: Scattered calcification is seen along the abdominal aorta and its branches. The abdominal aorta is otherwise grossly unremarkable. The inferior vena cava is grossly unremarkable. No retroperitoneal lymphadenopathy is seen. No pelvic sidewall lymphadenopathy is identified. Reproductive: The bladder is mildly distended and grossly unremarkable. The prostate remains normal in size. Other: A small amount ascites is noted within the pelvis and along the paracolic gutters bilaterally. Musculoskeletal: No acute osseous abnormalities are identified. There is chronic loss of height involving the superior endplate of L2. Soft tissue edema is noted overlying the hips bilaterally. The visualized musculature is unremarkable in appearance. IMPRESSION: 1. Comminuted displaced  fracture of the left humeral neck, with vague soft tissue hematoma tracking about the left shoulder and left axilla, measuring approximately 9.7 x 8.2 x 6.6 cm. 2. Asymmetric prominence of the left pectoralis musculature, thought to reflect mild intramuscular hemorrhage. Overlying soft tissue injury tracking along the left lateral chest wall. 3. Diffuse asymmetric prominence of the musculature along the left lateral abdominal wall, with overlying fluid, likely reflecting soft tissue injury and mild hemorrhage. 4. Findings of hepatic cirrhosis, with trace ascites noted within the abdomen and pelvis. 5. Splenomegaly. 6. Scattered aortic atherosclerosis. 7. Scattered coronary artery calcifications seen. 8. Patchy bibasilar atelectasis noted. Trace left pleural effusion. 9. Cholelithiasis. Gallbladder otherwise grossly unremarkable. 10. Chronic loss of height involving the superior endplate of L2. 11. Soft tissue edema overlying the hips bilaterally. Electronically Signed By: Garald Balding M.D. On: 06/27/2017 21:46  DG Shoulder Left CLINICAL DATA: [place] onto shoulder with pain EXAM: LEFT SHOULDER - 2+ VIEW COMPARISON: None. FINDINGS: Left AC joint is intact. The left lung apex is clear. Humeral head maintains its articulation with the glenoid, there is moderate to marked degenerative change. Markedly comminuted fracture involving the neck and proximal shaft of the humerus with multiple displaced bone fragments. A bowel 1/2 shaft diameter of displacement of the distal fracture fragment toward the midline with mild varus angulation. IMPRESSION: Markedly comminuted, and slightly displaced and angulated fracture involving the left humeral neck and proximal shaft. No dislocation of the humeral head. Electronically Signed By: Donavan Foil M.D. On: 06/24/2017 20:21  Caroline More, DO 06/29/2017, 11:36 AM PGY-1, [place] Intern pager: [phone],  text pages welcome

## 2017-06-29 NOTE — Progress Notes (Signed)
Physical Therapy Treatment Patient Details Name: William [name] MRN: [id number] DOB: 08-27-1961 Today's Date: 06/29/2017    History of Present Illness Pt is a 56 yo m ale admitted through ED on 06/24/17 following an alcohol related fall resulting in a left humeral fracture. Pt was diagnoised with symptomatic anemia. PMH significant for esophogeal varices, chronic anemia, alcholic cirrhosis, polydonal elevation, HTN, CKD3.     PT Comments    Pt progressing towards goals, however, continues to be limited by fatigue. Pt requiring min to mod A +2 for functional mobility this session secondary to decreased balance and strength. Per MD notes, possible OR tomorrow. Continue to recommend SNF given current deficits and limited mobility tolerance. Will continue to follow acutely to maximize functional mobility independence.    Follow Up Recommendations  SNF;Supervision/Assistance - 24 hour     Equipment Recommendations  None recommended by PT    Recommendations for Other Services       Precautions / Restrictions Precautions Precautions: Fall;Shoulder Type of Shoulder Precautions: NWB Shoulder Interventions: Shoulder sling/immobilizer Precaution Booklet Issued: No Precaution Comments: NWB LUE Restrictions Weight Bearing Restrictions: Yes LUE Weight Bearing: Non weight bearing    Mobility  Bed Mobility Overal bed mobility: Needs Assistance Bed Mobility: Supine to Sit     Supine to sit: Mod assist;HOB elevated;+2 for physical assistance     General bed mobility comments: Mod A +2 for trunk elevation and scooting hips to EOB.   Transfers Overall transfer level: Needs assistance Equipment used: 1 person hand held assist Transfers: Sit to/from Stand Sit to Stand: Mod assist;From elevated surface         General transfer comment: Mod A for lift assist. REquired use of elevated surface. Verbal cues for powering up through LE.   Ambulation/Gait Ambulation/Gait assistance: Min  assist;+2 safety/equipment Ambulation Distance (Feet): 20 Feet Assistive device: 1 person hand held assist Gait Pattern/deviations: Step-through pattern;Decreased step length - right;Decreased step length - left;Decreased dorsiflexion - right;Decreased dorsiflexion - left;Wide base of support;Drifts right/left;Shuffle Gait velocity: Decreased Gait velocity interpretation: Below normal speed for age/gender General Gait Details: Slow, unsteady gait. +2 assist for steadying and IV pole management. Verbal cues for increasing step height and length, however, pt not responding to cues.    Stairs            Wheelchair Mobility    Modified Rankin (Stroke Patients Only)       Balance Overall balance assessment: Needs assistance Sitting-balance support: Single extremity supported;Feet supported Sitting balance-Leahy Scale: Fair     Standing balance support: Single extremity supported Standing balance-Leahy Scale: Poor Standing balance comment: requires min - mod A for standing and UE support                             Cognition Arousal/Alertness: Awake/alert Behavior During Therapy: WFL for tasks assessed/performed Overall Cognitive Status: No family/caregiver present to determine baseline cognitive functioning                                 General Comments: Overall slow cognitive processing. Required extended time to respond to questions. Perseverated on not being clean after using bathroom, however, PT ensured he was clean.       Exercises      General Comments General comments (skin integrity, edema, etc.): Notified RN of clean up and of itching in bottom.  Pertinent Vitals/Pain Pain Assessment: Faces Faces Pain Scale: Hurts even more Pain Location: LUE with  mobility   (appears worse with wrist movement) Pain Descriptors / Indicators: Grimacing;Guarding Pain Intervention(s): Limited activity within patient's tolerance;Monitored during  session;Repositioned    Home Living                      Prior Function            PT Goals (current goals can now be found in the care plan section) Acute Rehab PT Goals Patient Stated Goal: none stated PT Goal Formulation: With patient Time For Goal Achievement: 07/09/17 Potential to Achieve Goals: Good Progress towards PT goals: Progressing toward goals    Frequency    Min 2X/week      PT Plan Current plan remains appropriate    Co-evaluation              AM-PAC PT "6 Clicks" Daily Activity  Outcome Measure  Difficulty turning over in bed (including adjusting bedclothes, sheets and blankets)?: Total Difficulty moving from lying on back to sitting on the side of the bed? : Total Difficulty sitting down on and standing up from a chair with arms (e.g., wheelchair, bedside commode, etc,.)?: Total Help needed moving to and from a bed to chair (including a wheelchair)?: A Little Help needed walking in hospital room?: A Little Help needed climbing 3-5 steps with a railing? : A Lot 6 Click Score: 11    End of Session Equipment Utilized During Treatment: Gait belt;Other (comment) (sling ) Activity Tolerance: Patient limited by fatigue;Patient limited by pain Patient left: in chair;with call bell/phone within reach;with chair alarm set Nurse Communication: Mobility status PT Visit Diagnosis: Unsteadiness on feet (R26.81);Muscle weakness (generalized) (M62.81);Difficulty in walking, not elsewhere classified (R26.2);Pain Pain - Right/Left: Left Pain - part of body: Arm     Time: 4270-6237 PT Time Calculation (min) (ACUTE ONLY): 26 min  Charges:  $Gait Training: 8-22 mins $Therapeutic Activity: 8-22 mins                    G Codes:       Leighton Ruff, PT, DPT  Acute Rehabilitation Services  Pager: [phone]    Rudean Hitt 06/29/2017, 4:00 PM

## 2017-06-30 ENCOUNTER — Inpatient Hospital Stay (HOSPITAL_COMMUNITY): Payer: Medicare PPO | Admitting: Anesthesiology

## 2017-06-30 ENCOUNTER — Encounter (HOSPITAL_COMMUNITY)
Admission: EM | Disposition: A | Discharge status: Skilled Nursing Facility | Payer: Self-pay | Source: Home / Self Care | Attending: Family Medicine

## 2017-06-30 ENCOUNTER — Inpatient Hospital Stay (HOSPITAL_COMMUNITY): Payer: Medicare PPO

## 2017-06-30 ENCOUNTER — Encounter (HOSPITAL_COMMUNITY): Payer: Self-pay | Admitting: Certified Registered Nurse Anesthetist

## 2017-06-30 DIAGNOSIS — S42202D Unspecified fracture of upper end of left humerus, subsequent encounter for fracture with routine healing: Secondary | ICD-10-CM

## 2017-06-30 DIAGNOSIS — M66822 Spontaneous rupture of other tendons, left upper arm: Secondary | ICD-10-CM

## 2017-06-30 DIAGNOSIS — S42202A Unspecified fracture of upper end of left humerus, initial encounter for closed fracture: Secondary | ICD-10-CM

## 2017-06-30 DIAGNOSIS — S42292A Other displaced fracture of upper end of left humerus, initial encounter for closed fracture: Secondary | ICD-10-CM

## 2017-06-30 DIAGNOSIS — W19XXXA Unspecified fall, initial encounter: Secondary | ICD-10-CM

## 2017-06-30 HISTORY — PX: ORIF HUMERUS FRACTURE: SHX2126

## 2017-06-30 LAB — BASIC METABOLIC PANEL
Anion gap: 4 — ABNORMAL LOW (ref 5–15)
BUN: 16 mg/dL (ref 6–20)
CHLORIDE: 109 mmol/L (ref 101–111)
CO2: 17 mmol/L — ABNORMAL LOW (ref 22–32)
CREATININE: 1.14 mg/dL (ref 0.61–1.24)
Calcium: 8.7 mg/dL — ABNORMAL LOW (ref 8.9–10.3)
GFR calc Af Amer: >60 mL/min (ref >60)
GFR calc non Af Amer: >60 mL/min (ref >60)
GLUCOSE: 85 mg/dL (ref 65–99)
POTASSIUM: 5.2 mmol/L — AB (ref 3.5–5.1)
Sodium: 130 mmol/L — ABNORMAL LOW (ref 135–145)

## 2017-06-30 LAB — URINALYSIS, ROUTINE W REFLEX MICROSCOPIC
BILIRUBIN URINE: NEGATIVE
GLUCOSE, UA: NEGATIVE mg/dL
KETONES UR: NEGATIVE mg/dL
LEUKOCYTES UA: NEGATIVE
NITRITE: NEGATIVE
PH: 6 (ref 5.0–8.0)
Protein, ur: NEGATIVE mg/dL
SPECIFIC GRAVITY, URINE: 1.013 (ref 1.005–1.030)

## 2017-06-30 LAB — PROTIME-INR
INR: 1.4
Prothrombin Time: 17.2 seconds — ABNORMAL HIGH (ref 11.4–15.2)

## 2017-06-30 LAB — PREPARE RBC (CROSSMATCH)

## 2017-06-30 LAB — CBC
HCT: 30.3 % — ABNORMAL LOW (ref 39.0–52.0)
HEMATOCRIT: 30.7 % — AB (ref 39.0–52.0)
Hemoglobin: 10.1 g/dL — ABNORMAL LOW (ref 13.0–17.0)
Hemoglobin: 10.2 g/dL — ABNORMAL LOW (ref 13.0–17.0)
MCH: 30.3 pg (ref 26.0–34.0)
MCH: 30.2 pg (ref 26.0–34.0)
MCHC: 32.9 g/dL (ref 30.0–36.0)
MCHC: 33.7 g/dL (ref 30.0–36.0)
MCV: 92.2 fL (ref 78.0–100.0)
MCV: 89.6 fL (ref 78.0–100.0)
PLATELETS: 56 10*3/uL — AB (ref 150–400)
PLATELETS: 67 10*3/uL — AB (ref 150–400)
RBC: 3.33 MIL/uL — ABNORMAL LOW (ref 4.22–5.81)
RBC: 3.38 MIL/uL — AB (ref 4.22–5.81)
RDW: 19.4 % — AB (ref 11.5–15.5)
RDW: 18.4 % — ABNORMAL HIGH (ref 11.5–15.5)
WBC: 1.8 10*3/uL — ABNORMAL LOW (ref 4.0–10.5)
WBC: 2.6 10*3/uL — ABNORMAL LOW (ref 4.0–10.5)

## 2017-06-30 LAB — POCT I-STAT 4, (NA,K, GLUC, HGB,HCT)
GLUCOSE: 70 mg/dL (ref 65–99)
HEMATOCRIT: 24 % — AB (ref 39.0–52.0)
Hemoglobin: 8.2 g/dL — ABNORMAL LOW (ref 13.0–17.0)
Potassium: 4.3 mmol/L (ref 3.5–5.1)
Sodium: 138 mmol/L (ref 135–145)

## 2017-06-30 LAB — FIBRINOGEN: FIBRINOGEN: 310 mg/dL (ref 210–475)

## 2017-06-30 LAB — APTT: APTT: 28 s (ref 24–36)

## 2017-06-30 LAB — SURGICAL PCR SCREEN
MRSA, PCR: NEGATIVE
Staphylococcus aureus: POSITIVE — AB

## 2017-06-30 LAB — TISSUE HYBRIDIZATION (BONE MARROW)-NCBH

## 2017-06-30 LAB — MAGNESIUM: Magnesium: 1.5 mg/dL — ABNORMAL LOW (ref 1.7–2.4)

## 2017-06-30 LAB — CHROMOSOME ANALYSIS, BONE MARROW

## 2017-06-30 LAB — GLUCOSE, CAPILLARY: Glucose-Capillary: 103 mg/dL — ABNORMAL HIGH (ref 65–99)

## 2017-06-30 LAB — PHOSPHORUS: PHOSPHORUS: 3.1 mg/dL (ref 2.5–4.6)

## 2017-06-30 SURGERY — OPEN REDUCTION INTERNAL FIXATION (ORIF) PROXIMAL HUMERUS FRACTURE
Anesthesia: General | Laterality: Left

## 2017-06-30 MED ORDER — METHOCARBAMOL 500 MG PO TABS
500.0000 mg | ORAL_TABLET | Freq: Four times a day (QID) | ORAL | Status: DC | PRN
  Administered 2017-07-01 – 2017-07-03 (×3): 500 mg via ORAL
  Filled 2017-06-30 (×3): qty 1

## 2017-06-30 MED ORDER — MEPERIDINE HCL 25 MG/ML IJ SOLN: 6.2500 mg | INTRAMUSCULAR | Status: DC | PRN

## 2017-06-30 MED ORDER — FUROSEMIDE 10 MG/ML IJ SOLN
INTRAMUSCULAR | Status: DC | PRN
  Administered 2017-06-30: 20 mg via INTRAMUSCULAR

## 2017-06-30 MED ORDER — FUROSEMIDE 10 MG/ML IJ SOLN
INTRAMUSCULAR | Status: AC
  Filled 2017-06-30: qty 4

## 2017-06-30 MED ORDER — MAGNESIUM SULFATE 2 GM/50ML IV SOLN
2.0000 g | Freq: Once | INTRAVENOUS | Status: AC
  Administered 2017-06-30: 2 g via INTRAVENOUS
  Filled 2017-06-30: qty 50

## 2017-06-30 MED ORDER — MIDAZOLAM HCL 2 MG/2ML IJ SOLN
INTRAMUSCULAR | Status: AC
  Administered 2017-06-30: 2 mg via INTRAVENOUS
  Filled 2017-06-30: qty 2

## 2017-06-30 MED ORDER — FENTANYL CITRATE (PF) 100 MCG/2ML IJ SOLN
100.0000 ug | Freq: Once | INTRAMUSCULAR | Status: AC
  Administered 2017-06-30: 100 ug via INTRAVENOUS
  Filled 2017-06-30: qty 2

## 2017-06-30 MED ORDER — PROPOFOL 10 MG/ML IV BOLUS
INTRAVENOUS | Status: AC
  Filled 2017-06-30: qty 20

## 2017-06-30 MED ORDER — MAGNESIUM CITRATE PO SOLN: 1.0000 | Freq: Once | ORAL | Status: DC | PRN

## 2017-06-30 MED ORDER — SODIUM CHLORIDE 0.9 % IV SOLN
2000.0000 mg | INTRAVENOUS | Status: DC
  Filled 2017-06-30: qty 20

## 2017-06-30 MED ORDER — ROCURONIUM BROMIDE 100 MG/10ML IV SOLN
INTRAVENOUS | Status: DC | PRN
  Administered 2017-06-30: 50 mg via INTRAVENOUS
  Administered 2017-06-30: 20 mg via INTRAVENOUS

## 2017-06-30 MED ORDER — SUGAMMADEX SODIUM 200 MG/2ML IV SOLN
INTRAVENOUS | Status: DC | PRN
  Administered 2017-06-30: 200 mg via INTRAVENOUS

## 2017-06-30 MED ORDER — LIDOCAINE HCL (CARDIAC) 20 MG/ML IV SOLN
INTRAVENOUS | Status: DC | PRN
  Administered 2017-06-30: 100 mg via INTRAVENOUS

## 2017-06-30 MED ORDER — BUPIVACAINE HCL (PF) 0.25 % IJ SOLN
INTRAMUSCULAR | Status: AC
  Filled 2017-06-30: qty 30

## 2017-06-30 MED ORDER — ACETAMINOPHEN 650 MG RE SUPP: 650.0000 mg | Freq: Four times a day (QID) | RECTAL | Status: DC | PRN

## 2017-06-30 MED ORDER — METOCLOPRAMIDE HCL 5 MG PO TABS
5.0000 mg | ORAL_TABLET | Freq: Three times a day (TID) | ORAL | Status: DC | PRN
  Administered 2017-07-02: 10 mg via ORAL
  Filled 2017-06-30 (×2): qty 2

## 2017-06-30 MED ORDER — DIPHENHYDRAMINE HCL 12.5 MG/5ML PO ELIX
25.0000 mg | ORAL_SOLUTION | ORAL | Status: DC | PRN
  Administered 2017-07-02 – 2017-07-03 (×2): 25 mg via ORAL
  Filled 2017-06-30 (×2): qty 10

## 2017-06-30 MED ORDER — PHENYLEPHRINE HCL 10 MG/ML IJ SOLN
INTRAVENOUS | Status: DC | PRN
  Administered 2017-06-30: 25 ug/min via INTRAVENOUS

## 2017-06-30 MED ORDER — CHLORHEXIDINE GLUCONATE CLOTH 2 % EX PADS
6.0000 | MEDICATED_PAD | Freq: Every day | CUTANEOUS | Status: DC
  Administered 2017-06-30 – 2017-07-03 (×3): 6 via TOPICAL

## 2017-06-30 MED ORDER — SORBITOL 70 % SOLN
30.0000 mL | Freq: Every day | Status: DC | PRN
  Filled 2017-06-30: qty 30

## 2017-06-30 MED ORDER — ONDANSETRON HCL 4 MG/2ML IJ SOLN: 4.0000 mg | Freq: Four times a day (QID) | INTRAMUSCULAR | Status: DC | PRN

## 2017-06-30 MED ORDER — ACETAMINOPHEN 325 MG PO TABS
650.0000 mg | ORAL_TABLET | Freq: Four times a day (QID) | ORAL | Status: DC | PRN
Start: 2017-06-30 — End: 2017-07-04
  Administered 2017-07-04: 650 mg via ORAL
  Filled 2017-06-30: qty 2

## 2017-06-30 MED ORDER — FENTANYL CITRATE (PF) 250 MCG/5ML IJ SOLN
INTRAMUSCULAR | Status: AC
  Filled 2017-06-30: qty 5

## 2017-06-30 MED ORDER — 0.9 % SODIUM CHLORIDE (POUR BTL) OPTIME
TOPICAL | Status: DC | PRN
  Administered 2017-06-30: 1000 mL

## 2017-06-30 MED ORDER — FENTANYL CITRATE (PF) 100 MCG/2ML IJ SOLN
INTRAMUSCULAR | Status: DC | PRN
  Administered 2017-06-30: 50 ug via INTRAVENOUS
  Administered 2017-06-30: 100 ug via INTRAVENOUS

## 2017-06-30 MED ORDER — SODIUM CHLORIDE 0.9 % IV SOLN: Freq: Once | INTRAVENOUS | Status: DC

## 2017-06-30 MED ORDER — ONDANSETRON HCL 4 MG PO TABS: 4.0000 mg | ORAL_TABLET | Freq: Four times a day (QID) | ORAL | Status: DC | PRN

## 2017-06-30 MED ORDER — ONDANSETRON HCL 4 MG/2ML IJ SOLN
INTRAMUSCULAR | Status: DC | PRN
  Administered 2017-06-30: 4 mg via INTRAVENOUS

## 2017-06-30 MED ORDER — BUPIVACAINE-EPINEPHRINE (PF) 0.5% -1:200000 IJ SOLN
INTRAMUSCULAR | Status: DC | PRN
  Administered 2017-06-30: 30 mL via PERINEURAL

## 2017-06-30 MED ORDER — FENTANYL CITRATE (PF) 100 MCG/2ML IJ SOLN
INTRAMUSCULAR | Status: AC
  Administered 2017-06-30: 100 ug via INTRAVENOUS
  Filled 2017-06-30: qty 2

## 2017-06-30 MED ORDER — LACTATED RINGERS IV SOLN
INTRAVENOUS | Status: DC
  Administered 2017-06-30: 13:00:00 via INTRAVENOUS

## 2017-06-30 MED ORDER — FENTANYL CITRATE (PF) 100 MCG/2ML IJ SOLN: 25.0000 ug | INTRAMUSCULAR | Status: DC | PRN

## 2017-06-30 MED ORDER — MUPIROCIN 2 % EX OINT
1.0000 "application " | TOPICAL_OINTMENT | Freq: Two times a day (BID) | CUTANEOUS | Status: DC
  Administered 2017-06-30 – 2017-07-04 (×9): 1 via NASAL
  Filled 2017-06-30 (×4): qty 22

## 2017-06-30 MED ORDER — CEFAZOLIN SODIUM-DEXTROSE 2-4 GM/100ML-% IV SOLN
2.0000 g | Freq: Four times a day (QID) | INTRAVENOUS | Status: AC
  Administered 2017-06-30 – 2017-07-01 (×3): 2 g via INTRAVENOUS
  Filled 2017-06-30 (×3): qty 100

## 2017-06-30 MED ORDER — METOCLOPRAMIDE HCL 5 MG/ML IJ SOLN: 10.0000 mg | Freq: Once | INTRAMUSCULAR | Status: DC | PRN

## 2017-06-30 MED ORDER — ALBUMIN HUMAN 5 % IV SOLN
INTRAVENOUS | Status: DC | PRN
  Administered 2017-06-30 (×2): via INTRAVENOUS

## 2017-06-30 MED ORDER — MIDAZOLAM HCL 2 MG/2ML IJ SOLN
2.0000 mg | Freq: Once | INTRAMUSCULAR | Status: AC
  Administered 2017-06-30: 2 mg via INTRAVENOUS
  Filled 2017-06-30: qty 2

## 2017-06-30 MED ORDER — SODIUM CHLORIDE 0.9 % IV SOLN
INTRAVENOUS | Status: DC
  Administered 2017-06-30 – 2017-07-01 (×4): via INTRAVENOUS

## 2017-06-30 MED ORDER — LACTATED RINGERS IV SOLN
INTRAVENOUS | Status: DC | PRN
  Administered 2017-06-30 (×2): via INTRAVENOUS

## 2017-06-30 MED ORDER — LACTATED RINGERS IV SOLN
INTRAVENOUS | Status: DC
  Administered 2017-06-30: 19:00:00 via INTRAVENOUS

## 2017-06-30 MED ORDER — TRANEXAMIC ACID 1000 MG/10ML IV SOLN
INTRAVENOUS | Status: AC | PRN
  Administered 2017-06-30: 2000 mg via TOPICAL

## 2017-06-30 MED ORDER — OXYCODONE HCL 5 MG PO TABS
5.0000 mg | ORAL_TABLET | ORAL | Status: DC | PRN
  Administered 2017-07-01 – 2017-07-02 (×4): 15 mg via ORAL
  Filled 2017-06-30 (×4): qty 3

## 2017-06-30 MED ORDER — PROPOFOL 10 MG/ML IV BOLUS
INTRAVENOUS | Status: DC | PRN
  Administered 2017-06-30: 200 mg via INTRAVENOUS

## 2017-06-30 MED ORDER — MIDAZOLAM HCL 2 MG/2ML IJ SOLN
INTRAMUSCULAR | Status: AC
  Filled 2017-06-30: qty 2

## 2017-06-30 MED ORDER — POLYETHYLENE GLYCOL 3350 17 G PO PACK
17.0000 g | PACK | Freq: Every day | ORAL | Status: DC | PRN
  Administered 2017-07-04: 17 g via ORAL
  Filled 2017-06-30: qty 1

## 2017-06-30 MED ORDER — DEXAMETHASONE SODIUM PHOSPHATE 4 MG/ML IJ SOLN
INTRAMUSCULAR | Status: DC | PRN
  Administered 2017-06-30: 8 mg via INTRAVENOUS

## 2017-06-30 MED ORDER — METHOCARBAMOL 1000 MG/10ML IJ SOLN
500.0000 mg | Freq: Four times a day (QID) | INTRAVENOUS | Status: DC | PRN
  Filled 2017-06-30: qty 5

## 2017-06-30 MED ORDER — METOCLOPRAMIDE HCL 5 MG/ML IJ SOLN: 5.0000 mg | Freq: Three times a day (TID) | INTRAMUSCULAR | Status: DC | PRN

## 2017-06-30 MED ORDER — MORPHINE SULFATE (PF) 2 MG/ML IV SOLN
1.0000 mg | INTRAVENOUS | Status: DC | PRN
  Administered 2017-07-01 – 2017-07-02 (×2): 1 mg via INTRAVENOUS
  Filled 2017-06-30 (×2): qty 1

## 2017-06-30 SURGICAL SUPPLY — 74 items
BIT DRILL 3.2 (BIT) ×2
BIT DRILL 3.2XCALB NS DISP (BIT) ×1 IMPLANT
BIT DRILL CALIBRATED 2.7 (BIT) ×2 IMPLANT
BIT DRILL CALIBRATED 2.7MM (BIT) ×1
BIT DRL 3.2XCALB NS DISP (BIT) ×1
BLADE SURG 10 STRL SS (BLADE) ×6 IMPLANT
BNDG ESMARK 4X9 LF (GAUZE/BANDAGES/DRESSINGS) IMPLANT
CANISTER SUCT 3000ML (MISCELLANEOUS) ×6 IMPLANT
COVER SURGICAL LIGHT HANDLE (MISCELLANEOUS) ×3 IMPLANT
CUFF TOURNIQUET SINGLE 18IN (TOURNIQUET CUFF) ×3 IMPLANT
CUFF TOURNIQUET SINGLE 24IN (TOURNIQUET CUFF) IMPLANT
DRAPE C-ARM 42X72 X-RAY (DRAPES) ×3 IMPLANT
DRAPE IMP U-DRAPE 54X76 (DRAPES) ×3 IMPLANT
DRAPE INCISE IOBAN 66X45 STRL (DRAPES) ×3 IMPLANT
DRAPE U-SHAPE 47X51 STRL (DRAPES) ×3 IMPLANT
DRSG TEGADERM 4X4.75 (GAUZE/BANDAGES/DRESSINGS) ×3 IMPLANT
ELECT CAUTERY BLADE 6.4 (BLADE) ×3 IMPLANT
ELECT REM PT RETURN 9FT ADLT (ELECTROSURGICAL) ×3
ELECTRODE REM PT RTRN 9FT ADLT (ELECTROSURGICAL) ×1 IMPLANT
FACESHIELD WRAPAROUND (MASK) ×3 IMPLANT
GAUZE SPONGE 4X4 12PLY STRL (GAUZE/BANDAGES/DRESSINGS) ×3 IMPLANT
GAUZE SPONGE 4X4 12PLY STRL LF (GAUZE/BANDAGES/DRESSINGS) ×3 IMPLANT
GAUZE XEROFORM 5X9 LF (GAUZE/BANDAGES/DRESSINGS) ×3 IMPLANT
GLOVE BIO SURGEON STRL SZ 6.5 (GLOVE) ×4 IMPLANT
GLOVE BIO SURGEON STRL SZ7 (GLOVE) ×3 IMPLANT
GLOVE BIO SURGEONS STRL SZ 6.5 (GLOVE) ×2
GLOVE BIOGEL PI IND STRL 7.0 (GLOVE) ×2 IMPLANT
GLOVE BIOGEL PI INDICATOR 7.0 (GLOVE) ×4
GLOVE ECLIPSE 8.5 STRL (GLOVE) ×9 IMPLANT
GLOVE SKINSENSE NS SZ7.5 (GLOVE) ×4
GLOVE SKINSENSE STRL SZ7.5 (GLOVE) ×2 IMPLANT
GOWN STRL REIN XL XLG (GOWN DISPOSABLE) ×9 IMPLANT
IMMOBILIZER SHOULDER FOAM XLGE (SOFTGOODS) ×3 IMPLANT
K-WIRE 2X5 SS THRDED S3 (WIRE) ×6
KIT BASIN OR (CUSTOM PROCEDURE TRAY) ×3 IMPLANT
KIT ROOM TURNOVER OR (KITS) ×3 IMPLANT
KWIRE 2X5 SS THRDED S3 (WIRE) ×2 IMPLANT
MANIFOLD NEPTUNE II (INSTRUMENTS) ×3 IMPLANT
NS IRRIG 1000ML POUR BTL (IV SOLUTION) ×9 IMPLANT
PACK SHOULDER (CUSTOM PROCEDURE TRAY) ×3 IMPLANT
PACK UNIVERSAL I (CUSTOM PROCEDURE TRAY) ×3 IMPLANT
PAD ARMBOARD 7.5X6 YLW CONV (MISCELLANEOUS) ×3 IMPLANT
PAD CAST 4YDX4 CTTN HI CHSV (CAST SUPPLIES) ×1 IMPLANT
PADDING CAST COTTON 4X4 STRL (CAST SUPPLIES) ×2
PEG LOCKING 3.2MMX44 (Peg) ×3 IMPLANT
PEG LOCKING 3.2MMX54 (Peg) ×3 IMPLANT
PEG LOCKING 3.2MMX56 (Peg) ×3 IMPLANT
PEG LOCKING 3.2MMX65 (Peg) ×3 IMPLANT
PEG LOCKING 3.2X 60MM (Peg) ×3 IMPLANT
PEG LOCKING 3.2X48 (Peg) ×6 IMPLANT
PEG LOCKING 3.2X52 (Peg) ×3 IMPLANT
PLATE PROX HUM HI L 7H 140 (Plate) ×3 IMPLANT
PUTTY DBM STAGRAFT PLUS 10CC (Putty) ×3 IMPLANT
PUTTY GAMMA BSM 5CC (Putty) ×6 IMPLANT
SCREW CORTICAL LOW PROF 3.5X32 (Screw) ×6 IMPLANT
SCREW LOW PROFILE 3.5X30MM TIS (Screw) ×9 IMPLANT
SCREW T15 LP CORT 3.5X38MM NS (Screw) ×3 IMPLANT
SLEEVE MEASURING 3.2 (BIT) ×3 IMPLANT
SLING ARM IMMOBILIZER LRG (SOFTGOODS) ×3 IMPLANT
SPONGE LAP 18X18 X RAY DECT (DISPOSABLE) ×3 IMPLANT
STAPLER VISISTAT 35W (STAPLE) IMPLANT
SUCTION FRAZIER HANDLE 10FR (MISCELLANEOUS)
SUCTION TUBE FRAZIER 10FR DISP (MISCELLANEOUS) IMPLANT
SUT ETHILON 3 0 PS 1 (SUTURE) ×6 IMPLANT
SUT VIC AB 0 CT1 27 (SUTURE) ×4
SUT VIC AB 0 CT1 27XBRD ANBCTR (SUTURE) ×2 IMPLANT
SUT VIC AB 2-0 CT1 27 (SUTURE) ×4
SUT VIC AB 2-0 CT1 TAPERPNT 27 (SUTURE) ×2 IMPLANT
SYR CONTROL 10ML LL (SYRINGE) IMPLANT
TOWEL OR 17X24 6PK STRL BLUE (TOWEL DISPOSABLE) ×3 IMPLANT
TOWEL OR 17X26 10 PK STRL BLUE (TOWEL DISPOSABLE) ×3 IMPLANT
UNDERPAD 30X30 (UNDERPADS AND DIAPERS) ×3 IMPLANT
WATER STERILE IRR 1000ML POUR (IV SOLUTION) ×3 IMPLANT
YANKAUER SUCT BULB TIP NO VENT (SUCTIONS) ×3 IMPLANT

## 2017-06-30 NOTE — Anesthesia Procedure Notes (Signed)
Procedure Name: Intubation Date/Time: 06/30/2017 1:55 PM Performed by: Oletta Lamas Pre-anesthesia Checklist: Patient identified, Emergency Drugs available, Suction available and Patient being monitored Patient Re-evaluated:Patient Re-evaluated prior to inductionOxygen Delivery Method: Circle System Utilized Preoxygenation: Pre-oxygenation with 100% oxygen Intubation Type: IV induction Ventilation: Mask ventilation without difficulty Laryngoscope Size: Mac and 3 Grade View: Grade I Tube type: Oral Tube size: 7.5 mm Number of attempts: 1 Airway Equipment and Method: Stylet Placement Confirmation: ETT inserted through vocal cords under direct vision,  positive ETCO2 and breath sounds checked- equal and bilateral Secured at: 22 cm Tube secured with: Tape Dental Injury: Teeth and Oropharynx as per pre-operative assessment

## 2017-06-30 NOTE — Anesthesia Postprocedure Evaluation (Signed)
Anesthesia Post Note  Patient: William [name]  Procedure(s) Performed: Procedure(s) (LRB): OPEN REDUCTION INTERNAL FIXATION (ORIF) PROXIMAL HUMERUS FRACTURE (Right)     Patient location during evaluation: PACU Anesthesia Type: General Level of consciousness: awake and alert and patient cooperative Pain management: pain level controlled Vital Signs Assessment: post-procedure vital signs reviewed and stable Respiratory status: spontaneous breathing and respiratory function stable Cardiovascular status: stable Anesthetic complications: no    Last Vitals:  Vitals:   06/30/17 1640 06/30/17 1700  BP: 113/74 114/78  Pulse: 100 96  Resp: (!) 21 19  Temp: 36.5 C     Last Pain:  Vitals:   06/30/17 1700  TempSrc:   PainSc: 0-No pain                 Lawson Mahone S

## 2017-06-30 NOTE — Transfer of Care (Signed)
Immediate Anesthesia Transfer of Care Note  Patient: William [name]  Procedure(s) Performed: Procedure(s): OPEN REDUCTION INTERNAL FIXATION (ORIF) PROXIMAL HUMERUS FRACTURE (Right)  Patient Location: PACU  Anesthesia Type:General  Level of Consciousness: awake, drowsy and patient cooperative  Airway & Oxygen Therapy: Patient Spontanous Breathing and Patient connected to nasal cannula oxygen  Post-op Assessment: Report given to RN and Post -op Vital signs reviewed and stable  Post vital signs: Reviewed and stable  Last Vitals:  Vitals:   06/30/17 1330 06/30/17 1335  BP: (!) 97/59   Pulse: 86 93  Resp: 19 16  Temp:      Last Pain:  Vitals:   06/30/17 1000  TempSrc:   PainSc: 0-No pain      Patients Stated Pain Goal: 3 (10/19/84 2778)  Complications: No apparent anesthesia complications

## 2017-06-30 NOTE — Care Management Note (Signed)
Case Management Note  Patient Details  Name: William [name] MRN: [id number] Date of Birth: 1961/03/21  Subjective/Objective:         Admitted  06/24/17 following an alcohol related fall resulting in a left humeral fracture / symptomatic anemia. Hx of esophogeal varices, chronic anemia, alcholic cirrhosis, polydonal elevation, HTN, CKD3.          William [name] (Daughter) William [name] ([phone]       PCP: Marjie Skiff  Action/Plan: 7/ 05/2017 - Pending surgery today on his left proximal humerus fracture. CM to f/u with disposition needs  Expected Discharge Date:  06/28/17               Expected Discharge Plan:  Skilled Nursing Facility  In-House Referral:  Clinical Social Work  Discharge planning Services  CM Consult  Post Acute Care Choice:    Choice offered to:     DME Arranged:    DME Agency:     HH Arranged:    [place] Agency:     Status of Service:  In process, will continue to follow  If discussed at Long Length of Stay Meetings, dates discussed:    Additional Comments:  Sharin Mons, RN 06/30/2017, 10:32 AM

## 2017-06-30 NOTE — Progress Notes (Signed)
Orthopedic Tech Progress Note Patient Details:  William [name] September 19, 1961 [id number]  Ortho Devices Type of Ortho Device: Abduction pillow Ortho Device/Splint Location: lue Ortho Device/Splint Interventions: Ordered, Application, Adjustment   [name] 06/30/2017, 3:45 PM

## 2017-06-30 NOTE — Progress Notes (Signed)
Family Medicine Teaching Service Daily Progress Note Intern Pager: [phone]  Patient name: William [name] Medical record number: [id number] Date of birth: 10-01-1961 Age: 56 y.o. Gender: male  Primary Care Provider: Marjie Skiff, MD Consultants: PT, Orthopedics  Code Status: Full   Pt Overview and Major Events to Date:  Admitted to [facility] on 06/25/17  Assessment and Plan:  1. Symptomatic anemia, pancytopenia Recently admitted with the same where patient had full work up including bone marrow biopsy, which was negative. Current HR 96. Patient received total of 10units PRBCs, with current Hemoglobin of 10.1 and Hct 30.7. Denies acute symptoms of bleeding. Patient denies blood in stool or coffee ground emesis. Patient reports blood in urine. FOBT negative in ED.CT of abdomen and pelvis with contrast on 06/27/17 showed vague hematoma measuring 9.7x8.2x6.6, and soft tissue injury and mild hemorrhage overlying abdominal wall, and mild intramuscular hemorrhage. Reticulocyte cound was 54.3 on 7/3. Patient states he feels better today. Patient denies dizziness, fatigue, or weakness. Possible cause of anemia is sequestration in liver or spleen.  -monitor H/H closely -repeat CBC at 6pm following ORIF procedure, consider transfusion if Hgb drops below 7.5 -appreciate ortho recommendations  -continue maintenance fluids with normal saline  -continue Protonix -UA   2. Left Humeral fracture [place] on Thursday night, comminuted fx involving neck and proximal shaft of humerus with multiple displaced bone fragments, displacement of distal fx toward midline with mild varus angulation. Splint applied by ortho tech on 06/25/17. Patient currently states pain of 7-8/10 in left arm but states it is well controlled with prn pain medication. Orthopedics has planned for ORIF surgery today at 1:45 pm.  -appreciate ortho recommendations -25 mcg fentanyl IV prn for severe pain   3. AKI CKD3. Current Cr  1.14 -Continue maintenace fluids  -Monitor CMP  4. Hyponatremia Na currently 130. Na was 128 on admission. Likely due to poor intake 2/2 alcoholism.  -Monitor CMP -Continue maintenance fluids   5. Hypokalemia - resolved K currently 5.2.K of 3.3 on admission  -d/c K repletion   6. Hypomagnesemia  Mg currently 1.5. Improved form 1.3 on 7/5. Mg on admission 1.0.  -continue 800 mg oral daily -2g Mg IV on 7/6 -continue to monitor   7. Hypophosphatemia-resolved Phosphorus currently 3.1. Improved from 2.3 on 7/4. -Replete Phosphorus with daily Phosphorus 500 mg  -continue to monitor  7. Alcoholic cirrhosis with esophageal varices  AST 120 on 7/1. AST 141 on admission. ALT 31 on 7/1. ALT 35 on admission. Total bili 9.4 on 7/1. Total bili 7.9 on admission. EtOH <5 on admission. Patient states last drink was on Thursday. Patient denies agitation, anxiety, dizziness, and tactile/visual disturbance. Patient had slight tremors on exam.  -continue to monitor on CIWA   FEN/GI: Protonix  PPx: SCD  Disposition: Patient is improving   Subjective:  Mr. William [name] is a 56 yo male presenting for anemia and fx of left humerus following a fall on 6/28.  Objective: Temp:  [98.4 F (36.9 C)-99.2 F (37.3 C)] 99 F (37.2 C) (07/06 0522) Pulse Rate:  [96-102] 96 (07/06 0522) Resp:  [16-18] 17 (07/06 0522) BP: (116-123)/(70-76) 116/70 (07/06 0522) SpO2:  [96 %-100 %] 100 % (07/06 0522) Physical Exam: General: NAD, awake and alert, sitting up in chair  Cardiovascular: RRR, no MRG, 2+ pulse in R radial, 1+ pulse in L radial  Respiratory: CTAB, no increased work of breathing  Abdomen: soft, non tender, non distended, bowel sounds heard in all 4 quadrants  Extremities: swelling  in left hand, decreased grip strength in left hand  Laboratory:  Recent Labs Lab 06/29/17 0731 06/29/17 1610 06/30/17 0530  WBC 2.2* 2.1* 1.8*  HGB 10.1* 10.7* 10.1*  HCT 29.9* 32.9* 30.7*  PLT 55* 60* 56*     Recent Labs Lab 06/24/17 2035 06/25/17 1143  06/28/17 0600 06/29/17 0731 06/30/17 0530  NA 128* 130*  < > 130* 133* 130*  K 3.3* 4.0  < > 4.6 5.0 5.2*  CL 97* 104  < > 106 108 109  CO2 20* 17*  < > 15* 17* 17*  BUN 12 16  < > 13 16 16   CREATININE 1.81* 1.50*  < > 1.16 0.99 1.14  CALCIUM 7.8* 7.4*  < > 7.6* 8.3* 8.7*  PROT 6.5 6.2*  --   --   --   --   BILITOT 7.9* 9.4*  --   --   --   --   ALKPHOS 108 98  --   --   --   --   ALT 35 31  --   --   --   --   AST 141* 120*  --   --   --   --   GLUCOSE 107* 135*  < > 96 86 85  < > = values in this interval not displayed.  Mg = 1.5 Phosphorus = 3.1  Imaging/Diagnostic Tests: CT Abdomen Pelvis w Contrast  CLINICAL DATA: Acute onset of upper back pain. Decreased hemoglobin. Assess for underlying hemorrhage. Initial encounter. EXAM: CT CHEST, ABDOMEN, AND PELVIS WITH CONTRAST TECHNIQUE: Multidetector CT imaging of the chest, abdomen and pelvis was performed following the standard protocol during bolus administration of intravenous contrast. CONTRAST: 1101m ISOVUE-300 IOPAMIDOL (ISOVUE-300) INJECTION 61% COMPARISON: CT of the abdomen performed 09/10/2016, and chest radiograph performed 05/17/2017. CT of the left shoulder performed 06/26/2017 FINDINGS: CT CHEST FINDINGS Cardiovascular: Scattered coronary artery calcifications are seen. The heart remains normal in size. Mild calcification is noted along the thoracic aorta. The proximal great vessels are grossly unremarkable. Mediastinum/Nodes: The mediastinum is otherwise unremarkable. No mediastinal lymphadenopathy is seen. No pericardial effusion is identified. The thyroid gland is unremarkable. No axillary lymphadenopathy is seen. Lungs/Pleura: Patchy bibasilar atelectasis is noted. Trace left-sided pleural fluid is seen. No pneumothorax is identified. No masses are seen. Mild emphysematous change is noted bilaterally. Musculoskeletal: There is a comminuted and  displaced fracture of the left humeral neck, as recently noted. A vague hematoma is seen tracking about the left shoulder and left axilla, measuring approximately 9.7 x 8.2 x 6.6 cm. There is chronic joint space loss at the glenohumeral joints bilaterally. There is asymmetric prominence of the left pectoralis musculature, thought to reflect mild intramuscular hemorrhage. Overlying soft tissue injury is noted tracking along the left lateral chest wall. There is diffuse asymmetric prominence of the musculature along the left lateral abdominal wall, with overlying fluid, likely reflecting soft tissue injury and mild hemorrhage. Mild bilateral gynecomastia is incidentally noted. CT ABDOMEN PELVIS FINDINGS Hepatobiliary: There is a diffusely nodular contour to the liver, compatible with hepatic cirrhosis. No definite dominant mass is seen within the liver. Trace ascites is seen tracking about the liver. The portal venous system remains grossly patent. Stones are noted dependently within the gallbladder. The gallbladder is otherwise grossly unremarkable. The common bile duct remains normal in caliber. Pancreas: The pancreas is within normal limits. Spleen: The spleen is enlarged, measuring 17.5 cm in length. Adrenals/Urinary Tract: The adrenal glands are unremarkable in appearance. The kidneys  are within normal limits. There is no evidence of hydronephrosis. No renal or ureteral stones are identified. Nonspecific perinephric stranding is noted bilaterally. Stomach/Bowel: The stomach is unremarkable in appearance. The small bowel is within normal limits. The appendix is normal in caliber, without evidence of appendicitis. The colon is unremarkable in appearance. Vascular/Lymphatic: Scattered calcification is seen along the abdominal aorta and its branches. The abdominal aorta is otherwise grossly unremarkable. The inferior vena cava is grossly unremarkable. No retroperitoneal  lymphadenopathy is seen. No pelvic sidewall lymphadenopathy is identified. Reproductive: The bladder is mildly distended and grossly unremarkable. The prostate remains normal in size. Other: A small amount ascites is noted within the pelvis and along the paracolic gutters bilaterally. Musculoskeletal: No acute osseous abnormalities are identified. There is chronic loss of height involving the superior endplate of L2. Soft tissue edema is noted overlying the hips bilaterally. The visualized musculature is unremarkable in appearance. IMPRESSION: 1. Comminuted displaced fracture of the left humeral neck, with vague soft tissue hematoma tracking about the left shoulder and left axilla, measuring approximately 9.7 x 8.2 x 6.6 cm. 2. Asymmetric prominence of the left pectoralis musculature, thought to reflect mild intramuscular hemorrhage. Overlying soft tissue injury tracking along the left lateral chest wall. 3. Diffuse asymmetric prominence of the musculature along the left lateral abdominal wall, with overlying fluid, likely reflecting soft tissue injury and mild hemorrhage. 4. Findings of hepatic cirrhosis, with trace ascites noted within the abdomen and pelvis. 5. Splenomegaly. 6. Scattered aortic atherosclerosis. 7. Scattered coronary artery calcifications seen. 8. Patchy bibasilar atelectasis noted. Trace left pleural effusion. 9. Cholelithiasis. Gallbladder otherwise grossly unremarkable. 10. Chronic loss of height involving the superior endplate of L2. 11. Soft tissue edema overlying the hips bilaterally. Electronically Signed By: Garald Balding M.D. On: 06/27/2017 21:46  CT Chest w Contrast CLINICAL DATA: Acute onset of upper back pain. Decreased hemoglobin. Assess for underlying hemorrhage. Initial encounter. EXAM: CT CHEST, ABDOMEN, AND PELVIS WITH CONTRAST TECHNIQUE: Multidetector CT imaging of the chest, abdomen and pelvis was performed following the standard  protocol during bolus administration of intravenous contrast. CONTRAST: 170m ISOVUE-300 IOPAMIDOL (ISOVUE-300) INJECTION 61% COMPARISON: CT of the abdomen performed 09/10/2016, and chest radiograph performed 05/17/2017. CT of the left shoulder performed 06/26/2017 FINDINGS: CT CHEST FINDINGS Cardiovascular: Scattered coronary artery calcifications are seen. The heart remains normal in size. Mild calcification is noted along the thoracic aorta. The proximal great vessels are grossly unremarkable. Mediastinum/Nodes: The mediastinum is otherwise unremarkable. No mediastinal lymphadenopathy is seen. No pericardial effusion is identified. The thyroid gland is unremarkable. No axillary lymphadenopathy is seen. Lungs/Pleura: Patchy bibasilar atelectasis is noted. Trace left-sided pleural fluid is seen. No pneumothorax is identified. No masses are seen. Mild emphysematous change is noted bilaterally. Musculoskeletal: There is a comminuted and displaced fracture of the left humeral neck, as recently noted. A vague hematoma is seen tracking about the left shoulder and left axilla, measuring approximately 9.7 x 8.2 x 6.6 cm. There is chronic joint space loss at the glenohumeral joints bilaterally. There is asymmetric prominence of the left pectoralis musculature, thought to reflect mild intramuscular hemorrhage. Overlying soft tissue injury is noted tracking along the left lateral chest wall. There is diffuse asymmetric prominence of the musculature along the left lateral abdominal wall, with overlying fluid, likely reflecting soft tissue injury and mild hemorrhage. Mild bilateral gynecomastia is incidentally noted. CT ABDOMEN PELVIS FINDINGS Hepatobiliary: There is a diffusely nodular contour to the liver, compatible with hepatic cirrhosis. No definite dominant mass is seen within  the liver. Trace ascites is seen tracking about the liver. The portal venous system remains grossly  patent. Stones are noted dependently within the gallbladder. The gallbladder is otherwise grossly unremarkable. The common bile duct remains normal in caliber. Pancreas: The pancreas is within normal limits. Spleen: The spleen is enlarged, measuring 17.5 cm in length. Adrenals/Urinary Tract: The adrenal glands are unremarkable in appearance. The kidneys are within normal limits. There is no evidence of hydronephrosis. No renal or ureteral stones are identified. Nonspecific perinephric stranding is noted bilaterally. Stomach/Bowel: The stomach is unremarkable in appearance. The small bowel is within normal limits. The appendix is normal in caliber, without evidence of appendicitis. The colon is unremarkable in appearance. Vascular/Lymphatic: Scattered calcification is seen along the abdominal aorta and its branches. The abdominal aorta is otherwise grossly unremarkable. The inferior vena cava is grossly unremarkable. No retroperitoneal lymphadenopathy is seen. No pelvic sidewall lymphadenopathy is identified. Reproductive: The bladder is mildly distended and grossly unremarkable. The prostate remains normal in size. Other: A small amount ascites is noted within the pelvis and along the paracolic gutters bilaterally. Musculoskeletal: No acute osseous abnormalities are identified. There is chronic loss of height involving the superior endplate of L2. Soft tissue edema is noted overlying the hips bilaterally. The visualized musculature is unremarkable in appearance. IMPRESSION: 1. Comminuted displaced fracture of the left humeral neck, with vague soft tissue hematoma tracking about the left shoulder and left axilla, measuring approximately 9.7 x 8.2 x 6.6 cm. 2. Asymmetric prominence of the left pectoralis musculature, thought to reflect mild intramuscular hemorrhage. Overlying soft tissue injury tracking along the left lateral chest wall. 3. Diffuse asymmetric prominence of the  musculature along the left lateral abdominal wall, with overlying fluid, likely reflecting soft tissue injury and mild hemorrhage. 4. Findings of hepatic cirrhosis, with trace ascites noted within the abdomen and pelvis. 5. Splenomegaly. 6. Scattered aortic atherosclerosis. 7. Scattered coronary artery calcifications seen. 8. Patchy bibasilar atelectasis noted. Trace left pleural effusion. 9. Cholelithiasis. Gallbladder otherwise grossly unremarkable. 10. Chronic loss of height involving the superior endplate of L2. 11. Soft tissue edema overlying the hips bilaterally. Electronically Signed By: Garald Balding M.D. On: 06/27/2017 21:46  DG Shoulder Left CLINICAL DATA: [place] onto shoulder with pain EXAM: LEFT SHOULDER - 2+ VIEW COMPARISON: None. FINDINGS: Left AC joint is intact. The left lung apex is clear. Humeral head maintains its articulation with the glenoid, there is moderate to marked degenerative change. Markedly comminuted fracture involving the neck and proximal shaft of the humerus with multiple displaced bone fragments. A bowel 1/2 shaft diameter of displacement of the distal fracture fragment toward the midline with mild varus angulation. IMPRESSION: Markedly comminuted, and slightly displaced and angulated fracture involving the left humeral neck and proximal shaft. No dislocation of the humeral head. Electronically Signed By: Donavan Foil M.D. On: 06/24/2017 20:21  Caroline More, DO 06/30/2017, 11:28 AM PGY-1, [facility] Intern pager: [phone], text pages welcome

## 2017-06-30 NOTE — Op Note (Signed)
   Date of Surgery: 06/30/2017  INDICATIONS: Mr. Meyer is a 56 y.o.-year-old male with a left proximal humerus fracture;  The patient did consent to the procedure after discussion of the risks and benefits.  PREOPERATIVE DIAGNOSIS: Left proximal humerus fracture  POSTOPERATIVE DIAGNOSIS: Same.  PROCEDURE:  1. Open reduction internal fixation of left proximal humerus fracture 2. Open biceps tenodesis  SURGEON: N. Eduard Roux, M.D.  ASSIST: Ky Barban, RNFA.  ANESTHESIA:  general  IV FLUIDS AND URINE: See anesthesia.  ESTIMATED BLOOD LOSS: See anesthesia record  IMPLANTS: Biomet  DRAINS: None  COMPLICATIONS: None.  DESCRIPTION OF PROCEDURE: The patient was brought to the operating room and placed supine on the operating table.  The patient had been signed prior to the procedure and this was documented. The patient had the anesthesia placed by the anesthesiologist.  A time-out was performed to confirm that this was the correct patient, site, side and location. The patient did receive antibiotics prior to the incision and was re-dosed during the procedure as needed at indicated intervals. The patient had the operative extremity prepped and draped in the standard surgical fashion.    A standard deltopectoral approach was utilized. Dissection was carried down to the biceps tendon. The bicipital groove was identified. The rotator interval was then released. Exposure was then obtained. The biceps tendon was severely shredded from the injury and the sharp bony fragments. I first tenotomized the biceps tendon which was later tenodesed to the humeral shaft in a subpectoralis fashion. Alignment and reduction of the fracture was then achieved using rotation and traction. The appropriate length plate was then placed just lateral to the bicipital groove using fluoroscopic guidance. Nonlocking screws were placed through the plate into the humeral shaft using standard AO technique. We then placed  locking pegs through the proximal portion of the plate into the humeral head. He did have significant bone loss in the proximal humerus portion. I used a combination of demineralized bone matrix with cancellus chips and calcium phosphate cement to supplement the repair and to fill the void. X-rays were taken to confirm appropriate placement of the hardware. The wound was then thoroughly irrigated. I then performed a subpectoralis biceps tenodesis. The surgical wound was then closed in layer fashion using 2-0 Vicryl and 3-0 nylon. Sterile dressings were applied. Patient was placed in a shoulder immobilizer with an abduction pillow. Patient tolerated the procedure well had no immediate complications.  POSTOPERATIVE PLAN: Patient will be admitted back to the hospitalist service. He will need to be placed in a skilled nursing facility for rehabilitation. I'll see him back in office in 2 weeks.  Azucena Cecil, MD [place] 8:04 PM

## 2017-06-30 NOTE — Progress Notes (Signed)
CSW tried to get re-authorization for SNF to cover patient if he needed to discharge over weekend, however, [facility] states that they will be unable to provide authorization until Monday after patient's surgery and PT/OT works with patient post-surgery.  Percell Locus Mordechai Matuszak LCSWA [phone]

## 2017-06-30 NOTE — Progress Notes (Signed)
OT Cancellation Note  Patient Details Name: William [name] MRN: [id number] DOB: [birth year]   Cancelled Treatment:    Reason Eval/Treat Not Completed: Other (comment). Surgery is planned for today. Will check back another day.  Dyan Labarbera 06/30/2017, 9:02 AM  Lesle Chris, OTR/L [phone] 06/30/2017

## 2017-06-30 NOTE — Anesthesia Preprocedure Evaluation (Signed)
Anesthesia Evaluation  Patient identified by MRN, date of birth, ID band Patient awake    Reviewed: Allergy & Precautions, NPO status , Patient's Chart, lab work & pertinent test results  Airway Mallampati: II  TM Distance: >3 FB Neck ROM: Full    Dental no notable dental hx.    Pulmonary neg pulmonary ROS,    Pulmonary exam normal breath sounds clear to auscultation       Cardiovascular hypertension, Pt. on medications Normal cardiovascular exam Rhythm:Regular Rate:Normal     Neuro/Psych negative neurological ROS  negative psych ROS   GI/Hepatic negative GI ROS, (+) Cirrhosis     substance abuse  alcohol use,   Endo/Other  Hypothyroidism   Renal/GU negative Renal ROS  negative genitourinary   Musculoskeletal negative musculoskeletal ROS (+)   Abdominal   Peds negative pediatric ROS (+)  Hematology negative hematology ROS (+)   Anesthesia Other Findings S/p 8 units PRBC and 2 units FFP  Reproductive/Obstetrics negative OB ROS                             Anesthesia Physical Anesthesia Plan  ASA: II  Anesthesia Plan: General   Post-op Pain Management: GA combined w/ Regional for post-op pain   Induction: Intravenous  PONV Risk Score and Plan: 2 and Ondansetron and Dexamethasone  Airway Management Planned:   Additional Equipment:   Intra-op Plan:   Post-operative Plan: Extubation in OR  Informed Consent: I have reviewed the patients History and Physical, chart, labs and discussed the procedure including the risks, benefits and alternatives for the proposed anesthesia with the patient or authorized representative who has indicated his/her understanding and acceptance.   Dental advisory given  Plan Discussed with: CRNA  Anesthesia Plan Comments:         Anesthesia Quick Evaluation

## 2017-06-30 NOTE — Anesthesia Procedure Notes (Addendum)
Anesthesia Regional Block: Supraclavicular block   Pre-Anesthetic Checklist: ,, timeout performed, Correct Patient, Correct Site, Correct Laterality, Correct Procedure, Correct Position, site marked, Risks and benefits discussed,  Surgical consent,  Pre-op evaluation,  At surgeon's request and post-op pain management  Laterality: Upper and Right  Prep: Maximum Sterile Barrier Precautions used, chloraprep       Needles:  Injection technique: Single-shot  Needle Type: Echogenic Stimulator Needle     Needle Length: 10cm      Additional Needles:   Procedures: ultrasound guided,,,,,,,,  Narrative:  Start time: 06/30/2017 1:14 PM End time: 06/30/2017 1:24 PM Injection made incrementally with aspirations every 5 mL.  Performed by: Personally  Anesthesiologist: Montez Hageman  Additional Notes: Risks, benefits and alternative to block explained extensively.  Patient tolerated procedure well, without complications.

## 2017-07-01 DIAGNOSIS — R58 Hemorrhage, not elsewhere classified: Secondary | ICD-10-CM

## 2017-07-01 DIAGNOSIS — Z419 Encounter for procedure for purposes other than remedying health state, unspecified: Secondary | ICD-10-CM

## 2017-07-01 DIAGNOSIS — T148XXA Other injury of unspecified body region, initial encounter: Secondary | ICD-10-CM

## 2017-07-01 LAB — TYPE AND SCREEN
ABO/RH(D): B POS
Antibody Screen: NEGATIVE
Unit division: 0
Unit division: 0

## 2017-07-01 LAB — COMPREHENSIVE METABOLIC PANEL
ALT: 23 U/L (ref 17–63)
AST: 78 U/L — ABNORMAL HIGH (ref 15–41)
Albumin: 2 g/dL — ABNORMAL LOW (ref 3.5–5.0)
Alkaline Phosphatase: 83 U/L (ref 38–126)
Anion gap: 7 (ref 5–15)
BILIRUBIN TOTAL: 5.5 mg/dL — AB (ref 0.3–1.2)
BUN: 19 mg/dL (ref 6–20)
CALCIUM: 8.6 mg/dL — AB (ref 8.9–10.3)
CHLORIDE: 109 mmol/L (ref 101–111)
CO2: 18 mmol/L — ABNORMAL LOW (ref 22–32)
CREATININE: 1.38 mg/dL — AB (ref 0.61–1.24)
GFR calc Af Amer: >60 mL/min (ref >60)
GFR, EST NON AFRICAN AMERICAN: 55 mL/min — AB (ref >60)
Glucose, Bld: 156 mg/dL — ABNORMAL HIGH (ref 65–99)
Potassium: 4.9 mmol/L (ref 3.5–5.1)
Sodium: 134 mmol/L — ABNORMAL LOW (ref 135–145)
TOTAL PROTEIN: 5.8 g/dL — AB (ref 6.5–8.1)

## 2017-07-01 LAB — BPAM PLATELET PHERESIS
BLOOD PRODUCT EXPIRATION DATE: 201807062359
Blood Product Expiration Date: 201807071508
ISSUE DATE / TIME: 201807061511
ISSUE DATE / TIME: 201807061511
UNIT TYPE AND RH: 6200
Unit Type and Rh: 5100

## 2017-07-01 LAB — BPAM RBC
BLOOD PRODUCT EXPIRATION DATE: 201808012359
Blood Product Expiration Date: 201808012359
ISSUE DATE / TIME: 201807061529
ISSUE DATE / TIME: 201807061529
UNIT TYPE AND RH: 7300
Unit Type and Rh: 7300

## 2017-07-01 LAB — PREPARE PLATELET PHERESIS
Unit division: 0
Unit division: 0

## 2017-07-01 LAB — CBC
HEMATOCRIT: 28.3 % — AB (ref 39.0–52.0)
Hemoglobin: 9.2 g/dL — ABNORMAL LOW (ref 13.0–17.0)
MCH: 29.2 pg (ref 26.0–34.0)
MCHC: 32.5 g/dL (ref 30.0–36.0)
MCV: 89.8 fL (ref 78.0–100.0)
Platelets: 58 10*3/uL — ABNORMAL LOW (ref 150–400)
RBC: 3.15 MIL/uL — ABNORMAL LOW (ref 4.22–5.81)
RDW: 18.8 % — AB (ref 11.5–15.5)
WBC: 2.3 10*3/uL — ABNORMAL LOW (ref 4.0–10.5)

## 2017-07-01 LAB — MAGNESIUM: Magnesium: 1.6 mg/dL — ABNORMAL LOW (ref 1.7–2.4)

## 2017-07-01 LAB — PHOSPHORUS: PHOSPHORUS: 4.4 mg/dL (ref 2.5–4.6)

## 2017-07-01 MED ORDER — MAGNESIUM OXIDE 400 (241.3 MG) MG PO TABS
800.0000 mg | ORAL_TABLET | Freq: Two times a day (BID) | ORAL | Status: DC
  Administered 2017-07-01 – 2017-07-04 (×6): 800 mg via ORAL
  Filled 2017-07-01 (×6): qty 2

## 2017-07-01 MED ORDER — PANTOPRAZOLE SODIUM 40 MG PO TBEC
40.0000 mg | DELAYED_RELEASE_TABLET | Freq: Two times a day (BID) | ORAL | Status: DC
  Administered 2017-07-01 – 2017-07-04 (×7): 40 mg via ORAL
  Filled 2017-07-01 (×7): qty 1

## 2017-07-01 MED ORDER — MAGNESIUM SULFATE 2 GM/50ML IV SOLN
2.0000 g | Freq: Once | INTRAVENOUS | Status: AC
  Administered 2017-07-01: 2 g via INTRAVENOUS
  Filled 2017-07-01: qty 50

## 2017-07-01 NOTE — Progress Notes (Signed)
Occupational Therapy Treatment Patient Details Name: William [name] MRN: [id number] DOB: 12-23-1961 Today's Date: 07/01/2017    History of present illness Pt is a 56 yo m ale admitted through ED on 06/24/17 following an alcohol related fall resulting in a left humeral fracture. Pt was diagnoised with symptomatic anemia. PMH significant for esophogeal varices, chronic anemia, alcholic cirrhosis, polydonal elevation, HTN, CKD3.    OT comments  This 57 yo male admitted and underwent above presents to acute OT making progress with transfers and starting LUE exercises today. He will continue to benefit from acute OT with follow up at SNF.   Follow Up Recommendations  SNF    Equipment Recommendations  Other (comment) (TBD at next venue)       Precautions / Restrictions Precautions Precautions: Fall;Shoulder Type of Shoulder Precautions: NWB Shoulder Interventions: Shoulder abduction pillow;At all times Precaution Comments: OK for elbow, wrist, forearm, and hand exercises; Abduction pillow at all times Restrictions Weight Bearing Restrictions: Yes LUE Weight Bearing: Non weight bearing       Mobility Bed Mobility               General bed mobility comments: Pt sitting on EOB with nursing in room when I entered  Transfers Overall transfer level: Needs assistance Equipment used: 1 person hand held assist Transfers: Sit to/from Stand;Stand Pivot Transfers Sit to Stand: Min assist;From elevated surface Stand pivot transfers: Min assist            Balance Overall balance assessment: Needs assistance;History of Falls Sitting-balance support: No upper extremity supported;Feet supported Sitting balance-Leahy Scale: Good     Standing balance support: Single extremity supported Standing balance-Leahy Scale: Poor                             ADL either performed or assessed with clinical judgement   ADL Overall ADL's : Needs assistance/impaired          Upper Body Bathing: Moderate assistance;Sitting     Lower Body Bathing Details (indicate cue type and reason): Pt able to stand with min A from slightly raised bed and maintain standing while other staff A'd him with peri area cleaning         Toilet Transfer: Minimal assistance;Stand-pivot Toilet Transfer Details (indicate cue type and reason): bed>recliner                 Vision Patient Visual Report: No change from baseline            Cognition Arousal/Alertness: Awake/alert Behavior During Therapy: WFL for tasks assessed/performed Overall Cognitive Status: Within Functional Limits for tasks assessed                                          Exercises Other Exercises Other Exercises: Pt performed 10 reps of forearm supination/pronation, wrist flexion/extension, composite digit flexion/extension, composite digt flexion and finger manipulation with yellow putty, composite digit flexion with red tubing to squeeze. Encouraged pt to continue to use the tubing to squeeze on throughout the day to help with edema control           Pertinent Vitals/ Pain       Pain Assessment: Faces Faces Pain Scale: Hurts little more Pain Location: LUE Pain Descriptors / Indicators: Guarding;Sore Pain Intervention(s): Limited activity within patient's tolerance;Repositioned;RN gave pain meds during session;Ice applied  Frequency  Min 2X/week        Progress Toward Goals  OT Goals(current goals can now be found in the care plan section)  Progress towards OT goals: Progressing toward goals     Plan Discharge plan remains appropriate       AM-PAC PT "6 Clicks" Daily Activity     Outcome Measure   Help from another person eating meals?: A Little Help from another person taking care of personal grooming?: A Lot Help from another person toileting, which includes using toliet, bedpan, or urinal?: A Lot Help from another person bathing (including  washing, rinsing, drying)?: A Lot Help from another person to put on and taking off regular upper body clothing?: A Lot Help from another person to put on and taking off regular lower body clothing?: Total 6 Click Score: 12    End of Session Equipment Utilized During Treatment:  (abduction pillow)  OT Visit Diagnosis: Unsteadiness on feet (R26.81);Pain;History of falling (Z91.81) Pain - Right/Left: Left Pain - part of body: Shoulder   Activity Tolerance Patient tolerated treatment well   Patient Left in chair;with call bell/phone within reach;with chair alarm set   Nurse Communication  (RN and NT in room when I assisted him from bed to recliner)        Time: 1100-1134 OT Time Calculation (min): 34 min  Charges: OT General Charges $OT Visit: 1 Procedure OT Treatments $Self Care/Home Management : 8-22 mins $Therapeutic Exercise: 8-22 mins  [address], OTR/L [phone] 07/01/2017

## 2017-07-01 NOTE — Progress Notes (Signed)
Subjective: 1 Day Post-Op Procedure(s) (LRB): OPEN REDUCTION INTERNAL FIXATION (ORIF) PROXIMAL HUMERUS FRACTURE (Right) Patient reports pain as moderate.    Objective: Vital signs in last 24 hours: Temp:  [97.5 F (36.4 C)-98.2 F (36.8 C)] 97.9 F (36.6 C) (07/07 0800) Pulse Rate:  [83-100] 86 (07/07 0800) Resp:  [14-21] 18 (07/07 0800) BP: (94-125)/(54-85) 98/54 (07/07 0800) SpO2:  [97 %-100 %] 97 % (07/07 0800)  Intake/Output from previous day: 07/06 0701 - 07/07 0700 In: 5097.9 [I.V.:3187.9; Blood:1210; IV Piggyback:700] Out: 2810 [Urine:1510; Blood:1300] Intake/Output this shift: Total I/O In: -  Out: 500 [Urine:500]   Recent Labs  06/29/17 1610 06/30/17 0530 06/30/17 1456 06/30/17 1741 07/01/17 0306  HGB 10.7* 10.1* 8.2* 10.2* 9.2*    Recent Labs  06/30/17 1741 07/01/17 0306  WBC 2.6* 2.3*  RBC 3.38* 3.15*  HCT 30.3* 28.3*  PLT 67* 58*    Recent Labs  06/30/17 0530 06/30/17 1456 07/01/17 0306  NA 130* 138 134*  K 5.2* 4.3 4.9  CL 109  --  109  CO2 17*  --  18*  BUN 16  --  19  CREATININE 1.14  --  1.38*  GLUCOSE 85 70 156*  CALCIUM 8.7*  --  8.6*    Recent Labs  06/30/17 1741  INR 1.40    Neurologically intact Compartment soft  Assessment/Plan: 1 Day Post-Op Procedure(s) (LRB): OPEN REDUCTION INTERNAL FIXATION (ORIF) PROXIMAL HUMERUS FRACTURE (Right) Advance diet OOB with shoulder immobilizer Vonna Kotyk Hannalee Castor 07/01/2017, 10:28 AM

## 2017-07-01 NOTE — Progress Notes (Signed)
Family Medicine Teaching Service Daily Progress Note Intern Pager: [phone]  Patient name: William [name] Medical record number: [id number] Date of birth: November 06, 1961 Age: 56 y.o. Gender: male  Primary Care Provider: Marjie Skiff, MD Consultants: PT, Orthopedics  Code Status: Full   Pt Overview and Major Events to Date:  Admitted to [facility] on 06/25/17 ORIF L prox humerus fx 7/6  Assessment and Plan:  1. Symptomatic anemia, pancytopenia.  Improved on CBC this AM with Hgb 9.2.  Post op Hgb checked at [facility] last night and stable at 10.2 so did not require any additional transfusion.   -s/p 10 U pRBCs and 1 FFP.  Possible cause of anemia is sequestration in liver or spleen.  -monitor H/H closely -continue maintenance fluids with normal saline  -continue Protonix -UA   2. S/p ORIF left prox humerus fracture.  POD#1 with moderate amount of pain.    -abduction pillow  -Follow up Ortho recs for pain control  -Will need SNF placement  -25 mcg fentanyl IV prn for severe pain  -PT/OT recs  3. AKI CKD3. SCr this AM 1.38.  -Continue IVF @ 125 cc/hr.  -Will KVO once eating well  -Monitor CMP  4. Hyponatremia.  Improving.  Na currently 134 from 128 on admission. Likely due to poor intake 2/2 alcoholism.  -Monitor CMP -Continue maintenance fluids   5. Hypokalemia - resolved. K currently 4.9. -Daily CMP   6. Hypomagnesemia  Mg currently 1.6 improving from 1.0 on admission.  -continue 800 mg oral daily -continue to monitor   7. Hypophosphatemia-resolved.  AM Phos 4.4.  -Replete as necessary -continue to monitor  7. Alcoholic cirrhosis with esophageal varices  AST 120 on 7/1. AST 141 on admission. ALT 31 on 7/1. ALT 35 on admission. Total bili 9.4 on 7/1. Total bili 7.9 on admission. EtOH <5 on admission. Patient states last drink was on Thursday. Patient denies agitation, anxiety, dizziness, and tactile/visual disturbance. Patient had slight tremors on exam.  -consider  discontinuing CIWA scoring as scores have remained 0 overnight.    FEN/GI: Regular diet, Protonix  PPx: SCD  Disposition: Likely SNF Monday   Subjective:  Pt reports a moderate amount of pain this morning.  Left arm is propped up on pillow.  Has started to tolerate po.   Objective: Temp:  [97.5 F (36.4 C)-98.2 F (36.8 C)] 97.7 F (36.5 C) (07/07 0529) Pulse Rate:  [83-100] 85 (07/07 0529) Resp:  [14-21] 18 (07/07 0529) BP: (94-125)/(57-85) 105/57 (07/07 0529) SpO2:  [99 %-100 %] 100 % (07/07 0321)   Physical Exam: General: NAD, laying in bed, no acute distress  Cardiovascular: RRR, no MRG, 2+ pulse in R radial, 1+ pulse in L radial  Respiratory: CTAB, no increased work of breathing  Abdomen: soft, non tender, non distended, bowel sounds heard in all 4 quadrants  Extremities: L arm with dressing and icepack overlying it, abduction pillow in place    Laboratory:  Recent Labs Lab 06/30/17 0530 06/30/17 1456 06/30/17 1741 07/01/17 0306  WBC 1.8*  --  2.6* 2.3*  HGB 10.1* 8.2* 10.2* 9.2*  HCT 30.7* 24.0* 30.3* 28.3*  PLT 56*  --  67* 58*    Recent Labs Lab 06/24/17 2035 06/25/17 1143  06/29/17 0731 06/30/17 0530 06/30/17 1456 07/01/17 0306  NA 128* 130*  < > 133* 130* 138 134*  K 3.3* 4.0  < > 5.0 5.2* 4.3 4.9  CL 97* 104  < > 108 109  --  109  CO2  20* 17*  < > 17* 17*  --  18*  BUN 12 16  < > 16 16  --  19  CREATININE 1.81* 1.50*  < > 0.99 1.14  --  1.38*  CALCIUM 7.8* 7.4*  < > 8.3* 8.7*  --  8.6*  PROT 6.5 6.2*  --   --   --   --  5.8*  BILITOT 7.9* 9.4*  --   --   --   --  5.5*  ALKPHOS 108 98  --   --   --   --  83  ALT 35 31  --   --   --   --  23  AST 141* 120*  --   --   --   --  78*  GLUCOSE 107* 135*  < > 86 85 70 156*  < > = values in this interval not displayed.  Mg = 1.5 Phosphorus = 3.1  Imaging/Diagnostic Tests: CT Abdomen Pelvis w Contrast  CLINICAL DATA: Acute onset of upper back pain. Decreased hemoglobin. Assess for underlying  hemorrhage. Initial encounter. EXAM: CT CHEST, ABDOMEN, AND PELVIS WITH CONTRAST TECHNIQUE: Multidetector CT imaging of the chest, abdomen and pelvis was performed following the standard protocol during bolus administration of intravenous contrast. CONTRAST: 121mL ISOVUE-300 IOPAMIDOL (ISOVUE-300) INJECTION 61% COMPARISON: CT of the abdomen performed 09/10/2016, and chest radiograph performed 05/17/2017. CT of the left shoulder performed 06/26/2017 FINDINGS: CT CHEST FINDINGS Cardiovascular: Scattered coronary artery calcifications are seen. The heart remains normal in size. Mild calcification is noted along the thoracic aorta. The proximal great vessels are grossly unremarkable. Mediastinum/Nodes: The mediastinum is otherwise unremarkable. No mediastinal lymphadenopathy is seen. No pericardial effusion is identified. The thyroid gland is unremarkable. No axillary lymphadenopathy is seen. Lungs/Pleura: Patchy bibasilar atelectasis is noted. Trace left-sided pleural fluid is seen. No pneumothorax is identified. No masses are seen. Mild emphysematous change is noted bilaterally. Musculoskeletal: There is a comminuted and displaced fracture of the left humeral neck, as recently noted. A vague hematoma is seen tracking about the left shoulder and left axilla, measuring approximately 9.7 x 8.2 x 6.6 cm. There is chronic joint space loss at the glenohumeral joints bilaterally. There is asymmetric prominence of the left pectoralis musculature, thought to reflect mild intramuscular hemorrhage. Overlying soft tissue injury is noted tracking along the left lateral chest wall. There is diffuse asymmetric prominence of the musculature along the left lateral abdominal wall, with overlying fluid, likely reflecting soft tissue injury and mild hemorrhage. Mild bilateral gynecomastia is incidentally noted. CT ABDOMEN PELVIS FINDINGS Hepatobiliary: There is a diffusely nodular contour to the  liver, compatible with hepatic cirrhosis. No definite dominant mass is seen within the liver. Trace ascites is seen tracking about the liver. The portal venous system remains grossly patent. Stones are noted dependently within the gallbladder. The gallbladder is otherwise grossly unremarkable. The common bile duct remains normal in caliber. Pancreas: The pancreas is within normal limits. Spleen: The spleen is enlarged, measuring 17.5 cm in length. Adrenals/Urinary Tract: The adrenal glands are unremarkable in appearance. The kidneys are within normal limits. There is no evidence of hydronephrosis. No renal or ureteral stones are identified. Nonspecific perinephric stranding is noted bilaterally. Stomach/Bowel: The stomach is unremarkable in appearance. The small bowel is within normal limits. The appendix is normal in caliber, without evidence of appendicitis. The colon is unremarkable in appearance. Vascular/Lymphatic: Scattered calcification is seen along the abdominal aorta and its branches. The abdominal aorta is otherwise grossly unremarkable.  The inferior vena cava is grossly unremarkable. No retroperitoneal lymphadenopathy is seen. No pelvic sidewall lymphadenopathy is identified. Reproductive: The bladder is mildly distended and grossly unremarkable. The prostate remains normal in size. Other: A small amount ascites is noted within the pelvis and along the paracolic gutters bilaterally. Musculoskeletal: No acute osseous abnormalities are identified. There is chronic loss of height involving the superior endplate of L2. Soft tissue edema is noted overlying the hips bilaterally. The visualized musculature is unremarkable in appearance. IMPRESSION: 1. Comminuted displaced fracture of the left humeral neck, with vague soft tissue hematoma tracking about the left shoulder and left axilla, measuring approximately 9.7 x 8.2 x 6.6 cm. 2. Asymmetric prominence of the left pectoralis  musculature, thought to reflect mild intramuscular hemorrhage. Overlying soft tissue injury tracking along the left lateral chest wall. 3. Diffuse asymmetric prominence of the musculature along the left lateral abdominal wall, with overlying fluid, likely reflecting soft tissue injury and mild hemorrhage. 4. Findings of hepatic cirrhosis, with trace ascites noted within the abdomen and pelvis. 5. Splenomegaly. 6. Scattered aortic atherosclerosis. 7. Scattered coronary artery calcifications seen. 8. Patchy bibasilar atelectasis noted. Trace left pleural effusion. 9. Cholelithiasis. Gallbladder otherwise grossly unremarkable. 10. Chronic loss of height involving the superior endplate of L2. 11. Soft tissue edema overlying the hips bilaterally. Electronically Signed By: Garald Balding M.D. On: 06/27/2017 21:46  CT Chest w Contrast CLINICAL DATA: Acute onset of upper back pain. Decreased hemoglobin. Assess for underlying hemorrhage. Initial encounter. EXAM: CT CHEST, ABDOMEN, AND PELVIS WITH CONTRAST TECHNIQUE: Multidetector CT imaging of the chest, abdomen and pelvis was performed following the standard protocol during bolus administration of intravenous contrast. CONTRAST: 131mL ISOVUE-300 IOPAMIDOL (ISOVUE-300) INJECTION 61% COMPARISON: CT of the abdomen performed 09/10/2016, and chest radiograph performed 05/17/2017. CT of the left shoulder performed 06/26/2017 FINDINGS: CT CHEST FINDINGS Cardiovascular: Scattered coronary artery calcifications are seen. The heart remains normal in size. Mild calcification is noted along the thoracic aorta. The proximal great vessels are grossly unremarkable. Mediastinum/Nodes: The mediastinum is otherwise unremarkable. No mediastinal lymphadenopathy is seen. No pericardial effusion is identified. The thyroid gland is unremarkable. No axillary lymphadenopathy is seen. Lungs/Pleura: Patchy bibasilar atelectasis is noted.  Trace left-sided pleural fluid is seen. No pneumothorax is identified. No masses are seen. Mild emphysematous change is noted bilaterally. Musculoskeletal: There is a comminuted and displaced fracture of the left humeral neck, as recently noted. A vague hematoma is seen tracking about the left shoulder and left axilla, measuring approximately 9.7 x 8.2 x 6.6 cm. There is chronic joint space loss at the glenohumeral joints bilaterally. There is asymmetric prominence of the left pectoralis musculature, thought to reflect mild intramuscular hemorrhage. Overlying soft tissue injury is noted tracking along the left lateral chest wall. There is diffuse asymmetric prominence of the musculature along the left lateral abdominal wall, with overlying fluid, likely reflecting soft tissue injury and mild hemorrhage. Mild bilateral gynecomastia is incidentally noted. CT ABDOMEN PELVIS FINDINGS Hepatobiliary: There is a diffusely nodular contour to the liver, compatible with hepatic cirrhosis. No definite dominant mass is seen within the liver. Trace ascites is seen tracking about the liver. The portal venous system remains grossly patent. Stones are noted dependently within the gallbladder. The gallbladder is otherwise grossly unremarkable. The common bile duct remains normal in caliber. Pancreas: The pancreas is within normal limits. Spleen: The spleen is enlarged, measuring 17.5 cm in length. Adrenals/Urinary Tract: The adrenal glands are unremarkable in appearance. The kidneys are within normal limits. There is  no evidence of hydronephrosis. No renal or ureteral stones are identified. Nonspecific perinephric stranding is noted bilaterally. Stomach/Bowel: The stomach is unremarkable in appearance. The small bowel is within normal limits. The appendix is normal in caliber, without evidence of appendicitis. The colon is unremarkable in appearance. Vascular/Lymphatic: Scattered calcification is  seen along the abdominal aorta and its branches. The abdominal aorta is otherwise grossly unremarkable. The inferior vena cava is grossly unremarkable. No retroperitoneal lymphadenopathy is seen. No pelvic sidewall lymphadenopathy is identified. Reproductive: The bladder is mildly distended and grossly unremarkable. The prostate remains normal in size. Other: A small amount ascites is noted within the pelvis and along the paracolic gutters bilaterally. Musculoskeletal: No acute osseous abnormalities are identified. There is chronic loss of height involving the superior endplate of L2. Soft tissue edema is noted overlying the hips bilaterally. The visualized musculature is unremarkable in appearance. IMPRESSION: 1. Comminuted displaced fracture of the left humeral neck, with vague soft tissue hematoma tracking about the left shoulder and left axilla, measuring approximately 9.7 x 8.2 x 6.6 cm. 2. Asymmetric prominence of the left pectoralis musculature, thought to reflect mild intramuscular hemorrhage. Overlying soft tissue injury tracking along the left lateral chest wall. 3. Diffuse asymmetric prominence of the musculature along the left lateral abdominal wall, with overlying fluid, likely reflecting soft tissue injury and mild hemorrhage. 4. Findings of hepatic cirrhosis, with trace ascites noted within the abdomen and pelvis. 5. Splenomegaly. 6. Scattered aortic atherosclerosis. 7. Scattered coronary artery calcifications seen. 8. Patchy bibasilar atelectasis noted. Trace left pleural effusion. 9. Cholelithiasis. Gallbladder otherwise grossly unremarkable. 10. Chronic loss of height involving the superior endplate of L2. 11. Soft tissue edema overlying the hips bilaterally. Electronically Signed By: Garald Balding M.D. On: 06/27/2017 21:46  DG Shoulder Left CLINICAL DATA: [place] onto shoulder with pain EXAM: LEFT SHOULDER - 2+ VIEW COMPARISON: None. FINDINGS: Left  AC joint is intact. The left lung apex is clear. Humeral head maintains its articulation with the glenoid, there is moderate to marked degenerative change. Markedly comminuted fracture involving the neck and proximal shaft of the humerus with multiple displaced bone fragments. A bowel 1/2 shaft diameter of displacement of the distal fracture fragment toward the midline with mild varus angulation. IMPRESSION: Markedly comminuted, and slightly displaced and angulated fracture involving the left humeral neck and proximal shaft. No dislocation of the humeral head. Electronically Signed By: Donavan Foil M.D. On: 06/24/2017 20:21  Lovenia Kim, MD 07/01/2017, 9:01 AM PGY-2, [place] Intern pager: [phone], text pages welcome

## 2017-07-01 NOTE — Progress Notes (Signed)
Orthopedic Tech Progress Note Patient Details:  William [name] 06/17/1961 [id number] Patient had shoulder surgery unable to use overhead frame. Patient ID: LEMARIO CHAIKIN, male   DOB: 1961-08-23, 56 y.o.   MRN: [id number]   Braulio Bosch 07/01/2017, 5:43 PM

## 2017-07-02 LAB — CBC
HCT: 29 % — ABNORMAL LOW (ref 39.0–52.0)
Hemoglobin: 9.4 g/dL — ABNORMAL LOW (ref 13.0–17.0)
MCH: 29.9 pg (ref 26.0–34.0)
MCHC: 32.4 g/dL (ref 30.0–36.0)
MCV: 92.4 fL (ref 78.0–100.0)
PLATELETS: 66 10*3/uL — AB (ref 150–400)
RBC: 3.14 MIL/uL — ABNORMAL LOW (ref 4.22–5.81)
RDW: 19.2 % — AB (ref 11.5–15.5)
WBC: 3 10*3/uL — AB (ref 4.0–10.5)

## 2017-07-02 LAB — BASIC METABOLIC PANEL
Anion gap: 4 — ABNORMAL LOW (ref 5–15)
BUN: 21 mg/dL — AB (ref 6–20)
CO2: 19 mmol/L — ABNORMAL LOW (ref 22–32)
CREATININE: 1.22 mg/dL (ref 0.61–1.24)
Calcium: 8.8 mg/dL — ABNORMAL LOW (ref 8.9–10.3)
Chloride: 109 mmol/L (ref 101–111)
GFR calc Af Amer: >60 mL/min (ref >60)
Glucose, Bld: 111 mg/dL — ABNORMAL HIGH (ref 65–99)
Potassium: 4.4 mmol/L (ref 3.5–5.1)
SODIUM: 132 mmol/L — AB (ref 135–145)

## 2017-07-02 MED ORDER — OXYCODONE HCL 5 MG PO TABS: 5.0000 mg | ORAL_TABLET | ORAL | Status: DC | PRN

## 2017-07-02 MED ORDER — MAGNESIUM SULFATE 2 GM/50ML IV SOLN
2.0000 g | Freq: Once | INTRAVENOUS | Status: AC
  Administered 2017-07-02: 2 g via INTRAVENOUS
  Filled 2017-07-02: qty 50

## 2017-07-02 MED ORDER — OXYCODONE HCL 5 MG PO TABS
5.0000 mg | ORAL_TABLET | ORAL | Status: DC | PRN
  Administered 2017-07-02: 10 mg via ORAL
  Filled 2017-07-02: qty 2

## 2017-07-02 MED ORDER — OXYCODONE HCL 5 MG PO TABS
5.0000 mg | ORAL_TABLET | Freq: Four times a day (QID) | ORAL | Status: DC | PRN
  Administered 2017-07-02: 10 mg via ORAL
  Administered 2017-07-03: 5 mg via ORAL
  Filled 2017-07-02 (×2): qty 2

## 2017-07-02 NOTE — Progress Notes (Signed)
Family Medicine Teaching Service Daily Progress Note Intern Pager: [phone]  Patient name: William [name] Medical record number: [id number] Date of birth: 10-25-1961 Age: 56 y.o. Gender: male  Primary Care Provider: Marjie Skiff, MD Consultants: PT, Orthopedics  Code Status: Full   Pt Overview and Major Events to Date:  Admitted to [facility] on 06/25/17 ORIF L prox humerus fx 7/6  Assessment and Plan:  1. Symptomatic anemia, pancytopenia. Improved, stable. CBC 9.4 < 9.2 < 10.2 (post op) < 8.2.   -s/p 10 U pRBCs and 1 FFP.  Possible cause of anemia is sequestration in liver or spleen.  -monitor H/H closely -VSS and PO intake improved, will trial off IVFs -continue Protonix -UA   2. S/p ORIF left prox humerus fracture.  POD#2 with improved in pain but now fairly groggy.    -abduction pillow  -Will need SNF placement  -d/c IV fentany and IV morphine and continue PO oxycodone but decreased to 1-2 tablets prn instead of 1-3, as consistently getting 15 mg and groggy this am  -PT/OT recs  3. AKI CKD3. SCr this AM 1.22.  -Will trial of IVFs as intake improved -Monitor I/Os -Monitor CMP  4. Hyponatremia.  Improving.  Na currently 134 from 128 on admission. Likely due to poor intake 2/2 alcoholism.  -Monitor CMP -Continue maintenance fluids   5. Hypokalemia - resolved. K currently 4.4. -Daily CMP   6. Hypomagnesemia  Mg 1.6 yesterday, improving from 1.0 on admission.  -continue 800 mg oral daily -continue to monitor   7. Hypophosphatemia-resolved.   -Replete as necessary -continue to monitor  7. Alcoholic cirrhosis with esophageal varices  AST 120 on 7/1. AST 141 on admission. ALT 31 on 7/1. ALT 35 on admission. Total bili 9.4 on 7/1. Total bili 7.9 on admission. EtOH <5 on admission. Patient states last drink was on Thursday. Patient denies agitation, anxiety, dizziness, and tactile/visual disturbance. Patient had slight tremors on exam.  -d/c'ed CIWA protocol 7/7  for persistent scores of 0.    FEN/GI: Regular diet, Protonix  PPx: SCD  Disposition: Likely SNF Monday   Subjective:  Patient denies pain this morning. He says swelling in hands is much improved. His sister notes he seems somewhat agitated by itching. Patient says he is agreeable to going to SNF.   Objective: Temp:  [97.5 F (36.4 C)-98.3 F (36.8 C)] 97.9 F (36.6 C) (07/08 7408) Pulse Rate:  [90-101] 96 (07/08 0608) Resp:  [17-20] 17 (07/08 1448) BP: (114-127)/(74-87) 114/74 (07/08 0608) SpO2:  [100 %] 100 % (07/08 1856)   Physical Exam: General: NAD, sitting up in chair, no acute distress, falling asleep at times between answering questions  Cardiovascular: RRR, no MRG, 2+ pulse in R radial, 1+ pulse in L radial  Respiratory: CTAB, no increased work of breathing  Abdomen: soft, non tender, non distended, bowel sounds heard in all 4 quadrants  Extremities: L arm with dressing and icepack overlying it, abduction pillow in place. Moderate amount of swelling. Grip strength 5/5 bilaterally.     Laboratory:  Recent Labs Lab 06/30/17 1741 07/01/17 0306 07/02/17 0541  WBC 2.6* 2.3* 3.0*  HGB 10.2* 9.2* 9.4*  HCT 30.3* 28.3* 29.0*  PLT 67* 58* 66*    Recent Labs Lab 06/25/17 1143  06/30/17 0530 06/30/17 1456 07/01/17 0306 07/02/17 0541  NA 130*  < > 130* 138 134* 132*  K 4.0  < > 5.2* 4.3 4.9 4.4  CL 104  < > 109  --  109 109  CO2 17*  < > 17*  --  18* 19*  BUN 16  < > 16  --  19 21*  CREATININE 1.50*  < > 1.14  --  1.38* 1.22  CALCIUM 7.4*  < > 8.7*  --  8.6* 8.8*  PROT 6.2*  --   --   --  5.8*  --   BILITOT 9.4*  --   --   --  5.5*  --   ALKPHOS 98  --   --   --  83  --   ALT 31  --   --   --  23  --   AST 120*  --   --   --  78*  --   GLUCOSE 135*  < > 85 70 156* 111*  < > = values in this interval not displayed.  Mg = 1.5 Phosphorus = 3.1  Imaging/Diagnostic Tests: CT Abdomen Pelvis w Contrast  CLINICAL DATA: Acute onset of upper back pain.  Decreased hemoglobin. Assess for underlying hemorrhage. Initial encounter. EXAM: CT CHEST, ABDOMEN, AND PELVIS WITH CONTRAST TECHNIQUE: Multidetector CT imaging of the chest, abdomen and pelvis was performed following the standard protocol during bolus administration of intravenous contrast. CONTRAST: 16mL ISOVUE-300 IOPAMIDOL (ISOVUE-300) INJECTION 61% COMPARISON: CT of the abdomen performed 09/10/2016, and chest radiograph performed 05/17/2017. CT of the left shoulder performed 06/26/2017 FINDINGS: CT CHEST FINDINGS Cardiovascular: Scattered coronary artery calcifications are seen. The heart remains normal in size. Mild calcification is noted along the thoracic aorta. The proximal great vessels are grossly unremarkable. Mediastinum/Nodes: The mediastinum is otherwise unremarkable. No mediastinal lymphadenopathy is seen. No pericardial effusion is identified. The thyroid gland is unremarkable. No axillary lymphadenopathy is seen. Lungs/Pleura: Patchy bibasilar atelectasis is noted. Trace left-sided pleural fluid is seen. No pneumothorax is identified. No masses are seen. Mild emphysematous change is noted bilaterally. Musculoskeletal: There is a comminuted and displaced fracture of the left humeral neck, as recently noted. A vague hematoma is seen tracking about the left shoulder and left axilla, measuring approximately 9.7 x 8.2 x 6.6 cm. There is chronic joint space loss at the glenohumeral joints bilaterally. There is asymmetric prominence of the left pectoralis musculature, thought to reflect mild intramuscular hemorrhage. Overlying soft tissue injury is noted tracking along the left lateral chest wall. There is diffuse asymmetric prominence of the musculature along the left lateral abdominal wall, with overlying fluid, likely reflecting soft tissue injury and mild hemorrhage. Mild bilateral gynecomastia is incidentally noted. CT ABDOMEN PELVIS FINDINGS Hepatobiliary:  There is a diffusely nodular contour to the liver, compatible with hepatic cirrhosis. No definite dominant mass is seen within the liver. Trace ascites is seen tracking about the liver. The portal venous system remains grossly patent. Stones are noted dependently within the gallbladder. The gallbladder is otherwise grossly unremarkable. The common bile duct remains normal in caliber. Pancreas: The pancreas is within normal limits. Spleen: The spleen is enlarged, measuring 17.5 cm in length. Adrenals/Urinary Tract: The adrenal glands are unremarkable in appearance. The kidneys are within normal limits. There is no evidence of hydronephrosis. No renal or ureteral stones are identified. Nonspecific perinephric stranding is noted bilaterally. Stomach/Bowel: The stomach is unremarkable in appearance. The small bowel is within normal limits. The appendix is normal in caliber, without evidence of appendicitis. The colon is unremarkable in appearance. Vascular/Lymphatic: Scattered calcification is seen along the abdominal aorta and its branches. The abdominal aorta is otherwise grossly unremarkable. The inferior vena cava is grossly unremarkable. No retroperitoneal  lymphadenopathy is seen. No pelvic sidewall lymphadenopathy is identified. Reproductive: The bladder is mildly distended and grossly unremarkable. The prostate remains normal in size. Other: A small amount ascites is noted within the pelvis and along the paracolic gutters bilaterally. Musculoskeletal: No acute osseous abnormalities are identified. There is chronic loss of height involving the superior endplate of L2. Soft tissue edema is noted overlying the hips bilaterally. The visualized musculature is unremarkable in appearance. IMPRESSION: 1. Comminuted displaced fracture of the left humeral neck, with vague soft tissue hematoma tracking about the left shoulder and left axilla, measuring approximately 9.7 x 8.2 x 6.6 cm. 2.  Asymmetric prominence of the left pectoralis musculature, thought to reflect mild intramuscular hemorrhage. Overlying soft tissue injury tracking along the left lateral chest wall. 3. Diffuse asymmetric prominence of the musculature along the left lateral abdominal wall, with overlying fluid, likely reflecting soft tissue injury and mild hemorrhage. 4. Findings of hepatic cirrhosis, with trace ascites noted within the abdomen and pelvis. 5. Splenomegaly. 6. Scattered aortic atherosclerosis. 7. Scattered coronary artery calcifications seen. 8. Patchy bibasilar atelectasis noted. Trace left pleural effusion. 9. Cholelithiasis. Gallbladder otherwise grossly unremarkable. 10. Chronic loss of height involving the superior endplate of L2. 11. Soft tissue edema overlying the hips bilaterally. Electronically Signed By: Garald Balding M.D. On: 06/27/2017 21:46  CT Chest w Contrast CLINICAL DATA: Acute onset of upper back pain. Decreased hemoglobin. Assess for underlying hemorrhage. Initial encounter. EXAM: CT CHEST, ABDOMEN, AND PELVIS WITH CONTRAST TECHNIQUE: Multidetector CT imaging of the chest, abdomen and pelvis was performed following the standard protocol during bolus administration of intravenous contrast. CONTRAST: 157mL ISOVUE-300 IOPAMIDOL (ISOVUE-300) INJECTION 61% COMPARISON: CT of the abdomen performed 09/10/2016, and chest radiograph performed 05/17/2017. CT of the left shoulder performed 06/26/2017 FINDINGS: CT CHEST FINDINGS Cardiovascular: Scattered coronary artery calcifications are seen. The heart remains normal in size. Mild calcification is noted along the thoracic aorta. The proximal great vessels are grossly unremarkable. Mediastinum/Nodes: The mediastinum is otherwise unremarkable. No mediastinal lymphadenopathy is seen. No pericardial effusion is identified. The thyroid gland is unremarkable. No axillary lymphadenopathy is seen. Lungs/Pleura:  Patchy bibasilar atelectasis is noted. Trace left-sided pleural fluid is seen. No pneumothorax is identified. No masses are seen. Mild emphysematous change is noted bilaterally. Musculoskeletal: There is a comminuted and displaced fracture of the left humeral neck, as recently noted. A vague hematoma is seen tracking about the left shoulder and left axilla, measuring approximately 9.7 x 8.2 x 6.6 cm. There is chronic joint space loss at the glenohumeral joints bilaterally. There is asymmetric prominence of the left pectoralis musculature, thought to reflect mild intramuscular hemorrhage. Overlying soft tissue injury is noted tracking along the left lateral chest wall. There is diffuse asymmetric prominence of the musculature along the left lateral abdominal wall, with overlying fluid, likely reflecting soft tissue injury and mild hemorrhage. Mild bilateral gynecomastia is incidentally noted. CT ABDOMEN PELVIS FINDINGS Hepatobiliary: There is a diffusely nodular contour to the liver, compatible with hepatic cirrhosis. No definite dominant mass is seen within the liver. Trace ascites is seen tracking about the liver. The portal venous system remains grossly patent. Stones are noted dependently within the gallbladder. The gallbladder is otherwise grossly unremarkable. The common bile duct remains normal in caliber. Pancreas: The pancreas is within normal limits. Spleen: The spleen is enlarged, measuring 17.5 cm in length. Adrenals/Urinary Tract: The adrenal glands are unremarkable in appearance. The kidneys are within normal limits. There is no evidence of hydronephrosis. No renal or ureteral stones  are identified. Nonspecific perinephric stranding is noted bilaterally. Stomach/Bowel: The stomach is unremarkable in appearance. The small bowel is within normal limits. The appendix is normal in caliber, without evidence of appendicitis. The colon is unremarkable  in appearance. Vascular/Lymphatic: Scattered calcification is seen along the abdominal aorta and its branches. The abdominal aorta is otherwise grossly unremarkable. The inferior vena cava is grossly unremarkable. No retroperitoneal lymphadenopathy is seen. No pelvic sidewall lymphadenopathy is identified. Reproductive: The bladder is mildly distended and grossly unremarkable. The prostate remains normal in size. Other: A small amount ascites is noted within the pelvis and along the paracolic gutters bilaterally. Musculoskeletal: No acute osseous abnormalities are identified. There is chronic loss of height involving the superior endplate of L2. Soft tissue edema is noted overlying the hips bilaterally. The visualized musculature is unremarkable in appearance. IMPRESSION: 1. Comminuted displaced fracture of the left humeral neck, with vague soft tissue hematoma tracking about the left shoulder and left axilla, measuring approximately 9.7 x 8.2 x 6.6 cm. 2. Asymmetric prominence of the left pectoralis musculature, thought to reflect mild intramuscular hemorrhage. Overlying soft tissue injury tracking along the left lateral chest wall. 3. Diffuse asymmetric prominence of the musculature along the left lateral abdominal wall, with overlying fluid, likely reflecting soft tissue injury and mild hemorrhage. 4. Findings of hepatic cirrhosis, with trace ascites noted within the abdomen and pelvis. 5. Splenomegaly. 6. Scattered aortic atherosclerosis. 7. Scattered coronary artery calcifications seen. 8. Patchy bibasilar atelectasis noted. Trace left pleural effusion. 9. Cholelithiasis. Gallbladder otherwise grossly unremarkable. 10. Chronic loss of height involving the superior endplate of L2. 11. Soft tissue edema overlying the hips bilaterally. Electronically Signed By: Garald Balding M.D. On: 06/27/2017 21:46  DG Shoulder Left CLINICAL DATA: [place] onto shoulder with  pain EXAM: LEFT SHOULDER - 2+ VIEW COMPARISON: None. FINDINGS: Left AC joint is intact. The left lung apex is clear. Humeral head maintains its articulation with the glenoid, there is moderate to marked degenerative change. Markedly comminuted fracture involving the neck and proximal shaft of the humerus with multiple displaced bone fragments. A bowel 1/2 shaft diameter of displacement of the distal fracture fragment toward the midline with mild varus angulation. IMPRESSION: Markedly comminuted, and slightly displaced and angulated fracture involving the left humeral neck and proximal shaft. No dislocation of the humeral head. Electronically Signed By: Donavan Foil M.D. On: 06/24/2017 20:21  Rogue Bussing, MD 07/02/2017, 8:13 AM PGY-3, [facility] Intern pager: [phone], text pages welcome

## 2017-07-02 NOTE — Progress Notes (Signed)
Subjective: 2 Days Post-Op Procedure(s) (LRB): OPEN REDUCTION INTERNAL FIXATION (ORIF) PROXIMAL HUMERUS FRACTURE (Right) Patient reports pain as mild.    Objective: Vital signs in last 24 hours: Temp:  [97.5 F (36.4 C)-98.3 F (36.8 C)] 97.9 F (36.6 C) (07/08 9688) Pulse Rate:  [90-101] 96 (07/08 0608) Resp:  [17-20] 17 (07/08 0608) BP: (114-127)/(74-87) 114/74 (07/08 6484) SpO2:  [100 %] 100 % (07/08 7207)  Intake/Output from previous day: 07/07 0701 - 07/08 0700 In: 3335.8 [P.O.:240; I.V.:3095.8] Out: 1275 [Urine:1275] Intake/Output this shift: No intake/output data recorded.   Recent Labs  06/30/17 0530 06/30/17 1456 06/30/17 1741 07/01/17 0306 07/02/17 0541  HGB 10.1* 8.2* 10.2* 9.2* 9.4*    Recent Labs  07/01/17 0306 07/02/17 0541  WBC 2.3* 3.0*  RBC 3.15* 3.14*  HCT 28.3* 29.0*  PLT 58* 66*    Recent Labs  07/01/17 0306 07/02/17 0541  NA 134* 132*  K 4.9 4.4  CL 109 109  CO2 18* 19*  BUN 19 21*  CREATININE 1.38* 1.22  GLUCOSE 156* 111*  CALCIUM 8.6* 8.8*    Recent Labs  06/30/17 1741  INR 1.40    Neurologically intact No cellulitis present Swelling left hand less, dressing left shoulder dry Assessment/Plan: 2 Days Post-Op Procedure(s) (LRB): OPEN REDUCTION INTERNAL FIXATION (ORIF) PROXIMAL HUMERUS FRACTURE (Right) Up with therapy Stable from ortho standpoint-awaiting SNF Garald Balding 07/02/2017, 9:53 AM

## 2017-07-02 NOTE — Progress Notes (Signed)
Occupational Therapy Treatment Patient Details Name: William [name] MRN: [id number] DOB: [birth year] Today's Date: 07/02/2017    History of present illness Pt is a 56 yo m ale admitted through ED on 06/24/17 following an alcohol related fall resulting in a left humeral fracture. Now s/p R humerus ORIF. Pt was diagnoised with symptomatic anemia. PMH significant for esophogeal varices, chronic anemia, alcholic cirrhosis, polydonal elevation, HTN, CKD3.    OT comments  Pt progressing toward OT goals. He was quite lethargic this session falling asleep at times during education concerning home exercise program. Pt continues to demonstrate slow processing and did not recall previous visit or OT education from the previous day. Facilitated progression of elbow/forearm/wrist/hand AROM and educated pt on edema management strategies for L UE. He would continue to benefit from OT services while admitted to maximize independence with ADL and functional mobility. D/C recommendation remains appropriate.   Follow Up Recommendations  SNF    Equipment Recommendations  Other (comment) (TBD at next venue of care)    Recommendations for Other Services      Precautions / Restrictions Precautions Precautions: Fall;Shoulder Type of Shoulder Precautions: NWB Shoulder Interventions: Shoulder abduction pillow;At all times Precaution Booklet Issued: No Precaution Comments: OK for elbow, wrist, forearm, and hand exercises; Abduction pillow at all times Required Braces or Orthoses: Sling Restrictions Weight Bearing Restrictions: Yes LUE Weight Bearing: Non weight bearing       Mobility Bed Mobility               General bed mobility comments: Pt up in chair on OT arrival.  Transfers                      Balance Overall balance assessment: Needs assistance;History of Falls Sitting-balance support: No upper extremity supported;Feet supported Sitting balance-Leahy Scale: Good                                     ADL either performed or assessed with clinical judgement   ADL Overall ADL's : Needs assistance/impaired Eating/Feeding: Modified independent;Sitting Eating/Feeding Details (indicate cue type and reason): Increased time as pt is L handed and having to feed himself with his R hand.              Upper Body Dressing : Maximal assistance;Sitting                     General ADL Comments: Pt requiring VC's for maintaining NWB precautions L UE. Session focused on sling wear education as well as L elbow/forearm/wrist/hand exercises and edema control. Pt lethargic throughout session nearly falling asleep regularly.      Vision       Perception     Praxis      Cognition Arousal/Alertness: Lethargic;Suspect due to medications Behavior During Therapy: [facility] for tasks assessed/performed Overall Cognitive Status: No family/caregiver present to determine baseline cognitive functioning Area of Impairment: Attention;Memory;Problem solving                   Current Attention Level: Selective Memory: Decreased short-term memory       Problem Solving: Slow processing;Requires verbal cues General Comments: Overall slow processing requiring increased time to respond. Noted pt with confusion when following directions from therapist and with decreased short-term memory of topics previously discussed.        Exercises Other Exercises Other Exercises: Continued education concerning  10 repetitions of elbow flexion/extension, forearm supination/pronation, wrist flexion/extension, composite digit flexion and extension. Pt did not recall education completed previous day or visit from previous OT. Reinforced education concerning use of putty and tubing.    Shoulder Instructions       General Comments      Pertinent Vitals/ Pain       Pain Assessment: 0-10 Pain Score: 7  Pain Location: LUE Pain Descriptors / Indicators: Guarding;Sore Pain  Intervention(s): Limited activity within patient's tolerance;Monitored during session;Repositioned  Home Living                                          Prior Functioning/Environment              Frequency  Min 2X/week        Progress Toward Goals  OT Goals(current goals can now be found in the care plan section)  Progress towards OT goals: Progressing toward goals  Acute Rehab OT Goals Patient Stated Goal: none stated OT Goal Formulation: With patient Time For Goal Achievement: 07/10/17 Potential to Achieve Goals: Good  Plan Discharge plan remains appropriate    Co-evaluation                 AM-PAC PT "6 Clicks" Daily Activity     Outcome Measure   Help from another person eating meals?: None Help from another person taking care of personal grooming?: A Lot Help from another person toileting, which includes using toliet, bedpan, or urinal?: A Lot Help from another person bathing (including washing, rinsing, drying)?: A Lot Help from another person to put on and taking off regular upper body clothing?: A Lot Help from another person to put on and taking off regular lower body clothing?: Total 6 Click Score: 13    End of Session Equipment Utilized During Treatment:  (shoulder immobilizer with abduction pillow)  OT Visit Diagnosis: Unsteadiness on feet (R26.81);Pain;History of falling (Z91.81) Pain - Right/Left: Left Pain - part of body: Shoulder   Activity Tolerance Patient tolerated treatment well   Patient Left in chair;with call bell/phone within reach;with chair alarm set   Nurse Communication Mobility status        Time: 3016-0109 OT Time Calculation (min): 40 min  Charges: OT General Charges $OT Visit: 1 Procedure OT Treatments $Self Care/Home Management : 8-22 mins $Therapeutic Exercise: 23-37 mins  Norman Herrlich, MS OTR/L  Pager: [place] A [place] 07/02/2017, 11:29 AM

## 2017-07-03 ENCOUNTER — Encounter (HOSPITAL_COMMUNITY): Payer: Self-pay | Admitting: Orthopaedic Surgery

## 2017-07-03 DIAGNOSIS — T148XXA Other injury of unspecified body region, initial encounter: Secondary | ICD-10-CM

## 2017-07-03 DIAGNOSIS — R58 Hemorrhage, not elsewhere classified: Secondary | ICD-10-CM

## 2017-07-03 LAB — COMPREHENSIVE METABOLIC PANEL
ALT: 31 U/L (ref 17–63)
AST: 131 U/L — ABNORMAL HIGH (ref 15–41)
Albumin: 2.1 g/dL — ABNORMAL LOW (ref 3.5–5.0)
Alkaline Phosphatase: 107 U/L (ref 38–126)
Anion gap: 4 — ABNORMAL LOW (ref 5–15)
BUN: 28 mg/dL — ABNORMAL HIGH (ref 6–20)
CHLORIDE: 109 mmol/L (ref 101–111)
CO2: 20 mmol/L — AB (ref 22–32)
CREATININE: 1.33 mg/dL — AB (ref 0.61–1.24)
Calcium: 8.8 mg/dL — ABNORMAL LOW (ref 8.9–10.3)
GFR calc non Af Amer: 57 mL/min — ABNORMAL LOW (ref >60)
Glucose, Bld: 87 mg/dL (ref 65–99)
POTASSIUM: 4.5 mmol/L (ref 3.5–5.1)
SODIUM: 133 mmol/L — AB (ref 135–145)
Total Bilirubin: 4.9 mg/dL — ABNORMAL HIGH (ref 0.3–1.2)
Total Protein: 6.2 g/dL — ABNORMAL LOW (ref 6.5–8.1)

## 2017-07-03 LAB — CBC
HCT: 28 % — ABNORMAL LOW (ref 39.0–52.0)
HEMOGLOBIN: 8.9 g/dL — AB (ref 13.0–17.0)
MCH: 29.7 pg (ref 26.0–34.0)
MCHC: 31.8 g/dL (ref 30.0–36.0)
MCV: 93.3 fL (ref 78.0–100.0)
Platelets: 67 10*3/uL — ABNORMAL LOW (ref 150–400)
RBC: 3 MIL/uL — AB (ref 4.22–5.81)
RDW: 19.2 % — ABNORMAL HIGH (ref 11.5–15.5)
WBC: 2.8 10*3/uL — ABNORMAL LOW (ref 4.0–10.5)

## 2017-07-03 LAB — MAGNESIUM: MAGNESIUM: 1.9 mg/dL (ref 1.7–2.4)

## 2017-07-03 MED ORDER — MAGNESIUM OXIDE 400 (241.3 MG) MG PO TABS: 800.0000 mg | ORAL_TABLET | Freq: Two times a day (BID) | ORAL | 0 refills | Status: DC

## 2017-07-03 MED ORDER — OXYCODONE HCL 5 MG PO TABS: 5.0000 mg | ORAL_TABLET | Freq: Three times a day (TID) | ORAL | 0 refills | Status: AC | PRN

## 2017-07-03 MED ORDER — OXYCODONE HCL 5 MG PO TABS
5.0000 mg | ORAL_TABLET | Freq: Three times a day (TID) | ORAL | Status: DC | PRN
  Administered 2017-07-03: 5 mg via ORAL
  Filled 2017-07-03: qty 1

## 2017-07-03 NOTE — Discharge Instructions (Signed)
For pain, you can take 650mg  of Tylenol up to every 6 hours as needed. For severe pain, you can take one oxycodone 5mg  tablet up to every 8 hours as needed.   Please be sure to go to your orthopedics follow-up appointment in two weeks.   Also, please be sure to schedule an appointment with your primary doctor (Dr. Andy Gauss) at the [facility] within one week.   We also strongly recommend stopping drinking alcohol. Please call Alcoholics Anonymous at [phone] to set up your first meeting as soon as possible.

## 2017-07-03 NOTE — Progress Notes (Signed)
Occupational Therapy Treatment Patient Details Name: William [name] MRN: [id number] DOB: July 02, 1961 Today's Date: 07/03/2017    History of present illness Pt is a 56 yo male admitted through ED on 06/24/17 following an alcohol related fall resulting in a left humeral fracture. Now s/p L humerus ORIF. Pt was diagnoised with symptomatic anemia. PMH significant for esophogeal varices, chronic anemia, alcholic cirrhosis, polydonal elevation, HTN, CKD3.    OT comments  Pt performed ROM L elbow to hand in supine, up to chair with min-mod assist. Assist for change of gown and clean up following urinary incontinence. Pt continues to demonstrate impaired cognition. SNF remains appropriate. [employer] notified of need for new sling as current one is too small. [employer] to provide.   Follow Up Recommendations  SNF    Equipment Recommendations       Recommendations for Other Services      Precautions / Restrictions Precautions Precautions: Fall;Shoulder Type of Shoulder Precautions: NWB Shoulder Interventions: Shoulder abduction pillow;At all times Precaution Booklet Issued: No Precaution Comments: OK for elbow, wrist, forearm, and hand exercises; Abduction pillow at all times Required Braces or Orthoses: Sling Restrictions Weight Bearing Restrictions: Yes LUE Weight Bearing: Non weight bearing       Mobility Bed Mobility Overal bed mobility: Needs Assistance Bed Mobility: Supine to Sit     Supine to sit: Min assist     General bed mobility comments: assist to shift hips to EOB  Transfers Overall transfer level: Needs assistance Equipment used: 1 person hand held assist Transfers: Sit to/from [facility] Sit to Stand: Mod assist;From elevated surface Stand pivot transfers: Min assist       General transfer comment: mod assist to rise, min assist to steady as he transferred to chair with 2 pillows in the bottom    Balance Overall balance assessment: Needs  assistance;History of Falls Sitting-balance support: No upper extremity supported;Feet supported Sitting balance-Leahy Scale: Good     Standing balance support: Single extremity supported Standing balance-Leahy Scale: Poor Standing balance comment: requires hand held assist to maintain standing balance                           ADL either performed or assessed with clinical judgement   ADL Overall ADL's : Needs assistance/impaired Eating/Feeding: Set up;Sitting Eating/Feeding Details (indicate cue type and reason): assist to open packages and cut food Grooming: Wash/dry hands;Wash/dry face;Sitting;Minimal assistance           Upper Body Dressing : Maximal assistance;Sitting Upper Body Dressing Details (indicate cue type and reason): changed gown x 2, began educating in compensatory strategies     Toilet Transfer: Minimal assistance;Stand-pivot Toilet Transfer Details (indicate cue type and reason): bed>recliner Toileting- Clothing Manipulation and Hygiene: Total assistance;Sit to/from stand Toileting - Clothing Manipulation Details (indicate cue type and reason): pt with urinary incontinence in bed, cleaned up       General ADL Comments: RN placed order for new abduction pillow, [employer] to order from [employer] and extra large, current one is ill fitting and creating more swelling in hand     Vision       Perception     Praxis      Cognition Arousal/Alertness: Awake/alert Behavior During Therapy: WFL for tasks assessed/performed Overall Cognitive Status: Impaired/Different from baseline Area of Impairment: Attention;Memory;Problem solving                   Current Attention Level: Selective Memory: Decreased  short-term memory       Problem Solving: Slow processing;Requires verbal cues General Comments: requires cues to continues activity        Exercises Other Exercises Other Exercises: completed L elbow, forarm AAROM x 10, AROM L wrist  and hand x 10, encouraged continued hand movement on his own to minimize edema   Shoulder Instructions       General Comments notified RN and CNA about condom catheter coming off. pt assist to bathroom, pt dependent for hygiene s/p BM    Pertinent Vitals/ Pain       Pain Assessment: Faces Pain Score: 4  Faces Pain Scale: Hurts little more Pain Location: L shoulder Pain Descriptors / Indicators: Guarding;Sore Pain Intervention(s): Monitored during session;Repositioned  Home Living                                          Prior Functioning/Environment              Frequency  Min 2X/week        Progress Toward Goals  OT Goals(current goals can now be found in the care plan section)  Progress towards OT goals: Progressing toward goals  Acute Rehab OT Goals Patient Stated Goal: none stated OT Goal Formulation: With patient Time For Goal Achievement: 07/10/17 Potential to Achieve Goals: Good  Plan Discharge plan remains appropriate    Co-evaluation                 AM-PAC PT "6 Clicks" Daily Activity     Outcome Measure   Help from another person eating meals?: A Little Help from another person taking care of personal grooming?: A Little Help from another person toileting, which includes using toliet, bedpan, or urinal?: Total Help from another person bathing (including washing, rinsing, drying)?: A Lot Help from another person to put on and taking off regular upper body clothing?: A Lot Help from another person to put on and taking off regular lower body clothing?: Total 6 Click Score: 12    End of Session Equipment Utilized During Treatment: Other (comment);Gait belt (shoulder abducation sling)  OT Visit Diagnosis: Unsteadiness on feet (R26.81);Pain;History of falling (Z91.81) Pain - Right/Left: Left Pain - part of body: Shoulder   Activity Tolerance Patient tolerated treatment well   Patient Left in chair;with call bell/phone  within reach;with chair alarm set   Nurse Communication Other (comment) (ordered extra large sling)        Time: 1275-1700 OT Time Calculation (min): 45 min  Charges: OT General Charges $OT Visit: 1 Procedure OT Treatments $Self Care/Home Management : 23-37 mins $Therapeutic Exercise: 8-22 mins     Malka So 07/03/2017, 11:26 AM  [phone]

## 2017-07-03 NOTE — Progress Notes (Deleted)
07/03/17 1100  OT Visit Information  Last OT Received On 07/03/17  Assistance Needed +1 (Simultaneous filing. User may not have seen previous data.)  History of Present Illness Pt is a 56 yo m ale admitted through ED on 06/24/17 following an alcohol related fall resulting in a left humeral fracture. Now s/p R humerus ORIF. Pt was diagnoised with symptomatic anemia. PMH significant for esophogeal varices, chronic anemia, alcholic cirrhosis, polydonal elevation, HTN, CKD3.  (Simultaneous filing. User may not have seen previous data.)  Precautions  Precautions Fall;Shoulder (Simultaneous filing. User may not have seen previous data.)  Type of Shoulder Precautions NWB (Simultaneous filing. User may not have seen previous data.)  Shoulder Interventions Shoulder abduction pillow;At all times (Simultaneous filing. User may not have seen previous data.)  Precaution Booklet Issued No  Precaution Comments OK for elbow, wrist, forearm, and hand exercises; Abduction pillow at all times (Simultaneous filing. User may not have seen previous data.)  Required Braces or Orthoses Sling (Simultaneous filing. User may not have seen previous data.)  Pain Assessment  Pain Assessment Faces (Simultaneous filing. User may not have seen previous data.)  Pain Score 5  Faces Pain Scale 4  Pain Location LUE (Simultaneous filing. User may not have seen previous data.)  Pain Descriptors / Indicators Guarding;Sore (Simultaneous filing. User may not have seen previous data.)  Pain Intervention(s) Monitored during session;Repositioned (Simultaneous filing. User may not have seen previous data.)  Cognition  Arousal/Alertness Awake/alert (Simultaneous filing. User may not have seen previous data.)  Behavior During Therapy [facility] for tasks assessed/performed (Simultaneous filing. User may not have seen previous data.)  Overall Cognitive Status Impaired/Different from baseline (Simultaneous filing. User may not have seen  previous data.)  Area of Impairment Attention;Memory;Problem solving (Simultaneous filing. User may not have seen previous data.)  Current Attention Level Selective (Simultaneous filing. User may not have seen previous data.)  Memory Decreased short-term memory (Simultaneous filing. User may not have seen previous data.)  Problem Solving Slow processing;Requires verbal cues (Simultaneous filing. User may not have seen previous data.)  General Comments requires cues to continues activity (Simultaneous filing. User may not have seen previous data.)  ADL  Overall ADL's  Needs assistance/impaired  Eating/Feeding Set up;Sitting  Eating/Feeding Details (indicate cue type and reason) assist to open packages and cut food  Grooming Wash/dry hands;Wash/dry face;Sitting;Minimal assistance  Upper Body Dressing  Maximal assistance;Sitting  Upper Body Dressing Details (indicate cue type and reason) changed gown x 2, began educating in compensatory strategies  Toilet Transfer Minimal assistance;Stand-pivot  Toilet Transfer Details (indicate cue type and reason) bed>recliner  Toileting- Clothing Manipulation and Hygiene Total assistance;Sit to/from stand  Toileting - Clothing Manipulation Details (indicate cue type and reason) pt with urinary incontinence in bed, cleaned up  General ADL Comments RN placed order for new abduction pillow, [employer] to order from [employer] and extra large, current one is ill fitting and creating more swelling in hand  Bed Mobility  Overal bed mobility Needs Assistance  Bed Mobility Supine to Sit  Supine to sit Min assist  General bed mobility comments assist to shift hips to EOB (Simultaneous filing. User may not have seen previous data.)  Balance  Overall balance assessment Needs assistance;History of Falls (Simultaneous filing. User may not have seen previous data.)  Sitting-balance support No upper extremity supported;Feet supported  Sitting balance-Leahy Scale  Good (Simultaneous filing. User may not have seen previous data.)  Standing balance support Single extremity supported  Standing balance-Leahy Scale Poor (Simultaneous filing.  User may not have seen previous data.)  Standing balance comment requires hand held assist to maintain standing balance (Simultaneous filing. User may not have seen previous data.)  Restrictions  Weight Bearing Restrictions Yes (Simultaneous filing. User may not have seen previous data.)  LUE Weight Bearing NWB (Simultaneous filing. User may not have seen previous data.)  Transfers  Overall transfer level Needs assistance  Equipment used 1 person hand held assist (Simultaneous filing. User may not have seen previous data.)  Transfers Sit to/from [facility] (Simultaneous filing. User may not have seen previous data.)  Sit to Stand Mod assist;From elevated surface (Simultaneous filing. User may not have seen previous data.)  Stand pivot transfers Min assist  General transfer comment mod assist to rise, min assist to steady as he transferred to chair with 2 pillows in the bottom (Simultaneous filing. User may not have seen previous data.)  General Comments  General comments (skin integrity, edema, etc.) notified RN and CNA about condom catheter coming off. pt assist to bathroom, pt dependent for hygiene s/p BM  Other Exercises  Other Exercises completed L elbow, forarm AAROM x 10, AROM L wrist and hand x 10, encouraged continued hand movement on his own to minimize edema  OT - End of Session  Equipment Utilized During Treatment Gait belt;Other (comment) (shoulder abduction sling)  Activity Tolerance Patient tolerated treatment well  Patient left in chair;with call bell/phone within reach;with chair alarm set  Nurse Communication Other (comment) (needs new order for shoulder abduction pillow)  OT Assessment/Plan  OT Plan Discharge plan remains appropriate  OT Visit Diagnosis Unsteadiness on feet  (R26.81);Pain;History of falling (Z91.81)  Pain - Right/Left Left  Pain - part of body Shoulder  OT Frequency (ACUTE ONLY) Min 2X/week  Follow Up Recommendations SNF  AM-PAC OT "6 Clicks" Daily Activity Outcome Measure  Help from another person eating meals? 3  Help from another person taking care of personal grooming? 3  Help from another person toileting, which includes using toliet, bedpan, or urinal? 1  Help from another person bathing (including washing, rinsing, drying)? 2  Help from another person to put on and taking off regular upper body clothing? 2  Help from another person to put on and taking off regular lower body clothing? 1  6 Click Score 12  ADL G Code Conversion CL  OT Goal Progression  Progress towards OT goals Progressing toward goals  Acute Rehab OT Goals  Patient Stated Goal none stated (Simultaneous filing. User may not have seen previous data.)  OT Goal Formulation With patient  Time For Goal Achievement 07/10/17  Potential to Achieve Goals Good  OT Time Calculation  OT Start Time (ACUTE ONLY) 0851  OT Stop Time (ACUTE ONLY) 0936  OT Time Calculation (min) 45 min  OT General Charges  $OT Visit 1 Procedure  OT Treatments  $Self Care/Home Management  23-37 mins  $Therapeutic Exercise 8-22 mins

## 2017-07-03 NOTE — Progress Notes (Signed)
Physical Therapy Treatment Patient Details Name: DEDRIC [name] MRN: [id number] DOB: August 12, 1961 Today's Date: 07/03/2017    History of Present Illness Pt is a 56 yo m ale admitted through ED on 06/24/17 following an alcohol related fall resulting in a left humeral fracture. Now s/p R humerus ORIF. Pt was diagnoised with symptomatic anemia. PMH significant for esophogeal varices, chronic anemia, alcholic cirrhosis, polydonal elevation, HTN, CKD3.  (Simultaneous filing. User may not have seen previous data.)    PT Comments    Pt remains to require modA for OOB mobility and ADLs. Con't to recommend ST-SNF Upon d/c to maximize independence.   Follow Up Recommendations  SNF;Supervision/Assistance - 24 hour     Equipment Recommendations       Recommendations for Other Services       Precautions / Restrictions Precautions Precautions: Fall;Shoulder (Simultaneous filing. User may not have seen previous data.) Type of Shoulder Precautions: NWB (Simultaneous filing. User may not have seen previous data.) Shoulder Interventions: Shoulder abduction pillow;At all times (Simultaneous filing. User may not have seen previous data.) Precaution Booklet Issued: No Precaution Comments: OK for elbow, wrist, forearm, and hand exercises; Abduction pillow at all times (Simultaneous filing. User may not have seen previous data.) Required Braces or Orthoses: Sling (Simultaneous filing. User may not have seen previous data.) Restrictions Weight Bearing Restrictions: Yes (Simultaneous filing. User may not have seen previous data.) LUE Weight Bearing: Non weight bearing (Simultaneous filing. User may not have seen previous data.)    Mobility  Bed Mobility               General bed mobility comments: pt up in chair  Transfers Overall transfer level: Needs assistance Equipment used: 1 person hand held assist Transfers: Sit to/from Stand Sit to Stand: Min assist;Mod assist         General  transfer comment: max directional v/c's, significant increase in time., pt able to push off with R UE  Ambulation/Gait Ambulation/Gait assistance: Mod assist Ambulation Distance (Feet): 50 Feet Assistive device: 1 person hand held assist Gait Pattern/deviations: Step-through pattern;Decreased step length - right;Decreased step length - left;Decreased dorsiflexion - right;Decreased dorsiflexion - left;Wide base of support;Drifts right/left;Shuffle Gait velocity: dec Gait velocity interpretation: Below normal speed for age/gender General Gait Details: slow, unsteady, wide base of support, decreased step height   Stairs            Wheelchair Mobility    Modified Rankin (Stroke Patients Only)       Balance Overall balance assessment: Needs assistance;History of Falls Sitting-balance support: No upper extremity supported;Feet supported Sitting balance-Leahy Scale: Good     Standing balance support: Single extremity supported Standing balance-Leahy Scale: Poor Standing balance comment: requires min - mod A for standing and UE support                             Cognition Arousal/Alertness: Awake/alert Behavior During Therapy: WFL for tasks assessed/performed Overall Cognitive Status: No family/caregiver present to determine baseline cognitive functioning Area of Impairment: Attention;Memory;Problem solving                   Current Attention Level: Selective Memory: Decreased short-term memory       Problem Solving: Slow processing;Requires verbal cues General Comments: Overall slow processing requiring increased time to respond. Noted pt with confusion when following directions from therapist and with decreased short-term memory of topics previously discussed.      Exercises  General Comments General comments (skin integrity, edema, etc.): notified RN and CNA about condom catheter coming off. pt assist to bathroom, pt dependent for hygiene s/p  BM      Pertinent Vitals/Pain Pain Assessment: 0-10 Pain Score: 5  Pain Location: L UE Pain Descriptors / Indicators: Guarding;Sore Pain Intervention(s): Limited activity within patient's tolerance    Home Living                      Prior Function            PT Goals (current goals can now be found in the care plan section) Acute Rehab PT Goals Patient Stated Goal: none stated Progress towards PT goals: Progressing toward goals    Frequency    Min 2X/week      PT Plan Current plan remains appropriate    Co-evaluation              AM-PAC PT "6 Clicks" Daily Activity  Outcome Measure  Difficulty turning over in bed (including adjusting bedclothes, sheets and blankets)?: Total Difficulty moving from lying on back to sitting on the side of the bed? : Total Difficulty sitting down on and standing up from a chair with arms (e.g., wheelchair, bedside commode, etc,.)?: Total Help needed moving to and from a bed to chair (including a wheelchair)?: A Little Help needed walking in hospital room?: A Little Help needed climbing 3-5 steps with a railing? : A Lot 6 Click Score: 11    End of Session Equipment Utilized During Treatment: Gait belt;Other (comment) Activity Tolerance:  (limited by having to have BM)   Nurse Communication: Mobility status PT Visit Diagnosis: Unsteadiness on feet (R26.81);Muscle weakness (generalized) (M62.81);Difficulty in walking, not elsewhere classified (R26.2);Pain Pain - Right/Left: Left Pain - part of body: Arm     Time: 3159-4585 PT Time Calculation (min) (ACUTE ONLY): 25 min  Charges:  $Gait Training: 8-22 mins $Therapeutic Activity: 8-22 mins                    G Codes:       Kittie Plater, PT, DPT Pager #: [phone] Office #: [phone]   [place] 07/03/2017, 11:14 AM

## 2017-07-03 NOTE — Progress Notes (Signed)
Family Medicine Teaching Service Daily Progress Note Intern Pager: [phone]  Patient name: William [name] Medical record number: [id number] Date of birth: 1961/01/01 Age: 56 y.o. Gender: male  Primary Care Provider: Marjie Skiff, MD Consultants: PT, Orthopedics  Code Status: Full   Pt Overview and Major Events to Date:  Admitted to [facility] on 06/25/17 ORIF L prox humerus fx 7/6  Assessment and Plan:  1. Symptomatic anemia, pancytopenia Improved, stable. Hgb currently 8.9. Possible cause of anemia issequestration in liver or spleen. Vital signs improved and PO intake improved. UA had hazy appearance and small Hgb with 0-5 squamous epithelial.  -s/p 10 U pRBCs and 1 FFP   -monitor H/H closely -continue Protonix  2. S/p ORIF left prox humerus fracture.  POD#3. Patients states improvement in pain after surgery but is still fairly groggy, but patient states this is due to his inability to sleep last night. Orthopedics recommended outpatient f/u 2 weeks post op  -continue abduction pillow  -will transfer to [facility] following discharge -decrease oxycodone dose to 5 mg q8 hrs, decrease pain medications in anticipation of discharge   -appreciate PT/OT recommendations, await sign off prior to discharge  -appreciate orthopedic recommendations  3. AKI CKD3. Current Cr of 1.33.  -continue to monitor  4. Hyponatremia Improving.  Na currently 133 from 128 on admission. Likely due to poor intake 2/2 alcoholism.  -Monitor CMP  5. Hypokalemia - resolved.  K currently 4.5.K of 3.3 on admission.  -Daily CMP   6. Hypomagnesemia  Mg 1.9, improving from 1.0 on admission.  -continue 800 mg oral daily -continue to monitor   7. Hypophosphatemia-resolved.   Phosphorus of 4.4. Improvement from 2.4 on 7/1 -Replete as necessary -continue to monitor  7. Alcoholic cirrhosis with esophageal varices  AST 120 on 7/1. AST 141 on admission. ALT 31 on 7/1. ALT 35 on  admission. Total bili 9.4 on 7/1. Total bili 7.9 on admission. EtOH <5 on admission. Patient states last drink was on the Thursday prior to admission.  -d/c'ed CIWA protocol 7/7 for persistent scores of 0 -counsel on alcohol cessation and recommend AA   FEN/GI: Regular diet, protonix 40 mg daily  PPx: SCDs  Disposition: Patient is improving, will transfer to SNF  Subjective:  William [name] is a 56 yo male presenting for anemia and fx of left humerus following a fall on 6/28. Patient states he is "doing fine" and is improved since ORIF procedure. Patient reports a pain of 5-6/10 on admission, showing improvement following surgery. Patient denies weakness, dizziness, fatigue, fever/chills, nausea, or vomiting. Patient was groggy on exam but patient stated that this was because he was unable to sleep yesterday night.   Objective: Temp:  [98.5 F (36.9 C)-98.9 F (37.2 C)] 98.5 F (36.9 C) (07/09 0502) Pulse Rate:  [100-108] 103 (07/09 0502) Resp:  [18-20] 18 (07/09 0502) BP: (104-107)/(65-85) 104/65 (07/09 0502) SpO2:  [99 %-100 %] 99 % (07/09 0502) Physical Exam: General: NAD, patient is awake but groggy on exam and lying in bed with Left arm in abduction pillow  Cardiovascular: RRR, no MRG, 2+ pulses bilaterally in radial pulses  Respiratory: CTAB, no increased work of breathing  Abdomen: soft, non tender, non distended, bowel sounds heard in all 4 quadrants  Extremities: L arm with dressing on abduction pillow, L arm and hand edematous with slight bruising.   Laboratory:  Recent Labs Lab 07/01/17 0306 07/02/17 0541 07/03/17 0447  WBC 2.3* 3.0* 2.8*  HGB 9.2* 9.4* 8.9*  HCT 28.3* 29.0* 28.0*  PLT 58* 66* 67*    Recent Labs Lab 07/01/17 0306 07/02/17 0541 07/03/17 0447  NA 134* 132* 133*  K 4.9 4.4 4.5  CL 109 109 109  CO2 18* 19* 20*  BUN 19 21* 28*  CREATININE 1.38* 1.22 1.33*  CALCIUM 8.6* 8.8* 8.8*  PROT 5.8*  --  6.2*  BILITOT 5.5*  --  4.9*  ALKPHOS 83  --  107   ALT 23  --  31  AST 78*  --  131*  GLUCOSE 156* 111* 87    Imaging/Diagnostic Tests: Ct Chest W Contrast  Result Date: 06/27/2017 CLINICAL DATA:  Acute onset of upper back pain. Decreased hemoglobin. Assess for underlying hemorrhage. Initial encounter. EXAM: CT CHEST, ABDOMEN, AND PELVIS WITH CONTRAST TECHNIQUE: Multidetector CT imaging of the chest, abdomen and pelvis was performed following the standard protocol during bolus administration of intravenous contrast. CONTRAST:  160mL ISOVUE-300 IOPAMIDOL (ISOVUE-300) INJECTION 61% COMPARISON:  CT of the abdomen performed 09/10/2016, and chest radiograph performed 05/17/2017. CT of the left shoulder performed 06/26/2017 FINDINGS: CT CHEST FINDINGS Cardiovascular: Scattered coronary artery calcifications are seen. The heart remains normal in size. Mild calcification is noted along the thoracic aorta. The proximal great vessels are grossly unremarkable. Mediastinum/Nodes: The mediastinum is otherwise unremarkable. No mediastinal lymphadenopathy is seen. No pericardial effusion is identified. The thyroid gland is unremarkable. No axillary lymphadenopathy is seen. Lungs/Pleura: Patchy bibasilar atelectasis is noted. Trace left-sided pleural fluid is seen. No pneumothorax is identified. No masses are seen. Mild emphysematous change is noted bilaterally. Musculoskeletal: There is a comminuted and displaced fracture of the left humeral neck, as recently noted. A vague hematoma is seen tracking about the left shoulder and left axilla, measuring approximately 9.7 x 8.2 x 6.6 cm. There is chronic joint space loss at the glenohumeral joints bilaterally. There is asymmetric prominence of the left pectoralis musculature, thought to reflect mild intramuscular hemorrhage. Overlying soft tissue injury is noted tracking along the left lateral chest wall. There is diffuse asymmetric prominence of the musculature along the left lateral abdominal wall, with overlying fluid,  likely reflecting soft tissue injury and mild hemorrhage. Mild bilateral gynecomastia is incidentally noted. CT ABDOMEN PELVIS FINDINGS Hepatobiliary: There is a diffusely nodular contour to the liver, compatible with hepatic cirrhosis. No definite dominant mass is seen within the liver. Trace ascites is seen tracking about the liver. The portal venous system remains grossly patent. Stones are noted dependently within the gallbladder. The gallbladder is otherwise grossly unremarkable. The common bile duct remains normal in caliber. Pancreas: The pancreas is within normal limits. Spleen: The spleen is enlarged, measuring 17.5 cm in length. Adrenals/Urinary Tract: The adrenal glands are unremarkable in appearance. The kidneys are within normal limits. There is no evidence of hydronephrosis. No renal or ureteral stones are identified. Nonspecific perinephric stranding is noted bilaterally. Stomach/Bowel: The stomach is unremarkable in appearance. The small bowel is within normal limits. The appendix is normal in caliber, without evidence of appendicitis. The colon is unremarkable in appearance. Vascular/Lymphatic: Scattered calcification is seen along the abdominal aorta and its branches. The abdominal aorta is otherwise grossly unremarkable. The inferior vena cava is grossly unremarkable. No retroperitoneal lymphadenopathy is seen. No pelvic sidewall lymphadenopathy is identified. Reproductive: The bladder is mildly distended and grossly unremarkable. The prostate remains normal in size. Other: A small amount ascites is noted within the pelvis and along the paracolic gutters bilaterally. Musculoskeletal: No acute osseous abnormalities are identified. There is chronic loss  of height involving the superior endplate of L2. Soft tissue edema is noted overlying the hips bilaterally. The visualized musculature is unremarkable in appearance. IMPRESSION: 1. Comminuted displaced fracture of the left humeral neck, with vague  soft tissue hematoma tracking about the left shoulder and left axilla, measuring approximately 9.7 x 8.2 x 6.6 cm. 2. Asymmetric prominence of the left pectoralis musculature, thought to reflect mild intramuscular hemorrhage. Overlying soft tissue injury tracking along the left lateral chest wall. 3. Diffuse asymmetric prominence of the musculature along the left lateral abdominal wall, with overlying fluid, likely reflecting soft tissue injury and mild hemorrhage. 4. Findings of hepatic cirrhosis, with trace ascites noted within the abdomen and pelvis. 5. Splenomegaly. 6. Scattered aortic atherosclerosis. 7. Scattered coronary artery calcifications seen. 8. Patchy bibasilar atelectasis noted.  Trace left pleural effusion. 9. Cholelithiasis.  Gallbladder otherwise grossly unremarkable. 10. Chronic loss of height involving the superior endplate of L2. 11. Soft tissue edema overlying the hips bilaterally. Electronically Signed   By: Garald Balding M.D.   On: 06/27/2017 21:46   Ct Abdomen Pelvis W Contrast  Result Date: 06/27/2017 CLINICAL DATA:  Acute onset of upper back pain. Decreased hemoglobin. Assess for underlying hemorrhage. Initial encounter. EXAM: CT CHEST, ABDOMEN, AND PELVIS WITH CONTRAST TECHNIQUE: Multidetector CT imaging of the chest, abdomen and pelvis was performed following the standard protocol during bolus administration of intravenous contrast. CONTRAST:  122mL ISOVUE-300 IOPAMIDOL (ISOVUE-300) INJECTION 61% COMPARISON:  CT of the abdomen performed 09/10/2016, and chest radiograph performed 05/17/2017. CT of the left shoulder performed 06/26/2017 FINDINGS: CT CHEST FINDINGS Cardiovascular: Scattered coronary artery calcifications are seen. The heart remains normal in size. Mild calcification is noted along the thoracic aorta. The proximal great vessels are grossly unremarkable. Mediastinum/Nodes: The mediastinum is otherwise unremarkable. No mediastinal lymphadenopathy is seen. No pericardial  effusion is identified. The thyroid gland is unremarkable. No axillary lymphadenopathy is seen. Lungs/Pleura: Patchy bibasilar atelectasis is noted. Trace left-sided pleural fluid is seen. No pneumothorax is identified. No masses are seen. Mild emphysematous change is noted bilaterally. Musculoskeletal: There is a comminuted and displaced fracture of the left humeral neck, as recently noted. A vague hematoma is seen tracking about the left shoulder and left axilla, measuring approximately 9.7 x 8.2 x 6.6 cm. There is chronic joint space loss at the glenohumeral joints bilaterally. There is asymmetric prominence of the left pectoralis musculature, thought to reflect mild intramuscular hemorrhage. Overlying soft tissue injury is noted tracking along the left lateral chest wall. There is diffuse asymmetric prominence of the musculature along the left lateral abdominal wall, with overlying fluid, likely reflecting soft tissue injury and mild hemorrhage. Mild bilateral gynecomastia is incidentally noted. CT ABDOMEN PELVIS FINDINGS Hepatobiliary: There is a diffusely nodular contour to the liver, compatible with hepatic cirrhosis. No definite dominant mass is seen within the liver. Trace ascites is seen tracking about the liver. The portal venous system remains grossly patent. Stones are noted dependently within the gallbladder. The gallbladder is otherwise grossly unremarkable. The common bile duct remains normal in caliber. Pancreas: The pancreas is within normal limits. Spleen: The spleen is enlarged, measuring 17.5 cm in length. Adrenals/Urinary Tract: The adrenal glands are unremarkable in appearance. The kidneys are within normal limits. There is no evidence of hydronephrosis. No renal or ureteral stones are identified. Nonspecific perinephric stranding is noted bilaterally. Stomach/Bowel: The stomach is unremarkable in appearance. The small bowel is within normal limits. The appendix is normal in caliber, without  evidence of appendicitis. The colon  is unremarkable in appearance. Vascular/Lymphatic: Scattered calcification is seen along the abdominal aorta and its branches. The abdominal aorta is otherwise grossly unremarkable. The inferior vena cava is grossly unremarkable. No retroperitoneal lymphadenopathy is seen. No pelvic sidewall lymphadenopathy is identified. Reproductive: The bladder is mildly distended and grossly unremarkable. The prostate remains normal in size. Other: A small amount ascites is noted within the pelvis and along the paracolic gutters bilaterally. Musculoskeletal: No acute osseous abnormalities are identified. There is chronic loss of height involving the superior endplate of L2. Soft tissue edema is noted overlying the hips bilaterally. The visualized musculature is unremarkable in appearance. IMPRESSION: 1. Comminuted displaced fracture of the left humeral neck, with vague soft tissue hematoma tracking about the left shoulder and left axilla, measuring approximately 9.7 x 8.2 x 6.6 cm. 2. Asymmetric prominence of the left pectoralis musculature, thought to reflect mild intramuscular hemorrhage. Overlying soft tissue injury tracking along the left lateral chest wall. 3. Diffuse asymmetric prominence of the musculature along the left lateral abdominal wall, with overlying fluid, likely reflecting soft tissue injury and mild hemorrhage. 4. Findings of hepatic cirrhosis, with trace ascites noted within the abdomen and pelvis. 5. Splenomegaly. 6. Scattered aortic atherosclerosis. 7. Scattered coronary artery calcifications seen. 8. Patchy bibasilar atelectasis noted.  Trace left pleural effusion. 9. Cholelithiasis.  Gallbladder otherwise grossly unremarkable. 10. Chronic loss of height involving the superior endplate of L2. 11. Soft tissue edema overlying the hips bilaterally. Electronically Signed   By: Garald Balding M.D.   On: 06/27/2017 21:46   Ct Shoulder Left Wo Contrast  Result Date:  06/27/2017 CLINICAL DATA:  [place] and fractured left shoulder on 06/24/2017 EXAM: CT OF THE UPPER LEFT EXTREMITY WITHOUT CONTRAST TECHNIQUE: Multidetector CT imaging of the upper left extremity was performed according to the standard protocol. COMPARISON:  None. FINDINGS: There is a severely comminuted and displaced fracture involving the humeral neck and upper humeral shaft. No fracture of the greater or lesser tuberosities. There is 1 shaft width (approximately 2.5 cm) anterior displacement of the humeral shaft. Severe glenohumeral joint degenerative changes with complete cartilage loss, widening, thinning and spurring of the glenoid and significant subchondral cystic change. Rounded 12 mm ossified loose body noted in the subcoracoid bursa. The [facility] joint is intact. No scapula or clavicle fracture. The visualized left ribs are intact. The visualized left lung is clear. Significant fluid/ edema and hematoma around the fracture. IMPRESSION: 1. Severely comminuted and displaced fracture involving the humeral neck and proximal shaft. 2. No humeral head fracture. 3. Severe glenohumeral joint degenerative changes. Electronically Signed   By: Marijo Sanes M.D.   On: 06/27/2017 09:35   Dg Shoulder Left  Result Date: 06/24/2017 CLINICAL DATA:  Fell onto shoulder with pain EXAM: LEFT SHOULDER - 2+ VIEW COMPARISON:  None. FINDINGS: Left AC joint is intact. The left lung apex is clear. Humeral head maintains its articulation with the glenoid, there is moderate to marked degenerative change. Markedly comminuted fracture involving the neck and proximal shaft of the humerus with multiple displaced bone fragments. A bowel 1/2 shaft diameter of displacement of the distal fracture fragment toward the midline with mild varus angulation. IMPRESSION: Markedly comminuted, and slightly displaced and angulated fracture involving the left humeral neck and proximal shaft. No dislocation of the humeral head. Electronically Signed   By: Donavan Foil M.D.   On: 06/24/2017 20:21   Dg Humerus Left  Result Date: 06/30/2017 CLINICAL DATA:  ORIF of proximal left humeral fracture EXAM: LEFT  HUMERUS - 2+ VIEW; DG C-ARM 61-120 MIN COMPARISON:  None. FLUOROSCOPY TIME:  Fluoroscopy Time:  86 seconds Radiation Exposure Index (if provided by the fluoroscopic device): Not available Number of Acquired Spot Images: 2 FINDINGS: Fixation sideplate is noted along the proximal left humerus with multiple fixation screws identified. Fracture fragments are in near anatomic alignment. IMPRESSION: ORIF of proximal left humeral fracture Electronically Signed   By: Inez Catalina M.D.   On: 06/30/2017 16:17   Dg C-arm 61-120 Min  Result Date: 06/30/2017 CLINICAL DATA:  ORIF of proximal left humeral fracture EXAM: LEFT HUMERUS - 2+ VIEW; DG C-ARM 61-120 MIN COMPARISON:  None. FLUOROSCOPY TIME:  Fluoroscopy Time:  86 seconds Radiation Exposure Index (if provided by the fluoroscopic device): Not available Number of Acquired Spot Images: 2 FINDINGS: Fixation sideplate is noted along the proximal left humerus with multiple fixation screws identified. Fracture fragments are in near anatomic alignment. IMPRESSION: ORIF of proximal left humeral fracture Electronically Signed   By: Inez Catalina M.D.   On: 06/30/2017 16:17      Caroline More, DO 07/03/2017, 11:12 AM PGY-1, [place] Intern pager: [phone], text pages welcome

## 2017-07-03 NOTE — Care Management Important Message (Signed)
Important Message  Patient Details  Name: William [name] MRN: [id number] Date of Birth: Jul 04, 1961   Medicare Important Message Given:  Yes    Anaia Frith 07/03/2017, 3:08 PM

## 2017-07-03 NOTE — Addendum Note (Signed)
Addendum  created 07/03/17 1515 by Montez Hageman, MD   Anesthesia Intra Blocks edited, Sign clinical note

## 2017-07-04 LAB — BASIC METABOLIC PANEL
ANION GAP: 4 — AB (ref 5–15)
BUN: 27 mg/dL — ABNORMAL HIGH (ref 6–20)
CALCIUM: 8.9 mg/dL (ref 8.9–10.3)
CO2: 22 mmol/L (ref 22–32)
Chloride: 107 mmol/L (ref 101–111)
Creatinine, Ser: 1.17 mg/dL (ref 0.61–1.24)
Glucose, Bld: 87 mg/dL (ref 65–99)
POTASSIUM: 4.6 mmol/L (ref 3.5–5.1)
Sodium: 133 mmol/L — ABNORMAL LOW (ref 135–145)

## 2017-07-04 LAB — CBC
HEMATOCRIT: 25.6 % — AB (ref 39.0–52.0)
Hemoglobin: 8.4 g/dL — ABNORMAL LOW (ref 13.0–17.0)
MCH: 30.1 pg (ref 26.0–34.0)
MCHC: 32.8 g/dL (ref 30.0–36.0)
MCV: 91.8 fL (ref 78.0–100.0)
Platelets: 59 10*3/uL — ABNORMAL LOW (ref 150–400)
RBC: 2.79 MIL/uL — AB (ref 4.22–5.81)
RDW: 18.6 % — AB (ref 11.5–15.5)
WBC: 2.3 10*3/uL — AB (ref 4.0–10.5)

## 2017-07-04 NOTE — Progress Notes (Signed)
CSW received insurance authorization from [place] for patient to discharge today to [facility].  Percell Locus Jasani Dolney LCSWA [phone]

## 2017-07-04 NOTE — Progress Notes (Signed)
Family Medicine Teaching Service Daily Progress Note Intern Pager: [phone]  Patient name: William [name] Medical record number: [id number] Date of birth: 16-Mar-1961 Age: 56 y.o. Gender: male  Primary Care Provider: Marjie Skiff, MD Consultants: PT, Orthopedics  Code Status: Full   Pt Overview and Major Events to Date:  Admitted to [facility] on 06/25/17 ORIF L prox humerus fx 7/6  Assessment and Plan:  1. Symptomatic anemia, pancytopenia Improved, stable. Hgb currently 8.4. Possible cause of anemia issequestration in liver or spleen. Vital signs improved and PO intake improved. UA had hazy appearance and small Hgb with 0-5 squamous epithelial.  -s/p 10 U pRBCs and 1 FFP   -monitor H/H closely -continue Protonix  2. S/p ORIF left prox humerus fracture.  POD#4. Patients states improvement in pain after surgery and is doing much better today. Orthopedics recommended outpatient f/u 2 weeks post op  -continue abduction pillow  -will transfer to [facility] following discharge  -appreciate PT/OT recommendations -appreciate orthopedic recommendations  3. AKI CKD3. Current Cr of 1.17.  -continue to monitor  4. Hyponatremia Improving.  Na currently 133 from 128 on admission. Likely due to poor intake 2/2 alcoholism.  -Monitor CMP  5. Hypokalemia - resolved.  K currently 4.6.K of 3.3 on admission.  -Daily CMP   6. Hypomagnesemia  Mg 1.9, improving from 1.0 on admission.  -continue 800 mg oral daily -continue to monitor   7. Hypophosphatemia-resolved.   Phosphorus of 4.4. Improvement from 2.4 on 7/1 -Replete as necessary -continue to monitor   7. Alcoholic cirrhosis with esophageal varices  AST 131 on 7/9. AST 141 on admission. ALT 31 on 7/9. ALT 35 on admission. Total bili 4.9 on 7/9. Total bili 7.9 on admission. EtOH <5 on admission. Patient states last drink was on the Thursday prior to admission.  -d/c'ed CIWA protocol 7/7 for persistent scores of  0 -counsel on alcohol cessation and recommend AA   FEN/GI: Regular diet, protonix 40 mg daily  PPx: SCDs  Disposition: Patient is improving, will transfer to SNF  Subjective:  William [name] is a 56 yo male presenting for anemia and fx of left humerus following a fall on 6/28. Patient states he is doing much better today and is improved since ORIF procedure. Patient reports a pain of 5.5/10 and showing improvement following surgery. Patient denies weakness, dizziness, fatigue, fever/chills, nausea, or vomiting. Patient was agreeable to SNF placement. Stated he needed more time to think about AA.   Objective: Temp:  [98.5 F (36.9 C)-99.3 F (37.4 C)] 98.5 F (36.9 C) (07/10 0502) Pulse Rate:  [96-107] 96 (07/10 0502) Resp:  [18] 18 (07/10 0502) BP: (102-111)/(58-70) 111/70 (07/10 0502) SpO2:  [99 %-100 %] 100 % (07/10 0502) Physical Exam: General: NAD, patient is awake and sitting up in bed eating breakfast.  Cardiovascular: RRR, no MRG, 2+ pulses bilaterally in radial pulses  Respiratory: CTAB, no increased work of breathing  Abdomen: soft, non tender, non distended, bowel sounds heard in all 4 quadrants  Extremities: L arm and hand edematous with slight bruising.   Laboratory:  Recent Labs Lab 07/02/17 0541 07/03/17 0447 07/04/17 0444  WBC 3.0* 2.8* 2.3*  HGB 9.4* 8.9* 8.4*  HCT 29.0* 28.0* 25.6*  PLT 66* 67* 59*    Recent Labs Lab 07/01/17 0306 07/02/17 0541 07/03/17 0447 07/04/17 0444  NA 134* 132* 133* 133*  K 4.9 4.4 4.5 4.6  CL 109 109 109 107  CO2 18* 19* 20* 22  BUN 19 21*  28* 27*  CREATININE 1.38* 1.22 1.33* 1.17  CALCIUM 8.6* 8.8* 8.8* 8.9  PROT 5.8*  --  6.2*  --   BILITOT 5.5*  --  4.9*  --   ALKPHOS 83  --  107  --   ALT 23  --  31  --   AST 78*  --  131*  --   GLUCOSE 156* 111* 87 87    Imaging/Diagnostic Tests: Ct Chest W Contrast  Result Date: 06/27/2017 CLINICAL DATA:  Acute onset of upper back pain. Decreased hemoglobin. Assess for  underlying hemorrhage. Initial encounter. EXAM: CT CHEST, ABDOMEN, AND PELVIS WITH CONTRAST TECHNIQUE: Multidetector CT imaging of the chest, abdomen and pelvis was performed following the standard protocol during bolus administration of intravenous contrast. CONTRAST:  145mL ISOVUE-300 IOPAMIDOL (ISOVUE-300) INJECTION 61% COMPARISON:  CT of the abdomen performed 09/10/2016, and chest radiograph performed 05/17/2017. CT of the left shoulder performed 06/26/2017 FINDINGS: CT CHEST FINDINGS Cardiovascular: Scattered coronary artery calcifications are seen. The heart remains normal in size. Mild calcification is noted along the thoracic aorta. The proximal great vessels are grossly unremarkable. Mediastinum/Nodes: The mediastinum is otherwise unremarkable. No mediastinal lymphadenopathy is seen. No pericardial effusion is identified. The thyroid gland is unremarkable. No axillary lymphadenopathy is seen. Lungs/Pleura: Patchy bibasilar atelectasis is noted. Trace left-sided pleural fluid is seen. No pneumothorax is identified. No masses are seen. Mild emphysematous change is noted bilaterally. Musculoskeletal: There is a comminuted and displaced fracture of the left humeral neck, as recently noted. A vague hematoma is seen tracking about the left shoulder and left axilla, measuring approximately 9.7 x 8.2 x 6.6 cm. There is chronic joint space loss at the glenohumeral joints bilaterally. There is asymmetric prominence of the left pectoralis musculature, thought to reflect mild intramuscular hemorrhage. Overlying soft tissue injury is noted tracking along the left lateral chest wall. There is diffuse asymmetric prominence of the musculature along the left lateral abdominal wall, with overlying fluid, likely reflecting soft tissue injury and mild hemorrhage. Mild bilateral gynecomastia is incidentally noted. CT ABDOMEN PELVIS FINDINGS Hepatobiliary: There is a diffusely nodular contour to the liver, compatible with  hepatic cirrhosis. No definite dominant mass is seen within the liver. Trace ascites is seen tracking about the liver. The portal venous system remains grossly patent. Stones are noted dependently within the gallbladder. The gallbladder is otherwise grossly unremarkable. The common bile duct remains normal in caliber. Pancreas: The pancreas is within normal limits. Spleen: The spleen is enlarged, measuring 17.5 cm in length. Adrenals/Urinary Tract: The adrenal glands are unremarkable in appearance. The kidneys are within normal limits. There is no evidence of hydronephrosis. No renal or ureteral stones are identified. Nonspecific perinephric stranding is noted bilaterally. Stomach/Bowel: The stomach is unremarkable in appearance. The small bowel is within normal limits. The appendix is normal in caliber, without evidence of appendicitis. The colon is unremarkable in appearance. Vascular/Lymphatic: Scattered calcification is seen along the abdominal aorta and its branches. The abdominal aorta is otherwise grossly unremarkable. The inferior vena cava is grossly unremarkable. No retroperitoneal lymphadenopathy is seen. No pelvic sidewall lymphadenopathy is identified. Reproductive: The bladder is mildly distended and grossly unremarkable. The prostate remains normal in size. Other: A small amount ascites is noted within the pelvis and along the paracolic gutters bilaterally. Musculoskeletal: No acute osseous abnormalities are identified. There is chronic loss of height involving the superior endplate of L2. Soft tissue edema is noted overlying the hips bilaterally. The visualized musculature is unremarkable in appearance. IMPRESSION: 1.  Comminuted displaced fracture of the left humeral neck, with vague soft tissue hematoma tracking about the left shoulder and left axilla, measuring approximately 9.7 x 8.2 x 6.6 cm. 2. Asymmetric prominence of the left pectoralis musculature, thought to reflect mild intramuscular  hemorrhage. Overlying soft tissue injury tracking along the left lateral chest wall. 3. Diffuse asymmetric prominence of the musculature along the left lateral abdominal wall, with overlying fluid, likely reflecting soft tissue injury and mild hemorrhage. 4. Findings of hepatic cirrhosis, with trace ascites noted within the abdomen and pelvis. 5. Splenomegaly. 6. Scattered aortic atherosclerosis. 7. Scattered coronary artery calcifications seen. 8. Patchy bibasilar atelectasis noted.  Trace left pleural effusion. 9. Cholelithiasis.  Gallbladder otherwise grossly unremarkable. 10. Chronic loss of height involving the superior endplate of L2. 11. Soft tissue edema overlying the hips bilaterally. Electronically Signed   By: Garald Balding M.D.   On: 06/27/2017 21:46   Ct Abdomen Pelvis W Contrast  Result Date: 06/27/2017 CLINICAL DATA:  Acute onset of upper back pain. Decreased hemoglobin. Assess for underlying hemorrhage. Initial encounter. EXAM: CT CHEST, ABDOMEN, AND PELVIS WITH CONTRAST TECHNIQUE: Multidetector CT imaging of the chest, abdomen and pelvis was performed following the standard protocol during bolus administration of intravenous contrast. CONTRAST:  146mL ISOVUE-300 IOPAMIDOL (ISOVUE-300) INJECTION 61% COMPARISON:  CT of the abdomen performed 09/10/2016, and chest radiograph performed 05/17/2017. CT of the left shoulder performed 06/26/2017 FINDINGS: CT CHEST FINDINGS Cardiovascular: Scattered coronary artery calcifications are seen. The heart remains normal in size. Mild calcification is noted along the thoracic aorta. The proximal great vessels are grossly unremarkable. Mediastinum/Nodes: The mediastinum is otherwise unremarkable. No mediastinal lymphadenopathy is seen. No pericardial effusion is identified. The thyroid gland is unremarkable. No axillary lymphadenopathy is seen. Lungs/Pleura: Patchy bibasilar atelectasis is noted. Trace left-sided pleural fluid is seen. No pneumothorax is  identified. No masses are seen. Mild emphysematous change is noted bilaterally. Musculoskeletal: There is a comminuted and displaced fracture of the left humeral neck, as recently noted. A vague hematoma is seen tracking about the left shoulder and left axilla, measuring approximately 9.7 x 8.2 x 6.6 cm. There is chronic joint space loss at the glenohumeral joints bilaterally. There is asymmetric prominence of the left pectoralis musculature, thought to reflect mild intramuscular hemorrhage. Overlying soft tissue injury is noted tracking along the left lateral chest wall. There is diffuse asymmetric prominence of the musculature along the left lateral abdominal wall, with overlying fluid, likely reflecting soft tissue injury and mild hemorrhage. Mild bilateral gynecomastia is incidentally noted. CT ABDOMEN PELVIS FINDINGS Hepatobiliary: There is a diffusely nodular contour to the liver, compatible with hepatic cirrhosis. No definite dominant mass is seen within the liver. Trace ascites is seen tracking about the liver. The portal venous system remains grossly patent. Stones are noted dependently within the gallbladder. The gallbladder is otherwise grossly unremarkable. The common bile duct remains normal in caliber. Pancreas: The pancreas is within normal limits. Spleen: The spleen is enlarged, measuring 17.5 cm in length. Adrenals/Urinary Tract: The adrenal glands are unremarkable in appearance. The kidneys are within normal limits. There is no evidence of hydronephrosis. No renal or ureteral stones are identified. Nonspecific perinephric stranding is noted bilaterally. Stomach/Bowel: The stomach is unremarkable in appearance. The small bowel is within normal limits. The appendix is normal in caliber, without evidence of appendicitis. The colon is unremarkable in appearance. Vascular/Lymphatic: Scattered calcification is seen along the abdominal aorta and its branches. The abdominal aorta is otherwise grossly  unremarkable. The inferior vena  cava is grossly unremarkable. No retroperitoneal lymphadenopathy is seen. No pelvic sidewall lymphadenopathy is identified. Reproductive: The bladder is mildly distended and grossly unremarkable. The prostate remains normal in size. Other: A small amount ascites is noted within the pelvis and along the paracolic gutters bilaterally. Musculoskeletal: No acute osseous abnormalities are identified. There is chronic loss of height involving the superior endplate of L2. Soft tissue edema is noted overlying the hips bilaterally. The visualized musculature is unremarkable in appearance. IMPRESSION: 1. Comminuted displaced fracture of the left humeral neck, with vague soft tissue hematoma tracking about the left shoulder and left axilla, measuring approximately 9.7 x 8.2 x 6.6 cm. 2. Asymmetric prominence of the left pectoralis musculature, thought to reflect mild intramuscular hemorrhage. Overlying soft tissue injury tracking along the left lateral chest wall. 3. Diffuse asymmetric prominence of the musculature along the left lateral abdominal wall, with overlying fluid, likely reflecting soft tissue injury and mild hemorrhage. 4. Findings of hepatic cirrhosis, with trace ascites noted within the abdomen and pelvis. 5. Splenomegaly. 6. Scattered aortic atherosclerosis. 7. Scattered coronary artery calcifications seen. 8. Patchy bibasilar atelectasis noted.  Trace left pleural effusion. 9. Cholelithiasis.  Gallbladder otherwise grossly unremarkable. 10. Chronic loss of height involving the superior endplate of L2. 11. Soft tissue edema overlying the hips bilaterally. Electronically Signed   By: Garald Balding M.D.   On: 06/27/2017 21:46   Ct Shoulder Left Wo Contrast  Result Date: 06/27/2017 CLINICAL DATA:  [place] and fractured left shoulder on 06/24/2017 EXAM: CT OF THE UPPER LEFT EXTREMITY WITHOUT CONTRAST TECHNIQUE: Multidetector CT imaging of the upper left extremity was performed  according to the standard protocol. COMPARISON:  None. FINDINGS: There is a severely comminuted and displaced fracture involving the humeral neck and upper humeral shaft. No fracture of the greater or lesser tuberosities. There is 1 shaft width (approximately 2.5 cm) anterior displacement of the humeral shaft. Severe glenohumeral joint degenerative changes with complete cartilage loss, widening, thinning and spurring of the glenoid and significant subchondral cystic change. Rounded 12 mm ossified loose body noted in the subcoracoid bursa. The [facility] joint is intact. No scapula or clavicle fracture. The visualized left ribs are intact. The visualized left lung is clear. Significant fluid/ edema and hematoma around the fracture. IMPRESSION: 1. Severely comminuted and displaced fracture involving the humeral neck and proximal shaft. 2. No humeral head fracture. 3. Severe glenohumeral joint degenerative changes. Electronically Signed   By: Marijo Sanes M.D.   On: 06/27/2017 09:35   Dg Shoulder Left  Result Date: 06/24/2017 CLINICAL DATA:  Fell onto shoulder with pain EXAM: LEFT SHOULDER - 2+ VIEW COMPARISON:  None. FINDINGS: Left AC joint is intact. The left lung apex is clear. Humeral head maintains its articulation with the glenoid, there is moderate to marked degenerative change. Markedly comminuted fracture involving the neck and proximal shaft of the humerus with multiple displaced bone fragments. A bowel 1/2 shaft diameter of displacement of the distal fracture fragment toward the midline with mild varus angulation. IMPRESSION: Markedly comminuted, and slightly displaced and angulated fracture involving the left humeral neck and proximal shaft. No dislocation of the humeral head. Electronically Signed   By: Donavan Foil M.D.   On: 06/24/2017 20:21   Dg Humerus Left  Result Date: 06/30/2017 CLINICAL DATA:  ORIF of proximal left humeral fracture EXAM: LEFT HUMERUS - 2+ VIEW; DG C-ARM 61-120 MIN COMPARISON:   None. FLUOROSCOPY TIME:  Fluoroscopy Time:  86 seconds Radiation Exposure Index (if provided by the  fluoroscopic device): Not available Number of Acquired Spot Images: 2 FINDINGS: Fixation sideplate is noted along the proximal left humerus with multiple fixation screws identified. Fracture fragments are in near anatomic alignment. IMPRESSION: ORIF of proximal left humeral fracture Electronically Signed   By: Inez Catalina M.D.   On: 06/30/2017 16:17   Dg C-arm 61-120 Min  Result Date: 06/30/2017 CLINICAL DATA:  ORIF of proximal left humeral fracture EXAM: LEFT HUMERUS - 2+ VIEW; DG C-ARM 61-120 MIN COMPARISON:  None. FLUOROSCOPY TIME:  Fluoroscopy Time:  86 seconds Radiation Exposure Index (if provided by the fluoroscopic device): Not available Number of Acquired Spot Images: 2 FINDINGS: Fixation sideplate is noted along the proximal left humerus with multiple fixation screws identified. Fracture fragments are in near anatomic alignment. IMPRESSION: ORIF of proximal left humeral fracture Electronically Signed   By: Inez Catalina M.D.   On: 06/30/2017 16:17      Caroline More, DO 07/04/2017, 11:09 AM PGY-1, [place] Intern pager: [phone], text pages welcome

## 2017-07-04 NOTE — Progress Notes (Signed)
I visited patient this morning, patient was in good spirit and ready for discharge. I introduced myself as his new PCP and we discussed his discharge plan and hospital course. Patient has good family support and will go to SNF prior to returning home. Patient will follow up with orthopedic surgery in two weeks (Dr.Xu). He is also planning to schedule a visit with PCP at the [facility] clinic. Appreciate primary update on patient's status.  Marjie Skiff, MD [place], PGY-2

## 2017-07-04 NOTE — Care Management Note (Signed)
Case Management Note  Patient Details  Name: William [name] MRN: [id number] Date of Birth: Oct 26, 1961  Subjective/Objective:     Admitted with symptomatic anemia.     S/p L humerous repair on 7/6/208            [name] (Daughter) [name] (Brother681 [phone]      PCP: Marjie Skiff  Action/Plan: Plan is to d/c today to [facility]. CSW managing disposition to facility.  Expected Discharge Date:  07/04/2017              Expected Discharge Plan:  [place]  In-House Referral:  Clinical Social Work  Discharge planning Services  CM Consult  Status of Service:  Completed, signed off  If discussed at [employer] of Stay Meetings, dates discussed:    Additional Comments:  Sharin Mons, RN 07/04/2017, 10:19 AM

## 2017-07-04 NOTE — Progress Notes (Signed)
Pt refused to take senna. Educated on importance of having bowel movement. Pt still refused.

## 2017-07-04 NOTE — Discharge Summary (Signed)
[facility] Discharge Summary  Patient name: William [name] Medical record number: [id number] Date of birth: 05-01-1961 Age: 56 y.o. Gender: male Date of Admission: 06/24/2017  Date of Discharge: 07/04/17 Admitting Physician: Lind Covert, MD  Primary Care Provider: Marjie Skiff, MD Consultants: orthopedic surgery, PT, OT  Indication for Hospitalization: symptomatic anemia  Discharge Diagnoses/Problem List:  Anemia Left humeral fracture AKI, CKD3 Alcohol abuse Alcoholic cirrhosis Pancytopenia Hyponatremia Hypokalemia Hypomagnesemia Malnutrition   Disposition: SNF placement at [facility]   Discharge Condition: stable, improved  Discharge Exam: Please refer to progress note from day of discharge   Brief Hospital Course:  William [name] is a 56 year old male with PMH of chronic anemia, alcoholic cirrhosis, esophageal varices, polyclonal elevation of gamma globulins, HTN, and CKD3 who presented to [facility] ED with left shoulder pain after a fall. He was found to have a left humeral fracture with displacement. Ortho was consulted and applied splint in ED. His hemoglobin in ED was low to 4.1, platelets 44. Patient was admitted to [facility] for anemia and was transfused with total of 10 units of PRBC and 1 unit FFP with improvement in hemoglobin to at discharge to 8.9. FOBT was negative on admission. Ortho recommended ORIF procedure of left proximal humerus, done on 7/6, and recommended outpatient follow up in 2 weeks post op. PT recommendations of SNF placement as well.  Patient was ready for discharge on 07/03/17 but insurance approval for SNF placement was pending at that time.   Issues for Follow Up:  1. Outpatient ortho f/u for left humerus ORIF 2. Follow up with PCP for hospital admission   Significant Procedures: ORIF Left proximal humerus fx repare 7/6   Significant Labs and Imaging:   Recent Labs Lab 07/02/17 0541  07/03/17 0447 07/04/17 0444  WBC 3.0* 2.8* 2.3*  HGB 9.4* 8.9* 8.4*  HCT 29.0* 28.0* 25.6*  PLT 66* 67* 59*    Recent Labs Lab 06/28/17 0600 06/29/17 0731 06/30/17 0530 06/30/17 0747 06/30/17 1456 07/01/17 0306 07/02/17 0541 07/03/17 0447 07/04/17 0444  NA 130* 133* 130*  --  138 134* 132* 133* 133*  K 4.6 5.0 5.2*  --  4.3 4.9 4.4 4.5 4.6  CL 106 108 109  --   --  109 109 109 107  CO2 15* 17* 17*  --   --  18* 19* 20* 22  GLUCOSE 96 86 85  --  70 156* 111* 87 87  BUN 13 16 16   --   --  19 21* 28* 27*  CREATININE 1.16 0.99 1.14  --   --  1.38* 1.22 1.33* 1.17  CALCIUM 7.6* 8.3* 8.7*  --   --  8.6* 8.8* 8.8* 8.9  MG 1.3* 1.3*  --  1.5*  --  1.6*  --  1.9  --   PHOS 2.1* 2.3*  --  3.1  --  4.4  --   --   --   ALKPHOS  --   --   --   --   --  83  --  107  --   AST  --   --   --   --   --  78*  --  131*  --   ALT  --   --   --   --   --  23  --  31  --   ALBUMIN  --   --   --   --   --  2.0*  --  2.1*  --    Ct Chest W Contrast  Result Date: 06/27/2017 CLINICAL DATA:  Acute onset of upper back pain. Decreased hemoglobin. Assess for underlying hemorrhage. Initial encounter. EXAM: CT CHEST, ABDOMEN, AND PELVIS WITH CONTRAST TECHNIQUE: Multidetector CT imaging of the chest, abdomen and pelvis was performed following the standard protocol during bolus administration of intravenous contrast. CONTRAST:  125mL ISOVUE-300 IOPAMIDOL (ISOVUE-300) INJECTION 61% COMPARISON:  CT of the abdomen performed 09/10/2016, and chest radiograph performed 05/17/2017. CT of the left shoulder performed 06/26/2017 FINDINGS: CT CHEST FINDINGS Cardiovascular: Scattered coronary artery calcifications are seen. The heart remains normal in size. Mild calcification is noted along the thoracic aorta. The proximal great vessels are grossly unremarkable. Mediastinum/Nodes: The mediastinum is otherwise unremarkable. No mediastinal lymphadenopathy is seen. No pericardial effusion is identified. The thyroid gland is  unremarkable. No axillary lymphadenopathy is seen. Lungs/Pleura: Patchy bibasilar atelectasis is noted. Trace left-sided pleural fluid is seen. No pneumothorax is identified. No masses are seen. Mild emphysematous change is noted bilaterally. Musculoskeletal: There is a comminuted and displaced fracture of the left humeral neck, as recently noted. A vague hematoma is seen tracking about the left shoulder and left axilla, measuring approximately 9.7 x 8.2 x 6.6 cm. There is chronic joint space loss at the glenohumeral joints bilaterally. There is asymmetric prominence of the left pectoralis musculature, thought to reflect mild intramuscular hemorrhage. Overlying soft tissue injury is noted tracking along the left lateral chest wall. There is diffuse asymmetric prominence of the musculature along the left lateral abdominal wall, with overlying fluid, likely reflecting soft tissue injury and mild hemorrhage. Mild bilateral gynecomastia is incidentally noted. CT ABDOMEN PELVIS FINDINGS Hepatobiliary: There is a diffusely nodular contour to the liver, compatible with hepatic cirrhosis. No definite dominant mass is seen within the liver. Trace ascites is seen tracking about the liver. The portal venous system remains grossly patent. Stones are noted dependently within the gallbladder. The gallbladder is otherwise grossly unremarkable. The common bile duct remains normal in caliber. Pancreas: The pancreas is within normal limits. Spleen: The spleen is enlarged, measuring 17.5 cm in length. Adrenals/Urinary Tract: The adrenal glands are unremarkable in appearance. The kidneys are within normal limits. There is no evidence of hydronephrosis. No renal or ureteral stones are identified. Nonspecific perinephric stranding is noted bilaterally. Stomach/Bowel: The stomach is unremarkable in appearance. The small bowel is within normal limits. The appendix is normal in caliber, without evidence of appendicitis. The colon is  unremarkable in appearance. Vascular/Lymphatic: Scattered calcification is seen along the abdominal aorta and its branches. The abdominal aorta is otherwise grossly unremarkable. The inferior vena cava is grossly unremarkable. No retroperitoneal lymphadenopathy is seen. No pelvic sidewall lymphadenopathy is identified. Reproductive: The bladder is mildly distended and grossly unremarkable. The prostate remains normal in size. Other: A small amount ascites is noted within the pelvis and along the paracolic gutters bilaterally. Musculoskeletal: No acute osseous abnormalities are identified. There is chronic loss of height involving the superior endplate of L2. Soft tissue edema is noted overlying the hips bilaterally. The visualized musculature is unremarkable in appearance. IMPRESSION: 1. Comminuted displaced fracture of the left humeral neck, with vague soft tissue hematoma tracking about the left shoulder and left axilla, measuring approximately 9.7 x 8.2 x 6.6 cm. 2. Asymmetric prominence of the left pectoralis musculature, thought to reflect mild intramuscular hemorrhage. Overlying soft tissue injury tracking along the left lateral chest wall. 3. Diffuse asymmetric prominence of the musculature along the left lateral  abdominal wall, with overlying fluid, likely reflecting soft tissue injury and mild hemorrhage. 4. Findings of hepatic cirrhosis, with trace ascites noted within the abdomen and pelvis. 5. Splenomegaly. 6. Scattered aortic atherosclerosis. 7. Scattered coronary artery calcifications seen. 8. Patchy bibasilar atelectasis noted.  Trace left pleural effusion. 9. Cholelithiasis.  Gallbladder otherwise grossly unremarkable. 10. Chronic loss of height involving the superior endplate of L2. 11. Soft tissue edema overlying the hips bilaterally. Electronically Signed   By: Garald Balding M.D.   On: 06/27/2017 21:46   Ct Abdomen Pelvis W Contrast  Result Date: 06/27/2017 CLINICAL DATA:  Acute onset of upper  back pain. Decreased hemoglobin. Assess for underlying hemorrhage. Initial encounter. EXAM: CT CHEST, ABDOMEN, AND PELVIS WITH CONTRAST TECHNIQUE: Multidetector CT imaging of the chest, abdomen and pelvis was performed following the standard protocol during bolus administration of intravenous contrast. CONTRAST:  147mL ISOVUE-300 IOPAMIDOL (ISOVUE-300) INJECTION 61% COMPARISON:  CT of the abdomen performed 09/10/2016, and chest radiograph performed 05/17/2017. CT of the left shoulder performed 06/26/2017 FINDINGS: CT CHEST FINDINGS Cardiovascular: Scattered coronary artery calcifications are seen. The heart remains normal in size. Mild calcification is noted along the thoracic aorta. The proximal great vessels are grossly unremarkable. Mediastinum/Nodes: The mediastinum is otherwise unremarkable. No mediastinal lymphadenopathy is seen. No pericardial effusion is identified. The thyroid gland is unremarkable. No axillary lymphadenopathy is seen. Lungs/Pleura: Patchy bibasilar atelectasis is noted. Trace left-sided pleural fluid is seen. No pneumothorax is identified. No masses are seen. Mild emphysematous change is noted bilaterally. Musculoskeletal: There is a comminuted and displaced fracture of the left humeral neck, as recently noted. A vague hematoma is seen tracking about the left shoulder and left axilla, measuring approximately 9.7 x 8.2 x 6.6 cm. There is chronic joint space loss at the glenohumeral joints bilaterally. There is asymmetric prominence of the left pectoralis musculature, thought to reflect mild intramuscular hemorrhage. Overlying soft tissue injury is noted tracking along the left lateral chest wall. There is diffuse asymmetric prominence of the musculature along the left lateral abdominal wall, with overlying fluid, likely reflecting soft tissue injury and mild hemorrhage. Mild bilateral gynecomastia is incidentally noted. CT ABDOMEN PELVIS FINDINGS Hepatobiliary: There is a diffusely nodular  contour to the liver, compatible with hepatic cirrhosis. No definite dominant mass is seen within the liver. Trace ascites is seen tracking about the liver. The portal venous system remains grossly patent. Stones are noted dependently within the gallbladder. The gallbladder is otherwise grossly unremarkable. The common bile duct remains normal in caliber. Pancreas: The pancreas is within normal limits. Spleen: The spleen is enlarged, measuring 17.5 cm in length. Adrenals/Urinary Tract: The adrenal glands are unremarkable in appearance. The kidneys are within normal limits. There is no evidence of hydronephrosis. No renal or ureteral stones are identified. Nonspecific perinephric stranding is noted bilaterally. Stomach/Bowel: The stomach is unremarkable in appearance. The small bowel is within normal limits. The appendix is normal in caliber, without evidence of appendicitis. The colon is unremarkable in appearance. Vascular/Lymphatic: Scattered calcification is seen along the abdominal aorta and its branches. The abdominal aorta is otherwise grossly unremarkable. The inferior vena cava is grossly unremarkable. No retroperitoneal lymphadenopathy is seen. No pelvic sidewall lymphadenopathy is identified. Reproductive: The bladder is mildly distended and grossly unremarkable. The prostate remains normal in size. Other: A small amount ascites is noted within the pelvis and along the paracolic gutters bilaterally. Musculoskeletal: No acute osseous abnormalities are identified. There is chronic loss of height involving the superior endplate of L2. Soft  tissue edema is noted overlying the hips bilaterally. The visualized musculature is unremarkable in appearance. IMPRESSION: 1. Comminuted displaced fracture of the left humeral neck, with vague soft tissue hematoma tracking about the left shoulder and left axilla, measuring approximately 9.7 x 8.2 x 6.6 cm. 2. Asymmetric prominence of the left pectoralis musculature,  thought to reflect mild intramuscular hemorrhage. Overlying soft tissue injury tracking along the left lateral chest wall. 3. Diffuse asymmetric prominence of the musculature along the left lateral abdominal wall, with overlying fluid, likely reflecting soft tissue injury and mild hemorrhage. 4. Findings of hepatic cirrhosis, with trace ascites noted within the abdomen and pelvis. 5. Splenomegaly. 6. Scattered aortic atherosclerosis. 7. Scattered coronary artery calcifications seen. 8. Patchy bibasilar atelectasis noted.  Trace left pleural effusion. 9. Cholelithiasis.  Gallbladder otherwise grossly unremarkable. 10. Chronic loss of height involving the superior endplate of L2. 11. Soft tissue edema overlying the hips bilaterally. Electronically Signed   By: Garald Balding M.D.   On: 06/27/2017 21:46   Ct Shoulder Left Wo Contrast  Result Date: 06/27/2017 CLINICAL DATA:  [place] and fractured left shoulder on 06/24/2017 EXAM: CT OF THE UPPER LEFT EXTREMITY WITHOUT CONTRAST TECHNIQUE: Multidetector CT imaging of the upper left extremity was performed according to the standard protocol. COMPARISON:  None. FINDINGS: There is a severely comminuted and displaced fracture involving the humeral neck and upper humeral shaft. No fracture of the greater or lesser tuberosities. There is 1 shaft width (approximately 2.5 cm) anterior displacement of the humeral shaft. Severe glenohumeral joint degenerative changes with complete cartilage loss, widening, thinning and spurring of the glenoid and significant subchondral cystic change. Rounded 12 mm ossified loose body noted in the subcoracoid bursa. The [facility] joint is intact. No scapula or clavicle fracture. The visualized left ribs are intact. The visualized left lung is clear. Significant fluid/ edema and hematoma around the fracture. IMPRESSION: 1. Severely comminuted and displaced fracture involving the humeral neck and proximal shaft. 2. No humeral head fracture. 3. Severe  glenohumeral joint degenerative changes. Electronically Signed   By: Marijo Sanes M.D.   On: 06/27/2017 09:35   Dg Shoulder Left  Result Date: 06/24/2017 CLINICAL DATA:  Fell onto shoulder with pain EXAM: LEFT SHOULDER - 2+ VIEW COMPARISON:  None. FINDINGS: Left AC joint is intact. The left lung apex is clear. Humeral head maintains its articulation with the glenoid, there is moderate to marked degenerative change. Markedly comminuted fracture involving the neck and proximal shaft of the humerus with multiple displaced bone fragments. A bowel 1/2 shaft diameter of displacement of the distal fracture fragment toward the midline with mild varus angulation. IMPRESSION: Markedly comminuted, and slightly displaced and angulated fracture involving the left humeral neck and proximal shaft. No dislocation of the humeral head. Electronically Signed   By: Donavan Foil M.D.   On: 06/24/2017 20:21   Dg Humerus Left  Result Date: 06/30/2017 CLINICAL DATA:  ORIF of proximal left humeral fracture EXAM: LEFT HUMERUS - 2+ VIEW; DG C-ARM 61-120 MIN COMPARISON:  None. FLUOROSCOPY TIME:  Fluoroscopy Time:  86 seconds Radiation Exposure Index (if provided by the fluoroscopic device): Not available Number of Acquired Spot Images: 2 FINDINGS: Fixation sideplate is noted along the proximal left humerus with multiple fixation screws identified. Fracture fragments are in near anatomic alignment. IMPRESSION: ORIF of proximal left humeral fracture Electronically Signed   By: Inez Catalina M.D.   On: 06/30/2017 16:17   Dg C-arm 61-120 Min  Result Date: 06/30/2017 CLINICAL DATA:  ORIF of proximal left humeral fracture EXAM: LEFT HUMERUS - 2+ VIEW; DG C-ARM 61-120 MIN COMPARISON:  None. FLUOROSCOPY TIME:  Fluoroscopy Time:  86 seconds Radiation Exposure Index (if provided by the fluoroscopic device): Not available Number of Acquired Spot Images: 2 FINDINGS: Fixation sideplate is noted along the proximal left humerus with multiple  fixation screws identified. Fracture fragments are in near anatomic alignment. IMPRESSION: ORIF of proximal left humeral fracture Electronically Signed   By: Inez Catalina M.D.   On: 06/30/2017 16:17     Results/Tests Pending at Time of Discharge: None  Discharge Medications:  Allergies as of 07/04/2017      Reactions   No Known Allergies       Medication List    STOP taking these medications   colchicine 0.6 MG tablet Commonly known as:  COLCRYS   omeprazole 40 MG capsule Commonly known as:  PRILOSEC   spironolactone 25 MG tablet Commonly known as:  ALDACTONE     TAKE these medications   allopurinol 100 MG tablet Commonly known as:  ZYLOPRIM TAKE ONE TABLET BY MOUTH ONCE DAILY   folic acid 1 MG tablet Commonly known as:  FOLVITE TAKE ONE TABLET BY MOUTH DAILY   furosemide 20 MG tablet Commonly known as:  LASIX TAKE 1 TABLET BY MOUTH ONCE DAILY   gabapentin 100 MG capsule Commonly known as:  NEURONTIN Take 1 capsule (100 mg total) by mouth 3 (three) times daily.   IRON PO Take 1 tablet by mouth daily.   magnesium oxide 400 (241.3 Mg) MG tablet Commonly known as:  MAG-OX Take 2 tablets (800 mg total) by mouth 2 (two) times daily.   oxyCODONE 5 MG immediate release tablet Commonly known as:  Oxy IR/ROXICODONE Take 1 tablet (5 mg total) by mouth every 8 (eight) hours as needed for breakthrough pain.   propranolol 40 MG tablet Commonly known as:  INDERAL TAKE ONE TABLET BY MOUTH TWICE DAILY   thiamine 100 MG tablet Commonly known as:  VITAMIN B-1 Take 100 mg by mouth daily.       Discharge Instructions: Please refer to Patient Instructions section of EMR for full details.  Patient was counseled important signs and symptoms that should prompt return to medical care, changes in medications, dietary instructions, activity restrictions, and follow up appointments.   Follow-Up Appointments:  Contact information for follow-up providers    Marjie Skiff, MD  Follow up.   Specialty:  Family Medicine Contact information: [place] [address] [phone]            Contact information for after-discharge care    [facility] .   Specialty:  [place] information: [address] [place], [place] 07/04/2017, 11:10 AM PGY-1, [place] [facility]

## 2017-07-04 NOTE — Progress Notes (Signed)
Jeanmarie Hubert to be D/C'd Skilled nursing facility per MD order.  Discussed with the patient and all questions fully answered.  VSS, IV catheter discontinued intact. Site without signs and symptoms of complications. Dressing and pressure applied.  Report called to Randell Patient, Therapist, sports at [facility]. All questions answered.  Patient escorted via [facility] and D/C'd to [facility].  Christoper Fabian Drusilla [place] 07/04/2017 12:05 PM

## 2017-07-04 NOTE — Progress Notes (Signed)
Patient will DC to: [facility] Anticipated DC date: 07/04/17 Family notified: Pt alerting family Transport by: [facility] 11:45am   Per MD patient ready for DC to [facility]. RN, patient, patient's family, and facility notified of DC. Discharge Summary sent to facility. Insurance auth received by [employer]. RN given number for report [phone]). DC packet on chart. Ambulance transport requested for patient.   CSW signing off.  Cedric Fishman, [place] Social Worker [phone]

## 2017-07-04 NOTE — Clinical Social Work Placement (Signed)
   CLINICAL SOCIAL WORK PLACEMENT  NOTE  Date:  07/04/2017  Patient Details  Name: William [name] MRN: [id number] Date of Birth: [birth year]  Clinical Social Work is seeking post-discharge placement for this patient at the [place] level of care (*CSW will initial, date and re-position this form in  chart as items are completed):  Yes   Patient/family provided with [place] Work Department's list of facilities offering this level of care within the geographic area requested by the patient (or if unable, by the patient's family).  Yes   Patient/family informed of their freedom to choose among providers that offer the needed level of care, that participate in Medicare, Medicaid or managed care program needed by the patient, have an available bed and are willing to accept the patient.  Yes   Patient/family informed of [place]'s ownership interest in [facility] and [facility], as well as of the fact that they are under no obligation to receive care at these facilities.  PASRR submitted to EDS on       PASRR number received on       Existing PASRR number confirmed on 06/26/17     FL2 transmitted to all facilities in geographic area requested by pt/family on 06/26/17     FL2 transmitted to all facilities within larger geographic area on       Patient informed that his/her managed care company has contracts with or will negotiate with certain facilities, including the following:        Yes   Patient/family informed of bed offers received.  Patient chooses bed at [facility]     Physician recommends and patient chooses bed at      Patient to be transferred to [facility] on 07/04/17.  Patient to be transferred to facility by [facility]     Patient family notified on 07/04/17 of transfer.  Name of family member notified:  N/A     PHYSICIAN       Additional Comment:     _______________________________________________ Benard Halsted, [place] 07/04/2017, 8:00 AM

## 2017-07-21 ENCOUNTER — Telehealth (INDEPENDENT_AMBULATORY_CARE_PROVIDER_SITE_OTHER): Payer: Self-pay | Admitting: Orthopaedic Surgery

## 2017-07-21 NOTE — Telephone Encounter (Signed)
Pt has surgery 06/30/17 per daughter [name] [phone] pt is at rehab facility and he did not have a post op appt with Dr Erlinda Hong. Daughter unsure of when pt should be seen since he is at the rehab facility. When would Xu like for pt to come into office for post op.

## 2017-07-21 NOTE — Telephone Encounter (Signed)
Gave pt appt Monday 07/24/17. Call pt if this is not an appropriate time.

## 2017-07-24 ENCOUNTER — Ambulatory Visit (INDEPENDENT_AMBULATORY_CARE_PROVIDER_SITE_OTHER): Payer: Medicare PPO

## 2017-07-24 ENCOUNTER — Ambulatory Visit (INDEPENDENT_AMBULATORY_CARE_PROVIDER_SITE_OTHER): Payer: Medicare PPO | Admitting: Orthopaedic Surgery

## 2017-07-24 ENCOUNTER — Encounter (INDEPENDENT_AMBULATORY_CARE_PROVIDER_SITE_OTHER): Payer: Self-pay | Admitting: Orthopaedic Surgery

## 2017-07-24 DIAGNOSIS — S42202D Unspecified fracture of upper end of left humerus, subsequent encounter for fracture with routine healing: Secondary | ICD-10-CM | POA: Diagnosis not present

## 2017-07-24 NOTE — Progress Notes (Signed)
Patient is approximately 3 weeks status post operative fixation of left proximal humerus fracture. He denies any significant pain. He is currently at a skilled nursing facility. He has not gotten any physical therapy or occupational therapy. His incision is healed without any signs of infection. He is neurovascularly intact distally. X-ray show stable fixation and alignment of fracture. At this point I would like him to begin gentle range of motion with OT at his SNF so that he can become more independent with his ADLs. I will see him back in 3 weeks with repeat 2 view x-rays of the left shoulder.

## 2017-07-26 ENCOUNTER — Telehealth (INDEPENDENT_AMBULATORY_CARE_PROVIDER_SITE_OTHER): Payer: Self-pay | Admitting: *Deleted

## 2017-07-26 NOTE — Telephone Encounter (Signed)
Received call from pt sister Jolayne Haines stating pt was seen in office for follow up iwht Dr. Erlinda Hong and had sutures taken out and last night he started bleeding a great deal which lasted an hour, she called the on call provider whom had told her to keep it elevated and wrapped and tho contact office in the am. Today there is no bleeding or any signs of infection, just wanted to see if there was anything needed to be concerned about. I suggested keep it wrapped and if see any signs of infection to call us and will get in for appt. Pt voiced understanding and will monitor incision site.

## 2017-07-27 ENCOUNTER — Telehealth (INDEPENDENT_AMBULATORY_CARE_PROVIDER_SITE_OTHER): Payer: Self-pay | Admitting: Orthopaedic Surgery

## 2017-07-27 ENCOUNTER — Telehealth: Payer: Self-pay | Admitting: Family Medicine

## 2017-07-27 NOTE — Telephone Encounter (Signed)
Radovan (OT) with [facility] called needing verbal orders for OT 2 wk 4. Also need Lt shoulder range of motion and weight bearing status. He also asked for any other restrictions the patient may have. The number to contact Radovan is [phone]

## 2017-07-27 NOTE — Telephone Encounter (Signed)
Verbal orders for nurse visits twice a week for one week, then once a week for four weeks. PRN 2 incase of issues. ep

## 2017-07-27 NOTE — Telephone Encounter (Signed)
See message below °

## 2017-07-28 NOTE — Telephone Encounter (Signed)
At visit today, pts ankles, feet and lower legs are swollen 2plus edema.  Please call Tamary with a solution . She is at the pt home and will be there another 30 minutes

## 2017-07-28 NOTE — Telephone Encounter (Signed)
yes

## 2017-07-28 NOTE — Telephone Encounter (Signed)
Called patient and discuss lower extremity swelling reported by home nurse. Advised patient to increase lasix from 40 mg daily to 80 mg daily for the next two days. If no improvement, make need to make an appointment to be seen in clinic on Monday. In the meantime, if he worsens clinically patient should go to the ED for further evaluation. Patient and sister expressed understanding of the plan.  Marjie Skiff, MD [place], PGY-2

## 2017-08-01 ENCOUNTER — Telehealth: Payer: Self-pay | Admitting: Family Medicine

## 2017-08-01 ENCOUNTER — Telehealth (INDEPENDENT_AMBULATORY_CARE_PROVIDER_SITE_OTHER): Payer: Self-pay | Admitting: Orthopaedic Surgery

## 2017-08-01 ENCOUNTER — Other Ambulatory Visit: Payer: Self-pay | Admitting: Family Medicine

## 2017-08-01 NOTE — Telephone Encounter (Signed)
Radovan called asking for verbal orders on weight bearing status, and range of motion. CB # [phone]

## 2017-08-01 NOTE — Telephone Encounter (Signed)
Lenice Pressman would like verbal orders for social worker consult for community resources. ep

## 2017-08-01 NOTE — Telephone Encounter (Signed)
Will forward to MD. Jazmin Hartsell,CMA  

## 2017-08-01 NOTE — Telephone Encounter (Signed)
William [name] is aware. William [name],CMA

## 2017-08-01 NOTE — Telephone Encounter (Signed)
pts sister called about the 3 in 1 chair. It did not work and she took it back to [facility].  She would like to get a e-long chair.  This was recommended by the therapist.  Per [facility] said in order for insurance to pay, an order was needed.  Please advise

## 2017-08-01 NOTE — Telephone Encounter (Signed)
Called pt to advise

## 2017-08-02 ENCOUNTER — Other Ambulatory Visit: Payer: Self-pay | Admitting: Family Medicine

## 2017-08-02 DIAGNOSIS — S42202D Unspecified fracture of upper end of left humerus, subsequent encounter for fracture with routine healing: Secondary | ICD-10-CM

## 2017-08-02 NOTE — Telephone Encounter (Signed)
-----   Message from Marjie Skiff, MD sent at 08/02/2017  2:16 PM EDT ----- William [name],  Please could your let patient know that I placed an order for him to get his requested chair at [facility] to replace that 3-1 chair he did not like.  Marjie Skiff, MD [place], PGY-2

## 2017-08-02 NOTE — Telephone Encounter (Signed)
Called to advise.  

## 2017-08-02 NOTE — Telephone Encounter (Signed)
NWB

## 2017-08-02 NOTE — Telephone Encounter (Signed)
Patient is aware of order has been placed and message sent to Darlina Guys at [facility]. Bevan Disney,CMA

## 2017-08-02 NOTE — Telephone Encounter (Signed)
Please advise 

## 2017-08-03 NOTE — Telephone Encounter (Signed)
Order for elevated toilet seat has been placed by PCP. Darlina Guys at [facility] has been made aware. Hubbard Hartshorn, RN, BSN

## 2017-08-04 ENCOUNTER — Telehealth (INDEPENDENT_AMBULATORY_CARE_PROVIDER_SITE_OTHER): Payer: Self-pay | Admitting: Orthopaedic Surgery

## 2017-08-04 NOTE — Telephone Encounter (Signed)
ok 

## 2017-08-04 NOTE — Telephone Encounter (Signed)
Radovan (OT) with [facility] called advised patient missed (OT) visit yesterday due to being tired and having other appointments today. The number to contact Radovan is [phone]

## 2017-08-04 NOTE — Telephone Encounter (Signed)
See message.

## 2017-08-08 ENCOUNTER — Ambulatory Visit (INDEPENDENT_AMBULATORY_CARE_PROVIDER_SITE_OTHER): Payer: Medicare PPO | Admitting: Family Medicine

## 2017-08-08 ENCOUNTER — Encounter: Payer: Self-pay | Admitting: Family Medicine

## 2017-08-08 VITALS — BP 110/64 | HR 102 | Temp 97.8°F | Ht 71.0 in | Wt 198.0 lb

## 2017-08-08 DIAGNOSIS — S42202D Unspecified fracture of upper end of left humerus, subsequent encounter for fracture with routine healing: Secondary | ICD-10-CM | POA: Diagnosis not present

## 2017-08-08 DIAGNOSIS — G8929 Other chronic pain: Secondary | ICD-10-CM

## 2017-08-08 DIAGNOSIS — M25561 Pain in right knee: Secondary | ICD-10-CM | POA: Diagnosis not present

## 2017-08-08 DIAGNOSIS — M25562 Pain in left knee: Secondary | ICD-10-CM

## 2017-08-08 MED ORDER — IBUPROFEN 600 MG PO TABS: 600.0000 mg | ORAL_TABLET | Freq: Four times a day (QID) | ORAL | 0 refills | Status: DC | PRN

## 2017-08-08 NOTE — Patient Instructions (Addendum)
It was great seeing you today! We have addressed the following issues today  1. Please keep taking your lasix as we talked about. Be careful with salt content in your diet. Use the compression socks as needed and make sure you keep your leg elevated. 2. I will prescribe ibuprofen for your knee pain.  3. I will see you in about a month for follow up or sooner if needed  If we did any lab work today, and the results require attention, either me or my nurse will get in touch with you. If everything is normal, you will get a letter in mail and a message via . If you don't hear from [place] in two weeks, please give [place] a call. Otherwise, we look forward to seeing you again at your next visit. If you have any questions or concerns before then, please call the clinic at [phone].  Please bring all your medications to every doctors visit  Sign up for My Chart to have easy access to your labs results, and communication with your Primary care physician. Please ask Front Desk for some assistance.   Please check-out at the front desk before leaving the clinic.    Take Care,   Dr. Andy Gauss   Chronic Venous Insufficiency Chronic venous insufficiency, also called venous stasis, is a condition that prevents blood from being pumped effectively through the veins in your legs. Blood may no longer be pumped effectively from the legs back to the heart. This condition can range from mild to severe. With proper treatment, you should be able to continue with an active life. What are the causes? Chronic venous insufficiency occurs when the vein walls become stretched, weakened, or damaged, or when valves within the vein are damaged. Some common causes of this include:  High blood pressure inside the veins (venous hypertension).  Increased blood pressure in the leg veins from long periods of sitting or standing.  A blood clot that blocks blood flow in a vein (deep vein thrombosis, DVT).  Inflammation of a vein  (phlebitis) that causes a blood clot to form.  Tumors in the pelvis that cause blood to back up.  What increases the risk? The following factors may make you more likely to develop this condition:  Having a family history of this condition.  Obesity.  Pregnancy.  Living without enough physical activity or exercise (sedentary lifestyle).  Smoking.  Having a job that requires long periods of standing or sitting in one place.  Being a certain age. Women in their 60s and 67s and men in their 62s are more likely to develop this condition.  What are the signs or symptoms? Symptoms of this condition include:  Veins that are enlarged, bulging, or twisted (varicose veins).  Skin breakdown or ulcers.  Reddened or discolored skin on the front of the leg.  Brown, smooth, tight, and painful skin just above the ankle, usually on the inside of the leg (lipodermatosclerosis).  Swelling.  How is this diagnosed? This condition may be diagnosed based on:  Your medical history.  A physical exam.  Tests, such as: ? A procedure that creates an image of a blood vessel and nearby organs and provides information about blood flow through the blood vessel (duplex ultrasound). ? A procedure that tests blood flow (plethysmography). ? A procedure to look at the veins using X-ray and dye (venogram).  How is this treated? The goals of treatment are to help you return to an active life and to  minimize pain or disability. Treatment depends on the severity of your condition, and it may include:  Wearing compression stockings. These can help relieve symptoms and help prevent your condition from getting worse. However, they do not cure the condition.  Sclerotherapy. This is a procedure involving an injection of a material that "dissolves" damaged veins.  Surgery. This may involve: ? Removing a diseased vein (vein stripping). ? Cutting off blood flow through the vein (laser ablation  surgery). ? Repairing a valve.  Follow these instructions at home:  Wear compression stockings as told by your health care provider. These stockings help to prevent blood clots and reduce swelling in your legs.  Take over-the-counter and prescription medicines only as told by your health care provider.  Stay active by exercising, walking, or doing different activities. Ask your health care provider what activities are safe for you and how much exercise you need.  Drink enough fluid to keep your urine clear or pale yellow.  Do not use any products that contain nicotine or tobacco, such as cigarettes and e-cigarettes. If you need help quitting, ask your health care provider.  Keep all follow-up visits as told by your health care provider. This is important. Contact a health care provider if:  You have redness, swelling, or more pain in the affected area.  You see a red streak or line that extends up or down from the affected area.  You have skin breakdown or a loss of skin in the affected area, even if the breakdown is small.  You get an injury in the affected area. Get help right away if:  You get an injury and an open wound in the affected area.  You have severe pain that does not get better with medicine.  You have sudden numbness or weakness in the foot or ankle below the affected area, or you have trouble moving your foot or ankle.  You have a fever and you have worse or persistent symptoms.  You have chest pain.  You have shortness of breath. Summary  Chronic venous insufficiency, also called venous stasis, is a condition that prevents blood from being pumped effectively through the veins in your legs.  Chronic venous insufficiency occurs when the vein walls become stretched, weakened, or damaged, or when valves within the vein are damaged.  Treatment for this condition depends on how severe your condition is, and it may involve wearing compression stockings or having  a procedure.  Make sure you stay active by exercising, walking, or doing different activities. Ask your health care provider what activities are safe for you and how much exercise you need. This information is not intended to replace advice given to you by your health care provider. Make sure you discuss any questions you have with your health care provider. Document Released: 04/17/2007 Document Revised: 10/31/2016 Document Reviewed: 10/31/2016 Elsevier Interactive Patient Education  2017 [employer].

## 2017-08-09 ENCOUNTER — Telehealth: Payer: Self-pay | Admitting: Family Medicine

## 2017-08-09 ENCOUNTER — Other Ambulatory Visit: Payer: Self-pay | Admitting: Family Medicine

## 2017-08-09 DIAGNOSIS — S42202D Unspecified fracture of upper end of left humerus, subsequent encounter for fracture with routine healing: Secondary | ICD-10-CM

## 2017-08-09 NOTE — Telephone Encounter (Signed)
Patient's sister came by to ask if the PCP could call her [phone]) to discuss how to help him request to the insurance company [facility]) that he  have no copays due for visits here. ?

## 2017-08-09 NOTE — Telephone Encounter (Signed)
Santiago Glad with [facility] left voice message on nurse line requesting an order for bath/shower chair. Please call [phone].  Derl Barrow, RN

## 2017-08-09 NOTE — Telephone Encounter (Signed)
Will forward to MD to advise.  I don't know if this is something that we can help with since insurance copay is a part of the contract that patient signs with their insurance company. William [name],CMA

## 2017-08-09 NOTE — Telephone Encounter (Signed)
Talked to patient's sister and provided her with a reference number from [facility]. They will follow up on the claim  Marjie Skiff, MD [place], PGY-2

## 2017-08-09 NOTE — Progress Notes (Signed)
Subjective:    Patient ID: William [name], male    DOB: 1961/01/14, 56 y.o.   MRN: [id number]   CC: Hospital/nursing home discharge follow  HPI: Patient is 56 yo male with a complex past medical history who present today for follow up after a recent hospitalization for a left proximal humerus fracture s/p ORIF. Patient is followed by Ortho ( Dr.XU) and was recently (7/30) seen in his office. Patient is healing without any complications, incision is healed and X ray  shows normal alignment. Patient is scheduled next week with Dr.Xu with repeat imaging. Patient was discharged from nursing home on 7/31 and has started physical therapy yesterday. Patient reports mild right knee pain and feel it is secondary to increase exercise from therapy yesterday. Patient is otherwise feeling better, he is accompanied by two of his sister who have been providing additional support since discharge. Of note, this my fist time official meeting patient as PCP, he is a transfer from [facility]. Visited him socially while he was hospitalized.  Smoking status reviewed   ROS: all other systems were reviewed and are negative other than in the HPI   Past Medical History:  Diagnosis Date  . Anemia   . CHF (congestive heart failure) ([place])   . Chronic kidney disease   . Cirrhosis of liver ([place])   . COMPRESSION FRACTURE, LUMBAR VERTEBRAE 07/29/2010   Qualifier: Diagnosis of  By: Anabel Bene    . Hypertension   . Prostate cancer [facility])     Past Surgical History:  Procedure Laterality Date  . ACHILLES TENDON REPAIR    . ESOPHAGOGASTRODUODENOSCOPY (EGD) WITH PROPOFOL N/A 09/13/2016   Procedure: ESOPHAGOGASTRODUODENOSCOPY (EGD) WITH PROPOFOL;  Surgeon: Wonda Horner, MD;  Location: [facility] ENDOSCOPY;  Service: Endoscopy;  Laterality: N/A;  . ORIF HUMERUS FRACTURE Right 06/30/2017   Procedure: OPEN REDUCTION INTERNAL FIXATION (ORIF) PROXIMAL HUMERUS FRACTURE;  Surgeon: Leandrew Koyanagi, MD;  Location: [facility];  Service:  Orthopedics;  Laterality: Right;    Past medical history, surgical, family, and social history reviewed and updated in the EMR as appropriate.  Objective:  BP 110/64   Pulse (!) 102   Temp 97.8 F (36.6 C) (Oral)   Ht 5\' 11"  (1.803 m)   Wt 198 lb (89.8 kg)   SpO2 99%   BMI 27.62 kg/m   Vitals and nursing note reviewed  General: NAD, pleasant, able to participate in exam Cardiac: RRR, normal heart sounds, no murmurs. 2+ radial and PT pulses bilaterally Respiratory: CTAB, normal effort, No wheezes, rales or rhonchi Abdomen: soft, nontender, nondistended, no hepatic or splenomegaly, +BS Extremities: no edema or cyanosis. WWP. Right knee no swelling or warmth mildly tender on the lateral aspect, good ROM and no crepitus or ligament laxity noted during exam.  left knee exam is normal. Skin: warm and dry, no rashes noted Neuro: alert and oriented x4, no focal deficits Psych: Normal affect and mood   Assessment & Plan:   #Left humerus fracture s/p ORIF, improved  Patient continue healing process with close follow up from ortho. Recently started physical therapy and expect to continue to progress. Reinforce alcohol  cessation given history of abuse and recent fall leading to fracture. Good social support with an understanding of patient needs. Knee pain likely mild inflammation from increased activity with physical therapy in the setting of history of arthritis.  --Follow up on Ortho visit and repeat imaging --Prescribe ibuprofen for mild knee pain  --Continue to take all  medications as prescribed --Keep legs elevated to prevent LE swelling  --Continue low salt diet --Will see patient in about a month to assess progress  Marjie Skiff, MD [place] PGY-2

## 2017-08-10 NOTE — Telephone Encounter (Signed)
Will forward to [facility] team.

## 2017-08-10 NOTE — Telephone Encounter (Signed)
Called patient. States he does not want elevated toilet seat but a BS commode that's specifically for men. Sent message to Darlina Guys at [facility] asking for exact order that needs to be placed. Also, notified her that order for bath/shower chair has been placed in Epic by PCP. Patient does not know how [facility] became involved. He will have his sister, Baltazar Najjar, call us to discuss. Hubbard Hartshorn, RN, BSN

## 2017-08-11 NOTE — Telephone Encounter (Signed)
Spoke with patient's sister, Baltazar Najjar [phone]), states they are wanting a bath chair for a garden tub and a BSC specifically for men that they found at [facility]. States pt receiving PT/OT from  [facility] [phone]). Left message for Jerlyn Ly, OT requesting return call to inform us exact items she recommended so that Rx can written and family can pick up and take to store. Hubbard Hartshorn, RN, BSN

## 2017-08-11 NOTE — Telephone Encounter (Addendum)
William [name], OT returned call [phone]). Patient needs Elongated seat 3-in-1 commode and a tub transfer bench. Rx written with Dx Left proximal humerus 542.202. Sister made aware that Rx is ready for pick up in front office.

## 2017-08-14 ENCOUNTER — Ambulatory Visit (INDEPENDENT_AMBULATORY_CARE_PROVIDER_SITE_OTHER): Payer: Medicare PPO

## 2017-08-14 ENCOUNTER — Encounter (INDEPENDENT_AMBULATORY_CARE_PROVIDER_SITE_OTHER): Payer: Self-pay | Admitting: Orthopaedic Surgery

## 2017-08-14 ENCOUNTER — Ambulatory Visit (INDEPENDENT_AMBULATORY_CARE_PROVIDER_SITE_OTHER): Payer: Medicare PPO | Admitting: Orthopaedic Surgery

## 2017-08-14 DIAGNOSIS — S42202D Unspecified fracture of upper end of left humerus, subsequent encounter for fracture with routine healing: Secondary | ICD-10-CM | POA: Diagnosis not present

## 2017-08-14 NOTE — Progress Notes (Signed)
Patient is 6 weeks status post left proximal humerus fracture. He is overall doing better. His surgical scar is fully healed. He has expected limitation his range of motion. This is improving. X-ray show stable fixation of the fracture with good alignment. He is demonstrating evidence of bony consolidation incorporation of the bone graft. At this point begin therapy for joint mobilization and strengthening. May weight-bear as tolerated. Follow-up in 6 weeks with repeat 2 view x-rays of left shoulder.

## 2017-08-22 ENCOUNTER — Other Ambulatory Visit: Payer: Self-pay | Admitting: *Deleted

## 2017-08-22 MED ORDER — FOLIC ACID 1 MG PO TABS: 1.0000 mg | ORAL_TABLET | Freq: Every day | ORAL | 3 refills | Status: DC

## 2017-08-22 MED ORDER — FUROSEMIDE 20 MG PO TABS: 20.0000 mg | ORAL_TABLET | Freq: Every day | ORAL | 11 refills | Status: DC

## 2017-08-22 MED ORDER — GABAPENTIN 100 MG PO CAPS: 100.0000 mg | ORAL_CAPSULE | Freq: Three times a day (TID) | ORAL | 5 refills | Status: AC

## 2017-08-22 MED ORDER — ALLOPURINOL 100 MG PO TABS: 100.0000 mg | ORAL_TABLET | Freq: Every day | ORAL | 11 refills | Status: DC

## 2017-08-22 MED ORDER — IRON 325 (65 FE) MG PO TABS: 325.0000 mg | ORAL_TABLET | Freq: Every day | ORAL | 0 refills | Status: DC

## 2017-08-22 MED ORDER — MAGNESIUM OXIDE 400 (241.3 MG) MG PO TABS: 800.0000 mg | ORAL_TABLET | Freq: Two times a day (BID) | ORAL | 0 refills | Status: DC

## 2017-08-24 ENCOUNTER — Other Ambulatory Visit: Payer: Self-pay | Admitting: *Deleted

## 2017-08-24 MED ORDER — MAGNESIUM OXIDE 400 (241.3 MG) MG PO TABS
800.0000 mg | ORAL_TABLET | Freq: Two times a day (BID) | ORAL | 0 refills | Status: DC
Start: 2017-08-24 — End: 2017-08-24

## 2017-08-24 MED ORDER — MAGNESIUM OXIDE 400 (241.3 MG) MG PO TABS: 800.0000 mg | ORAL_TABLET | Freq: Two times a day (BID) | ORAL | 0 refills | Status: DC

## 2017-09-01 ENCOUNTER — Telehealth: Payer: Self-pay | Admitting: *Deleted

## 2017-09-01 ENCOUNTER — Other Ambulatory Visit: Payer: Self-pay | Admitting: Family Medicine

## 2017-09-01 NOTE — Telephone Encounter (Signed)
Patient had a question regarding his mag-ox script.  Informed patient that it was just sent into the pharmacy.  Voiced understanding. Rogerio Boutelle,CMA

## 2017-09-01 NOTE — Telephone Encounter (Signed)
Patient left message on nurse line requesting a call back. States he has a question about some of his medications.

## 2017-09-04 ENCOUNTER — Other Ambulatory Visit: Payer: Self-pay | Admitting: Family Medicine

## 2017-09-04 MED ORDER — MAGNESIUM OXIDE 400 (241.3 MG) MG PO TABS
2.0000 | ORAL_TABLET | Freq: Two times a day (BID) | ORAL | 0 refills | Status: DC
Start: 2017-09-04 — End: 2017-10-30

## 2017-09-04 NOTE — Telephone Encounter (Signed)
Medication quantity changed to reflect appropriate amount patient is taking in one day.  Switched from dispense 30 to dispense 120 tabs.  Sister is aware and will pick up tomorrow. Jazmin Hartsell,CMA

## 2017-09-04 NOTE — Addendum Note (Signed)
Addended by: Valerie Roys on: 09/04/2017 04:11 PM   Modules accepted: Orders

## 2017-09-14 ENCOUNTER — Other Ambulatory Visit: Payer: Self-pay | Admitting: *Deleted

## 2017-09-14 DIAGNOSIS — M25561 Pain in right knee: Principal | ICD-10-CM

## 2017-09-14 DIAGNOSIS — G8929 Other chronic pain: Secondary | ICD-10-CM

## 2017-09-14 DIAGNOSIS — M25562 Pain in left knee: Principal | ICD-10-CM

## 2017-09-14 MED ORDER — OMEPRAZOLE 40 MG PO CPDR: 40.0000 mg | DELAYED_RELEASE_CAPSULE | Freq: Every day | ORAL | 11 refills | Status: AC

## 2017-09-14 MED ORDER — IBUPROFEN 600 MG PO TABS: 600.0000 mg | ORAL_TABLET | Freq: Four times a day (QID) | ORAL | 0 refills | Status: DC | PRN

## 2017-09-25 ENCOUNTER — Ambulatory Visit (INDEPENDENT_AMBULATORY_CARE_PROVIDER_SITE_OTHER): Payer: Medicare PPO | Admitting: Orthopaedic Surgery

## 2017-09-26 ENCOUNTER — Other Ambulatory Visit: Payer: Self-pay | Admitting: Family Medicine

## 2017-10-14 ENCOUNTER — Emergency Department (HOSPITAL_COMMUNITY): Payer: Medicare PPO

## 2017-10-14 ENCOUNTER — Inpatient Hospital Stay (HOSPITAL_COMMUNITY)
Admission: EM | Admit: 2017-10-14 | Discharge: 2017-10-20 | DRG: 640 | Disposition: A | Discharge status: Home Health | Payer: Medicare PPO | Attending: Family Medicine | Admitting: Family Medicine

## 2017-10-14 ENCOUNTER — Inpatient Hospital Stay (HOSPITAL_COMMUNITY): Payer: Medicare PPO

## 2017-10-14 ENCOUNTER — Encounter (HOSPITAL_COMMUNITY): Payer: Self-pay | Admitting: *Deleted

## 2017-10-14 DIAGNOSIS — Y906 Blood alcohol level of 120-199 mg/100 ml: Secondary | ICD-10-CM | POA: Diagnosis present

## 2017-10-14 DIAGNOSIS — E872 Acidosis, unspecified: Secondary | ICD-10-CM

## 2017-10-14 DIAGNOSIS — I509 Heart failure, unspecified: Secondary | ICD-10-CM | POA: Diagnosis present

## 2017-10-14 DIAGNOSIS — G629 Polyneuropathy, unspecified: Secondary | ICD-10-CM | POA: Diagnosis present

## 2017-10-14 DIAGNOSIS — R579 Shock, unspecified: Secondary | ICD-10-CM | POA: Diagnosis not present

## 2017-10-14 DIAGNOSIS — I4891 Unspecified atrial fibrillation: Secondary | ICD-10-CM | POA: Diagnosis present

## 2017-10-14 DIAGNOSIS — I130 Hypertensive heart and chronic kidney disease with heart failure and stage 1 through stage 4 chronic kidney disease, or unspecified chronic kidney disease: Secondary | ICD-10-CM | POA: Diagnosis present

## 2017-10-14 DIAGNOSIS — F10229 Alcohol dependence with intoxication, unspecified: Secondary | ICD-10-CM | POA: Diagnosis present

## 2017-10-14 DIAGNOSIS — I8510 Secondary esophageal varices without bleeding: Secondary | ICD-10-CM | POA: Diagnosis present

## 2017-10-14 DIAGNOSIS — E871 Hypo-osmolality and hyponatremia: Secondary | ICD-10-CM | POA: Diagnosis present

## 2017-10-14 DIAGNOSIS — N170 Acute kidney failure with tubular necrosis: Secondary | ICD-10-CM | POA: Diagnosis present

## 2017-10-14 DIAGNOSIS — D6489 Other specified anemias: Secondary | ICD-10-CM | POA: Diagnosis present

## 2017-10-14 DIAGNOSIS — D61818 Other pancytopenia: Secondary | ICD-10-CM | POA: Diagnosis present

## 2017-10-14 DIAGNOSIS — Z8546 Personal history of malignant neoplasm of prostate: Secondary | ICD-10-CM

## 2017-10-14 DIAGNOSIS — E162 Hypoglycemia, unspecified: Secondary | ICD-10-CM | POA: Diagnosis present

## 2017-10-14 DIAGNOSIS — R161 Splenomegaly, not elsewhere classified: Secondary | ICD-10-CM | POA: Diagnosis present

## 2017-10-14 DIAGNOSIS — I81 Portal vein thrombosis: Secondary | ICD-10-CM | POA: Diagnosis present

## 2017-10-14 DIAGNOSIS — G9341 Metabolic encephalopathy: Secondary | ICD-10-CM | POA: Diagnosis present

## 2017-10-14 DIAGNOSIS — M109 Gout, unspecified: Secondary | ICD-10-CM | POA: Diagnosis present

## 2017-10-14 DIAGNOSIS — R0902 Hypoxemia: Secondary | ICD-10-CM | POA: Diagnosis not present

## 2017-10-14 DIAGNOSIS — A419 Sepsis, unspecified organism: Secondary | ICD-10-CM | POA: Diagnosis present

## 2017-10-14 DIAGNOSIS — R6520 Severe sepsis without septic shock: Secondary | ICD-10-CM | POA: Diagnosis present

## 2017-10-14 DIAGNOSIS — K7030 Alcoholic cirrhosis of liver without ascites: Secondary | ICD-10-CM | POA: Diagnosis present

## 2017-10-14 DIAGNOSIS — Z841 Family history of disorders of kidney and ureter: Secondary | ICD-10-CM

## 2017-10-14 DIAGNOSIS — E43 Unspecified severe protein-calorie malnutrition: Secondary | ICD-10-CM | POA: Diagnosis present

## 2017-10-14 DIAGNOSIS — E860 Dehydration: Secondary | ICD-10-CM | POA: Diagnosis present

## 2017-10-14 DIAGNOSIS — E875 Hyperkalemia: Secondary | ICD-10-CM | POA: Diagnosis present

## 2017-10-14 DIAGNOSIS — N179 Acute kidney failure, unspecified: Secondary | ICD-10-CM

## 2017-10-14 DIAGNOSIS — D6959 Other secondary thrombocytopenia: Secondary | ICD-10-CM | POA: Diagnosis present

## 2017-10-14 DIAGNOSIS — Z8249 Family history of ischemic heart disease and other diseases of the circulatory system: Secondary | ICD-10-CM

## 2017-10-14 DIAGNOSIS — K7290 Hepatic failure, unspecified without coma: Secondary | ICD-10-CM | POA: Diagnosis present

## 2017-10-14 DIAGNOSIS — K766 Portal hypertension: Secondary | ICD-10-CM | POA: Diagnosis present

## 2017-10-14 DIAGNOSIS — R000 Tachycardia, unspecified: Secondary | ICD-10-CM | POA: Diagnosis present

## 2017-10-14 DIAGNOSIS — Z79899 Other long term (current) drug therapy: Secondary | ICD-10-CM

## 2017-10-14 DIAGNOSIS — K8020 Calculus of gallbladder without cholecystitis without obstruction: Secondary | ICD-10-CM | POA: Diagnosis present

## 2017-10-14 DIAGNOSIS — K7011 Alcoholic hepatitis with ascites: Secondary | ICD-10-CM | POA: Diagnosis present

## 2017-10-14 DIAGNOSIS — G9340 Encephalopathy, unspecified: Secondary | ICD-10-CM | POA: Diagnosis present

## 2017-10-14 DIAGNOSIS — Z6826 Body mass index (BMI) 26.0-26.9, adult: Secondary | ICD-10-CM

## 2017-10-14 DIAGNOSIS — N183 Chronic kidney disease, stage 3 (moderate): Secondary | ICD-10-CM | POA: Diagnosis present

## 2017-10-14 DIAGNOSIS — E876 Hypokalemia: Secondary | ICD-10-CM | POA: Diagnosis not present

## 2017-10-14 DIAGNOSIS — R739 Hyperglycemia, unspecified: Secondary | ICD-10-CM | POA: Diagnosis present

## 2017-10-14 DIAGNOSIS — D631 Anemia in chronic kidney disease: Secondary | ICD-10-CM | POA: Diagnosis present

## 2017-10-14 HISTORY — DX: Personal history of other medical treatment: Z92.89

## 2017-10-14 HISTORY — DX: Alcohol abuse, uncomplicated: F10.10

## 2017-10-14 HISTORY — DX: Gastro-esophageal reflux disease without esophagitis: K21.9

## 2017-10-14 HISTORY — DX: Unspecified osteoarthritis, unspecified site: M19.90

## 2017-10-14 HISTORY — DX: Gout, unspecified: M10.9

## 2017-10-14 LAB — RAPID URINE DRUG SCREEN, HOSP PERFORMED
Amphetamines: NOT DETECTED
BENZODIAZEPINES: NOT DETECTED
Barbiturates: NOT DETECTED
COCAINE: NOT DETECTED
Opiates: NOT DETECTED
Tetrahydrocannabinol: NOT DETECTED

## 2017-10-14 LAB — I-STAT VENOUS BLOOD GAS, ED
Acid-base deficit: 24 mmol/L — ABNORMAL HIGH (ref 0.0–2.0)
Bicarbonate: 5.2 mmol/L — ABNORMAL LOW (ref 20.0–28.0)
O2 SAT: 73 %
PCO2 VEN: 19.7 mmHg — AB (ref 44.0–60.0)
TCO2: 6 mmol/L — ABNORMAL LOW (ref 22–32)
pH, Ven: 7.03 — CL (ref 7.250–7.430)
pO2, Ven: 55 mmHg — ABNORMAL HIGH (ref 32.0–45.0)

## 2017-10-14 LAB — URINALYSIS, ROUTINE W REFLEX MICROSCOPIC
Bilirubin Urine: NEGATIVE
GLUCOSE, UA: NEGATIVE mg/dL
KETONES UR: NEGATIVE mg/dL
Leukocytes, UA: NEGATIVE
NITRITE: NEGATIVE
PH: 5 (ref 5.0–8.0)
Protein, ur: 100 mg/dL — AB
SPECIFIC GRAVITY, URINE: 1.015 (ref 1.005–1.030)

## 2017-10-14 LAB — CBC WITH DIFFERENTIAL/PLATELET
BASOS ABS: 0 10*3/uL (ref 0.0–0.1)
BASOS PCT: 0 %
EOS ABS: 0 10*3/uL (ref 0.0–0.7)
Eosinophils Relative: 0 %
HCT: 29.6 % — ABNORMAL LOW (ref 39.0–52.0)
Hemoglobin: 10.1 g/dL — ABNORMAL LOW (ref 13.0–17.0)
LYMPHS PCT: 11 %
Lymphs Abs: 0.7 10*3/uL (ref 0.7–4.0)
MCH: 33.9 pg (ref 26.0–34.0)
MCHC: 34.1 g/dL (ref 30.0–36.0)
MCV: 99.3 fL (ref 78.0–100.0)
MONO ABS: 0.3 10*3/uL (ref 0.1–1.0)
Monocytes Relative: 5 %
NEUTROS ABS: 5.3 10*3/uL (ref 1.7–7.7)
NEUTROS PCT: 84 %
Platelets: 48 10*3/uL — ABNORMAL LOW (ref 150–400)
RBC: 2.98 MIL/uL — ABNORMAL LOW (ref 4.22–5.81)
RDW: 15.8 % — AB (ref 11.5–15.5)
WBC Morphology: INCREASED
WBC: 6.3 10*3/uL (ref 4.0–10.5)

## 2017-10-14 LAB — I-STAT ARTERIAL BLOOD GAS, ED
ACID-BASE DEFICIT: 21 mmol/L — AB (ref 0.0–2.0)
Bicarbonate: 5.4 mmol/L — ABNORMAL LOW (ref 20.0–28.0)
O2 SAT: 99 %
PH ART: 7.189 — AB (ref 7.350–7.450)
Patient temperature: 35
TCO2: 6 mmol/L — ABNORMAL LOW (ref 22–32)
pCO2 arterial: <15 mmHg — CL (ref 32.0–48.0)
pO2, Arterial: 148 mmHg — ABNORMAL HIGH (ref 83.0–108.0)

## 2017-10-14 LAB — COMPREHENSIVE METABOLIC PANEL
ALT: 82 U/L — ABNORMAL HIGH (ref 17–63)
AST: 291 U/L — ABNORMAL HIGH (ref 15–41)
Albumin: 2.9 g/dL — ABNORMAL LOW (ref 3.5–5.0)
Alkaline Phosphatase: 175 U/L — ABNORMAL HIGH (ref 38–126)
BUN: 15 mg/dL (ref 6–20)
CHLORIDE: 91 mmol/L — AB (ref 101–111)
Calcium: 8 mg/dL — ABNORMAL LOW (ref 8.9–10.3)
Creatinine, Ser: 2.33 mg/dL — ABNORMAL HIGH (ref 0.61–1.24)
GFR calc non Af Amer: 29 mL/min — ABNORMAL LOW (ref >60)
GFR, EST AFRICAN AMERICAN: 34 mL/min — AB (ref >60)
GLUCOSE: 78 mg/dL (ref 65–99)
POTASSIUM: 3.6 mmol/L (ref 3.5–5.1)
SODIUM: 130 mmol/L — AB (ref 135–145)
Total Bilirubin: 6.3 mg/dL — ABNORMAL HIGH (ref 0.3–1.2)
Total Protein: 8.1 g/dL (ref 6.5–8.1)

## 2017-10-14 LAB — ETHANOL: ALCOHOL ETHYL (B): 166 mg/dL — AB (ref <10)

## 2017-10-14 LAB — I-STAT TROPONIN, ED: Troponin i, poc: 0.01 ng/mL (ref 0.00–0.08)

## 2017-10-14 LAB — I-STAT CG4 LACTIC ACID, ED: Lactic Acid, Venous: >17 mmol/L (ref 0.5–1.9)

## 2017-10-14 LAB — PROTIME-INR
INR: 1.72
Prothrombin Time: 20 seconds — ABNORMAL HIGH (ref 11.4–15.2)

## 2017-10-14 LAB — AMYLASE: Amylase: 83 U/L (ref 28–100)

## 2017-10-14 LAB — CBG MONITORING, ED
GLUCOSE-CAPILLARY: 80 mg/dL (ref 65–99)
GLUCOSE-CAPILLARY: 70 mg/dL (ref 65–99)
Glucose-Capillary: 89 mg/dL (ref 65–99)
Glucose-Capillary: 81 mg/dL (ref 65–99)

## 2017-10-14 LAB — GLUCOSE, CAPILLARY: Glucose-Capillary: 73 mg/dL (ref 65–99)

## 2017-10-14 LAB — PROCALCITONIN: Procalcitonin: 0.85 ng/mL

## 2017-10-14 LAB — LACTIC ACID, PLASMA: Lactic Acid, Venous: 19.5 mmol/L (ref 0.5–1.9)

## 2017-10-14 LAB — LIPASE, BLOOD: Lipase: 85 U/L — ABNORMAL HIGH (ref 11–51)

## 2017-10-14 LAB — AMMONIA: AMMONIA: 113 umol/L — AB (ref 9–35)

## 2017-10-14 LAB — CK: Total CK: 282 U/L (ref 49–397)

## 2017-10-14 LAB — ACETAMINOPHEN LEVEL

## 2017-10-14 LAB — SALICYLATE LEVEL: Salicylate Lvl: <7 mg/dL (ref 2.8–30.0)

## 2017-10-14 LAB — OSMOLALITY: OSMOLALITY: 331 mosm/kg — AB (ref 275–295)

## 2017-10-14 LAB — MAGNESIUM: MAGNESIUM: 1 mg/dL — AB (ref 1.7–2.4)

## 2017-10-14 MED ORDER — PIPERACILLIN-TAZOBACTAM 3.375 G IVPB
3.3750 g | Freq: Three times a day (TID) | INTRAVENOUS | Status: DC
  Administered 2017-10-15 – 2017-10-18 (×11): 3.375 g via INTRAVENOUS
  Filled 2017-10-14 (×12): qty 50

## 2017-10-14 MED ORDER — LACTATED RINGERS IV BOLUS (SEPSIS)
1000.0000 mL | Freq: Once | INTRAVENOUS | Status: AC
  Administered 2017-10-14: 1000 mL via INTRAVENOUS

## 2017-10-14 MED ORDER — VANCOMYCIN HCL 10 G IV SOLR
1500.0000 mg | Freq: Once | INTRAVENOUS | Status: DC
  Filled 2017-10-14: qty 1500

## 2017-10-14 MED ORDER — MAGNESIUM SULFATE 2 GM/50ML IV SOLN
2.0000 g | Freq: Once | INTRAVENOUS | Status: AC
  Administered 2017-10-14: 2 g via INTRAVENOUS
  Filled 2017-10-14: qty 50

## 2017-10-14 MED ORDER — SODIUM BICARBONATE 8.4 % IV SOLN
INTRAVENOUS | Status: DC
  Administered 2017-10-14 – 2017-10-16 (×4): via INTRAVENOUS
  Filled 2017-10-14 (×6): qty 150

## 2017-10-14 MED ORDER — PIPERACILLIN-TAZOBACTAM 3.375 G IVPB 30 MIN
3.3750 g | INTRAVENOUS | Status: AC
  Administered 2017-10-14: 3.375 g via INTRAVENOUS
  Filled 2017-10-14: qty 50

## 2017-10-14 MED ORDER — SODIUM CHLORIDE 0.9 % IV BOLUS (SEPSIS): 1000.0000 mL | Freq: Once | INTRAVENOUS | Status: DC

## 2017-10-14 MED ORDER — LORAZEPAM 2 MG/ML IJ SOLN
1.0000 mg | Freq: Once | INTRAMUSCULAR | Status: AC
  Administered 2017-10-14: 1 mg via INTRAVENOUS
  Filled 2017-10-14: qty 1

## 2017-10-14 MED ORDER — DEXTROSE-NACL 5-0.45 % IV SOLN
INTRAVENOUS | Status: DC
  Administered 2017-10-14: 75 mL/h via INTRAVENOUS

## 2017-10-14 MED ORDER — DILTIAZEM HCL 100 MG IV SOLR
5.0000 mg/h | INTRAVENOUS | Status: DC
  Administered 2017-10-14: 5 mg/h via INTRAVENOUS
  Administered 2017-10-15: 10 mg/h via INTRAVENOUS
  Filled 2017-10-14 (×2): qty 100

## 2017-10-14 MED ORDER — VANCOMYCIN HCL 10 G IV SOLR
1250.0000 mg | INTRAVENOUS | Status: DC
  Administered 2017-10-15 – 2017-10-16 (×2): 1250 mg via INTRAVENOUS
  Filled 2017-10-14 (×3): qty 1250

## 2017-10-14 MED ORDER — SODIUM CHLORIDE 0.9 % IV SOLN
250.0000 mL | INTRAVENOUS | Status: DC | PRN
  Administered 2017-10-14: 250 mL via INTRAVENOUS

## 2017-10-14 MED ORDER — LACTULOSE ENEMA
300.0000 mL | Freq: Three times a day (TID) | ORAL | Status: DC
  Administered 2017-10-15: 300 mL via RECTAL
  Filled 2017-10-14 (×2): qty 300

## 2017-10-14 MED ORDER — THIAMINE HCL 100 MG/ML IJ SOLN
Freq: Once | INTRAVENOUS | Status: AC
  Administered 2017-10-14: 19:00:00 via INTRAVENOUS
  Filled 2017-10-14: qty 1000

## 2017-10-14 MED ORDER — THIAMINE HCL 100 MG/ML IJ SOLN
100.0000 mg | Freq: Every day | INTRAMUSCULAR | Status: DC
  Administered 2017-10-15 – 2017-10-16 (×2): 100 mg via INTRAVENOUS
  Filled 2017-10-14 (×2): qty 2

## 2017-10-14 MED ORDER — PANTOPRAZOLE SODIUM 40 MG IV SOLR
40.0000 mg | INTRAVENOUS | Status: DC
  Administered 2017-10-15 – 2017-10-16 (×2): 40 mg via INTRAVENOUS
  Filled 2017-10-14 (×2): qty 40

## 2017-10-14 MED ORDER — VANCOMYCIN HCL IN DEXTROSE 1-5 GM/200ML-% IV SOLN: 1000.0000 mg | Freq: Once | INTRAVENOUS | Status: DC

## 2017-10-14 MED ORDER — FOLIC ACID 5 MG/ML IJ SOLN
1.0000 mg | Freq: Every day | INTRAMUSCULAR | Status: DC
  Administered 2017-10-15 – 2017-10-16 (×2): 1 mg via INTRAVENOUS
  Filled 2017-10-14 (×3): qty 0.2

## 2017-10-14 MED ORDER — VANCOMYCIN HCL IN DEXTROSE 1-5 GM/200ML-% IV SOLN
1000.0000 mg | Freq: Once | INTRAVENOUS | Status: AC
  Administered 2017-10-14: 1000 mg via INTRAVENOUS
  Filled 2017-10-14: qty 200

## 2017-10-14 MED ORDER — SODIUM BICARBONATE 8.4 % IV SOLN
50.0000 meq | Freq: Once | INTRAVENOUS | Status: AC
  Administered 2017-10-14: 50 meq via INTRAVENOUS
  Filled 2017-10-14: qty 50

## 2017-10-14 MED ORDER — LACTULOSE ENEMA
300.0000 mL | Freq: Once | RECTAL | Status: AC
Start: 2017-10-14 — End: 2017-10-15
  Administered 2017-10-15: 300 mL via RECTAL
  Filled 2017-10-14: qty 300

## 2017-10-14 NOTE — ED Provider Notes (Signed)
[facility] NEURO/TRAUMA/SURGICAL ICU Provider Note   CSN: [id number] Arrival date & time: 10/14/17  1712     History   Chief Complaint Chief Complaint  Patient presents with  . Hypoglycemia  . Altered Mental Status    HPI William [name] is a 56 y.o. male.  HPI  56 year old male with past medical history as below including cirrhosis and alcohol abuse who presents with hypoglycemia and altered mental status. The patient reportedly was with his family earlier today. He was noted to be more confused and somnolent than usual. He was noted to be tremulous and started shaking while sitting down. He was then minimally unresponsive. According to EMS report, there were called to the scene and the patient was nonverbal at the time.he had a blood sugar of 19 and was given D50 with improvement to 260 prior to arrival. On arrival, the patient is able to state his name only. He is unable to follow commands. He is significantly confused.  Level 5 caveat invoked as remainder of history, ROS, and physical exam limited due to patient's mental status change.   Past Medical History:  Diagnosis Date  . Anemia   . CHF (congestive heart failure) ([place])   . Chronic kidney disease   . Cirrhosis of liver ([place])   . COMPRESSION FRACTURE, LUMBAR VERTEBRAE 07/29/2010   Qualifier: Diagnosis of  By: Anabel Bene    . ETOH abuse   . Hypertension   . Prostate cancer [facility])     Patient Active Problem List   Diagnosis Date Noted  . Acute encephalopathy 10/14/2017  . Bleeding   . Fracture   . Closed fracture of left proximal humerus 06/30/2017  . Leg swelling 05/29/2017  . Hypothyroidism 05/29/2017  . Tongue ulcer   . Hyponatremia   . Acute kidney injury (nontraumatic) ([place])   . Anemia 05/17/2017  . Dark urine 09/16/2016  . Diarrhea   . Colon wall thickening   . Anemia due to bone marrow failure ([place])   . Malnutrition of moderate degree 09/10/2016  . Unintentional weight loss   . Dehydration    . Hypomagnesemia   . Hypokalemia   . Hypocalcemia   . Vomiting, persistent, in adult   . Intractable hiccups   . Decreased appetite 09/09/2016  . Symptomatic anemia 09/09/2016  . Chronic pain 02/08/2016  . Pancytopenia ([place]) 05/29/2015  . History of esophageal varices 08/20/2012  . Shoulder pain, right 06/27/2012  . Gout 06/27/2012  . MGUS (monoclonal gammopathy of unknown significance) 06/15/2012  . Hypertension 02/28/2012  . Cervical pain (neck) 01/16/2012  . Alcohol abuse 01/16/2012  . Chronic kidney disease (CKD), stage III (moderate) ([place]) 12/13/2011  . Erectile dysfunction 12/13/2011  . Back pain 08/11/2011  . Shoulder pain, left 02/07/2011  . Knee pain 02/07/2011  . Cirrhosis ([place]) 08/04/2010  . Alcoholic cirrhosis of liver ([place]) 02/23/2010    Past Surgical History:  Procedure Laterality Date  . ACHILLES TENDON REPAIR    . ESOPHAGOGASTRODUODENOSCOPY (EGD) WITH PROPOFOL N/A 09/13/2016   Procedure: ESOPHAGOGASTRODUODENOSCOPY (EGD) WITH PROPOFOL;  Surgeon: Wonda Horner, MD;  Location[facility] END[facility]COPY;  Service: Endoscopy;  Laterality: N/A;  . ORIF HUMERUS FRACTURE Right 06/30/2017   Procedure: OPEN REDUCTION INTERNAL FIXATION (ORIF) PROXIMAL HUMERUS FRACTURE;  Surgeon: Leandrew Koyanagi, MD;  Location: [facility];  Service: Orthopedics;  Laterality: Right;       Home Medications    Prior to Admission medications   Medication Sig Start Date End Date Taking? Authorizing  Provider  allopurinol (ZYLOPRIM) 100 MG tablet Take 1 tablet (100 mg total) by mouth daily. 08/22/17  Yes Diallo, Abdoulaye, MD  Ferrous Sulfate (IRON) 325 (65 Fe) MG TABS TAKE 1 TABLET BY MOUTH ONCE DAILY 09/26/17  Yes Diallo, Abdoulaye, MD  folic acid (FOLVITE) 1 MG tablet Take 1 tablet (1 mg total) by mouth daily. 08/22/17  Yes Diallo, Abdoulaye, MD  gabapentin (NEURONTIN) 100 MG capsule Take 1 capsule (100 mg total) by mouth 3 (three) times daily. 08/22/17  Yes Diallo, Abdoulaye, MD  ibuprofen (ADVIL,MOTRIN) 600 MG  tablet Take 1 tablet (600 mg total) by mouth every 6 (six) hours as needed for mild pain or moderate pain. 09/14/17  Yes Diallo, Abdoulaye, MD  magnesium oxide (MAG-OX) 400 (241.3 Mg) MG tablet Take 2 tablets (800 mg total) by mouth 2 (two) times daily. 09/04/17  Yes Diallo, Abdoulaye, MD  omeprazole (PRILOSEC) 40 MG capsule Take 1 capsule (40 mg total) by mouth daily. 09/14/17  Yes Diallo, Abdoulaye, MD  oxyCODONE (OXY IR/ROXICODONE) 5 MG immediate release tablet Take 1 tablet (5 mg total) by mouth every 8 (eight) hours as needed for breakthrough pain. 07/03/17  Yes Verner Mould, MD  propranolol (INDERAL) 40 MG tablet TAKE ONE TABLET BY MOUTH TWICE DAILY Patient taking differently: TAKE 40 mg TABLET BY MOUTH TWICE DAILY 08/09/16  Yes Vivi Barrack, MD  thiamine (VITAMIN B-1) 100 MG tablet Take 100 mg by mouth daily.   Yes [provider]  furosemide (LASIX) 20 MG tablet Take 1 tablet (20 mg total) by mouth daily. Patient not taking: Reported on 10/14/2017 08/22/17   Marjie Skiff, MD  magnesium oxide (MAG-OX) 400 (241.3 Mg) MG tablet TAKE 2 TABLETS BY MOUTH TWICE DAILY Patient not taking: Reported on 10/14/2017 09/01/17   Marjie Skiff, MD  Magnesium Oxide 400 (240 Mg) MG TABS TAKE 2 TABLETS BY MOUTH TWICE DAILY Patient not taking: Reported on 10/14/2017 09/26/17   Marjie Skiff, MD    Family History Family History  Problem Relation Age of Onset  . Kidney disease Mother   . Arthritis Mother   . Hypertension Father     Social History Social History  Substance Use Topics  . Smoking status: Never Smoker  . Smokeless tobacco: Never Used  . Alcohol use Yes     Comment: 1 pint per week     Allergies   No known allergies   Review of Systems Review of Systems  Unable to perform ROS: Mental status change     Physical Exam Updated Vital Signs BP (!) 95/54   Pulse (!) 109   Temp (!) 97.2 F (36.2 C)   Resp (!) 22   Ht 5\' 11"  (1.803 m)   Wt 87 kg (191  lb 12.8 oz)   SpO2 98%   BMI 26.75 kg/m   Physical Exam  Constitutional: He appears well-developed. No distress.  HENT:  Head: Normocephalic and atraumatic.  Dry MM  Eyes: Conjunctivae are normal.  Neck: Neck supple.  Cardiovascular: Regular rhythm and normal heart sounds.  Tachycardia present.  Exam reveals no friction rub.   No murmur heard. Pulmonary/Chest: Effort normal and breath sounds normal. No respiratory distress. He has no wheezes. He has no rales.  Abdominal: Soft. He exhibits no distension. There is no tenderness.  Musculoskeletal: He exhibits no edema.  Neurological:  Confused, but oriented to person. Follows commands. Poor concentration. Speech delayed. Face symmetric. Tongue midline. MAE with 5/5 strength. Endorses normal sensation to light touch b/l  UE and LE. Gait deferred. Mild tremor noted b/l UE at rest.  Skin: Skin is warm. Capillary refill takes less than 2 seconds.  Nursing note and vitals reviewed.    ED Treatments / Results  Labs (all labs ordered are listed, but only abnormal results are displayed) Labs Reviewed  CBC WITH DIFFERENTIAL/PLATELET - Abnormal; Notable for the following:       Result Value   RBC 2.98 (*)    Hemoglobin 10.1 (*)    HCT 29.6 (*)    RDW 15.8 (*)    Platelets 48 (*)    All other components within normal limits  COMPREHENSIVE METABOLIC PANEL - Abnormal; Notable for the following:    Sodium 130 (*)    Chloride 91 (*)    CO2 <7 (*)    Creatinine, Ser 2.33 (*)    Calcium 8.0 (*)    Albumin 2.9 (*)    AST 291 (*)    ALT 82 (*)    Alkaline Phosphatase 175 (*)    Total Bilirubin 6.3 (*)    GFR calc non Af Amer 29 (*)    GFR calc Af Amer 34 (*)    All other components within normal limits  ETHANOL - Abnormal; Notable for the following:    Alcohol, Ethyl (B) 166 (*)    All other components within normal limits  ACETAMINOPHEN LEVEL - Abnormal; Notable for the following:    Acetaminophen (Tylenol), Serum <10 (*)    All  other components within normal limits  AMMONIA - Abnormal; Notable for the following:    Ammonia 113 (*)    All other components within normal limits  URINALYSIS, ROUTINE W REFLEX MICROSCOPIC - Abnormal; Notable for the following:    Color, Urine AMBER (*)    APPearance HAZY (*)    Hgb urine dipstick MODERATE (*)    Protein, ur 100 (*)    Bacteria, UA RARE (*)    Squamous Epithelial / LPF 0-5 (*)    All other components within normal limits  PROTIME-INR - Abnormal; Notable for the following:    Prothrombin Time 20.0 (*)    All other components within normal limits  OSMOLALITY - Abnormal; Notable for the following:    Osmolality 331 (*)    All other components within normal limits  LACTIC ACID, PLASMA - Abnormal; Notable for the following:    Lactic Acid, Venous 19.5 (*)    All other components within normal limits  LIPASE, BLOOD - Abnormal; Notable for the following:    Lipase 85 (*)    All other components within normal limits  MAGNESIUM - Abnormal; Notable for the following:    Magnesium 1.0 (*)    All other components within normal limits  CBC - Abnormal; Notable for the following:    WBC 3.1 (*)    RBC 2.68 (*)    Hemoglobin 8.8 (*)    HCT 26.6 (*)    RDW 15.8 (*)    Platelets 35 (*)    All other components within normal limits  AMMONIA - Abnormal; Notable for the following:    Ammonia 94 (*)    All other components within normal limits  BRAIN NATRIURETIC PEPTIDE - Abnormal; Notable for the following:    B Natriuretic Peptide 182.3 (*)    All other components within normal limits  BLOOD GAS, ARTERIAL - Abnormal; Notable for the following:    pH, Arterial 7.232 (*)    Bicarbonate 6.2 (*)    Acid-base  deficit 20.5 (*)    All other components within normal limits  I-STAT CG4 LACTIC ACID, ED - Abnormal; Notable for the following:    Lactic Acid, Venous >17.00 (*)    All other components within normal limits  I-STAT VENOUS BLOOD GAS, ED - Abnormal; Notable for the  following:    pH, Ven 7.030 (*)    pCO2, Ven 19.7 (*)    pO2, Ven 55.0 (*)    Bicarbonate 5.2 (*)    TCO2 6 (*)    Acid-base deficit 24.0 (*)    All other components within normal limits  I-STAT ARTERIAL BLOOD GAS, ED - Abnormal; Notable for the following:    pH, Arterial 7.189 (*)    pCO2 arterial <15.0 (*)    pO2, Arterial 148.0 (*)    Bicarbonate 5.4 (*)    TCO2 6 (*)    Acid-base deficit 21.0 (*)    All other components within normal limits  CULTURE, BLOOD (ROUTINE X 2)  CULTURE, BLOOD (ROUTINE X 2)  URINE CULTURE  MRSA PCR SCREENING  RAPID URINE DRUG SCREEN, HOSP PERFORMED  SALICYLATE LEVEL  PROCALCITONIN  AMYLASE  CK  GLUCOSE, CAPILLARY  GLUCOSE, CAPILLARY  LACTIC ACID, PLASMA  PROCALCITONIN  BASIC METABOLIC PANEL  MAGNESIUM  PHOSPHORUS  HEPATIC FUNCTION PANEL  TROPONIN I  SODIUM, URINE, RANDOM  HEMOGLOBIN A1C  TSH  CBG MONITORING, ED  I-STAT TROPONIN, ED  CBG MONITORING, ED  CBG MONITORING, ED  CBG MONITORING, ED    EKG  EKG Interpretation  Date/Time:  Saturday October 14 2017 19:55:34 EDT Ventricular Rate:  140 PR Interval:    QRS Duration: 81 QT Interval:  338 QTC Calculation: 516 R Axis:   58 Text Interpretation:  Junctional tachycardia, question SVT versus EAT Prolonged QT interval Confirmed by Duffy Bruce [phone]) on 10/15/2017 12:03:28 AM       Radiology Ct Head Wo Contrast  Result Date: 10/14/2017 CLINICAL DATA:  Altered level of consciousness, hypertension and history prostate cancer. EXAM: CT HEAD WITHOUT CONTRAST TECHNIQUE: Contiguous axial images were obtained from the base of the skull through the vertex without intravenous contrast. COMPARISON:  05/18/2010 FINDINGS: Brain: Superficial and central atrophy with chronic appearing small vessel ischemic disease. No large vascular territory infarct, hemorrhage or midline shift. No intra-axial mass nor extra-axial fluid collections. Vascular: Mild-to-moderate atherosclerosis of the  carotid siphons. Skull: No acute skull fracture or suspicious osseous lesions. Sinuses/Orbits: Circumferential mild-to-moderate mucosal thickening of the maxillary and sphenoid sinuses consistent chronic paranasal sinusitis. Clear mastoids. Motion artifacts limit assessment at the skullbase. Other: None IMPRESSION: 1. Atrophy with chronic appearing small vessel ischemic disease. 2. No acute intracranial abnormality. 3. Chronic paranasal sinusitis predominantly involving the sphenoid and maxillary sinuses. Electronically Signed   By: Ashley Royalty M.D.   On: 10/14/2017 19:04   US Abdomen Complete  Result Date: 10/14/2017 CLINICAL DATA:  Right upper quadrant pain. History of splenomegaly, hypertension, prostate cancer, hepatic cirrhosis, cholelithiasis. EXAM: ABDOMEN ULTRASOUND COMPLETE COMPARISON:  CT abdomen and pelvis 06/27/2017 FINDINGS: Gallbladder: Gallbladder is distended. Multiple small stones in the gallbladder layering along the dependent surface. No gallbladder wall thickening or edema. Murphy's sign is negative. Common bile duct: Diameter: 6.7 mm, normal Liver: Changes of hepatic cirrhosis with diffuse heterogeneous parenchymal echotexture and nodular contour. Free fluid is demonstrated around the liver edge consistent with ascites. No focal lesions are identified but poor penetration limits evaluation. Color flow Doppler imaging of the main portal vein shows no flow in the mid and  distal portal vein with segmental flow demonstrated in the proximal portal vein demonstrated flow reversal. IVC: No abnormality visualized. Pancreas: Visualized portion unremarkable. Spleen: Spleen is enlarged with spleen length measuring 13.5 cm. Calculated volume is 774 mL. Right Kidney: Length: 11.1 cm. Echogenicity within normal limits. No mass or hydronephrosis visualized. Left Kidney: Length: 11.9 cm. Visualization is limited due to poor penetration. Echogenicity appears within normal limits. No mass or hydronephrosis  visualized. Abdominal aorta: No aneurysm visualized. Other findings: Examination is somewhat technically limited due to patient's limited mobility, body habitus, and bowel gas. IMPRESSION: 1. Cholelithiasis with distended gallbladder. No additional changes to suggest cholecystitis. 2. Changes of hepatic cirrhosis with portal venous hypertension. 3. Partial portal vein thrombosis with reversal of flow in the proximal portal vein. 4. Splenic enlargement. Electronically Signed   By: Lucienne Capers M.D.   On: 10/14/2017 22:28   Dg Chest Portable 1 View  Result Date: 10/14/2017 CLINICAL DATA:  Altered mental status EXAM: PORTABLE CHEST 1 VIEW COMPARISON:  06/27/2017 FINDINGS: Cardiac shadow is within normal limits. Mild aortic calcifications are seen. Lungs are well aerated bilaterally. No focal infiltrate or sizable effusion is seen. Postsurgical changes are noted in the proximal left humerus. IMPRESSION: No acute abnormality noted. Electronically Signed   By: Inez Catalina M.D.   On: 10/14/2017 18:25    Procedures .Critical Care Performed by: Duffy Bruce Authorized by: Duffy Bruce   Critical care provider statement:    Critical care time (minutes):  45   Critical care time was exclusive of:  Separately billable procedures and treating other patients and teaching time   Critical care was necessary to treat or prevent imminent or life-threatening deterioration of the following conditions:  Circulatory failure, cardiac failure, sepsis, toxidrome, metabolic crisis and dehydration   Critical care was time spent personally by me on the following activities:  Development of treatment plan with patient or surrogate, discussions with consultants, evaluation of patient's response to treatment, examination of patient, obtaining history from patient or surrogate, ordering and performing treatments and interventions, ordering and review of laboratory studies, ordering and review of radiographic studies,  pulse oximetry, re-evaluation of patient's condition and review of old charts   I assumed direction of critical care for this patient from another provider in my specialty: no     (including critical care time)  Medications Ordered in ED Medications  vancomycin (VANCOCIN) 1,250 mg in sodium chloride 0.9 % 250 mL IVPB (not administered)  vancomycin (VANCOCIN) IVPB 1000 mg/200 mL premix (1,000 mg Intravenous Not Given 10/14/17 2004)  piperacillin-tazobactam (ZOSYN) IVPB 3.375 g (3.375 g Intravenous New Bag/Given 10/15/17 0210)  sodium bicarbonate 150 mEq in dextrose 5 % 1,000 mL infusion ( Intravenous New Bag/Given 67/34/19 3790)  folic acid injection 1 mg (not administered)  thiamine (B-1) injection 100 mg (not administered)  0.9 %  sodium chloride infusion (250 mLs Intravenous New Bag/Given 10/14/17 2359)  pantoprazole (PROTONIX) injection 40 mg (not administered)  lactulose (CHRONULAC) enema 200 gm (not administered)  diltiazem (CARDIZEM) 100 mg in dextrose 5 % 100 mL (1 mg/mL) infusion (10 mg/hr Intravenous Rate/Dose Change 10/15/17 0056)  LORazepam (ATIVAN) injection 1 mg (1 mg Intravenous Given 10/14/17 1802)  sodium chloride 0.9 % 1,000 mL with thiamine 240 mg, folic acid 1 mg, multivitamins adult 10 mL infusion ( Intravenous Stopped 10/14/17 2101)  lactated ringers bolus 1,000 mL (0 mLs Intravenous Stopped 10/14/17 1931)  lactated ringers bolus 1,000 mL (0 mLs Intravenous Stopped 10/14/17 2003)  piperacillin-tazobactam (ZOSYN) IVPB  3.375 g (0 g Intravenous Stopped 10/14/17 1908)  vancomycin (VANCOCIN) IVPB 1000 mg/200 mL premix (0 mg Intravenous Stopped 10/14/17 1922)  sodium bicarbonate injection 50 mEq (50 mEq Intravenous Given 10/14/17 1930)  LORazepam (ATIVAN) injection 1 mg (1 mg Intravenous Given 10/14/17 1928)  lactulose (CHRONULAC) enema 200 gm (300 mLs Rectal Given 10/15/17 0054)  magnesium sulfate IVPB 2 g 50 mL (0 g Intravenous Stopped 10/15/17 0052)  lactated ringers  bolus 1,000 mL (0 mLs Intravenous Stopped 10/15/17 0038)     Initial Impression / Assessment and Plan / ED Course  I have reviewed the triage vital signs and the nursing notes.  Pertinent labs & imaging results that were available during my care of the patient were reviewed by me and considered in my medical decision making (see chart for details).     56 year old male with history of alcoholic cirrhosis here with hypoglycemia, altered mental status. On arrival, the patient is tachycardic, hypertensive, and hypothermic. Will activate code sepsis given his abnormal vital signs and undifferentiated nature of his encephalopathy. Otherwise, differential is broad. Given his intermittent shaking and episode of unresponsiveness and no gradual return, I am concerned for possible seizure. He has a history of alcohol abuse and he may be going through alcohol withdrawal. Regarding his hypoglycemia, he is not on insulin. This may have been secondary to his seizure. Will place him on regular CBG monitoring. Otherwise, broad labs sent, fluids including thiamine and folate given.  Lab work is consistent with profound metabolic acidosis with high anion gap. He has a profound lactic acidosis as well as moderately elevated ethanol level. I suspect this is secondary to seizure given that he is not hypoperfusing on exam, but must consider possible toxic alcohol in the setting of markedly decreased bicarbonate.Patient on warmer, given fluids, antibiotics, and CT head is negative. I have added on a serum osmolality to assess for possible toxic alcohol ingestion.   History, labs, and clinical course is c/w decompensated alcoholic cirrhosis with possible seizure. Given his metabolic derangements, will need admission to ICU. Pt remains tachycardic but BP improving. EKG shows supraventricular tachycardia - difficult to assess whether this is sinus tachycardia, EAT/SVT, or possibly AFlutter as it is highly regular in rate 140s.  At this time, will continue fluids, resuscitation. If it remains elevated/unresponsive to these interventions, may consider dilt. His CHADSVASC is 1.  Final Clinical Impressions(s) / ED Diagnoses   Final diagnoses:  Acute encephalopathy  Hypoglycemia  AKI (acute kidney injury) ([place])  Metabolic acidosis  Alcoholic cirrhosis of liver without ascites [facility])    New Prescriptions Current Discharge Medication List       Duffy Bruce, MD 10/15/17 [phone]

## 2017-10-14 NOTE — Progress Notes (Signed)
Magnesium 1.0. Repeat LA 19.5.   2 grams Mag and 1L LR ordered.   Hayden Pedro, AGACNP-BC [facility]  Pgr: [phone]  PCCM Pgr: [phone]

## 2017-10-14 NOTE — H&P (Signed)
PULMONARY / CRITICAL CARE MEDICINE   Name: William [name] MRN: [id number] DOB: 26-May-1961    ADMISSION DATE:  10/14/2017 CONSULTATION DATE:  10/14/2017  REFERRING MD:  Dr. Ellender Hose   CHIEF COMPLAINT:  Encephalopathy   HISTORY OF PRESENT ILLNESS:   56 year old male with PMH of Anemia, CHF, alcoholic cirrhosis with active ETOH abuse, esophageal varices, HTN, CKD stage 3  Presents to ED for AMS and hypoglycemia. Family reports that patient lives alone and went to daughters house for house warming. Patient arrived to the house with his father. Patient was able to ambulate to the car and into the house. Daughter states she had a conversation with the patient when he first arrived. She noticed he looked pale and had peeling skin on his hands. She asked him if he had been drinking ETOH and not eating. He responded that he was not eating and drinking fluids throughout the week. She checked her bank statement and saw he visited the local liquor store multiple times. Toward the end of the event at the daughters house the patient was sitting outside and refused to move. He was confused and not following commands.  At the event patient seemed confused and sleepy with progressive weakness. EMS was called. EMS reports glucose was 19. HR 140, BP 95/62. Upon arrival to ED LA 17, ABG 7.030, 19.7, 55. ETOH 166. PCCM asked to admit   Patient was given 3-4 L IVF in the ER and started on bicarb drip for severe acidosis. On arrival to the ER patient was confused and not following commands. Foley was placed. Patient was also given 2 mg of lorazepam in the ED. On my arrival patient was lethargic but easily arousable with verbal stimuli and able to follow simple commands. Patient was able to deny having chest pain, shortness of breath and abdominal pain.   Patient was recently discharged from [facility] for anemia and humoral fracture s/p fall and has been doing well. Patient sisters were at bedside and deny patient having  any falls today.   PAST MEDICAL HISTORY :  He  has a past medical history of Anemia; CHF (congestive heart failure) ([place]); Chronic kidney disease; Cirrhosis of liver ([place]); COMPRESSION FRACTURE, LUMBAR VERTEBRAE (07/29/2010); ETOH abuse; Hypertension; and Prostate cancer ([place]).  PAST SURGICAL HISTORY: He  has a past surgical history that includes Achilles tendon repair; Esophagogastroduodenoscopy (egd) with propofol (N/A, 09/13/2016); and ORIF humerus fracture (Right, 06/30/2017).  Allergies  Allergen Reactions  . No Known Allergies     No current facility-administered medications on file prior to encounter.    Current Outpatient Prescriptions on File Prior to Encounter  Medication Sig  . allopurinol (ZYLOPRIM) 100 MG tablet Take 1 tablet (100 mg total) by mouth daily.  . Ferrous Sulfate (IRON) 325 (65 Fe) MG TABS TAKE 1 TABLET BY MOUTH ONCE DAILY  . folic acid (FOLVITE) 1 MG tablet Take 1 tablet (1 mg total) by mouth daily.  Marland Kitchen gabapentin (NEURONTIN) 100 MG capsule Take 1 capsule (100 mg total) by mouth 3 (three) times daily.  Marland Kitchen ibuprofen (ADVIL,MOTRIN) 600 MG tablet Take 1 tablet (600 mg total) by mouth every 6 (six) hours as needed for mild pain or moderate pain.  . magnesium oxide (MAG-OX) 400 (241.3 Mg) MG tablet Take 2 tablets (800 mg total) by mouth 2 (two) times daily.  Marland Kitchen omeprazole (PRILOSEC) 40 MG capsule Take 1 capsule (40 mg total) by mouth daily.  Marland Kitchen oxyCODONE (OXY IR/ROXICODONE) 5 MG immediate release tablet  Take 1 tablet (5 mg total) by mouth every 8 (eight) hours as needed for breakthrough pain.  Marland Kitchen propranolol (INDERAL) 40 MG tablet TAKE ONE TABLET BY MOUTH TWICE DAILY (Patient taking differently: TAKE 40 mg TABLET BY MOUTH TWICE DAILY)  . thiamine (VITAMIN B-1) 100 MG tablet Take 100 mg by mouth daily.  . furosemide (LASIX) 20 MG tablet Take 1 tablet (20 mg total) by mouth daily. (Patient not taking: Reported on 10/14/2017)  . magnesium oxide (MAG-OX) 400 (241.3 Mg) MG tablet  TAKE 2 TABLETS BY MOUTH TWICE DAILY (Patient not taking: Reported on 10/14/2017)  . Magnesium Oxide 400 (240 Mg) MG TABS TAKE 2 TABLETS BY MOUTH TWICE DAILY (Patient not taking: Reported on 10/14/2017)    FAMILY HISTORY:  His indicated that the status of his mother is unknown. He indicated that the status of his father is unknown.    SOCIAL HISTORY: He  reports that he has never smoked. He has never used smokeless tobacco. He reports that he drinks alcohol. He reports that he does not use drugs.  REVIEW OF SYSTEMS:   Unable to review as patient is encephalopathic    VITAL SIGNS: BP (!) 89/56   Pulse (!) 142   Temp (!) 95.7 F (35.4 C)   Resp (!) 21   Wt 89.8 kg (198 lb)   SpO2 99%   BMI 27.62 kg/m    INTAKE / OUTPUT: No intake/output data recorded.  PHYSICAL EXAMINATION: General:  African [place] male appropriate for age lying in bed NAD.   Neuro:  Lethargic, awakens to verbal stimuli. AAOx3 (able to tell me he was at [employer], year, name, date of birth)  HEENT:  Dry MM  Cardiovascular:  Sinus tachycardia HR 120's  Lungs:  Clear breath sounds b/l, non-labored no W/R/R Abdomen:  Non-distended, active bowel sounds no fluid wave minimal tenderness on deep palpation Musculoskeletal:   5/5 muscle strength in all 4 extremities. Visible well healed surgical scar in left upper ext. No tenderness to palpation. +1 edema of lower ext b/l Skin:  Warm, dry, intact  +foley with yellow urine  LABS:  BMET  Recent Labs Lab 10/14/17 1800  NA 130*  K 3.6  CL 91*  CO2 <7*  BUN 15  CREATININE 2.33*  GLUCOSE 78    Electrolytes  Recent Labs Lab 10/14/17 1800  CALCIUM 8.0*    CBC  Recent Labs Lab 10/14/17 1800  WBC 6.3  HGB 10.1*  HCT 29.6*  PLT 48*    Coag's No results for input(s): APTT, INR in the last 168 hours.  Sepsis Markers  Recent Labs Lab 10/14/17 1813  LATICACIDVEN >17.00*    ABG  Recent Labs Lab 10/14/17 2010  PHART 7.189*  PCO2ART  <15.0*  PO2ART 148.0*    Liver Enzymes  Recent Labs Lab 10/14/17 1800  AST 291*  ALT 82*  ALKPHOS 175*  BILITOT 6.3*  ALBUMIN 2.9*    Cardiac Enzymes No results for input(s): TROPONINI, PROBNP in the last 168 hours.  Glucose  Recent Labs Lab 10/14/17 1725 10/14/17 1749 10/14/17 1845 10/14/17 2035  GLUCAP 89 80 70 81    Imaging Ct Head Wo Contrast  Result Date: 10/14/2017 CLINICAL DATA:  Altered level of consciousness, hypertension and history prostate cancer. EXAM: CT HEAD WITHOUT CONTRAST TECHNIQUE: Contiguous axial images were obtained from the base of the skull through the vertex without intravenous contrast. COMPARISON:  05/18/2010 FINDINGS: Brain: Superficial and central atrophy with chronic appearing small vessel ischemic disease.  No large vascular territory infarct, hemorrhage or midline shift. No intra-axial mass nor extra-axial fluid collections. Vascular: Mild-to-moderate atherosclerosis of the carotid siphons. Skull: No acute skull fracture or suspicious osseous lesions. Sinuses/Orbits: Circumferential mild-to-moderate mucosal thickening of the maxillary and sphenoid sinuses consistent chronic paranasal sinusitis. Clear mastoids. Motion artifacts limit assessment at the skullbase. Other: None IMPRESSION: 1. Atrophy with chronic appearing small vessel ischemic disease. 2. No acute intracranial abnormality. 3. Chronic paranasal sinusitis predominantly involving the sphenoid and maxillary sinuses. Electronically Signed   By: Ashley Royalty M.D.   On: 10/14/2017 19:04   Dg Chest Portable 1 View  Result Date: 10/14/2017 CLINICAL DATA:  Altered mental status EXAM: PORTABLE CHEST 1 VIEW COMPARISON:  06/27/2017 FINDINGS: Cardiac shadow is within normal limits. Mild aortic calcifications are seen. Lungs are well aerated bilaterally. No focal infiltrate or sizable effusion is seen. Postsurgical changes are noted in the proximal left humerus. IMPRESSION: No acute abnormality  noted. Electronically Signed   By: Inez Catalina M.D.   On: 10/14/2017 18:25     STUDIES:  CXR 10/20 > No acute abnormality noted CT Head 10/20 > 1. Atrophy with chronic appearing small vessel ischemic disease. No acute intracranial abnormality. Chronic paranasal sinusitis predominantly involving the sphenoid and maxillary sinuses. ECHO 10/20 >> [place] ABD 10/20 >>   CULTURES: Blood 10/20 >> Urine 10/20 >>   ANTIBIOTICS: Zosyn 10/20 >> Vancomycin 10/20 >>   SIGNIFICANT EVENTS: 10/20 > Presents to ED   LINES/TUBES: PIV   DISCUSSION: 56 year old male with unknown alcoholic cirrhosis, presents lethargic with ETOH 116, glucose 15, ammonia 113, LA > 17.   ASSESSMENT / PLAN:  PULMONARY A: At risk for respiratory insufficieny  P:   Monitor > Currently protecting airway  Pulmonary Hygiene  Maintain Oxygen Saturation >92  Repeat ABG at 0000   CARDIOVASCULAR A:  Sinus Tachycardia in setting of severe dehydration and metabolic acidosis   H/O HTN, CHF   P:  Cardiac Monitoring ECHO pending. Patient was able to tolerate large volume resuscitation without signs of heart failure.   Hold home lasix, propranolol   RENAL A:   Severe Anion Gap Metabolic Acidosis with Osmolar Gap of 17 Acute on Chronic Renal Failure (Crt base 1.2-1.5)  Hypovolemic Hyponatremia  Secondary to Poor Po intake and ETOH abuse P:   Trend BMP q4 hours Replace Electrolytes as indicated  Bicarb @ 100 ml/hr  F/u urine sodium -maintain foley and monitor I/O -unclear at this time what is causing osmolar gap. Will recheck. No history of methanol, ethylene glycol, isopropyl alcohol consumption.   GASTROINTESTINAL A:   Alcoholic Cirrhosis with active ETOH abuse Transaminase  H/O Esophageal varices AST/ALT 291/82  Ammonia 113  P:   NPO PPI  Lactulose Enema q8h > Trend Levels. In Am if patient is more awake will try to transition to oral lactulose and start diet. Patient does not take lactulose as  outpatient.  Trend LFT  U/S ABD pending  On propanolol for portal hypertension. Will hold for now.   HEMATOLOGIC A:   Anemia of Chronic Disease  Chronic Thrombocytopenia secondary to alcoholic cirrhosis PLTs 48, Baseline 40-60's  P:  Trend CBC  SCDs only  platelets stable. No clinical evidence of bleeding.   INFECTIOUS A:   Altered mental status r/o sepsis P:   Even though patient has elevated lactic acid and tachycardia and hypothermia no clear source or clinical evidence of infection at this time Trend WBC and Fever Curve  Follow Culture Data  Trend PCT and LA  Continue Vancomycin and Zosyn for 24-48 hours  -low clinical suspicion for SBP  ENDOCRINE A:   Hypoglycemia  Secondary to poor PO intake P:   Trend Glucose  F/u Hg A1c and TSH  NEUROLOGIC A:  Altered mental status secondary to hypoglycemia, severe anion gap metabolic acidosis, hepatic encephalopathy  ETOH 166.  Acetaminophen <20, Salicylate <7 Ammonia 233  Currently AAox3 but lethargic P:   Monitor with q6 hour neuro checks Folic Acid/Thiamine  Hold home Neurontin Hold any further benzo and sedatives for now. Will need to be closely monitored for signs of withdrawal with CIWA protocol.   Start lactulose enema for elevated ammonia and repeat level in AM -q4 hour BMP and glucose -no clinical signs of infectious process at this time -CT head reviewed.   FAMILY  - Updates: Family updated at bedside  -I have discussed the case with the patients daughter who is at bedside in the ICU. She is POA and able to provide adequate history. Questions answered.Daugther made aware of severe metabolic acidosis and other lab abnormalities.    CC Time: 45 minutes   Hayden Pedro, AGACNP-BC [facility]  Pgr: [phone]  PCCM Pgr: [place] [facility] Pager: [phone]

## 2017-10-14 NOTE — ED Notes (Signed)
Patient transported to Ultrasound 

## 2017-10-14 NOTE — ED Notes (Signed)
icu provider at bedside and wants me to wait before drawing blood.  Provider may add addl blood orders.

## 2017-10-14 NOTE — ED Notes (Signed)
Admitting MD at bedside evaluating pt.

## 2017-10-14 NOTE — Progress Notes (Signed)
Pharmacy Antibiotic Note  William [name] is a 56 y.o. male admitted on 10/14/2017 with AMS and hypoglycemia. LA > 17, hypothermic. Unknown source, starting empiric abx, SCr 1.1 > 2.3, eCrCl 30-35 ml/min.   Plan: -Vancomycin 1 more g to equal a 2 g load then 1250 mg IV q24h -Zosyn 3.375 g IV q8h -Monitor renal fx, cultures, VT at [facility]   Weight: 198 lb (89.8 kg)  Temp (24hrs), Avg:94.7 F (34.8 C), Min:94.7 F (34.8 C), Max:94.7 F (34.8 C)   Recent Labs Lab 10/14/17 1800 10/14/17 1813  WBC 6.3  --   CREATININE 2.33*  --   LATICACIDVEN  --  >17.00*    Estimated Creatinine Clearance: 36.8 mL/min (A) (by C-G formula based on SCr of 2.33 mg/dL (H)).    Allergies  Allergen Reactions  . No Known Allergies     Antimicrobials this admission: 10/20 vancomycin > 10/20 zosyn >  Dose adjustments this admission: N/A  Microbiology results: 10/20 urine cx: 10/20 blood cx:    William [name] 10/14/2017 7:27 PM

## 2017-10-14 NOTE — ED Notes (Signed)
Patient currently at ultrasound .  

## 2017-10-14 NOTE — Progress Notes (Signed)
[facility] Progress Note Patient Name: William [name] DOB: 04-11-1961 MRN: [id number]   Date of Service  10/14/2017  HPI/Events of Note  Afib with RVR.  In setting of severe metabolic acidosis.  Presently with systolic bp greater than 737.  Not on vasopressors.  Unlikely to convert in present metabolic state.  eICU Interventions  Attempt to control rate with cardizem drip if bp allows     Intervention Category Intermediate Interventions: Arrhythmia - evaluation and management  Mauri [place], P 10/14/2017, 11:21 PM

## 2017-10-14 NOTE — ED Triage Notes (Signed)
LSN 1300.  Pt here via [facility] for altered mental status and hypoglycemia.  Normally pt ao x 4 and ambulates with a cane.  When they arrived at a function pt "Needed more assistance than normal walking, and that he stopped talking".  Initial cbg was 19.  Given 25 G D10.  Pt able to follow some commands, but not all.  Family states hx of same with DT's in past.  HR 140, 95/62, O2 sats 100%, rr 24.  Given 750 NS en-route.  20 G L hand.

## 2017-10-15 ENCOUNTER — Inpatient Hospital Stay (HOSPITAL_COMMUNITY): Payer: Medicare PPO

## 2017-10-15 DIAGNOSIS — E872 Acidosis: Secondary | ICD-10-CM

## 2017-10-15 DIAGNOSIS — R0902 Hypoxemia: Secondary | ICD-10-CM

## 2017-10-15 DIAGNOSIS — G9340 Encephalopathy, unspecified: Secondary | ICD-10-CM

## 2017-10-15 DIAGNOSIS — I509 Heart failure, unspecified: Secondary | ICD-10-CM

## 2017-10-15 DIAGNOSIS — R579 Shock, unspecified: Secondary | ICD-10-CM

## 2017-10-15 LAB — CBC
HEMATOCRIT: 26.6 % — AB (ref 39.0–52.0)
HEMOGLOBIN: 8.8 g/dL — AB (ref 13.0–17.0)
MCH: 32.8 pg (ref 26.0–34.0)
MCHC: 33.1 g/dL (ref 30.0–36.0)
MCV: 99.3 fL (ref 78.0–100.0)
Platelets: 35 10*3/uL — ABNORMAL LOW (ref 150–400)
RBC: 2.68 MIL/uL — AB (ref 4.22–5.81)
RDW: 15.8 % — ABNORMAL HIGH (ref 11.5–15.5)
WBC: 3.1 10*3/uL — ABNORMAL LOW (ref 4.0–10.5)

## 2017-10-15 LAB — HEPATIC FUNCTION PANEL
ALBUMIN: 2.5 g/dL — AB (ref 3.5–5.0)
ALT: 94 U/L — AB (ref 17–63)
AST: 403 U/L — AB (ref 15–41)
Alkaline Phosphatase: 143 U/L — ABNORMAL HIGH (ref 38–126)
BILIRUBIN TOTAL: 6 mg/dL — AB (ref 0.3–1.2)
Bilirubin, Direct: 3.5 mg/dL — ABNORMAL HIGH (ref 0.1–0.5)
Indirect Bilirubin: 2.5 mg/dL — ABNORMAL HIGH (ref 0.3–0.9)
Total Protein: 7.1 g/dL (ref 6.5–8.1)

## 2017-10-15 LAB — BLOOD GAS, ARTERIAL
Acid-base deficit: 2.4 mmol/L — ABNORMAL HIGH (ref 0.0–2.0)
Acid-base deficit: 20.5 mmol/L — ABNORMAL HIGH (ref 0.0–2.0)
BICARBONATE: 6.2 mmol/L — AB (ref 20.0–28.0)
Bicarbonate: 20.5 mmol/L (ref 20.0–28.0)
Drawn by: 418751
Drawn by: 441371
FIO2: 21
FIO2: 21
O2 SAT: 91.6 %
O2 SAT: 94.4 %
PATIENT TEMPERATURE: 100.2
PATIENT TEMPERATURE: 96.1
PO2 ART: 63.8 mmHg — AB (ref 83.0–108.0)
PO2 ART: 95.6 mmHg (ref 83.0–108.0)
pCO2 arterial: 28.2 mmHg — ABNORMAL LOW (ref 32.0–48.0)
pH, Arterial: 7.232 — ABNORMAL LOW (ref 7.350–7.450)
pH, Arterial: 7.478 — ABNORMAL HIGH (ref 7.350–7.450)

## 2017-10-15 LAB — PROCALCITONIN: Procalcitonin: 1 ng/mL

## 2017-10-15 LAB — COMPREHENSIVE METABOLIC PANEL
ALBUMIN: 2.1 g/dL — AB (ref 3.5–5.0)
ALT: 91 U/L — ABNORMAL HIGH (ref 17–63)
ANION GAP: 12 (ref 5–15)
AST: 413 U/L — ABNORMAL HIGH (ref 15–41)
Alkaline Phosphatase: 117 U/L (ref 38–126)
BILIRUBIN TOTAL: 5.3 mg/dL — AB (ref 0.3–1.2)
BUN: 17 mg/dL (ref 6–20)
CALCIUM: 7.2 mg/dL — AB (ref 8.9–10.3)
CO2: 25 mmol/L (ref 22–32)
Chloride: 93 mmol/L — ABNORMAL LOW (ref 101–111)
Creatinine, Ser: 2.74 mg/dL — ABNORMAL HIGH (ref 0.61–1.24)
GFR calc Af Amer: 28 mL/min — ABNORMAL LOW (ref >60)
GFR calc non Af Amer: 24 mL/min — ABNORMAL LOW (ref >60)
GLUCOSE: 190 mg/dL — AB (ref 65–99)
POTASSIUM: 3.5 mmol/L (ref 3.5–5.1)
SODIUM: 130 mmol/L — AB (ref 135–145)
TOTAL PROTEIN: 6.1 g/dL — AB (ref 6.5–8.1)

## 2017-10-15 LAB — ECHOCARDIOGRAM COMPLETE
Height: 71 in
WEIGHTICAEL: 3086.44 [oz_av]

## 2017-10-15 LAB — GLUCOSE, CAPILLARY
GLUCOSE-CAPILLARY: 150 mg/dL — AB (ref 65–99)
GLUCOSE-CAPILLARY: 192 mg/dL — AB (ref 65–99)
GLUCOSE-CAPILLARY: 84 mg/dL (ref 65–99)
Glucose-Capillary: 169 mg/dL — ABNORMAL HIGH (ref 65–99)
Glucose-Capillary: 180 mg/dL — ABNORMAL HIGH (ref 65–99)
Glucose-Capillary: 96 mg/dL (ref 65–99)

## 2017-10-15 LAB — BASIC METABOLIC PANEL
BUN: 14 mg/dL (ref 6–20)
CALCIUM: 7.9 mg/dL — AB (ref 8.9–10.3)
CHLORIDE: 94 mmol/L — AB (ref 101–111)
Creatinine, Ser: 2.34 mg/dL — ABNORMAL HIGH (ref 0.61–1.24)
GFR calc non Af Amer: 29 mL/min — ABNORMAL LOW (ref >60)
GFR, EST AFRICAN AMERICAN: 34 mL/min — AB (ref >60)
Glucose, Bld: 74 mg/dL (ref 65–99)
POTASSIUM: 3.8 mmol/L (ref 3.5–5.1)
Sodium: 134 mmol/L — ABNORMAL LOW (ref 135–145)

## 2017-10-15 LAB — MRSA PCR SCREENING: MRSA by PCR: NEGATIVE

## 2017-10-15 LAB — TROPONIN I: Troponin I: 0.03 ng/mL (ref <0.03)

## 2017-10-15 LAB — LACTIC ACID, PLASMA: Lactic Acid, Venous: 18.3 mmol/L (ref 0.5–1.9)

## 2017-10-15 LAB — PHOSPHORUS: Phosphorus: 5.2 mg/dL — ABNORMAL HIGH (ref 2.5–4.6)

## 2017-10-15 LAB — HEMOGLOBIN A1C
Hgb A1c MFr Bld: 5 % (ref 4.8–5.6)
Mean Plasma Glucose: 96.8 mg/dL

## 2017-10-15 LAB — BRAIN NATRIURETIC PEPTIDE: B Natriuretic Peptide: 182.3 pg/mL — ABNORMAL HIGH (ref 0.0–100.0)

## 2017-10-15 LAB — AMMONIA: Ammonia: 94 umol/L — ABNORMAL HIGH (ref 9–35)

## 2017-10-15 LAB — MAGNESIUM: MAGNESIUM: 0.9 mg/dL — AB (ref 1.7–2.4)

## 2017-10-15 LAB — TSH: TSH: 2.611 u[IU]/mL (ref 0.350–4.500)

## 2017-10-15 MED ORDER — DEXMEDETOMIDINE HCL IN NACL 200 MCG/50ML IV SOLN
0.2000 ug/kg/h | INTRAVENOUS | Status: DC
  Administered 2017-10-15 – 2017-10-16 (×2): 0.2 ug/kg/h via INTRAVENOUS
  Filled 2017-10-15 (×2): qty 50

## 2017-10-15 MED ORDER — MAGNESIUM SULFATE 2 GM/50ML IV SOLN
2.0000 g | Freq: Once | INTRAVENOUS | Status: AC
  Administered 2017-10-15: 2 g via INTRAVENOUS
  Filled 2017-10-15: qty 50

## 2017-10-15 MED ORDER — INSULIN ASPART 100 UNIT/ML ~~LOC~~ SOLN: 0.0000 [IU] | Freq: Three times a day (TID) | SUBCUTANEOUS | Status: DC

## 2017-10-15 MED ORDER — INSULIN ASPART 100 UNIT/ML ~~LOC~~ SOLN
2.0000 [IU] | SUBCUTANEOUS | Status: DC
  Administered 2017-10-15: 4 [IU] via SUBCUTANEOUS

## 2017-10-15 MED ORDER — LACTULOSE 10 GM/15ML PO SOLN
20.0000 g | Freq: Three times a day (TID) | ORAL | Status: DC
  Administered 2017-10-15 – 2017-10-16 (×4): 20 g via ORAL
  Filled 2017-10-15 (×5): qty 30

## 2017-10-15 MED ORDER — INSULIN ASPART 100 UNIT/ML ~~LOC~~ SOLN
0.0000 [IU] | SUBCUTANEOUS | Status: DC
  Administered 2017-10-16 (×3): 1 [IU] via SUBCUTANEOUS
  Administered 2017-10-16: 2 [IU] via SUBCUTANEOUS

## 2017-10-15 MED ORDER — PHENYLEPHRINE HCL 10 MG/ML IJ SOLN
0.0000 ug/min | INTRAMUSCULAR | Status: DC
  Administered 2017-10-15: 20 ug/min via INTRAVENOUS
  Administered 2017-10-16: 100 ug/min via INTRAVENOUS
  Administered 2017-10-16: 50 ug/min via INTRAVENOUS
  Administered 2017-10-16 (×4): 100 ug/min via INTRAVENOUS
  Filled 2017-10-15 (×11): qty 1

## 2017-10-15 NOTE — Progress Notes (Signed)
PULMONARY / CRITICAL CARE MEDICINE   Name: William [name] MRN: [id number] DOB: [birth year]    ADMISSION DATE:  10/14/2017 CONSULTATION DATE:  10/14/2017  REFERRING MD:  Dr. Ellender Hose   CHIEF COMPLAINT:  Encephalopathy   HISTORY OF PRESENT ILLNESS:   56 year old male with PMH of Anemia, CHF, alcoholic cirrhosis with active ETOH abuse, esophageal varices, HTN, CKD stage 3.    Presents to ED for AMS and hypoglycemia. Family reports that patient lives alone and went to daughters house for house warming. Patient arrived to the house with his father. Patient was able to ambulate to the car and into the house. Daughter states she had a conversation with the patient when he first arrived. She noticed he looked pale and had peeling skin on his hands. She asked him if he had been drinking ETOH and not eating. He responded that he was not eating and drinking fluids throughout the week. She checked her bank statement and saw he visited the local liquor store multiple times. Toward the end of the event at the daughters house the patient was sitting outside and refused to move. He was confused and not following commands.  At the event patient seemed confused and sleepy with progressive weakness. EMS was called. EMS reports glucose was 19. HR 140, BP 95/62. Upon arrival to ED LA 17, ABG 7.030, 19.7, 55. ETOH 166. PCCM asked to admit   Patient was given 3-4 L IVF in the ER and started on bicarb drip for severe acidosis. On arrival to the ER patient was confused and not following commands. Foley was placed. Patient was also given 2 mg of lorazepam in the ED. On my arrival patient was lethargic but easily arousable with verbal stimuli and able to follow simple commands. Patient was able to deny having chest pain, shortness of breath and abdominal pain.   Patient was recently discharged from [facility] for anemia and humoral fracture s/p fall and has been doing well. Patient sisters were at bedside and deny  patient having any falls today.   SUBJECTIVE:  No events overnight, hungry  VITAL SIGNS: BP 100/60   Pulse (!) 103   Temp 99.9 F (37.7 C)   Resp (!) 22   Ht 5\' 11"  (1.803 m)   Wt 87.5 kg (192 lb 14.4 oz)   SpO2 99%   BMI 26.90 kg/m   INTAKE / OUTPUT: I/O last 3 completed shifts: In: 6484.4 [I.V.:3134.4; IV QBHALPFXT:0240] Out: 225 [Urine:225]  PHYSICAL EXAMINATION: General:  Chronically ill appearing male with ascites Neuro:  Alert and oriented, moving all ext to command HEENT:  [place]/AT, PERRL, EOM-I and MMM Cardiovascular:  RRR, Nl S1/S2, -M/R/G. Lungs:  CTA bilaterally Abdomen:  Distended with a wave, soft, diffusely tender and +BS Musculoskeletal:  -edema and -tenderness Skin:  Intact  LABS:  BMET  Recent Labs Lab 10/14/17 1800 10/15/17 0018  NA 130* 134*  K 3.6 3.8  CL 91* 94*  CO2 <7* <7*  BUN 15 14  CREATININE 2.33* 2.34*  GLUCOSE 78 74   Electrolytes  Recent Labs Lab 10/14/17 1800 10/14/17 2124 10/15/17 0018  CALCIUM 8.0*  --  7.9*  MG  --  1.0* 0.9*  PHOS  --   --  5.2*   CBC  Recent Labs Lab 10/14/17 1800 10/15/17 0018  WBC 6.3 3.1*  HGB 10.1* 8.8*  HCT 29.6* 26.6*  PLT 48* 35*   Coag's  Recent Labs Lab 10/14/17 2124  INR 1.72  Sepsis Markers  Recent Labs Lab 10/14/17 1813 10/14/17 2124 10/15/17 0018  LATICACIDVEN >17.00* 19.5* 18.3*  PROCALCITON  --  0.85 1.00   ABG  Recent Labs Lab 10/14/17 2010 10/15/17 0020  PHART 7.189* 7.232*  PCO2ART <15.0* VALUE BELOW RANGE  PO2ART 148.0* 95.6   Liver Enzymes  Recent Labs Lab 10/14/17 1800 10/15/17 0018  AST 291* 403*  ALT 82* 94*  ALKPHOS 175* 143*  BILITOT 6.3* 6.0*  ALBUMIN 2.9* 2.5*   Cardiac Enzymes  Recent Labs Lab 10/15/17 0018  TROPONINI 0.03*   Glucose  Recent Labs Lab 10/14/17 1725 10/14/17 1749 10/14/17 1845 10/14/17 2035 10/14/17 2314 10/15/17 0033  GLUCAP 89 80 70 81 73 84   Imaging Ct Head Wo Contrast  Result Date:  10/14/2017 CLINICAL DATA:  Altered level of consciousness, hypertension and history prostate cancer. EXAM: CT HEAD WITHOUT CONTRAST TECHNIQUE: Contiguous axial images were obtained from the base of the skull through the vertex without intravenous contrast. COMPARISON:  05/18/2010 FINDINGS: Brain: Superficial and central atrophy with chronic appearing small vessel ischemic disease. No large vascular territory infarct, hemorrhage or midline shift. No intra-axial mass nor extra-axial fluid collections. Vascular: Mild-to-moderate atherosclerosis of the carotid siphons. Skull: No acute skull fracture or suspicious osseous lesions. Sinuses/Orbits: Circumferential mild-to-moderate mucosal thickening of the maxillary and sphenoid sinuses consistent chronic paranasal sinusitis. Clear mastoids. Motion artifacts limit assessment at the skullbase. Other: None IMPRESSION: 1. Atrophy with chronic appearing small vessel ischemic disease. 2. No acute intracranial abnormality. 3. Chronic paranasal sinusitis predominantly involving the sphenoid and maxillary sinuses. Electronically Signed   By: Ashley Royalty M.D.   On: 10/14/2017 19:04   US Abdomen Complete  Result Date: 10/14/2017 CLINICAL DATA:  Right upper quadrant pain. History of splenomegaly, hypertension, prostate cancer, hepatic cirrhosis, cholelithiasis. EXAM: ABDOMEN ULTRASOUND COMPLETE COMPARISON:  CT abdomen and pelvis 06/27/2017 FINDINGS: Gallbladder: Gallbladder is distended. Multiple small stones in the gallbladder layering along the dependent surface. No gallbladder wall thickening or edema. Murphy's sign is negative. Common bile duct: Diameter: 6.7 mm, normal Liver: Changes of hepatic cirrhosis with diffuse heterogeneous parenchymal echotexture and nodular contour. Free fluid is demonstrated around the liver edge consistent with ascites. No focal lesions are identified but poor penetration limits evaluation. Color flow Doppler imaging of the main portal vein  shows no flow in the mid and distal portal vein with segmental flow demonstrated in the proximal portal vein demonstrated flow reversal. IVC: No abnormality visualized. Pancreas: Visualized portion unremarkable. Spleen: Spleen is enlarged with spleen length measuring 13.5 cm. Calculated volume is 774 mL. Right Kidney: Length: 11.1 cm. Echogenicity within normal limits. No mass or hydronephrosis visualized. Left Kidney: Length: 11.9 cm. Visualization is limited due to poor penetration. Echogenicity appears within normal limits. No mass or hydronephrosis visualized. Abdominal aorta: No aneurysm visualized. Other findings: Examination is somewhat technically limited due to patient's limited mobility, body habitus, and bowel gas. IMPRESSION: 1. Cholelithiasis with distended gallbladder. No additional changes to suggest cholecystitis. 2. Changes of hepatic cirrhosis with portal venous hypertension. 3. Partial portal vein thrombosis with reversal of flow in the proximal portal vein. 4. Splenic enlargement. Electronically Signed   By: Lucienne Capers M.D.   On: 10/14/2017 22:28   Dg Chest Portable 1 View  Result Date: 10/14/2017 CLINICAL DATA:  Altered mental status EXAM: PORTABLE CHEST 1 VIEW COMPARISON:  06/27/2017 FINDINGS: Cardiac shadow is within normal limits. Mild aortic calcifications are seen. Lungs are well aerated bilaterally. No focal infiltrate or sizable effusion is seen.  Postsurgical changes are noted in the proximal left humerus. IMPRESSION: No acute abnormality noted. Electronically Signed   By: Inez Catalina M.D.   On: 10/14/2017 18:25   STUDIES:  CXR 10/20 > No acute abnormality noted CT Head 10/20 > 1. Atrophy with chronic appearing small vessel ischemic disease. No acute intracranial abnormality. Chronic paranasal sinusitis predominantly involving the sphenoid and maxillary sinuses. ECHO 10/20 >> [place] ABD 10/20 >>   CULTURES: Blood 10/20 >> Urine 10/20 >>   ANTIBIOTICS: Zosyn 10/20  >> Vancomycin 10/20 >>   SIGNIFICANT EVENTS: 10/20 > Presents to ED   LINES/TUBES: PIV   DISCUSSION: 56 year old male with unknown alcoholic cirrhosis, presents lethargic with ETOH 116, glucose 15, ammonia 113, LA > 17.   ASSESSMENT / PLAN: PULMONARY A: At risk for respiratory insufficieny  P:   Monitor > Currently protecting airway  Pulmonary Hygiene  Maintain Oxygen Saturation >92   CARDIOVASCULAR A:  Sinus Tachycardia in setting of severe dehydration and metabolic acidosis   H/O HTN, CHF  Borderline BP, on dilt  P:  Cardiac Monitoring Echo being done Hold home lasix, propranolol given BP Neo for BP support  RENAL A:   Severe Anion Gap Metabolic Acidosis with Osmolar Gap of 17 Acute on Chronic Renal Failure (Crt base 1.2-1.5)  Hypovolemic Hyponatremia  Secondary to Poor Po intake and ETOH abuse Persistent lactic acidosis P:   Trend BMP q4 hours Replace Electrolytes as indicated  Bicarb @ 100 ml/hr  F/u urine sodium Maintain foley and monitor I/O Unclear at this time what is causing osmolar gap. Will recheck. No history of methanol, ethylene glycol, isopropyl alcohol consumption, levels pending Repeat BMET now  GASTROINTESTINAL A:   Alcoholic Cirrhosis with active ETOH abuse Transaminase  H/O Esophageal varices AST/ALT 291/82  Ammonia 113  P:   Sips and chips PPI  Lactulose PO Trend LFT now U/S ABD with portal vein thrombosis that is partial (will not anticoagulate until CT of the abdomen is done), cirrhosis and portal hypertension On propanolol for portal hypertension. Will hold for now.  With increased lactic acid (acknowledge that liver and kidney functions are poor) will perform a non-contrast abdominal CT for ?of ischemia  HEMATOLOGIC A:   Anemia of Chronic Disease  Chronic Thrombocytopenia secondary to alcoholic cirrhosis PLTs 48, Baseline 40-60's  P:  Trend CBC  SCDs only  Platelets stable.  No clinical evidence of bleeding.    INFECTIOUS A:   Altered mental status r/o sepsis P:   Vanc/zosyn Trend WBC and Fever Curve  Follow Culture Data  PCT is 1 and lactic acid is rising Continue Vancomycin and Zosyn for 24-48 hours  Low clinical suspicion for SBP  ENDOCRINE A:   Hypoglycemia  Secondary to poor PO intake P:   Trend Glucose  F/u Hg A1c and TSH  NEUROLOGIC A:  Altered mental status secondary to hypoglycemia, severe anion gap metabolic acidosis, hepatic encephalopathy  ETOH 166.  Acetaminophen <96, Salicylate <7 Ammonia 789  Currently AAox3 but lethargic P:   Monitor with q6 hour neuro checks Folic Acid/Thiamine  Hold home Neurontin Hold any further benzo and sedatives for now. Will need to be closely monitored for signs of withdrawal with CIWA protocol.   Lactulose PO  Hold in the ICU, start neo and perform an abdominal CT for ?ischemia  The patient is critically ill with multiple organ systems failure and requires high complexity decision making for assessment and support, frequent evaluation and titration of therapies, application of advanced  monitoring technologies and extensive interpretation of multiple databases.   Critical Care Time devoted to patient care services described in this note is  35  Minutes. This time reflects time of care of this signee Dr Jennet Maduro. This critical care time does not reflect procedure time, or teaching time or supervisory time of PA/NP/Med student/Med Resident etc but could involve care discussion time.  Rush Farmer, M.D. [facility] Pulmonary/Critical Care Medicine. Pager: [phone]. After hours pager: [phone].  10/15/2017, 9:47 AM

## 2017-10-15 NOTE — Progress Notes (Signed)
[facility] Progress Note Patient Name: William [name] DOB: [birth year] MRN: [id number]   Date of Service  10/15/2017  HPI/Events of Note  Low magnesium  eICU Interventions  replaced     Intervention Category Minor Interventions: Electrolytes abnormality - evaluation and management  Mauri Brooklyn, P 10/15/2017, 4:05 AM

## 2017-10-15 NOTE — Progress Notes (Signed)
  Echocardiogram 2D Echocardiogram has been performed.  William [name] F 10/15/2017, 12:34 PM

## 2017-10-16 LAB — LIPASE, BLOOD: LIPASE: 185 U/L — AB (ref 11–51)

## 2017-10-16 LAB — GLUCOSE, CAPILLARY
GLUCOSE-CAPILLARY: 131 mg/dL — AB (ref 65–99)
GLUCOSE-CAPILLARY: 122 mg/dL — AB (ref 65–99)
GLUCOSE-CAPILLARY: 130 mg/dL — AB (ref 65–99)
Glucose-Capillary: 104 mg/dL — ABNORMAL HIGH (ref 65–99)
Glucose-Capillary: 121 mg/dL — ABNORMAL HIGH (ref 65–99)
Glucose-Capillary: 64 mg/dL — ABNORMAL LOW (ref 65–99)
Glucose-Capillary: 77 mg/dL (ref 65–99)

## 2017-10-16 LAB — URINE CULTURE: Culture: NO GROWTH

## 2017-10-16 LAB — CBC WITH DIFFERENTIAL/PLATELET
BASOS ABS: 0 10*3/uL (ref 0.0–0.1)
BASOS PCT: 0 %
EOS PCT: 1 %
Eosinophils Absolute: 0 10*3/uL (ref 0.0–0.7)
HCT: 21.8 % — ABNORMAL LOW (ref 39.0–52.0)
Hemoglobin: 7.9 g/dL — ABNORMAL LOW (ref 13.0–17.0)
Lymphocytes Relative: 25 %
Lymphs Abs: 0.6 10*3/uL — ABNORMAL LOW (ref 0.7–4.0)
MCH: 33.2 pg (ref 26.0–34.0)
MCHC: 36.2 g/dL — AB (ref 30.0–36.0)
MCV: 91.6 fL (ref 78.0–100.0)
MONO ABS: 0.1 10*3/uL (ref 0.1–1.0)
Monocytes Relative: 4 %
NEUTROS ABS: 1.6 10*3/uL — AB (ref 1.7–7.7)
Neutrophils Relative %: 69 %
Platelets: 32 10*3/uL — ABNORMAL LOW (ref 150–400)
RBC: 2.38 MIL/uL — ABNORMAL LOW (ref 4.22–5.81)
RDW: 15.1 % (ref 11.5–15.5)
WBC: 2.4 10*3/uL — ABNORMAL LOW (ref 4.0–10.5)

## 2017-10-16 LAB — PROCALCITONIN: Procalcitonin: 2.92 ng/mL

## 2017-10-16 LAB — COMPREHENSIVE METABOLIC PANEL
ALK PHOS: 119 U/L (ref 38–126)
ALT: 93 U/L — ABNORMAL HIGH (ref 17–63)
AST: 374 U/L — AB (ref 15–41)
Albumin: 2.2 g/dL — ABNORMAL LOW (ref 3.5–5.0)
Anion gap: 8 (ref 5–15)
BILIRUBIN TOTAL: 6.1 mg/dL — AB (ref 0.3–1.2)
BUN: 16 mg/dL (ref 6–20)
CALCIUM: 7.3 mg/dL — AB (ref 8.9–10.3)
CO2: 32 mmol/L (ref 22–32)
Chloride: 93 mmol/L — ABNORMAL LOW (ref 101–111)
Creatinine, Ser: 2.23 mg/dL — ABNORMAL HIGH (ref 0.61–1.24)
GFR calc Af Amer: 36 mL/min — ABNORMAL LOW (ref >60)
GFR, EST NON AFRICAN AMERICAN: 31 mL/min — AB (ref >60)
Glucose, Bld: 68 mg/dL (ref 65–99)
POTASSIUM: 2.8 mmol/L — AB (ref 3.5–5.1)
Sodium: 133 mmol/L — ABNORMAL LOW (ref 135–145)
TOTAL PROTEIN: 6.4 g/dL — AB (ref 6.5–8.1)

## 2017-10-16 LAB — CORTISOL: Cortisol, Plasma: 16 ug/dL

## 2017-10-16 LAB — PHOSPHORUS: PHOSPHORUS: 1.3 mg/dL — AB (ref 2.5–4.6)

## 2017-10-16 LAB — MAGNESIUM: MAGNESIUM: 1.8 mg/dL (ref 1.7–2.4)

## 2017-10-16 MED ORDER — DEXTROSE-NACL 5-0.9 % IV SOLN
INTRAVENOUS | Status: DC
  Administered 2017-10-16 – 2017-10-19 (×4): via INTRAVENOUS

## 2017-10-16 MED ORDER — POTASSIUM CHLORIDE 20 MEQ/15ML (10%) PO SOLN
40.0000 meq | Freq: Two times a day (BID) | ORAL | Status: AC
  Administered 2017-10-16 (×2): 40 meq
  Filled 2017-10-16 (×2): qty 30

## 2017-10-16 MED ORDER — DEXTROSE 5 % IV SOLN
30.0000 mmol | Freq: Once | INTRAVENOUS | Status: AC
  Administered 2017-10-16: 30 mmol via INTRAVENOUS
  Filled 2017-10-16: qty 10

## 2017-10-16 NOTE — Consult Note (Addendum)
[facility] Consult  Referring Provider: Chesley Mires, MD Primary Care Physician:  Marjie Skiff, MD Primary Gastroenterologist: Unassigned(Has been seen by [facility] inpatient)  Reason for Consultation:  Abnormal LFTs, alcoholic cirrhosis  HPI: William [name] is a 56 y.o. African American male was admitted on 10/14/17 with altered mental status, hypoglycemia, hypertension, lactic acidosis, low pH and elevated blood alcohol level.  Patient is a poor historian, states he came to the hospital only because of his brother and daughter who were concerned about him. He states he feels well and denies any complaints.  Patient is known to have alcohol related cirrhosis, continues to drink alcohol, last alcohol intake was a pint of gin 3 days ago. Patient denies vomiting blood or black colored stools, or bloody bowel movement. He denies abdominal pain, nausea or vomiting. Patient denies fever, chills, rigors, acid reflux, heartburn, difficulty or pain on swallowing, denies bloating or early satiety, denies unintentional weight loss, loss of appetite.  EGD from 9/17 showed small esophageal varices, no active bleeding. Colonoscopy from 2011 was unremarkable.   Past Medical History:  Diagnosis Date  . Anemia   . CHF (congestive heart failure) ([place])   . Chronic kidney disease   . Cirrhosis of liver ([place])   . COMPRESSION FRACTURE, LUMBAR VERTEBRAE 07/29/2010   Qualifier: Diagnosis of  By: Anabel Bene    . ETOH abuse   . Hypertension   . Prostate cancer [facility])     Past Surgical History:  Procedure Laterality Date  . ACHILLES TENDON REPAIR    . ESOPHAGOGASTRODUODENOSCOPY (EGD) WITH PROPOFOL N/A 09/13/2016   Procedure: ESOPHAGOGASTRODUODENOSCOPY (EGD) WITH PROPOFOL;  Surgeon: Wonda Horner, MD;  Location: [facility] ENDOSCOPY;  Service: Endoscopy;  Laterality: N/A;  . ORIF HUMERUS FRACTURE Right 06/30/2017   Procedure: OPEN REDUCTION INTERNAL FIXATION (ORIF) PROXIMAL HUMERUS FRACTURE;   Surgeon: Leandrew Koyanagi, MD;  Location: [facility];  Service: Orthopedics;  Laterality: Right;    Prior to Admission medications   Medication Sig Start Date End Date Taking? Authorizing Provider  allopurinol (ZYLOPRIM) 100 MG tablet Take 1 tablet (100 mg total) by mouth daily. 08/22/17  Yes Diallo, Abdoulaye, MD  Ferrous Sulfate (IRON) 325 (65 Fe) MG TABS TAKE 1 TABLET BY MOUTH ONCE DAILY 09/26/17  Yes Diallo, Abdoulaye, MD  folic acid (FOLVITE) 1 MG tablet Take 1 tablet (1 mg total) by mouth daily. 08/22/17  Yes Diallo, Abdoulaye, MD  gabapentin (NEURONTIN) 100 MG capsule Take 1 capsule (100 mg total) by mouth 3 (three) times daily. 08/22/17  Yes Diallo, Abdoulaye, MD  ibuprofen (ADVIL,MOTRIN) 600 MG tablet Take 1 tablet (600 mg total) by mouth every 6 (six) hours as needed for mild pain or moderate pain. 09/14/17  Yes Diallo, Abdoulaye, MD  magnesium oxide (MAG-OX) 400 (241.3 Mg) MG tablet Take 2 tablets (800 mg total) by mouth 2 (two) times daily. 09/04/17  Yes Diallo, Abdoulaye, MD  omeprazole (PRILOSEC) 40 MG capsule Take 1 capsule (40 mg total) by mouth daily. 09/14/17  Yes Diallo, Abdoulaye, MD  oxyCODONE (OXY IR/ROXICODONE) 5 MG immediate release tablet Take 1 tablet (5 mg total) by mouth every 8 (eight) hours as needed for breakthrough pain. 07/03/17  Yes Verner Mould, MD  propranolol (INDERAL) 40 MG tablet TAKE ONE TABLET BY MOUTH TWICE DAILY Patient taking differently: TAKE 40 mg TABLET BY MOUTH TWICE DAILY 08/09/16  Yes Vivi Barrack, MD  thiamine (VITAMIN B-1) 100 MG tablet Take 100 mg by mouth daily.   Yes [provider]    Current Facility-Administered Medications  Medication Dose Route Frequency Provider Last Rate Last Dose  . dexmedetomidine (PRECEDEX) 200 MCG/50ML (4 mcg/mL) infusion  0.2-0.7 mcg/kg/hr Intravenous Continuous Rush Farmer, MD 4.4 mL/hr at 10/16/17 0025 0.2 mcg/kg/hr at 10/16/17 0025  . dextrose 5 %-0.9 % sodium chloride infusion   Intravenous  Continuous Chesley Mires, MD 75 mL/hr at 10/16/17 0923    . folic acid injection 1 mg  1 mg Intravenous Daily Omar Person, NP   1 mg at 10/16/17 1610  . insulin aspart (novoLOG) injection 0-9 Units  0-9 Units Subcutaneous Q4H Rush Farmer, MD   1 Units at 10/16/17 0900  . lactulose (CHRONULAC) 10 GM/15ML solution 20 g  20 g Oral TID Rush Farmer, MD   20 g at 10/15/17 2213  . pantoprazole (PROTONIX) injection 40 mg  40 mg Intravenous Q24H Hayden Pedro M, NP   40 mg at 10/15/17 2200  . phenylephrine (NEO-SYNEPHRINE) 10 mg in sodium chloride 0.9 % 250 mL (0.04 mg/mL) infusion  0-400 mcg/min Intravenous Titrated Chesley Mires, MD 150 mL/hr at 10/16/17 0920 100 mcg/min at 10/16/17 0920  . piperacillin-tazobactam (ZOSYN) IVPB 3.375 g  3.375 g Intravenous [address], RPH 12.5 mL/hr at 10/16/17 0919 3.375 g at 10/16/17 0919  . potassium chloride 20 MEQ/15ML (10%) solution 40 mEq  40 mEq Per Tube BID Mannam, Praveen, MD   40 mEq at 10/16/17 0650  . potassium PHOSPHATE 30 mmol in dextrose 5 % 500 mL infusion  30 mmol Intravenous Once Mannam, Praveen, MD 85 mL/hr at 10/16/17 0908 30 mmol at 10/16/17 0908  . thiamine (B-1) injection 100 mg  100 mg Intravenous Daily Omar Person, NP   100 mg at 10/16/17 9604  . vancomycin (VANCOCIN) 1,250 mg in sodium chloride 0.9 % 250 mL IVPB  1,250 mg Intravenous Q24H Harvel Quale, [facility]   Stopped at 10/15/17 1700    Allergies as of 10/14/2017 - Review Complete 10/14/2017  Allergen Reaction Noted  . No known allergies  06/29/2017    Family History  Problem Relation Age of Onset  . Kidney disease Mother   . Arthritis Mother   . Hypertension Father     Social History   Social History  . Marital status: Single    Spouse name: N/A  . Number of children: N/A  . Years of education: N/A   Occupational History  . Not on file.   Social History Main Topics  . Smoking status: Never Smoker  . Smokeless tobacco: Never Used  .  Alcohol use Yes     Comment: 1 pint per week  . Drug use: No  . Sexual activity: Not on file   Other Topics Concern  . Not on file   Social History Narrative  . No narrative on file    Review of Systems: Positive for: GI: Described in detail in HPI.    Gen: fatigue, weakness, malaise,Denies any fever, chills, rigors, night sweats, anorexia,  involuntary weight loss, and sleep disorder CV: Denies chest pain, angina, palpitations, syncope, orthopnea, PND, peripheral edema, and claudication. Resp: Denies dyspnea, cough, sputum, wheezing, coughing up blood. GU : Denies urinary burning, blood in urine, urinary frequency, urinary hesitancy, nocturnal urination, and urinary incontinence. MS: Denies joint pain or swelling.  Denies muscle weakness, cramps, atrophy.  Derm: Denies rash, itching, oral ulcerations, hives, unhealing ulcers.  Psych: Denies depression, anxiety, memory loss, suicidal ideation, hallucinations,  and confusion. Heme:  Denies bruising, bleeding, and enlarged lymph nodes. Neuro:  Denies any headaches, dizziness, paresthesias. Endo:  Denies any problems with DM, thyroid, adrenal function.  Physical Exam: Vital signs in last 24 hours: Temp:  [97.7 F (36.5 C)-100.8 F (38.2 C)] 97.7 F (36.5 C) (10/22 0800) Pulse Rate:  [65-110] 70 (10/22 0700) Resp:  [12-24] 20 (10/22 0700) BP: (73-115)/(45-85) 106/68 (10/22 0700) SpO2:  [90 %-100 %] 94 % (10/22 0700) Weight:  [85.2 kg (187 lb 13.3 oz)] 85.2 kg (187 lb 13.3 oz) (10/22 0430) Last BM Date: 10/15/17  General:   Alert,  Well-developed, pleasant and cooperative in NAD Head:  Normocephalic and atraumatic. Eyes:  Obvious icterus, mild pallor Ears:  Normal auditory acuity. Nose:  No deformity, discharge,  or lesions. Mouth:  No deformity or lesions.  Oropharynx pink & moist. Neck:  Supple; no masses or thyromegaly. Lungs:  Clear throughout to auscultation.   No wheezes, crackles, or rhonchi. No acute distress. Decreased  air entry at bilateral bases Heart:  Regular rate and rhythm; no murmurs, clicks, rubs,  or gallops. Extremities:  Without clubbing or edema. Scar noted over left shoulder and left arm Neurologic:  Alert and  oriented x4;  grossly normal neurologically. No asterixis, mild tremor Skin:  Intact without significant lesions or rashes. Psych:  Alert and cooperative. Normal mood and affect. Abdomen:  Soft, nontender and nondistended. No masses, hepatosplenomegaly or hernias noted. Normal bowel sounds, without guarding, and without rebound, no fluid thrill or shifting dullness, tympanitic on percussion.     Lab Results:  Recent Labs  10/14/17 1800 10/15/17 0018 10/16/17 0338  WBC 6.3 3.1* 2.4*  HGB 10.1* 8.8* 7.9*  HCT 29.6* 26.6* 21.8*  PLT 48* 35* 32*   BMET  Recent Labs  10/15/17 0018 10/15/17 1345 10/16/17 0338  NA 134* 130* 133*  K 3.8 3.5 2.8*  CL 94* 93* 93*  CO2 <7* 25 32  GLUCOSE 74 190* 68  BUN 14 17 16   CREATININE 2.34* 2.74* 2.23*  CALCIUM 7.9* 7.2* 7.3*   LFT  Recent Labs  10/15/17 0018  10/16/17 0338  PROT 7.1  < > 6.4*  ALBUMIN 2.5*  < > 2.2*  AST 403*  < > 374*  ALT 94*  < > 93*  ALKPHOS 143*  < > 119  BILITOT 6.0*  < > 6.1*  BILIDIR 3.5*  --   --   IBILI 2.5*  --   --   < > = values in this interval not displayed. PT/INR  Recent Labs  10/14/17 2124  LABPROT 20.0*  INR 1.72    Studies/Results: Ct Abdomen Pelvis Wo Contrast  Result Date: 10/16/2017 CLINICAL DATA:  Elevated lactic acid with hepatic and renal dysfunction. Abdominal distention. Assess for bowel ischemia. Ultrasound abdomen with portal vein partial thrombosis. EXAM: CT ABDOMEN AND PELVIS WITHOUT CONTRAST TECHNIQUE: Multidetector CT imaging of the abdomen and pelvis was performed following the standard protocol without IV contrast. COMPARISON:  06/27/2017 FINDINGS: Lower chest: Mild cardiomegaly without pericardial effusion. There is aortic atherosclerosis with coronary  arteriosclerosis. Bilateral pleural effusions are noted, trace on the right and small on the left with adjacent atelectatic change. Hepatobiliary: Cirrhosis of the liver with surface nodularity. No biliary dilatation or portal venous gas. Distention of the gallbladder without mural thickening. Layering calculi are noted near the neck of the gallbladder. No choledocholithiasis. Pancreas: No ductal dilatation or space-occupying mass. Spleen: Mild splenomegaly with the spleen measuring up to 17 cm AP. Adrenals/Urinary Tract: Normal bilateral  adrenal glands and unenhanced kidneys. Decompressed appearance of the ureters. Foley decompression of the bladder. Stomach/Bowel: Decompressed stomach with normal small bowel rotation. No bowel obstruction. No pneumatosis. The appendix is unremarkable. No large bowel dilatation. There is scattered colonic diverticulosis without acute diverticulitis. Vascular/Lymphatic: Moderate aortoiliac atherosclerosis without aneurysm. No lymphadenopathy. Reproductive: Unremarkable prostate and seminal vesicles. Other: Small volume of ascites noted in the paracolic gutters adjacent to the right hepatic lobe. Trace fluid in the pelvis. Musculoskeletal: Chronic stable mild superior endplate compression of L2 with degenerative disc disease L5-S1. Bilateral gynecomastia. IMPRESSION: 1. Mild cardiomegaly with coronary arteriosclerosis and trace right and small left pleural effusions. 2. Sclerotic appearance of the liver with stable splenomegaly and small volume of ascites. 3. No evidence of bowel obstruction nor ischemia. 4. Moderate aortoiliac atherosclerosis without aneurysm. 5. Chronic stable superior endplate compression of L2 with degenerative disc disease at L5-S1. Electronically Signed   By: Ashley Royalty M.D.   On: 10/16/2017 00:22   Ct Head Wo Contrast  Result Date: 10/14/2017 CLINICAL DATA:  Altered level of consciousness, hypertension and history prostate cancer. EXAM: CT HEAD WITHOUT  CONTRAST TECHNIQUE: Contiguous axial images were obtained from the base of the skull through the vertex without intravenous contrast. COMPARISON:  05/18/2010 FINDINGS: Brain: Superficial and central atrophy with chronic appearing small vessel ischemic disease. No large vascular territory infarct, hemorrhage or midline shift. No intra-axial mass nor extra-axial fluid collections. Vascular: Mild-to-moderate atherosclerosis of the carotid siphons. Skull: No acute skull fracture or suspicious osseous lesions. Sinuses/Orbits: Circumferential mild-to-moderate mucosal thickening of the maxillary and sphenoid sinuses consistent chronic paranasal sinusitis. Clear mastoids. Motion artifacts limit assessment at the skullbase. Other: None IMPRESSION: 1. Atrophy with chronic appearing small vessel ischemic disease. 2. No acute intracranial abnormality. 3. Chronic paranasal sinusitis predominantly involving the sphenoid and maxillary sinuses. Electronically Signed   By: Ashley Royalty M.D.   On: 10/14/2017 19:04   US Abdomen Complete  Result Date: 10/14/2017 CLINICAL DATA:  Right upper quadrant pain. History of splenomegaly, hypertension, prostate cancer, hepatic cirrhosis, cholelithiasis. EXAM: ABDOMEN ULTRASOUND COMPLETE COMPARISON:  CT abdomen and pelvis 06/27/2017 FINDINGS: Gallbladder: Gallbladder is distended. Multiple small stones in the gallbladder layering along the dependent surface. No gallbladder wall thickening or edema. Murphy's sign is negative. Common bile duct: Diameter: 6.7 mm, normal Liver: Changes of hepatic cirrhosis with diffuse heterogeneous parenchymal echotexture and nodular contour. Free fluid is demonstrated around the liver edge consistent with ascites. No focal lesions are identified but poor penetration limits evaluation. Color flow Doppler imaging of the main portal vein shows no flow in the mid and distal portal vein with segmental flow demonstrated in the proximal portal vein demonstrated flow  reversal. IVC: No abnormality visualized. Pancreas: Visualized portion unremarkable. Spleen: Spleen is enlarged with spleen length measuring 13.5 cm. Calculated volume is 774 mL. Right Kidney: Length: 11.1 cm. Echogenicity within normal limits. No mass or hydronephrosis visualized. Left Kidney: Length: 11.9 cm. Visualization is limited due to poor penetration. Echogenicity appears within normal limits. No mass or hydronephrosis visualized. Abdominal aorta: No aneurysm visualized. Other findings: Examination is somewhat technically limited due to patient's limited mobility, body habitus, and bowel gas. IMPRESSION: 1. Cholelithiasis with distended gallbladder. No additional changes to suggest cholecystitis. 2. Changes of hepatic cirrhosis with portal venous hypertension. 3. Partial portal vein thrombosis with reversal of flow in the proximal portal vein. 4. Splenic enlargement. Electronically Signed   By: Lucienne Capers M.D.   On: 10/14/2017 22:28   Dg Chest Portable 1  View  Result Date: 10/14/2017 CLINICAL DATA:  Altered mental status EXAM: PORTABLE CHEST 1 VIEW COMPARISON:  06/27/2017 FINDINGS: Cardiac shadow is within normal limits. Mild aortic calcifications are seen. Lungs are well aerated bilaterally. No focal infiltrate or sizable effusion is seen. Postsurgical changes are noted in the proximal left humerus. IMPRESSION: No acute abnormality noted. Electronically Signed   By: Inez Catalina M.D.   On: 10/14/2017 18:25    Impression: 1. Alcoholic cirrhosis, small esophageal varices(9/17), no ascites, mild hepatic encephalopathy MELD Na 28 on admission. 2. Alcoholic hepatitis, hepatic discriminant function 43.  3. Pancytopenia(WBC 2.4, hemoglobin 7.9, platelet 32) likely from underlying cirrhosis 4. Renal dysfunction(BUN 16, creatinine 2.23, GFR 36) 5. Electrolyte impairment(low-sodium, potassium, magnesium) 6. Sepsis-hypotension, lactic acidosis, elevated pro-calcitonin) 7. Hypoglycemia 8.  Alcohol abuse,Blood alcohol level 166 on admission. 9. Cholelithiasis with distended gallbladder, no cholecystitis, normal-appearing CBD 10. Partial portal vein thrombosis, splenomegaly-related to portal hypertension.  Plan: 1. Continue IV fluid D5 normal saline at 75 mL per hour, along with supplementation of thiamine and folic acid. 2. Monitor for alcohol withdrawal symptoms. 3. Not a candidate for prednisolone therapy for alcoholic hepatitis(discriminant function more than 32), because of features of sepsis(remains on IV Zosyn and IV vancomycin). Will avoid pentoxifylline due to renal impairment and thrombocytopenia which may increase risk of bleeding. 4. Agree with continuing lactulose 20 g 3 times a day. 5. Recommend patient be started on regular diet. 6. Receiving potassium and magnesium supplementation accordingly. Patient continues to be on IV phenylephrine for hypotension.   LOS: 2 days   Ronnette Juniper, M.D.  10/16/2017, 9:39 AM  Pager [phone] If no answer or after 5 PM call [phone]

## 2017-10-16 NOTE — Progress Notes (Signed)
PULMONARY / CRITICAL CARE MEDICINE   Name: William [name] MRN: [id number] DOB: 11/25/1961    ADMISSION DATE:  10/14/2017 CONSULTATION DATE:  10/14/2017  REFERRING MD:  Dr. Ellender Hose   CHIEF COMPLAINT:  Encephalopathy   HISTORY OF PRESENT ILLNESS:   56 yo male presented with altered mental status from hypoglycemia in setting of ETOH abuse and poor po intake. PMHx of Alcohol cirrhosis with esophageal varices, HTN, CKD 3, CHF, anemia.  SUBJECTIVE:  No events overnight, hungry  VITAL SIGNS: BP 106/68   Pulse 70   Temp 98.1 F (36.7 C)   Resp 20   Ht 5\' 11"  (1.803 m)   Wt 187 lb 13.3 oz (85.2 kg)   SpO2 94%   BMI 26.20 kg/m   INTAKE / OUTPUT: I/O last 3 completed shifts: In: 10506.7 [I.V.:7006.7; IV Piggyback:3500] Out: 6712 [Urine:1490; Stool:1]  PHYSICAL EXAMINATION:  General - alert Eyes - jaundice ENT - no sinus tenderness, no oral exudate, no LAN Cardiac - regular, no murmur Chest - no wheeze, rales Abd - soft, non tender Ext - no edema, decreased muscle bulk Skin - no rashes Neuro - follows commands   LABS:  BMET  Recent Labs Lab 10/15/17 0018 10/15/17 1345 10/16/17 0338  NA 134* 130* 133*  K 3.8 3.5 2.8*  CL 94* 93* 93*  CO2 <7* 25 32  BUN 14 17 16   CREATININE 2.34* 2.74* 2.23*  GLUCOSE 74 190* 68   Electrolytes  Recent Labs Lab 10/14/17 2124 10/15/17 0018 10/15/17 1345 10/16/17 0338  CALCIUM  --  7.9* 7.2* 7.3*  MG 1.0* 0.9*  --  1.8  PHOS  --  5.2*  --  1.3*   CBC  Recent Labs Lab 10/14/17 1800 10/15/17 0018 10/16/17 0338  WBC 6.3 3.1* 2.4*  HGB 10.1* 8.8* 7.9*  HCT 29.6* 26.6* 21.8*  PLT 48* 35* 32*   Coag's  Recent Labs Lab 10/14/17 2124  INR 1.72   Sepsis Markers  Recent Labs Lab 10/14/17 1813 10/14/17 2124 10/15/17 0018 10/16/17 0338  LATICACIDVEN >17.00* 19.5* 18.3*  --   PROCALCITON  --  0.85 1.00 2.92   ABG  Recent Labs Lab 10/14/17 2010 10/15/17 0020 10/15/17 1001  PHART 7.189* 7.232*  7.478*  PCO2ART <15.0* VALUE BELOW RANGE 28.2*  PO2ART 148.0* 95.6 63.8*   Liver Enzymes  Recent Labs Lab 10/15/17 0018 10/15/17 1345 10/16/17 0338  AST 403* 413* 374*  ALT 94* 91* 93*  ALKPHOS 143* 117 119  BILITOT 6.0* 5.3* 6.1*  ALBUMIN 2.5* 2.1* 2.2*   Cardiac Enzymes  Recent Labs Lab 10/15/17 0018  TROPONINI 0.03*   Glucose  Recent Labs Lab 10/15/17 1152 10/15/17 1958 10/15/17 2356 10/16/17 0339 10/16/17 0428 10/16/17 0826  GLUCAP 180* 192* 169* 64* 77 121*   Imaging Ct Abdomen Pelvis Wo Contrast  Result Date: 10/16/2017 CLINICAL DATA:  Elevated lactic acid with hepatic and renal dysfunction. Abdominal distention. Assess for bowel ischemia. Ultrasound abdomen with portal vein partial thrombosis. EXAM: CT ABDOMEN AND PELVIS WITHOUT CONTRAST TECHNIQUE: Multidetector CT imaging of the abdomen and pelvis was performed following the standard protocol without IV contrast. COMPARISON:  06/27/2017 FINDINGS: Lower chest: Mild cardiomegaly without pericardial effusion. There is aortic atherosclerosis with coronary arteriosclerosis. Bilateral pleural effusions are noted, trace on the right and small on the left with adjacent atelectatic change. Hepatobiliary: Cirrhosis of the liver with surface nodularity. No biliary dilatation or portal venous gas. Distention of the gallbladder without mural thickening. Layering calculi are noted near  the neck of the gallbladder. No choledocholithiasis. Pancreas: No ductal dilatation or space-occupying mass. Spleen: Mild splenomegaly with the spleen measuring up to 17 cm AP. Adrenals/Urinary Tract: Normal bilateral adrenal glands and unenhanced kidneys. Decompressed appearance of the ureters. Foley decompression of the bladder. Stomach/Bowel: Decompressed stomach with normal small bowel rotation. No bowel obstruction. No pneumatosis. The appendix is unremarkable. No large bowel dilatation. There is scattered colonic diverticulosis without acute  diverticulitis. Vascular/Lymphatic: Moderate aortoiliac atherosclerosis without aneurysm. No lymphadenopathy. Reproductive: Unremarkable prostate and seminal vesicles. Other: Small volume of ascites noted in the paracolic gutters adjacent to the right hepatic lobe. Trace fluid in the pelvis. Musculoskeletal: Chronic stable mild superior endplate compression of L2 with degenerative disc disease L5-S1. Bilateral gynecomastia. IMPRESSION: 1. Mild cardiomegaly with coronary arteriosclerosis and trace right and small left pleural effusions. 2. Sclerotic appearance of the liver with stable splenomegaly and small volume of ascites. 3. No evidence of bowel obstruction nor ischemia. 4. Moderate aortoiliac atherosclerosis without aneurysm. 5. Chronic stable superior endplate compression of L2 with degenerative disc disease at L5-S1. Electronically Signed   By: Ashley Royalty M.D.   On: 10/16/2017 00:22   STUDIES:  CT Head 10/20 >> atrophy, chronic small vessel ischemic changes ECHO 10/20 >> EF 65 to 70%, severe LA dilation [place] ABD 10/20 >> cholelithiasis with distended GB, cirrhosis with portal HTN, partial portal vein thrombosis, splenomegaly CT abd/pelvis 10/21 >> atherosclerosis, b/l effusions, cirrhosis, distended GB, cholelithiasis, mild splenomegaly  CULTURES: Blood 10/20 >> Urine 10/20 >> negative  ANTIBIOTICS: Zosyn 10/20 >> Vancomycin 10/20 >> 10/22  SIGNIFICANT EVENTS: 10/20 Presents to ED  10/22 GI consulted  LINES/TUBES:  DISCUSSION: 56 yo with altered mental status from hypoglycemia, hepatic encephalopathy, sepsis, AKI.  Hx of ETOH with cirrhosis.  ASSESSMENT / PLAN:  Alcoholic cirrhosis with hx of varices, splenomegaly. Cholelithiasis with distended GB. - f/u LFT - hold outpt inderal while hypotensive - consult GI  Sepsis. - day 3 of Abx, d/c vancomycin - pressors to keep MAP > 55  Acute renal failure with ATN >> baseline creatinine 1.17 from 07/04/17. Lactic  acidosis. Hypomagnesemia, hypokalemia, hypophosphatemia. - continue IV fluids >> d/c HCO3 from IV fluids - f/u and replace electrolytes as needed - hold outpt lasix  Severe protein calorie malnutrition. - advance diet as able  Acute metabolic encephalopathy 2nd to hypoglycemia and hepatic encephalopathy. Hx of neuropathy. - continue lactulose - continue thiamine, folic acid - hold outpt neurontin  Anemia of critical illness and chronic disease. Leukopenia, Thrombocytopenia in setting of ETOH. - f/u CBC  Hyperglycemia. Hx of gout. - SSI - hold outpt allopurinol  DVT prophylaxis - SCDs SUP - Protonix Nutrition - NPO Goals of care - full code   CC time 31 minutes  Chesley Mires, MD [place] 10/16/2017, 9:01 AM Pager:  [phone] After 3pm call: [phone]

## 2017-10-16 NOTE — Progress Notes (Signed)
[facility] Progress Note Patient Name: William [name] DOB: April 15, 1961 MRN: [id number]   Date of Service  10/16/2017  HPI/Events of Note  Low K, Phos, Mg  eICU Interventions  repleted        William [name] 10/16/2017, 6:26 AM

## 2017-10-16 NOTE — Plan of Care (Signed)
Problem: Pain Managment: Goal: General experience of comfort will improve Outcome: Progressing Pt denies pain.  Problem: Activity: Goal: Risk for activity intolerance will decrease Outcome: Progressing Pt able to turn self in bed.  Problem: Nutrition: Goal: Adequate nutrition will be maintained Outcome: Progressing Pt advanced to regular diet, tolerating without difficulty.

## 2017-10-16 NOTE — Progress Notes (Signed)
0400 CBG was 64. 6oz of grape juice was given and I will recheck CBG in 20 minutes.

## 2017-10-16 NOTE — Progress Notes (Signed)
[facility] ADULT ICU REPLACEMENT PROTOCOL FOR AM LAB REPLACEMENT ONLY  The patient does not apply for the [facility] Adult ICU Electrolyte Replacment Protocol based on the criteria listed below:   Is GFR >/= 40 ml/min? No.  Patient's GFR today is 31    Abnormal electrolyte(s): K=2.8, Mg=1.8,Phos1.3   If a panic level lab has been reported, has the CCM MD in charge been notified? Yes.  .   Physician:  Vaughan Browner, MD  [facility] 10/16/2017 6:07 AM

## 2017-10-17 ENCOUNTER — Encounter (HOSPITAL_COMMUNITY): Payer: Self-pay | Admitting: General Practice

## 2017-10-17 LAB — CBC WITH DIFFERENTIAL/PLATELET
BASOS ABS: 0 10*3/uL (ref 0.0–0.1)
BASOS PCT: 0 %
EOS ABS: 0 10*3/uL (ref 0.0–0.7)
Eosinophils Relative: 2 %
HCT: 22.3 % — ABNORMAL LOW (ref 39.0–52.0)
HEMOGLOBIN: 8.1 g/dL — AB (ref 13.0–17.0)
Lymphocytes Relative: 40 %
Lymphs Abs: 0.8 10*3/uL (ref 0.7–4.0)
MCH: 33.2 pg (ref 26.0–34.0)
MCHC: 36.3 g/dL — AB (ref 30.0–36.0)
MCV: 91.4 fL (ref 78.0–100.0)
MONOS PCT: 5 %
Monocytes Absolute: 0.1 10*3/uL (ref 0.1–1.0)
NEUTROS PCT: 53 %
Neutro Abs: 1 10*3/uL — ABNORMAL LOW (ref 1.7–7.7)
Platelets: 24 10*3/uL — CL (ref 150–400)
RBC: 2.44 MIL/uL — ABNORMAL LOW (ref 4.22–5.81)
RDW: 15.1 % (ref 11.5–15.5)
WBC: 1.9 10*3/uL — AB (ref 4.0–10.5)

## 2017-10-17 LAB — COMPREHENSIVE METABOLIC PANEL
ALK PHOS: 111 U/L (ref 38–126)
ALT: 82 U/L — AB (ref 17–63)
ANION GAP: 9 (ref 5–15)
AST: 280 U/L — ABNORMAL HIGH (ref 15–41)
Albumin: 2.2 g/dL — ABNORMAL LOW (ref 3.5–5.0)
BILIRUBIN TOTAL: 7 mg/dL — AB (ref 0.3–1.2)
BUN: 10 mg/dL (ref 6–20)
CALCIUM: 7.1 mg/dL — AB (ref 8.9–10.3)
CO2: 28 mmol/L (ref 22–32)
CREATININE: 1.56 mg/dL — AB (ref 0.61–1.24)
Chloride: 94 mmol/L — ABNORMAL LOW (ref 101–111)
GFR calc non Af Amer: 47 mL/min — ABNORMAL LOW (ref >60)
GFR, EST AFRICAN AMERICAN: 55 mL/min — AB (ref >60)
GLUCOSE: 108 mg/dL — AB (ref 65–99)
Potassium: 3.1 mmol/L — ABNORMAL LOW (ref 3.5–5.1)
Sodium: 131 mmol/L — ABNORMAL LOW (ref 135–145)
Total Protein: 6.1 g/dL — ABNORMAL LOW (ref 6.5–8.1)

## 2017-10-17 LAB — CBC
HEMATOCRIT: 20.6 % — AB (ref 39.0–52.0)
HEMOGLOBIN: 7.5 g/dL — AB (ref 13.0–17.0)
MCH: 33.3 pg (ref 26.0–34.0)
MCHC: 36.4 g/dL — ABNORMAL HIGH (ref 30.0–36.0)
MCV: 91.6 fL (ref 78.0–100.0)
Platelets: 28 10*3/uL — CL (ref 150–400)
RBC: 2.25 MIL/uL — ABNORMAL LOW (ref 4.22–5.81)
RDW: 15.2 % (ref 11.5–15.5)
WBC: 1.6 10*3/uL — AB (ref 4.0–10.5)

## 2017-10-17 LAB — MAGNESIUM: Magnesium: 1.2 mg/dL — ABNORMAL LOW (ref 1.7–2.4)

## 2017-10-17 LAB — PHOSPHORUS: Phosphorus: 1.5 mg/dL — ABNORMAL LOW (ref 2.5–4.6)

## 2017-10-17 LAB — GLUCOSE, CAPILLARY
GLUCOSE-CAPILLARY: 105 mg/dL — AB (ref 65–99)
Glucose-Capillary: 115 mg/dL — ABNORMAL HIGH (ref 65–99)

## 2017-10-17 MED ORDER — LORAZEPAM 2 MG/ML IJ SOLN: 1.0000 mg | Freq: Four times a day (QID) | INTRAMUSCULAR | Status: DC | PRN

## 2017-10-17 MED ORDER — VITAMIN B-1 100 MG PO TABS
100.0000 mg | ORAL_TABLET | Freq: Every day | ORAL | Status: DC
  Administered 2017-10-17 – 2017-10-20 (×4): 100 mg via ORAL
  Filled 2017-10-17 (×4): qty 1

## 2017-10-17 MED ORDER — FOLIC ACID 1 MG PO TABS
1.0000 mg | ORAL_TABLET | Freq: Every day | ORAL | Status: DC
  Administered 2017-10-17 – 2017-10-20 (×4): 1 mg via ORAL
  Filled 2017-10-17 (×4): qty 1

## 2017-10-17 MED ORDER — LACTULOSE 10 GM/15ML PO SOLN
20.0000 g | Freq: Two times a day (BID) | ORAL | Status: DC
  Administered 2017-10-17 – 2017-10-19 (×4): 20 g via ORAL
  Filled 2017-10-17 (×6): qty 30

## 2017-10-17 MED ORDER — PANTOPRAZOLE SODIUM 40 MG PO TBEC
40.0000 mg | DELAYED_RELEASE_TABLET | Freq: Every day | ORAL | Status: DC
  Administered 2017-10-17 – 2017-10-20 (×3): 40 mg via ORAL
  Filled 2017-10-17 (×3): qty 1

## 2017-10-17 MED ORDER — LORAZEPAM 1 MG PO TABS: 1.0000 mg | ORAL_TABLET | Freq: Four times a day (QID) | ORAL | Status: DC | PRN

## 2017-10-17 NOTE — Progress Notes (Signed)
NURSING PROGRESS NOTE  William [name] [id number] Transfer Data: 10/17/2017 11:32 AM Attending Provider: Chesley Mires, MD VVO:[id number], Earna Coder, MD Code Status: FULL   William [name] is a 56 y.o. male patient transferred from [place]  -No acute distress noted.  -No complaints of shortness of breath.  -No complaints of chest pain.   Last Documented Vital Signs: Blood pressure 126/73, pulse 94, temperature 98.7 F (37.1 C), temperature source Oral, resp. rate 16, height 5\' 11"  (1.803 m), weight 96.1 kg (211 lb 14.4 oz), SpO2 99 %.  IV Fluids:  IV in place, occlusive dsg intact without redness, IV cath hand left, condition patent and no redness IV push only, no IV fluids.   Allergies:  No known allergies  Past Medical History:   has a past medical history of Anemia; CHF (congestive heart failure) ([place]); Chronic kidney disease; Cirrhosis of liver ([place]); COMPRESSION FRACTURE, LUMBAR VERTEBRAE (07/29/2010); ETOH abuse; Hypertension; and Prostate cancer ([place]).  Past Surgical History:   has a past surgical history that includes Achilles tendon repair; Esophagogastroduodenoscopy (egd) with propofol (N/A, 09/13/2016); and ORIF humerus fracture (Right, 06/30/2017).  Social History:   reports that he has never smoked. He has never used smokeless tobacco. He reports that he drinks alcohol. He reports that he does not use drugs.  Skin: intact except where otherwise charted   Patient/Family orientated to room. Information packet given to patient/family. Admission inpatient armband information verified with patient/family to include name and date of birth and placed on patient arm. Side rails up x 2, fall assessment and education completed with patient/family. Patient/family able to verbalize understanding of risk associated with falls and verbalized understanding to call for assistance before getting out of bed. Call light within reach. Patient/family able to voice and demonstrate understanding of unit  orientation instructions.

## 2017-10-17 NOTE — Consult Note (Signed)
   [facility] CM Inpatient Consult   10/17/2017  William [name] [birth year] [id number]  Referral received for [facility] Care Management. Patient was admitted with Acute Encephalopathy and Hx of closed fracture of the Humerus and had spent time in rehab skilled facility.  Met with the patient at the bedside. Patient is alert oriented x3. HIPAA provided.  Patient states he is not having any difficulties at home since he left rehab [[facility]].  Patient endorses his primary care provider to be [facility].  Patient states that his sister helps him get to appointments.  He states his brother lives nearby.  He did not feel that he had any needs at this time. Share with the patient a brochure, 24 hour nurse advise line magnet and contact information.  He declines follow up at this time. Thanks for the referral/consult. He has used home health in the past.  For questions,   Natividad Brood, RN BSN [facility] Liaison  [phone] business mobile phone Toll free office [phone]

## 2017-10-17 NOTE — Progress Notes (Addendum)
[facility] Progress Note  William [name] 56 y.o. [birth year]  CC:  alcoholic hepatitis/abnormal LFT   Subjective: Patient is feeling better. Currently eating breakfast without any discomfort. Denied abdominal pain, nausea or vomiting.  ROS : Denied chest pain. Shortness of breath resolved.   Objective: Vital signs in last 24 hours: Vitals:   10/17/17 0700 10/17/17 0800  BP: 107/73   Pulse: 90   Resp: 14   Temp:  98.4 F (36.9 C)  SpO2: 97%     Physical Exam:  General:  Alert, cooperative, no distress, appears stated age  Head:  Normocephalic, without obvious abnormality, atraumatic  Eyes:  , EOM's intact, scleral icterus noted   Lungs:   Decreased breath sounds bilaterally. Anterior exam only., respirations unlabored  Heart:  Regular rate and rhythm, S1, S2 normal  Abdomen:   Soft, non-tender, bowel sounds active all four quadrants,  no masses,   Extremities: Extremities normal, no  edema       Lab Results:  Recent Labs  10/16/17 0338 10/17/17 0505  NA 133* 131*  K 2.8* 3.1*  CL 93* 94*  CO2 32 28  GLUCOSE 68 108*  BUN 16 10  CREATININE 2.23* 1.56*  CALCIUM 7.3* 7.1*  MG 1.8 1.2*  PHOS 1.3* 1.5*    Recent Labs  10/16/17 0338 10/17/17 0505  AST 374* 280*  ALT 93* 82*  ALKPHOS 119 111  BILITOT 6.1* 7.0*  PROT 6.4* 6.1*  ALBUMIN 2.2* 2.2*    Recent Labs  10/14/17 1800  10/16/17 0338 10/17/17 0505  WBC 6.3  < > 2.4* 1.6*  NEUTROABS 5.3  --  1.6*  --   HGB 10.1*  < > 7.9* 7.5*  HCT 29.6*  < > 21.8* 20.6*  MCV 99.3  < > 91.6 91.6  PLT 48*  < > 32* 28*  < > = values in this interval not displayed.  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 - History of encephalopathy. Currently alert and oriented 3 - Kidney insufficiency. Improving -  Pancytopenia  Recommendations ------------------------- - Patient is feeling better., Continue supportive care for now. Not a candidate for prednisone and Trental at this time. - Repeat PT/INR, CMP  tomorrow to recalculate discomfort function score. - Absolute alcohol abstinence advised to patient. - GI will follow   Otis Brace MD, [place] 10/17/2017, 9:11 AM  Pager [phone]  If no answer or after 5 PM call [phone]

## 2017-10-17 NOTE — Progress Notes (Signed)
Pharmacy Antibiotic Note  William [name] is a 56 y.o. male admitted on 10/14/2017 with AMS and hypoglycemia. Hypothermic. Unknown source, currently on D#3 of Zosyn , SCr 1.56, eCrCl 60 mL/min ml/min. WBC down to 1.6   Plan: -Zosyn 3.375 g IV q8h -Monitor renal fx, cultures, VT at [facility]   Height: 5\' 11"  (180.3 cm) Weight: 201 lb 1 oz (91.2 kg) IBW/kg (Calculated) : 75.3  Temp (24hrs), Avg:98.5 F (36.9 C), Min:98.4 F (36.9 C), Max:98.9 F (37.2 C)   Recent Labs Lab 10/14/17 1800 10/14/17 1813 10/14/17 2124 10/15/17 0018 10/15/17 1345 10/16/17 0338 10/17/17 0505  WBC 6.3  --   --  3.1*  --  2.4* 1.6*  CREATININE 2.33*  --   --  2.34* 2.74* 2.23* 1.56*  LATICACIDVEN  --  >17.00* 19.5* 18.3*  --   --   --     Estimated Creatinine Clearance: 59.6 mL/min (A) (by C-G formula based on SCr of 1.56 mg/dL (H)).    Allergies  Allergen Reactions  . No Known Allergies     Antimicrobials this admission: 10/20 vancomycin >10/22 10/20 zosyn >  Dose adjustments this admission: N/A  Microbiology results: 10/20 urine cx: neg 10/20 blood cx: ngtd   Albertina Parr, PharmD., BCPS Clinical Pharmacist Pager [phone]

## 2017-10-17 NOTE — Progress Notes (Signed)
PULMONARY / CRITICAL CARE MEDICINE   Name: William [name] MRN: [id number] DOB: 1961-12-05    ADMISSION DATE:  10/14/2017 CONSULTATION DATE:  10/14/2017  REFERRING MD:  Dr. Ellender Hose   CHIEF COMPLAINT:  Encephalopathy   HISTORY OF PRESENT ILLNESS:   56 yo male presented with altered mental status from hypoglycemia in setting of ETOH abuse and poor po intake. PMHx of Alcohol cirrhosis with esophageal varices, HTN, CKD 3, CHF, anemia.  SUBJECTIVE:  Slept well.  Denies chest pain, or abdominal pain.  VITAL SIGNS: BP 107/73   Pulse 90   Temp 98.4 F (36.9 C) (Oral)   Resp 14   Ht 5\' 11"  (1.803 m)   Wt 201 lb 1 oz (91.2 kg)   SpO2 97%   BMI 28.04 kg/m   INTAKE / OUTPUT: I/O last 3 completed shifts: In: 5418.9 [I.V.:5218.9; IV Piggyback:200] Out: 2542 [Urine:3390]  PHYSICAL EXAMINATION:  General - pleasant Eyes - pupils reactive, jaundiced ENT - no sinus tenderness, no oral exudate, no LAN Cardiac - regular, no murmur Chest - no wheeze, rales Abd - soft, non tender Ext - no edema Skin - no rashes Neuro - normal strength Psych - normal mood  LABS:  BMET  Recent Labs Lab 10/15/17 1345 10/16/17 0338 10/17/17 0505  NA 130* 133* 131*  K 3.5 2.8* 3.1*  CL 93* 93* 94*  CO2 25 32 28  BUN 17 16 10   CREATININE 2.74* 2.23* 1.56*  GLUCOSE 190* 68 108*   Electrolytes  Recent Labs Lab 10/15/17 0018 10/15/17 1345 10/16/17 0338 10/17/17 0505  CALCIUM 7.9* 7.2* 7.3* 7.1*  MG 0.9*  --  1.8 1.2*  PHOS 5.2*  --  1.3* 1.5*   CBC  Recent Labs Lab 10/15/17 0018 10/16/17 0338 10/17/17 0505  WBC 3.1* 2.4* 1.6*  HGB 8.8* 7.9* 7.5*  HCT 26.6* 21.8* 20.6*  PLT 35* 32* 28*   Coag's  Recent Labs Lab 10/14/17 2124  INR 1.72   Sepsis Markers  Recent Labs Lab 10/14/17 1813 10/14/17 2124 10/15/17 0018 10/16/17 0338  LATICACIDVEN >17.00* 19.5* 18.3*  --   PROCALCITON  --  0.85 1.00 2.92   ABG  Recent Labs Lab 10/14/17 2010 10/15/17 0020  10/15/17 1001  PHART 7.189* 7.232* 7.478*  PCO2ART <15.0* VALUE BELOW RANGE 28.2*  PO2ART 148.0* 95.6 63.8*   Liver Enzymes  Recent Labs Lab 10/15/17 1345 10/16/17 0338 10/17/17 0505  AST 413* 374* 280*  ALT 91* 93* 82*  ALKPHOS 117 119 111  BILITOT 5.3* 6.1* 7.0*  ALBUMIN 2.1* 2.2* 2.2*   Cardiac Enzymes  Recent Labs Lab 10/15/17 0018  TROPONINI 0.03*   Glucose  Recent Labs Lab 10/16/17 1220 10/16/17 1636 10/16/17 1944 10/16/17 2334 10/17/17 0345 10/17/17 0800  GLUCAP 131* 122* 130* 104* 115* 105*   Imaging No results found.   STUDIES:  CT Head 10/20 >> atrophy, chronic small vessel ischemic changes ECHO 10/20 >> EF 65 to 70%, severe LA dilation [place] ABD 10/20 >> cholelithiasis with distended GB, cirrhosis with portal HTN, partial portal vein thrombosis, splenomegaly CT abd/pelvis 10/21 >> atherosclerosis, b/l effusions, cirrhosis, distended GB, cholelithiasis, mild splenomegaly  CULTURES: Blood 10/20 >> Urine 10/20 >> negative  ANTIBIOTICS: Zosyn 10/20 >> Vancomycin 10/20 >> 10/22  SIGNIFICANT EVENTS: 10/20 Presents to ED  10/22 GI consulted 10/23 Off pressors, off precedex, transfer to floor bed  LINES/TUBES:  DISCUSSION: 56 yo with altered mental status from hypoglycemia, hepatic encephalopathy, sepsis, AKI.  Hx of ETOH with cirrhosis.  ASSESSMENT / PLAN:  Alcoholic cirrhosis with hx of varices, splenomegaly. Cholelithiasis with distended GB. - appreciate help from GI - f/u LFT's intermittently - hold outpt inderal for now >> if BP remains stable, then resume  Sepsis. - day 4 of Abx - goal MAP > 55  Acute renal failure with ATN >> baseline creatinine 1.17 from 07/04/17. Lactic acidosis. Hypomagnesemia, hypokalemia, hypophosphatemia. - continue IV fluids at 50 ml/hr for now - replace electrolytes as needed - hold outpt lasix  Severe protein calorie malnutrition. - continue diet  Acute metabolic encephalopathy 2nd to  hypoglycemia and hepatic encephalopathy >> improved. Hx of neuropathy. - change lactulose to bid 10/23 - continue thiamine, folic acid - hold outpt neurontin  Anemia of critical illness and chronic disease. Leukopenia, Thrombocytopenia in setting of ETOH. - f/u CBC  Hyperglycemia >> improved. Hx of gout. - d/c SSI - hold outpt allopurinol  Deconditioning. - PT assessment  DVT prophylaxis - SCDs SUP - Protonix Nutrition - Regular diet Goals of care - full code   Transfer to floor bed 10/23 >> to FPTS 10/24 and PCCM off.  Chesley Mires, MD [facility] Pulmonary/Critical Care 10/17/2017, 8:59 AM Pager:  [phone] After 3pm call: [phone]

## 2017-10-17 NOTE — Care Management Note (Addendum)
Case Management Note  Patient Details  Name: SOHAN POTVIN MRN: [id number] Date of Birth: 08/08/1961  Subjective/Objective:             Presented with altered mental status from hypoglycemia in setting of ETOH abuse and poor po intake, hx of  Alcohol cirrhosis with esophageal varices, HTN, CKD 3, CHF, anemia. Resides alone.  States was d/c from SNF/GHC < 3 months ago. States independent with ADL's PTA. Owns cane and walker. Pt with 3 admits within the past 6 months.   [name] (Daughter) Jihad Brownlow (Brother) Jae Dire (sister)   [phone]    PCP: Marjie Skiff  Action/Plan: GI following...PT to evaluate .Marland KitchenMarland KitchenMarland KitchenCM following for disposition needs.  Expected Discharge Date:                  Expected Discharge Plan:     In-House Referral:  Clinical Social Work, [facility]  Discharge planning Services  CM Consult  Post Acute Care Choice:    Choice offered to:     DME Arranged:    DME Agency:     HH Arranged:    [place] Agency:     Status of Service:  In process, will continue to follow  If discussed at Long Length of Stay Meetings, dates discussed:    Additional Comments:  Sharin Mons, RN 10/17/2017, 11:22 AM

## 2017-10-18 DIAGNOSIS — E872 Acidosis, unspecified: Secondary | ICD-10-CM

## 2017-10-18 LAB — COMPREHENSIVE METABOLIC PANEL
ALBUMIN: 2 g/dL — AB (ref 3.5–5.0)
ALT: 61 U/L (ref 17–63)
AST: 179 U/L — AB (ref 15–41)
Alkaline Phosphatase: 120 U/L (ref 38–126)
Anion gap: 8 (ref 5–15)
BUN: 10 mg/dL (ref 6–20)
CHLORIDE: 97 mmol/L — AB (ref 101–111)
CO2: 26 mmol/L (ref 22–32)
Calcium: 7.3 mg/dL — ABNORMAL LOW (ref 8.9–10.3)
Creatinine, Ser: 1.54 mg/dL — ABNORMAL HIGH (ref 0.61–1.24)
GFR calc Af Amer: 56 mL/min — ABNORMAL LOW (ref >60)
GFR, EST NON AFRICAN AMERICAN: 48 mL/min — AB (ref >60)
GLUCOSE: 90 mg/dL (ref 65–99)
POTASSIUM: 3.1 mmol/L — AB (ref 3.5–5.1)
SODIUM: 131 mmol/L — AB (ref 135–145)
TOTAL PROTEIN: 6 g/dL — AB (ref 6.5–8.1)
Total Bilirubin: 7.9 mg/dL — ABNORMAL HIGH (ref 0.3–1.2)

## 2017-10-18 LAB — CBC WITH DIFFERENTIAL/PLATELET
BASOS ABS: 0 10*3/uL (ref 0.0–0.1)
Basophils Relative: 1 %
Eosinophils Absolute: 0 10*3/uL (ref 0.0–0.7)
Eosinophils Relative: 2 %
HEMATOCRIT: 21.3 % — AB (ref 39.0–52.0)
Hemoglobin: 7.8 g/dL — ABNORMAL LOW (ref 13.0–17.0)
LYMPHS ABS: 0.5 10*3/uL — AB (ref 0.7–4.0)
Lymphocytes Relative: 36 %
MCH: 33.6 pg (ref 26.0–34.0)
MCHC: 36.6 g/dL — ABNORMAL HIGH (ref 30.0–36.0)
MCV: 91.8 fL (ref 78.0–100.0)
MONO ABS: 0.1 10*3/uL (ref 0.1–1.0)
MONOS PCT: 8 %
NEUTROS ABS: 0.9 10*3/uL — AB (ref 1.7–7.7)
Neutrophils Relative %: 53 %
PLATELETS: 24 10*3/uL — AB (ref 150–400)
RBC: 2.32 MIL/uL — ABNORMAL LOW (ref 4.22–5.81)
RDW: 15.1 % (ref 11.5–15.5)
WBC: 1.5 10*3/uL — ABNORMAL LOW (ref 4.0–10.5)

## 2017-10-18 LAB — PROTIME-INR
INR: 1.88
PROTHROMBIN TIME: 21.4 s — AB (ref 11.4–15.2)

## 2017-10-18 LAB — PHOSPHORUS: PHOSPHORUS: 1.5 mg/dL — AB (ref 2.5–4.6)

## 2017-10-18 LAB — MAGNESIUM: MAGNESIUM: 1 mg/dL — AB (ref 1.7–2.4)

## 2017-10-18 MED ORDER — POTASSIUM CHLORIDE CRYS ER 20 MEQ PO TBCR
40.0000 meq | EXTENDED_RELEASE_TABLET | Freq: Two times a day (BID) | ORAL | Status: AC
  Administered 2017-10-18 (×2): 40 meq via ORAL
  Filled 2017-10-18 (×2): qty 2

## 2017-10-18 MED ORDER — MAGNESIUM CHLORIDE 64 MG PO TBEC
2.0000 | DELAYED_RELEASE_TABLET | Freq: Once | ORAL | Status: AC
  Administered 2017-10-18: 128 mg via ORAL
  Filled 2017-10-18: qty 2

## 2017-10-18 NOTE — Care Management Important Message (Signed)
Important Message  Patient Details  Name: William [name] MRN: [id number] Date of Birth: [birth year]   Medicare Important Message Given:  Yes    Ellison Leisure 10/18/2017, 11:48 AM

## 2017-10-18 NOTE — Evaluation (Signed)
Physical Therapy Evaluation Patient Details Name: William [name] MRN: [id number] DOB: 07/30/1961 Today's Date: 10/18/2017   History of Present Illness  Pt is a 56 y/o male admitted secondary to AMS and acute encephalopathy. Pt found to have an elevated blood alcohol level. PMH including but not limited to alcoholic cirrhosis with active ETOH abuse, CHF, CKD, HTN and recent R humerus fx (06/2017).  Clinical Impression  Pt presented supine in bed with HOB elevated, awake and willing to participate in therapy session. Prior to admission, pt reported that he ambulates with use of SPC when outside of his home and is independent with ADLs. Pt reported that he "mostly" lives alone. Evaluation was limited secondary to pt reporting that he was very tired and only willing to ambulate a short distance within his room. Pt ambulated pushing his IV pole with both hands and close min guard for safety as pt was very unsteady. PT will continue to follow acutely for further mobility progression and assessment. Pt would continue to benefit from skilled physical therapy services at this time while admitted and after d/c to address the below listed limitations in order to improve overall safety and independence with functional mobility.     Follow Up Recommendations Home health PT    Equipment Recommendations  Other (comment) (TBD based on further mobility assessment)    Recommendations for Other Services       Precautions / Restrictions Precautions Precautions: Fall Restrictions Weight Bearing Restrictions: No      Mobility  Bed Mobility Overal bed mobility: Modified Independent                Transfers Overall transfer level: Needs assistance Equipment used: None Transfers: Sit to/from Stand Sit to Stand: Supervision         General transfer comment: for safety  Ambulation/Gait Ambulation/Gait assistance: Min guard Ambulation Distance (Feet): 25 Feet Assistive device:  (pushed IV  pole) Gait Pattern/deviations: Step-through pattern;Decreased stride length;Decreased step length - right;Decreased step length - left Gait velocity: decreased Gait velocity interpretation: Below normal speed for age/gender General Gait Details: pt with modest instability ambulating in room while pushing IV pole; pt only agreeable to ambulate a short distance as he stated that he was tired and did not sleep last night. Pt with no overt LOB or need for physical assistance, but close min guard for safety  Stairs            Wheelchair Mobility    Modified Rankin (Stroke Patients Only)       Balance Overall balance assessment: Needs assistance Sitting-balance support: Feet supported Sitting balance-Leahy Scale: Good     Standing balance support: During functional activity;Single extremity supported Standing balance-Leahy Scale: Poor Standing balance comment: reliant on at least one UE support                             Pertinent Vitals/Pain Pain Assessment: No/denies pain    Home Living Family/patient expects to be discharged to:: Private residence Living Arrangements: Alone Available Help at Discharge: Friend(s);Available PRN/intermittently   Home Access: Level entry     Home Layout: One level Home Equipment: Cane - single point;Other (comment) (in the process of obtaining a shower seat)      Prior Function Level of Independence: Independent with assistive device(s)         Comments: pt reported that he uses a SPC to ambulate outside of his home  Hand Dominance        Extremity/Trunk Assessment   Upper Extremity Assessment Upper Extremity Assessment: Defer to OT evaluation    Lower Extremity Assessment Lower Extremity Assessment: Generalized weakness       Communication   Communication: No difficulties  Cognition Arousal/Alertness: Awake/alert Behavior During Therapy: WFL for tasks assessed/performed Overall Cognitive Status:  Within Functional Limits for tasks assessed                                 General Comments: cognition not formall assessed but WNL for general conversation; pt was alert and following commands appropriately throughout      General Comments      Exercises     Assessment/Plan    PT Assessment Patient needs continued PT services  PT Problem List Decreased strength;Decreased activity tolerance;Decreased balance;Decreased mobility;Decreased coordination;Decreased knowledge of use of DME;Decreased safety awareness;Decreased knowledge of precautions       PT Treatment Interventions DME instruction;Gait training;Stair training;Functional mobility training;Therapeutic activities;Therapeutic exercise;Balance training;Neuromuscular re-education;Patient/family education    PT Goals (Current goals can be found in the Care Plan section)  Acute Rehab PT Goals Patient Stated Goal: return home PT Goal Formulation: With patient Time For Goal Achievement: 11/01/17 Potential to Achieve Goals: Good    Frequency Min 3X/week   Barriers to discharge Decreased caregiver support      Co-evaluation               AM-PAC PT "6 Clicks" Daily Activity  Outcome Measure Difficulty turning over in bed (including adjusting bedclothes, sheets and blankets)?: None Difficulty moving from lying on back to sitting on the side of the bed? : None Difficulty sitting down on and standing up from a chair with arms (e.g., wheelchair, bedside commode, etc,.)?: A Little Help needed moving to and from a bed to chair (including a wheelchair)?: A Little Help needed walking in hospital room?: A Little Help needed climbing 3-5 steps with a railing? : A Lot 6 Click Score: 19    End of Session   Activity Tolerance: Patient limited by fatigue Patient left: in bed;with call bell/phone within reach Nurse Communication: Mobility status PT Visit Diagnosis: Unsteadiness on feet (R26.81);Other  abnormalities of gait and mobility (R26.89)    Time: 1030-1040 PT Time Calculation (min) (ACUTE ONLY): 10 min   Charges:   PT Evaluation $PT Eval Moderate Complexity: 1 Mod     PT G Codes:        Pisek, PT, DPT [place] 10/18/2017, 11:27 AM

## 2017-10-18 NOTE — Progress Notes (Signed)
Family Medicine Teaching Service Daily Progress Note Intern Pager: [phone]  Patient name: William [name] Medical record number: [id number] Date of birth: Sep 02, 1961 Age: 56 y.o. Gender: male  Primary Care Provider: Marjie Skiff, MD Consultants: GI, CCM Code Status: Full  Pt Overview and Major Events to Date:  10/20 Admitted to ED  10/22 GI consulted 10/23 off pressors and precedex 10/24 transfer to teaching service  Assessment and Plan: Patient is 56 yo male with a past medical history significant for alcohol cirrhosis with esophageal varices, HTN, CKD 3, CHF, anemia who   presented with altered mental status from hypoglycemia in setting of ETOH abuse and poor po intake.  #Alcoholic cirrhosis with hx of varices, splenomegaly. Initially presented with alcohol level of 166 and altered mental status found to be hypoglycemic and acidotic. Patient received bicarb while in the MICU. Patient initially presented with transaminitis, AST of 291 and ALT of 82. Ammonia level was 113. Patient was started on lactulose. Gastroenterology has been following patient. AST and ALT have trended down. Patient can have regular bowel movement while lactulose. Will continue to monitor. --Follow up on GI consult, appreciate  recommendations --Follow-up on a.m. CMP  #Sepsis, resolved Patient initially presented with altered mental status, tachycardia, and hyperlipoidemia concerning for sepsis. Patient was initially started on vancomycin and Zosyn. Vancomycin was stopped on 10/23. Blood and urine cultures have shown no growth to date. Patient have been afebrile with normal vitals and no concern for infectious process. Patient is mentating at baseline. --Will discontinue Zosyn Zosyn --Monitor fever curve --Follow-up on a.m. CBC  #Acute renal failure with ATN   baseline creatinine 1.17 from 07/04/17. Patient initially presented with severe Metabolic acidosis with osmolar gap of 17 she was also hyperkalemic  with hyponatremia in the setting of both her intake and alcohol abuse. Lactic acidosis has resolved after receiving bicarbonate IV fluid. --We will continue D5 normal saline at 50 ml/hr and anticipate discontinuing tomorrow 10/25 --Replace electrolytes as needed --Continue to hold hold outpt lasix  #Severe protein calorie malnutrition. Likely secondary to poor by mouth intake given history of alcohol abuse. Patient is currently eating with improved appetite. Will supplement as needed. --Continue thiamine and folic acid  #Acute metabolic encephalopathy, resolved Patient has been on lactulose 10 mg twice a day for the past few days endorses regular bowel movement. Patient mental status has returned to baseline. To discuss continuation of lactulose with GI given improvement in mental status. --Continue lactulose to bid 10/23 --Continue thiamine, folic acid  #Neuropathy - hold outpt neurontin  #Anemia and thrombocytopenia Patient has chronic anemia and thrombocytopenia secondary to failure setting of alcohol abuse. --Follow-up on a.m. CBC   #Hyperglycemia, improved. Blood glucose have been well controlled since admission. Patient has not required sliding scale insulin. Patient is nondiabetic. Will continue to monitor CBGs.  #Deconditioning --Follow-up PT recommendations  FEN/GI: Heart healthy, SCD PPx: Protonix  Disposition: Home vs SNF pending PT/OT evaluation  Subjective:  Patient is feeling better this morning. No acute complaint. Has good appetite. Reports BMs. Discuss alcohol cessation in the setting of multiple hospitalization. Patient is ready to make changes to his lifestyle.  Objective: Temp:  [98.6 F (37 C)-99.2 F (37.3 C)] 99.2 F (37.3 C) (10/24 0542) Pulse Rate:  [85-100] 92 (10/24 0542) Resp:  [16-19] 18 (10/24 0542) BP: (113-126)/(69-79) 121/69 (10/24 0542) SpO2:  [96 %-99 %] 97 % (10/24 0542) Weight:  [200 lb 3.2 oz (90.8 kg)-211 lb 14.4 oz (96.1 kg)]  200 lb 3.2  oz (90.8 kg) (10/24 0542) Physical Exam:  General: NAD, pleasant, able to participate in exam Cardiac: RRR, normal heart sounds, no murmurs. 2+ radial and PT pulses bilaterally Respiratory: CTAB, normal effort, No wheezes, rales or rhonchi Abdomen: soft, nontender, nondistended, no hepatic or splenomegaly, +BS Extremities: no edema or cyanosis. WWP. Skin: warm and dry, no rashes noted Neuro: alert and oriented x4, no focal deficits Psych: Normal affect and mood   Laboratory:  Recent Labs Lab 10/16/17 0338 10/17/17 0505 10/17/17 0915  WBC 2.4* 1.6* 1.9*  HGB 7.9* 7.5* 8.1*  HCT 21.8* 20.6* 22.3*  PLT 32* 28* 24*    Recent Labs Lab 10/16/17 0338 10/17/17 0505 10/18/17 0529  NA 133* 131* 131*  K 2.8* 3.1* 3.1*  CL 93* 94* 97*  CO2 32 28 26  BUN 16 10 10   CREATININE 2.23* 1.56* 1.54*  CALCIUM 7.3* 7.1* 7.3*  PROT 6.4* 6.1* 6.0*  BILITOT 6.1* 7.0* 7.9*  ALKPHOS 119 111 120  ALT 93* 82* 61  AST 374* 280* 179*  GLUCOSE 68 108* 90    Imaging/Diagnostic Tests: Ct Abdomen Pelvis Wo Contrast  Result Date: 10/16/2017 CLINICAL DATA:  Elevated lactic acid with hepatic and renal dysfunction. Abdominal distention. Assess for bowel ischemia. Ultrasound abdomen with portal vein partial thrombosis. EXAM: CT ABDOMEN AND PELVIS WITHOUT CONTRAST TECHNIQUE: Multidetector CT imaging of the abdomen and pelvis was performed following the standard protocol without IV contrast. COMPARISON:  06/27/2017 FINDINGS: Lower chest: Mild cardiomegaly without pericardial effusion. There is aortic atherosclerosis with coronary arteriosclerosis. Bilateral pleural effusions are noted, trace on the right and small on the left with adjacent atelectatic change. Hepatobiliary: Cirrhosis of the liver with surface nodularity. No biliary dilatation or portal venous gas. Distention of the gallbladder without mural thickening. Layering calculi are noted near the neck of the gallbladder. No  choledocholithiasis. Pancreas: No ductal dilatation or space-occupying mass. Spleen: Mild splenomegaly with the spleen measuring up to 17 cm AP. Adrenals/Urinary Tract: Normal bilateral adrenal glands and unenhanced kidneys. Decompressed appearance of the ureters. Foley decompression of the bladder. Stomach/Bowel: Decompressed stomach with normal small bowel rotation. No bowel obstruction. No pneumatosis. The appendix is unremarkable. No large bowel dilatation. There is scattered colonic diverticulosis without acute diverticulitis. Vascular/Lymphatic: Moderate aortoiliac atherosclerosis without aneurysm. No lymphadenopathy. Reproductive: Unremarkable prostate and seminal vesicles. Other: Small volume of ascites noted in the paracolic gutters adjacent to the right hepatic lobe. Trace fluid in the pelvis. Musculoskeletal: Chronic stable mild superior endplate compression of L2 with degenerative disc disease L5-S1. Bilateral gynecomastia. IMPRESSION: 1. Mild cardiomegaly with coronary arteriosclerosis and trace right and small left pleural effusions. 2. Sclerotic appearance of the liver with stable splenomegaly and small volume of ascites. 3. No evidence of bowel obstruction nor ischemia. 4. Moderate aortoiliac atherosclerosis without aneurysm. 5. Chronic stable superior endplate compression of L2 with degenerative disc disease at L5-S1. Electronically Signed   By: Ashley Royalty M.D.   On: 10/16/2017 00:22   US Abdomen Complete  Result Date: 10/14/2017 CLINICAL DATA:  Right upper quadrant pain. History of splenomegaly, hypertension, prostate cancer, hepatic cirrhosis, cholelithiasis. EXAM: ABDOMEN ULTRASOUND COMPLETE COMPARISON:  CT abdomen and pelvis 06/27/2017 FINDINGS: Gallbladder: Gallbladder is distended. Multiple small stones in the gallbladder layering along the dependent surface. No gallbladder wall thickening or edema. Murphy's sign is negative. Common bile duct: Diameter: 6.7 mm, normal Liver: Changes of  hepatic cirrhosis with diffuse heterogeneous parenchymal echotexture and nodular contour. Free fluid is demonstrated around the liver edge consistent with ascites.  No focal lesions are identified but poor penetration limits evaluation. Color flow Doppler imaging of the main portal vein shows no flow in the mid and distal portal vein with segmental flow demonstrated in the proximal portal vein demonstrated flow reversal. IVC: No abnormality visualized. Pancreas: Visualized portion unremarkable. Spleen: Spleen is enlarged with spleen length measuring 13.5 cm. Calculated volume is 774 mL. Right Kidney: Length: 11.1 cm. Echogenicity within normal limits. No mass or hydronephrosis visualized. Left Kidney: Length: 11.9 cm. Visualization is limited due to poor penetration. Echogenicity appears within normal limits. No mass or hydronephrosis visualized. Abdominal aorta: No aneurysm visualized. Other findings: Examination is somewhat technically limited due to patient's limited mobility, body habitus, and bowel gas. IMPRESSION: 1. Cholelithiasis with distended gallbladder. No additional changes to suggest cholecystitis. 2. Changes of hepatic cirrhosis with portal venous hypertension. 3. Partial portal vein thrombosis with reversal of flow in the proximal portal vein. 4. Splenic enlargement. Electronically Signed   By: Lucienne Capers M.D.   On: 10/14/2017 22:28    Marjie Skiff, MD 10/18/2017, 8:00 AM PGY-2, [place] Intern pager: [phone], text pages welcome

## 2017-10-18 NOTE — Progress Notes (Signed)
[facility] Gastroenterology Progress Note  William [name] 56 y.o. [birth year]  CC:  alcoholic hepatitis/abnormal LFT   Subjective: No new acute symptoms. Feeling better. Denied abdominal pain, nausea or vomiting. Having 2-3 bowel movements while on lactulose.  ROS : Denied chest pain. Shortness of breath resolved.   Objective: Vital signs in last 24 hours: Vitals:   10/18/17 0033 10/18/17 0542  BP: 122/79 121/69  Pulse: 92 92  Resp: 18 18  Temp: 99.1 F (37.3 C) 99.2 F (37.3 C)  SpO2: 98% 97%    Physical Exam:  General:  Alert, cooperative, no distress, appears stated age  Head:  Normocephalic, without obvious abnormality, atraumatic  Eyes:  , EOM's intact, scleral icterus noted   Lungs:   Decreased breath sounds bilaterally. Anterior exam only., respirations unlabored  Heart:  Regular rate and rhythm, S1, S2 normal  Abdomen:   Soft, non-tender, bowel sounds active all four quadrants,  no masses,   Extremities: Extremities normal, no  edema       Lab Results:  Recent Labs  10/17/17 0505 10/18/17 0529  NA 131* 131*  K 3.1* 3.1*  CL 94* 97*  CO2 28 26  GLUCOSE 108* 90  BUN 10 10  CREATININE 1.56* 1.54*  CALCIUM 7.1* 7.3*  MG 1.2* 1.0*  PHOS 1.5* 1.5*    Recent Labs  10/17/17 0505 10/18/17 0529  AST 280* 179*  ALT 82* 61  ALKPHOS 111 120  BILITOT 7.0* 7.9*  PROT 6.1* 6.0*  ALBUMIN 2.2* 2.0*    Recent Labs  10/16/17 0338 10/17/17 0505 10/17/17 0915  WBC 2.4* 1.6* 1.9*  NEUTROABS 1.6*  --  1.0*  HGB 7.9* 7.5* 8.1*  HCT 21.8* 20.6* 22.3*  MCV 91.6 91.6 91.4  PLT 32* 28* 24*    Recent Labs  10/18/17 0529  LABPROT 21.4*  INR 1.88      Assessment/Plan: - Alcoholic hepatitis with a discriminant function score of 36.4 as of today  - 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 - History of encephalopathy. Currently alert and oriented 3 - Kidney insufficiency. Improving -  Pancytopenia  Recommendations ------------------------- - Patient's December function score has improved from 42 on admission to 36.4 as of today. -  Continue supportive care for now. Not a candidate for prednisone and Trental at this time. - Continue lactulose as titrated to have 2-3 bowel movements per day. - Avoid alcohol. Avoid NSAIDs. - Hopefully discharge soon. - GI will follow   Otis Brace MD, [place] 10/18/2017, 8:17 AM  Pager [phone]  If no answer or after 5 PM call [phone]

## 2017-10-19 ENCOUNTER — Encounter (HOSPITAL_COMMUNITY): Payer: Self-pay | Admitting: Orthopaedic Surgery

## 2017-10-19 DIAGNOSIS — E872 Acidosis, unspecified: Secondary | ICD-10-CM

## 2017-10-19 DIAGNOSIS — K7030 Alcoholic cirrhosis of liver without ascites: Secondary | ICD-10-CM

## 2017-10-19 LAB — CULTURE, BLOOD (ROUTINE X 2)
Culture: NO GROWTH
Culture: NO GROWTH
SPECIAL REQUESTS: ADEQUATE
Special Requests: ADEQUATE

## 2017-10-19 LAB — COMPREHENSIVE METABOLIC PANEL
ALT: 51 U/L (ref 17–63)
AST: 129 U/L — ABNORMAL HIGH (ref 15–41)
Albumin: 2 g/dL — ABNORMAL LOW (ref 3.5–5.0)
Alkaline Phosphatase: 126 U/L (ref 38–126)
Anion gap: 8 (ref 5–15)
BILIRUBIN TOTAL: 7 mg/dL — AB (ref 0.3–1.2)
BUN: 11 mg/dL (ref 6–20)
CHLORIDE: 98 mmol/L — AB (ref 101–111)
CO2: 26 mmol/L (ref 22–32)
CREATININE: 1.42 mg/dL — AB (ref 0.61–1.24)
Calcium: 7.5 mg/dL — ABNORMAL LOW (ref 8.9–10.3)
GFR calc non Af Amer: 53 mL/min — ABNORMAL LOW (ref >60)
Glucose, Bld: 92 mg/dL (ref 65–99)
POTASSIUM: 3.9 mmol/L (ref 3.5–5.1)
Sodium: 132 mmol/L — ABNORMAL LOW (ref 135–145)
TOTAL PROTEIN: 5.8 g/dL — AB (ref 6.5–8.1)

## 2017-10-19 LAB — CBC
HEMATOCRIT: 20.9 % — AB (ref 39.0–52.0)
HEMOGLOBIN: 7.5 g/dL — AB (ref 13.0–17.0)
MCH: 33 pg (ref 26.0–34.0)
MCHC: 35.9 g/dL (ref 30.0–36.0)
MCV: 92.1 fL (ref 78.0–100.0)
Platelets: 29 10*3/uL — CL (ref 150–400)
RBC: 2.27 MIL/uL — ABNORMAL LOW (ref 4.22–5.81)
RDW: 15.3 % (ref 11.5–15.5)
WBC: 1.5 10*3/uL — ABNORMAL LOW (ref 4.0–10.5)

## 2017-10-19 LAB — PATHOLOGIST SMEAR REVIEW

## 2017-10-19 LAB — MAGNESIUM: Magnesium: 0.9 mg/dL — CL (ref 1.7–2.4)

## 2017-10-19 LAB — SODIUM, URINE, RANDOM: Sodium, Ur: 96 mmol/L

## 2017-10-19 LAB — PHOSPHORUS: Phosphorus: 1.7 mg/dL — ABNORMAL LOW (ref 2.5–4.6)

## 2017-10-19 MED ORDER — ADULT MULTIVITAMIN W/MINERALS CH: 1.0000 | ORAL_TABLET | Freq: Every day | ORAL | 0 refills | Status: AC

## 2017-10-19 MED ORDER — SODIUM PHOSPHATES 45 MMOLE/15ML IV SOLN
30.0000 mmol | Freq: Once | INTRAVENOUS | Status: AC
  Administered 2017-10-19: 30 mmol via INTRAVENOUS
  Filled 2017-10-19: qty 10

## 2017-10-19 MED ORDER — ADULT MULTIVITAMIN W/MINERALS CH
1.0000 | ORAL_TABLET | Freq: Every day | ORAL | Status: DC
  Administered 2017-10-19 – 2017-10-20 (×2): 1 via ORAL
  Filled 2017-10-19 (×2): qty 1

## 2017-10-19 MED ORDER — MAGNESIUM SULFATE 2 GM/50ML IV SOLN
2.0000 g | INTRAVENOUS | Status: AC
  Administered 2017-10-19 (×2): 2 g via INTRAVENOUS
  Filled 2017-10-19 (×2): qty 50

## 2017-10-19 MED ORDER — LACTULOSE 10 GM/15ML PO SOLN: 20.0000 g | Freq: Two times a day (BID) | ORAL | 0 refills | Status: DC

## 2017-10-19 NOTE — Progress Notes (Signed)
Initial Nutrition Assessment  DOCUMENTATION CODES:   Not applicable  INTERVENTION:   -MVI daily  NUTRITION DIAGNOSIS:   Increased nutrient needs related to chronic illness (alcoholic hepatitis/cirrhosis) as evidenced by estimated needs.  GOAL:   Patient will meet greater than or equal to 90% of their needs  MONITOR:   PO intake, Supplement acceptance, Labs, Weight trends, Skin, I & O's  REASON FOR ASSESSMENT:   Consult Assessment of nutrition requirement/status  ASSESSMENT:   56 year old male with unknown alcoholic cirrhosis, presents lethargic with ETOH 116, glucose 15, ammonia 113, LA > 17.   Pt admitted with alcoholic hepatitis and cirrhosis.   Spoke with pt, who reports fair to good appetite. He reports he has been consuming at least 50% of meals since hospitalization. PTA, he was consuming 2 meals per day (Breakfast: eggs, pancakes, bacon, Dinner: meat, starch, and vegetable). Pt reports that he consumes mainly water, but spoke openly about alcohol consumption. He reports pt consumes approximately two fifths of gin throughout the week (approximately 1.5 L), which he mixes with gingerale.   Pt denies any weight loss; reports UBW of 180#.   Nutrition-Focused physical exam completed. Findings are no fat depletion, no muscle depletion, and no edema.   Pt complains of peeling and dryness of skin on the palms of hands and tips of fingers (this could indicate essential fatty acid deficiency or increased/decreased vitamin A). Pt also appeared to have mild yellow tinted eyes (?jaundice).   Discussed with pt importance of good meal intake to promote healing.   Labs reviewed: Na: 132, Phos: 1.7, Mg: 0.9.  Diet Order:  Diet regular Room service appropriate? Yes; Fluid consistency: Thin  Skin:  Reviewed, no issues  Last BM:  10/18/17  Height:   Ht Readings from Last 1 Encounters:  10/17/17 5\' 11"  (1.803 m)    Weight:   Wt Readings from Last 1 Encounters:  10/19/17  202 lb 9.6 oz (91.9 kg)    Ideal Body Weight:  78.2 kg  BMI:  Body mass index is 28.26 kg/m.  Estimated Nutritional Needs:   Kcal:  2200-2300  Protein:  115-130 grams  Fluid:  2.2-2.4 L  EDUCATION NEEDS:   Education needs addressed  William [name] A. William [name], RD, LDN, CDE Pager: [phone] After hours Pager: [phone]

## 2017-10-19 NOTE — Progress Notes (Signed)
Family Medicine Teaching Service Daily Progress Note Intern Pager: [phone]  Patient name: William [name] Medical record number: [id number] Date of birth: [birth year] Age: 56 y.o. Gender: male  Primary Care Provider: Marjie Skiff, MD Consultants: GI, CCM Code Status: Full  Pt Overview and Major Events to Date:  10/20 Admitted to ED  10/22 GI consulted 10/23 off pressors and precedex 10/24 transfer to teaching service  Assessment and Plan: Patient is 56 yo male with a past medical history significant for alcohol cirrhosis with esophageal varices, HTN, CKD 3, CHF, anemia who   presented with altered mental status from hypoglycemia in setting of ETOH abuse and poor po intake.  Altered mental status: Resolved. AAO 4. Multifactorial. Patient with alcoholic cirrhosis. Ammonia elevated to 113 on admission. Also with alcohol level of 166,  Hypoglycemia and severe acidosis. Also with transaminases and gastroenterology was consulted while in MICU.  -Manage alcoholic cirrhosis as below -Continue lactulose -Home health PT per PT recommendation  Alcoholic cirrhosis with hx of varices, splenomegaly: No overt ascites on exam. MELD-Na-27 for injecting to 90 day mortality of 27-32% -Follow GI recommendations -Supportive care.   Concern for sepsis: No identifiable source of infection. Cultures negative to date. Initially started on vancomycin and Zosyn. Vancomycin was stopped on 10/23. Zosyn was stopped on 10/24. No further fevers since the day of admission. Altered mental status resolved -Continue to monitor  Acute renal failure with ATN after prolonged prerenal insult: Serum creatinine 1.42; 2.33 on admission. Baseline~1.2-1.3.  -KVO fluids -Hold nephrotoxic medications  Hypokalemia: Resolved -Replete as needed  Hypomagnesemia: Mg 1.0 yesterday. Repleted -Add Mg to morning lab -Replete as needed  Severe protein calorie malnutrition. -Continue thiamine and folic acid -Nutrition  consult  Neuropathy -Can resume home gabapentin  Pancytopenia: Likely due to underlying cirrhosis and splenomegaly. WBC 1.5. Hgb 7.5. Platelet 29. There could be some element of hemodilution as well. -trend CBC  -DC IV fluids  #Hyperglycemia, improved. Blood glucose have been well controlled since admission. Patient has not required sliding scale insulin. Patient is nondiabetic. Will continue to monitor CBGs.  FEN/GI: Heart healthy, SCD PPx: Protonix  Disposition: Discharged home with home PT  Subjective:  No acute event overnight. Patient has no complaints this morning. Reports bowel movement yesterday. He is asking to go home.   Objective: Temp:  [98.4 F (36.9 C)-98.7 F (37.1 C)] 98.4 F (36.9 C) (10/25 0629) Pulse Rate:  [87-90] 87 (10/25 0629) Resp:  [16-18] 16 (10/25 0629) BP: (115-131)/(73-89) 115/73 (10/25 0629) SpO2:  [100 %] 100 % (10/25 0629) Weight:  [202 lb 9.6 oz (91.9 kg)] 202 lb 9.6 oz (91.9 kg) (10/25 8756) Physical Exam:  GEN: Lying in bed, able to sit up in bed for exam, no apparent distress. Head: normocephalic and atraumatic  Eyes: conjunctiva pale, sclera icteric CVS: RRR, nl s1 & s2, no murmurs, no edema RESP: no IWOB, good air movement bilaterally, CTAB GI: BS present & normal, soft, ?mild ascites.  MSK: no focal tenderness or notable swelling SKIN: Jaundice down to his waist NEURO: alert and oiented appropriately, no gross deficits  PSYCH: euthymic mood with congruent affect  Laboratory:  Recent Labs Lab 10/17/17 0915 10/18/17 0801 10/19/17 0426  WBC 1.9* 1.5* 1.5*  HGB 8.1* 7.8* 7.5*  HCT 22.3* 21.3* 20.9*  PLT 24* 24* 29*    Recent Labs Lab 10/17/17 0505 10/18/17 0529 10/19/17 0426  NA 131* 131* 132*  K 3.1* 3.1* 3.9  CL 94* 97* 98*  CO2 28 26  26  BUN 10 10 11   CREATININE 1.56* 1.54* 1.42*  CALCIUM 7.1* 7.3* 7.5*  PROT 6.1* 6.0* 5.8*  BILITOT 7.0* 7.9* 7.0*  ALKPHOS 111 120 126  ALT 82* 61 51  AST 280* 179* 129*   GLUCOSE 108* 90 92    Imaging/Diagnostic Tests: Ct Abdomen Pelvis Wo Contrast  Result Date: 10/16/2017 CLINICAL DATA:  Elevated lactic acid with hepatic and renal dysfunction. Abdominal distention. Assess for bowel ischemia. Ultrasound abdomen with portal vein partial thrombosis. EXAM: CT ABDOMEN AND PELVIS WITHOUT CONTRAST TECHNIQUE: Multidetector CT imaging of the abdomen and pelvis was performed following the standard protocol without IV contrast. COMPARISON:  06/27/2017 FINDINGS: Lower chest: Mild cardiomegaly without pericardial effusion. There is aortic atherosclerosis with coronary arteriosclerosis. Bilateral pleural effusions are noted, trace on the right and small on the left with adjacent atelectatic change. Hepatobiliary: Cirrhosis of the liver with surface nodularity. No biliary dilatation or portal venous gas. Distention of the gallbladder without mural thickening. Layering calculi are noted near the neck of the gallbladder. No choledocholithiasis. Pancreas: No ductal dilatation or space-occupying mass. Spleen: Mild splenomegaly with the spleen measuring up to 17 cm AP. Adrenals/Urinary Tract: Normal bilateral adrenal glands and unenhanced kidneys. Decompressed appearance of the ureters. Foley decompression of the bladder. Stomach/Bowel: Decompressed stomach with normal small bowel rotation. No bowel obstruction. No pneumatosis. The appendix is unremarkable. No large bowel dilatation. There is scattered colonic diverticulosis without acute diverticulitis. Vascular/Lymphatic: Moderate aortoiliac atherosclerosis without aneurysm. No lymphadenopathy. Reproductive: Unremarkable prostate and seminal vesicles. Other: Small volume of ascites noted in the paracolic gutters adjacent to the right hepatic lobe. Trace fluid in the pelvis. Musculoskeletal: Chronic stable mild superior endplate compression of L2 with degenerative disc disease L5-S1. Bilateral gynecomastia. IMPRESSION: 1. Mild cardiomegaly  with coronary arteriosclerosis and trace right and small left pleural effusions. 2. Sclerotic appearance of the liver with stable splenomegaly and small volume of ascites. 3. No evidence of bowel obstruction nor ischemia. 4. Moderate aortoiliac atherosclerosis without aneurysm. 5. Chronic stable superior endplate compression of L2 with degenerative disc disease at L5-S1. Electronically Signed   By: Ashley Royalty M.D.   On: 10/16/2017 00:22    Mercy Riding, MD 10/19/2017, 7:14 AM PGY-3, [place] Intern pager: [phone], text pages welcome

## 2017-10-19 NOTE — Progress Notes (Signed)
Internal Medicine paged Magnesium per lab 0.9

## 2017-10-19 NOTE — Progress Notes (Signed)
Physical Therapy Treatment Patient Details Name: William [name] MRN: [id number] DOB: 09/18/1961 Today's Date: 10/19/2017    History of Present Illness Pt is a 56 y/o male admitted secondary to AMS and acute encephalopathy. Pt found to have an elevated blood alcohol level. PMH including but not limited to alcoholic cirrhosis with active ETOH abuse, CHF, CKD, HTN and recent R humerus fx (06/2017).    PT Comments    Pt making good progress towards achieving his current functional mobility goals. PT will continue to follow acutely to ensure a safe d/c home.   Follow Up Recommendations  No PT follow up     Equipment Recommendations  None recommended by PT    Recommendations for Other Services       Precautions / Restrictions Precautions Precautions: Fall Restrictions Weight Bearing Restrictions: No    Mobility  Bed Mobility Overal bed mobility: Modified Independent                Transfers Overall transfer level: Modified independent Equipment used: None                Ambulation/Gait Ambulation/Gait assistance: Supervision Ambulation Distance (Feet): 150 Feet (150' x2 with standing rest break) Assistive device: None Gait Pattern/deviations: Step-through pattern;Decreased stride length Gait velocity: decreased Gait velocity interpretation: Below normal speed for age/gender General Gait Details: pt with mild instability but no overt LOB or need for physical assistance, supervision for safety. pt required one standing rest break secondary to bilateral knee pain (chronic)   Stairs            Wheelchair Mobility    Modified Rankin (Stroke Patients Only)       Balance Overall balance assessment: Needs assistance Sitting-balance support: Feet supported Sitting balance-Leahy Scale: Good     Standing balance support: During functional activity;No upper extremity supported Standing balance-Leahy Scale: Fair                              Cognition Arousal/Alertness: Awake/alert Behavior During Therapy: WFL for tasks assessed/performed Overall Cognitive Status: Within Functional Limits for tasks assessed                                        Exercises      General Comments        Pertinent Vitals/Pain Pain Assessment: No/denies pain    Home Living                      Prior Function            PT Goals (current goals can now be found in the care plan section) Acute Rehab PT Goals PT Goal Formulation: With patient Time For Goal Achievement: 11/01/17 Potential to Achieve Goals: Good Progress towards PT goals: Progressing toward goals    Frequency    Min 3X/week      PT Plan Current plan remains appropriate;Discharge plan needs to be updated    Co-evaluation              AM-PAC PT "6 Clicks" Daily Activity  Outcome Measure  Difficulty turning over in bed (including adjusting bedclothes, sheets and blankets)?: None Difficulty moving from lying on back to sitting on the side of the bed? : None Difficulty sitting down on and standing up from a chair with arms (e.g., wheelchair,  bedside commode, etc,.)?: None Help needed moving to and from a bed to chair (including a wheelchair)?: None Help needed walking in hospital room?: None Help needed climbing 3-5 steps with a railing? : A Little 6 Click Score: 23    End of Session Equipment Utilized During Treatment: Gait belt Activity Tolerance: Patient tolerated treatment well Patient left: in bed;with call bell/phone within reach;with bed alarm set Nurse Communication: Mobility status PT Visit Diagnosis: Unsteadiness on feet (R26.81);Other abnormalities of gait and mobility (R26.89)     Time: 3833-3832 PT Time Calculation (min) (ACUTE ONLY): 15 min  Charges:  $Gait Training: 8-22 mins                    G Codes:       Drew, [place], [place] 10/19/2017, 12:22 PM

## 2017-10-19 NOTE — Progress Notes (Signed)
[facility] Gastroenterology Progress Note  William [name] 56 y.o. [birth year]  CC:  alcoholic hepatitis/abnormal LFT   Subjective: No new acute symptoms. Feeling better.  Wants to go home Wants to go home  ROS : Denied chest pain and shortness of breath   Objective: Vital signs in last 24 hours: Vitals:   10/19/17 0054 10/19/17 0629  BP: 123/78 115/73  Pulse: 89 87  Resp:  16  Temp:  98.4 F (36.9 C)  SpO2:  100%    Physical Exam:  General:  Alert, cooperative, no distress, appears stated age  Head:  Normocephalic, without obvious abnormality, atraumatic  Eyes:  , EOM's intact, scleral icterus noted   Lungs:   Decreased breath sounds bilaterally. Anterior exam only., respirations unlabored  Heart:  Regular rate and rhythm, S1, S2 normal  Abdomen:   Soft, non-tender, bowel sounds active all four quadrants,  no masses,   Extremities: Extremities normal, no  edema       Lab Results:  Recent Labs  10/17/17 0505 10/18/17 0529 10/19/17 0426 10/19/17 0826  NA 131* 131* 132*  --   K 3.1* 3.1* 3.9  --   CL 94* 97* 98*  --   CO2 28 26 26   --   GLUCOSE 108* 90 92  --   BUN 10 10 11   --   CREATININE 1.56* 1.54* 1.42*  --   CALCIUM 7.1* 7.3* 7.5*  --   MG 1.2* 1.0*  --  0.9*  PHOS 1.5* 1.5*  --   --     Recent Labs  10/18/17 0529 10/19/17 0426  AST 179* 129*  ALT 61 51  ALKPHOS 120 126  BILITOT 7.9* 7.0*  PROT 6.0* 5.8*  ALBUMIN 2.0* 2.0*    Recent Labs  10/17/17 0915 10/18/17 0801 10/19/17 0426  WBC 1.9* 1.5* 1.5*  NEUTROABS 1.0* 0.9*  --   HGB 8.1* 7.8* 7.5*  HCT 22.3* 21.3* 20.9*  MCV 91.4 91.8 92.1  PLT 24* 24* 29*    Recent Labs  10/18/17 0529  LABPROT 21.4*  INR 1.88      Assessment/Plan: - Alcoholic hepatitis with a discriminant function score of 36.4 as of today  - 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 - History of encephalopathy. Currently alert and oriented 3 - Kidney insufficiency. Improving -  Pancytopenia  Recommendations ------------------------- - Patient doing better. - Continue lactulose as titrated to have 2-3 bowel movements per day. - Avoid alcohol. Avoid NSAIDs. - Okay to discharge home from GI standpoint. Follow-up in GI clinic in 4 weeks after discharge. Recommend monitoring CMP/INR by PCP in 1 to 2  Weeks. GI will sign off. Call us back if needed.    Otis Brace MD, FACP 10/19/2017, 9:44 AM  Pager [phone]  If no answer or after 5 PM call [phone]

## 2017-10-19 NOTE — Discharge Summary (Signed)
[facility] Discharge Summary  Patient name: William [name] Medical record number: [id number] Date of birth: 05-Jun-1961 Age: 56 y.o. Gender: male Date of Admission: 10/14/2017  Date of Discharge: 10/20/2017 Admitting Physician: Rigoberto Noel, MD  Primary Care Provider: Marjie Skiff, MD Consultants: GI, [facility] care management  Indication for Hospitalization: Altered mental status hypoglycemia Metabolic acidois  Discharge Diagnoses/Problem List:  Altered mental status Alcoholic cirhosis Acute kidney injury Hypokalemia Hypomagnesemia Severe protein calorie malnutrition Neuropathy Pancytopenia Hyperglycemia  Disposition: home  Discharge Condition: good  Discharge Exam:  GEN: Lying in bed, able to sit up in bed for exam, no apparent distress. Head: normocephalic and atraumatic  Eyes: conjunctiva pale, sclera icteric CVS: RRR, nl s1 & s2, no murmurs, no edema RESP: no IWOB, good air movement bilaterally, CTAB GI: BS present & normal, soft, ?mild ascites.  MSK: no focal tenderness or notable swelling SKIN: Jaundice down to his waist NEURO: alert and oiented appropriately, no gross deficits  PSYCH: euthymic mood with congruent affect  Brief Hospital Course:  56 year old presented on 10/20 with altered mental status, hypoglycemia, etoh intoxication, and severe anion gap metabolic acidosis. Patient was given 3-4 L IV fluids in the ED. He was started on a bicarb drip due to ph of 7.0 and bicarb of 5. He received 2mg  of lorazapam in the ed. He was started on vanc and zosyn at that time. He underwent and echo which showed EF 65-70%. An ultrasound of his abdomen was also performed which showed portal venous hypertension, cholelithiasis, splenic enlargement. By 10/21 his gap had closed and his ph was 7.48.  Altered mental status By 10/21 his altered mental status had resolved. With an ammonia level of 113 on admission and etoh level of 166 at admission  his ams was likely multifactorial. He was started on lactulose with the goal of 2-3 BMs per day. Hypoglycemia and severe AG metabolic acidosis likely played a role as well. By 10/23 when the patient came out of the ICU he was able to do all ADLs. He was aox4, with no difficulties at time of discharge on 10/26.  Acute Kidney Injury Patient with admission creatinine of 2.33. Most likely pre-renal in setting of poor po intake. Resolved with fluid hydration and increased po intake. He was discharged with a creatinine of 1.35. This was felt to be at his baseline of ~1.3. He has a cmp to be drawn at clinic follow up on 10/29.  Hypomagnesemia Patient had a magnesium of 0.9 on 10/20 at admission. He was repleted with a combination of iv magnesium and oral magnesium (slowmag). His magnesium had risen to 1.4 on 10/26. He has a magnesium to check at clinic f/u on 10/29. He was discharged with slowmag. This can be stopped if mg w/n/l on 10/29.  Hyperphosphatemia/hypophosphatemia Patient with phosphate of 5.2 on 10/20. This eventually trended down until it reached 1.7 on 10/25. This downtrend is consistent with phosphate depletion when refeeding. He was repleted on 10/26. Phos at dc was 3.5. He has a follow up phosphorus lab to be drawn at dc 10/29.  Alcoholic cirrhosis Patient with elevated LFTs during admission consistent with alcoholic cirrhosis. Also had elevated ammonia which likely contributed some to ams. He was seen by GI who recommended lactulose for 2-3 bms per day. He will follow up in gi clinic 4 weeks after dc. Gi recommended cmp and inr in 1-2 weeks after dc.  ETOH abuse Patient given information on resources for quitting  etoh. Given his highly elevated etoh on admission, it is probably that alcohol abuse is the root of most of his medical problems. Patient will need extensive counseling by pcp.  He was discharged to home with a script for lactulose and slowmag. He has a follow up appointment on  10/29 for hospital f/u. He will have a cmp, cbc, phos, mg drawn at that visit.  Issues for Follow Up:  1. Check cbc, cmp, phos, mg at clinic follow up. Orders already placed. 2. Check mental status 3. Alcohol cessation, patient will need extensive counseling 4. Monitor lactulose use, if altered please order ammonia. Needs to have 2-3 BM per day with lactulose. 5. Per GI recs patient will need inr in 1-2 weeks after dc 6. If magnesium ok, can stop slowmag  Significant Procedures: TTE  Significant Labs and Imaging:   Recent Labs Lab 10/17/17 0915 10/18/17 0801 10/19/17 0426  WBC 1.9* 1.5* 1.5*  HGB 8.1* 7.8* 7.5*  HCT 22.3* 21.3* 20.9*  PLT 24* 24* 29*    Recent Labs Lab 10/16/17 0338 10/17/17 0505 10/18/17 0529 10/19/17 0426 10/19/17 0826 10/20/17 0614  NA 133* 131* 131* 132*  --  131*  K 2.8* 3.1* 3.1* 3.9  --  3.9  CL 93* 94* 97* 98*  --  100*  CO2 32 28 26 26   --  21*  GLUCOSE 68 108* 90 92  --  78  BUN 16 10 10 11   --  11  CREATININE 2.23* 1.56* 1.54* 1.42*  --  1.35*  CALCIUM 7.3* 7.1* 7.3* 7.5*  --  7.9*  MG 1.8 1.2* 1.0*  --  0.9* 1.4*  PHOS 1.3* 1.5* 1.5* 1.7*  --  3.5  ALKPHOS 119 111 120 126  --  128*  AST 374* 280* 179* 129*  --  104*  ALT 93* 82* 61 51  --  44  ALBUMIN 2.2* 2.2* 2.0* 2.0*  --  2.0*    Results/Tests Pending at Time of Discharge:  Discharge Medications:  Allergies as of 10/20/2017      Reactions   No Known Allergies       Medication List    STOP taking these medications   ibuprofen 600 MG tablet Commonly known as:  ADVIL,MOTRIN     TAKE these medications   allopurinol 100 MG tablet Commonly known as:  ZYLOPRIM Take 1 tablet (100 mg total) by mouth daily.   folic acid 1 MG tablet Commonly known as:  FOLVITE Take 1 tablet (1 mg total) by mouth daily.   gabapentin 100 MG capsule Commonly known as:  NEURONTIN Take 1 capsule (100 mg total) by mouth 3 (three) times daily.   Iron 325 (65 Fe) MG Tabs TAKE 1 TABLET BY  MOUTH ONCE DAILY   lactulose 10 GM/15ML solution Commonly known as:  CHRONULAC Take 30 mLs (20 g total) by mouth 2 (two) times daily.   magnesium chloride 64 MG Tbec SR tablet Commonly known as:  SLOW-MAG Take 1 tablet (64 mg total) by mouth daily.   magnesium oxide 400 (241.3 Mg) MG tablet Commonly known as:  MAG-OX Take 2 tablets (800 mg total) by mouth 2 (two) times daily.   multivitamin with minerals Tabs tablet Take 1 tablet by mouth daily.   omeprazole 40 MG capsule Commonly known as:  PRILOSEC Take 1 capsule (40 mg total) by mouth daily.   oxyCODONE 5 MG immediate release tablet Commonly known as:  Oxy IR/ROXICODONE Take 1 tablet (5 mg total)  by mouth every 8 (eight) hours as needed for breakthrough pain.   propranolol 40 MG tablet Commonly known as:  INDERAL TAKE ONE TABLET BY MOUTH TWICE DAILY What changed:  See the new instructions.   thiamine 100 MG tablet Commonly known as:  VITAMIN B-1 Take 100 mg by mouth daily.       Discharge Instructions: Please refer to Patient Instructions section of EMR for full details.  Patient was counseled important signs and symptoms that should prompt return to medical care, changes in medications, dietary instructions, activity restrictions, and follow up appointments.   Follow-Up Appointments: Follow-up Information    Gastroenterology, [facility]. Schedule an appointment as soon as possible for a visit in 4 week(s).   Contact information: [address] [place] [address] [phone]        Sherene Sires, DO Follow up on 10/23/2017.   Specialty:  Family Medicine Why:  at 4:00 pm Contact information: [address] [address] [phone]           Guadalupe Dawn, MD 10/20/2017, 3:22 PM PGY-1, [facility]

## 2017-10-20 DIAGNOSIS — E162 Hypoglycemia, unspecified: Secondary | ICD-10-CM

## 2017-10-20 DIAGNOSIS — N179 Acute kidney failure, unspecified: Secondary | ICD-10-CM

## 2017-10-20 LAB — COMPREHENSIVE METABOLIC PANEL
ALBUMIN: 2 g/dL — AB (ref 3.5–5.0)
ALK PHOS: 128 U/L — AB (ref 38–126)
ALT: 44 U/L (ref 17–63)
ANION GAP: 10 (ref 5–15)
AST: 104 U/L — AB (ref 15–41)
BUN: 11 mg/dL (ref 6–20)
CO2: 21 mmol/L — AB (ref 22–32)
Calcium: 7.9 mg/dL — ABNORMAL LOW (ref 8.9–10.3)
Chloride: 100 mmol/L — ABNORMAL LOW (ref 101–111)
Creatinine, Ser: 1.35 mg/dL — ABNORMAL HIGH (ref 0.61–1.24)
GFR calc Af Amer: >60 mL/min (ref >60)
GFR calc non Af Amer: 56 mL/min — ABNORMAL LOW (ref >60)
GLUCOSE: 78 mg/dL (ref 65–99)
POTASSIUM: 3.9 mmol/L (ref 3.5–5.1)
SODIUM: 131 mmol/L — AB (ref 135–145)
Total Bilirubin: 6.6 mg/dL — ABNORMAL HIGH (ref 0.3–1.2)
Total Protein: 5.9 g/dL — ABNORMAL LOW (ref 6.5–8.1)

## 2017-10-20 LAB — PHOSPHORUS: Phosphorus: 3.5 mg/dL (ref 2.5–4.6)

## 2017-10-20 LAB — MAGNESIUM: Magnesium: 1.4 mg/dL — ABNORMAL LOW (ref 1.7–2.4)

## 2017-10-20 MED ORDER — MAGNESIUM CHLORIDE 64 MG PO TBEC: 1.0000 | DELAYED_RELEASE_TABLET | Freq: Every day | ORAL | 0 refills | Status: DC

## 2017-10-20 MED ORDER — MAGNESIUM CHLORIDE 64 MG PO TBEC
1.0000 | DELAYED_RELEASE_TABLET | Freq: Every day | ORAL | Status: DC
  Administered 2017-10-20: 64 mg via ORAL
  Filled 2017-10-20: qty 1

## 2017-10-20 NOTE — Progress Notes (Signed)
Gave patient education related to alcohol use and dependence

## 2017-10-20 NOTE — Discharge Summary (Signed)
Patient discharged via private vehicle. Discussed with patient need to quit drinking and importance of taking prescribed meds. Pt verbalizes understanding. AVS dicussed with patient.

## 2017-10-20 NOTE — Progress Notes (Signed)
Family Medicine Teaching Service Daily Progress Note Intern Pager: [phone]  Patient name: William [name] Medical record number: [id number] Date of birth: 05/14/1961 Age: 56 y.o. Gender: male  Primary Care Provider: Marjie Skiff, MD Consultants: GI, CCM Code Status: Full  Pt Overview and Major Events to Date:  10/20 Admitted to ED  10/22 GI consulted 10/23 off pressors and precedex 10/24 transfer to teaching service  Assessment and Plan: Patient is 56 yo male with a past medical history significant for alcohol cirrhosis with esophageal varices, HTN, CKD 3, CHF, anemia who  presented with altered mental status from hypoglycemia in setting of ETOH abuse and poor po intake.  Altered mental status: Patient is back to his baseline mental status. He is alert and oriented x4. He has been able to ambulate with no difficulty. Feeding well with no aspiration or n/v. Patient had elevated ammonia up to 113, and alcohol level of 166. He also had severe hypoglycemia and severe acidosis. This was likely multifactorial and has improved with management of the above. - Resolved, able to do adl's - continue lactulose - home health pt - ready for dc  Alcoholic cirrhosis No overt ascites on exam. MELD score 27. 3 month mortality risk if 19%. Appreciate GI recs. - titrate lactulose for 2-3 bowel movements per day - avoid alcohol, nsaids - ok for dc home from gi standpoint - f/u gi in 4 weeks - recommend cmp/inr in 1-2 weeks with pcp  Concern for sepsis: No identifiable source of infection. Cultures negative to date. Initially started on vancomycin and Zosyn. Vancomycin was stopped on 10/23. Zosyn was stopped on 10/24. No further fevers since the day of admission. Altered mental status resolved -Continue to monitor  Acute kidney injury Baseline creatinine 1.2. ON admission his cr 2.33. Cr 1.35 10/26, 1.42 10/25. Resolving. Likely pre-renal in setting of poor po intake. cmp at f/u 10/29. -KVO  fluids -Hold nephrotoxic medications  Hypokalemia: Resolved. 3.9 10/26. Will get follow up cmp on 10/29.  -Replete as needed  Hypomagnesemia: Mg 1.0 10/24, 1.4 10/26. Will replete with slowmag this am. Will send out with magnesium. Patient to follow up 10/29. Will draw a level at that visit. - slowmag one dose today - mg on 10/29  Severe protein calorie malnutrition. -Continue thiamine and folic acid -Nutrition consult  Neuropathy -Can resume home gabapentin  Pancytopenia: Likely due to underlying cirrhosis and splenomegaly. WBC 1.5. Hgb 7.5. Platelet 29. There could be some element of hemodilution as well. -trend CBC  -DC IV fluids  #Hyperglycemia, improved. Blood glucose have been well controlled since admission. Patient has not required sliding scale insulin. Patient is nondiabetic. Will continue to monitor CBGs.  FEN/GI: Heart healthy, SCD PPx: Protonix  Disposition: Discharged home with home PT  Subjective:  No acute event overnight. Patient has no complaints this morning. Reports bowel movement yesterday. He is asking to go home.   Objective: Temp:  [98.4 F (36.9 C)-98.6 F (37 C)] 98.4 F (36.9 C) (10/26 0536) Pulse Rate:  [88-96] 96 (10/26 0536) Resp:  [16-20] 18 (10/26 0536) BP: (121-127)/(79-85) 123/80 (10/26 0536) SpO2:  [99 %-100 %] 99 % (10/26 0536) Physical Exam:  GEN: Lying in bed, able to sit up in bed for exam, no apparent distress. Head: normocephalic and atraumatic  Eyes: conjunctiva pale, sclera icteric CVS: RRR, nl s1 & s2, no murmurs, no edema RESP: no IWOB, good air movement bilaterally, CTAB GI: BS present & normal, soft, ?mild ascites.  MSK: no focal  tenderness or notable swelling SKIN: Jaundice down to his waist NEURO: alert and oiented appropriately, no gross deficits  PSYCH: euthymic mood with congruent affect  Laboratory:  Recent Labs Lab 10/17/17 0915 10/18/17 0801 10/19/17 0426  WBC 1.9* 1.5* 1.5*  HGB 8.1* 7.8* 7.5*   HCT 22.3* 21.3* 20.9*  PLT 24* 24* 29*    Recent Labs Lab 10/17/17 0505 10/18/17 0529 10/19/17 0426  NA 131* 131* 132*  K 3.1* 3.1* 3.9  CL 94* 97* 98*  CO2 28 26 26   BUN 10 10 11   CREATININE 1.56* 1.54* 1.42*  CALCIUM 7.1* 7.3* 7.5*  PROT 6.1* 6.0* 5.8*  BILITOT 7.0* 7.9* 7.0*  ALKPHOS 111 120 126  ALT 82* 61 51  AST 280* 179* 129*  GLUCOSE 108* 90 92    Imaging/Diagnostic Tests: No results found.  Guadalupe Dawn, MD 10/20/2017, 7:22 AM PGY-1, [place] Intern pager: [phone], text pages welcome

## 2017-10-23 ENCOUNTER — Ambulatory Visit (INDEPENDENT_AMBULATORY_CARE_PROVIDER_SITE_OTHER): Payer: Medicare PPO | Admitting: Family Medicine

## 2017-10-23 ENCOUNTER — Encounter: Payer: Self-pay | Admitting: Family Medicine

## 2017-10-23 VITALS — BP 100/62 | HR 78 | Temp 98.2°F | Ht 71.0 in | Wt 205.0 lb

## 2017-10-23 DIAGNOSIS — K7290 Hepatic failure, unspecified without coma: Secondary | ICD-10-CM | POA: Diagnosis not present

## 2017-10-23 DIAGNOSIS — K7682 Hepatic encephalopathy: Secondary | ICD-10-CM

## 2017-10-23 DIAGNOSIS — E440 Moderate protein-calorie malnutrition: Secondary | ICD-10-CM

## 2017-10-23 NOTE — Patient Instructions (Signed)
It was a pleasure to see you today! Thank you for choosing [facility] for your primary care. William [name] was seen for hospital followup. Come back to the clinic if you have any new concerning symptoms, and go to the emergency room if you have any new mental confusion or life threatening symptoms  Today we drew some blood labs to check your progress since leaving the hospital.  We'll be in touch if there is anything that needs changing.  Until then, please continue to take your meds, avoid alcohol, and eat healthy meals.  You have appts here on the 5th and 29th of Nov.    If we did any lab work today, and the results require attention, either me or my nurse will get in touch with you. If everything is normal, you will get a letter in mail and a message via . If you don't hear from [place] in two weeks, please give [place] a call. Otherwise, we look forward to seeing you again at your next visit. If you have any questions or concerns before then, please call the clinic at [phone].  Please bring all your medications to every doctors visit  Sign up for My Chart to have easy access to your labs results, and communication with your Primary care physician.    Please check-out at the front desk before leaving the clinic.    Best,  Dr. Sherene Sires FAMILY MEDICINE RESIDENT - PGY1 10/23/2017 4:28 PM

## 2017-10-24 LAB — CMP AND LIVER
ALBUMIN: 2.7 g/dL — AB (ref 3.5–5.5)
ALT: 35 IU/L (ref 0–44)
AST: 78 IU/L — ABNORMAL HIGH (ref 0–40)
Alkaline Phosphatase: 165 IU/L — ABNORMAL HIGH (ref 39–117)
BILIRUBIN TOTAL: 6.2 mg/dL — AB (ref 0.0–1.2)
BILIRUBIN, DIRECT: 3.97 mg/dL — AB (ref 0.00–0.40)
BUN: 15 mg/dL (ref 6–24)
CALCIUM: 8.2 mg/dL — AB (ref 8.7–10.2)
CHLORIDE: 96 mmol/L (ref 96–106)
CO2: 20 mmol/L (ref 20–29)
Creatinine, Ser: 1.3 mg/dL — ABNORMAL HIGH (ref 0.76–1.27)
GFR calc non Af Amer: 60 mL/min/{1.73_m2} (ref >59)
GFR, EST AFRICAN AMERICAN: 70 mL/min/{1.73_m2} (ref >59)
Glucose: 95 mg/dL (ref 65–99)
POTASSIUM: 4.5 mmol/L (ref 3.5–5.2)
Sodium: 130 mmol/L — ABNORMAL LOW (ref 134–144)
Total Protein: 6.3 g/dL (ref 6.0–8.5)

## 2017-10-24 LAB — CBC WITH DIFFERENTIAL/PLATELET
BASOS ABS: 0 10*3/uL (ref 0.0–0.2)
BASOS: 1 %
EOS (ABSOLUTE): 0 10*3/uL (ref 0.0–0.4)
Eos: 0 %
HEMATOCRIT: 20.9 % — AB (ref 37.5–51.0)
Hemoglobin: 7.2 g/dL — ABNORMAL LOW (ref 13.0–17.7)
IMMATURE GRANULOCYTES: 0 %
Immature Grans (Abs): 0 10*3/uL (ref 0.0–0.1)
LYMPHS: 27 %
Lymphocytes Absolute: 0.6 10*3/uL — ABNORMAL LOW (ref 0.7–3.1)
MCH: 33.6 pg — ABNORMAL HIGH (ref 26.6–33.0)
MCHC: 34.4 g/dL (ref 31.5–35.7)
MCV: 98 fL — AB (ref 79–97)
MONOCYTES: 5 %
MONOS ABS: 0.1 10*3/uL (ref 0.1–0.9)
NEUTROS PCT: 67 %
Neutrophils Absolute: 1.6 10*3/uL (ref 1.4–7.0)
Platelets: 52 10*3/uL — CL (ref 150–379)
RBC: 2.14 x10E6/uL — CL (ref 4.14–5.80)
RDW: 17.3 % — AB (ref 12.3–15.4)
WBC: 2.3 10*3/uL — CL (ref 3.4–10.8)

## 2017-10-24 LAB — PHOSPHORUS: Phosphorus: 2.8 mg/dL (ref 2.5–4.5)

## 2017-10-24 LAB — MAGNESIUM: Magnesium: 1.3 mg/dL — ABNORMAL LOW (ref 1.6–2.3)

## 2017-10-24 NOTE — Progress Notes (Signed)
Subjective:  William [name] is a 56 y.o. male who presents to the [facility] today with a chief complaint of hospital followup.   Patient has been drinking less and intentionally eating full meals since discharge.  His brother was present and confirmed.   He is feeling better and complains only of some minor edema in legs.  He states he is taking his meds as prescribed.  We discussed importance of maintaining his nutrition and avoiding alcohol.   He thinks he can do well with family support.   Objective:  Physical Exam: BP 100/62   Pulse 78   Temp 98.2 F (36.8 C) (Oral)   Ht 5\' 11"  (1.803 m)   Wt 205 lb (93 kg)   SpO2 98%   BMI 28.59 kg/m   Gen: NAD, resting comfortably CV: RRR with no murmurs appreciated Pulm: NWOB, CTAB with no crackles, wheezes, or rhonchi GI: Normal bowel sounds present. Soft, Nontender, Nondistended. MSK: 1+ edema in LE bilaterally, cyanosis, or clubbing noted Skin: warm, dry Neuro: grossly normal, moves all extremities Psych: Normal affect and thought content  Results for orders placed or performed in visit on 10/23/17 (from the past 72 hour(s))  CBC with Differential     Status: Abnormal   Collection Time: 10/23/17  4:17 PM  Result Value Ref Range   WBC 2.3 (LL) 3.4 - 10.8 x10E3/uL   RBC 2.14 (LL) 4.14 - 5.80 x10E6/uL   Hemoglobin 7.2 (L) 13.0 - 17.7 g/dL   Hematocrit 20.9 (L) 37.5 - 51.0 %   MCV 98 (H) 79 - 97 fL   MCH 33.6 (H) 26.6 - 33.0 pg   MCHC 34.4 31.5 - 35.7 g/dL   RDW 17.3 (H) 12.3 - 15.4 %   Platelets 52 (LL) 150 - 379 x10E3/uL    Comment: Actual platelet count may be somewhat higher than reported due to aggregation of platelets in this sample.    Neutrophils 67 Not Estab. %   Lymphs 27 Not Estab. %   Monocytes 5 Not Estab. %   Eos 0 Not Estab. %   Basos 1 Not Estab. %   Neutrophils Absolute 1.6 1.4 - 7.0 x10E3/uL   Lymphocytes Absolute 0.6 (L) 0.7 - 3.1 x10E3/uL   Monocytes Absolute 0.1 0.1 - 0.9 x10E3/uL   EOS (ABSOLUTE) 0.0 0.0  - 0.4 x10E3/uL   Basophils Absolute 0.0 0.0 - 0.2 x10E3/uL   Immature Granulocytes 0 Not Estab. %   Immature Grans (Abs) 0.0 0.0 - 0.1 x10E3/uL   Hematology Comments: Note:     Comment: Verified by microscopic examination.  CMP and Liver     Status: Abnormal   Collection Time: 10/23/17  4:17 PM  Result Value Ref Range   Glucose 95 65 - 99 mg/dL   BUN 15 6 - 24 mg/dL   Creatinine, Ser 1.30 (H) 0.76 - 1.27 mg/dL   GFR calc non Af Amer 60 >59 mL/min/1.73   GFR calc Af Amer 70 >59 mL/min/1.73   Sodium 130 (L) 134 - 144 mmol/L   Potassium 4.5 3.5 - 5.2 mmol/L   Chloride 96 96 - 106 mmol/L   CO2 20 20 - 29 mmol/L   Calcium 8.2 (L) 8.7 - 10.2 mg/dL   Total Protein 6.3 6.0 - 8.5 g/dL   Albumin 2.7 (L) 3.5 - 5.5 g/dL   Bilirubin Total 6.2 (H) 0.0 - 1.2 mg/dL    Comment: Note: Specimen is icteric.   Bilirubin, Direct 3.97 (H) 0.00 - 0.40  mg/dL   Alkaline Phosphatase 165 (H) 39 - 117 IU/L   AST 78 (H) 0 - 40 IU/L   ALT 35 0 - 44 IU/L  Phosphorus     Status: None   Collection Time: 10/23/17  4:17 PM  Result Value Ref Range   Phosphorus 2.8 2.5 - 4.5 mg/dL  Magnesium     Status: Abnormal   Collection Time: 10/23/17  4:17 PM  Result Value Ref Range   Magnesium 1.3 (L) 1.6 - 2.3 mg/dL     Assessment/Plan:  Encephalopathy, hepatic (HCC) CBC/CMP/MAg/Phos  Will arrange f/u with Dr. Andy Gauss at end of month.  Patient has INR check next week.   Sherene Sires, DO FAMILY MEDICINE RESIDENT - PGY1 10/24/2017 6:58 PM

## 2017-10-24 NOTE — Assessment & Plan Note (Signed)
CBC/CMP/MAg/Phos  Will arrange f/u with Dr. Andy Gauss at end of month.  Patient has INR check next week.

## 2017-10-26 ENCOUNTER — Inpatient Hospital Stay: Payer: Medicare PPO | Admitting: Internal Medicine

## 2017-10-30 ENCOUNTER — Other Ambulatory Visit: Payer: Self-pay | Admitting: Family Medicine

## 2017-10-30 ENCOUNTER — Other Ambulatory Visit: Payer: Medicare PPO

## 2017-10-30 DIAGNOSIS — K7290 Hepatic failure, unspecified without coma: Secondary | ICD-10-CM

## 2017-10-30 DIAGNOSIS — K7682 Hepatic encephalopathy: Secondary | ICD-10-CM

## 2017-10-30 NOTE — Telephone Encounter (Signed)
Spoke with patient and his last script wasn't enough and patient states that he is still taking 2 tablets BID.  Will forward to MD to clarify dosing of medication and send over a new script to reflect up to date dose. Jazmin Hartsell,CMA

## 2017-10-30 NOTE — Telephone Encounter (Signed)
Patient is only able to received only 2 weeks worth of Magnesium Oxide (?) but needs to receive enough for one month.  Pharmacy is [facility] on [address]  Please let him know what we can do, phone # [phone].

## 2017-10-31 ENCOUNTER — Other Ambulatory Visit: Payer: Self-pay | Admitting: Family Medicine

## 2017-10-31 LAB — CBC WITH DIFFERENTIAL/PLATELET
Basophils Absolute: 0 10*3/uL (ref 0.0–0.2)
Basos: 1 %
EOS (ABSOLUTE): 0 10*3/uL (ref 0.0–0.4)
Eos: 2 %
HEMOGLOBIN: 7.2 g/dL — AB (ref 13.0–17.7)
Hematocrit: 21.4 % — ABNORMAL LOW (ref 37.5–51.0)
IMMATURE GRANULOCYTES: 1 %
Immature Grans (Abs): 0 10*3/uL (ref 0.0–0.1)
LYMPHS ABS: 0.5 10*3/uL — AB (ref 0.7–3.1)
Lymphs: 28 %
MCH: 33 pg (ref 26.6–33.0)
MCHC: 33.6 g/dL (ref 31.5–35.7)
MCV: 98 fL — ABNORMAL HIGH (ref 79–97)
MONOS ABS: 0.1 10*3/uL (ref 0.1–0.9)
Monocytes: 5 %
NEUTROS PCT: 63 %
Neutrophils Absolute: 1.2 10*3/uL — ABNORMAL LOW (ref 1.4–7.0)
PLATELETS: 87 10*3/uL — AB (ref 150–379)
RBC: 2.18 x10E6/uL — AB (ref 4.14–5.80)
RDW: 17.2 % — AB (ref 12.3–15.4)
WBC: 1.9 10*3/uL — AB (ref 3.4–10.8)

## 2017-10-31 LAB — CMP14+EGFR
A/G RATIO: 0.7 — AB (ref 1.2–2.2)
ALK PHOS: 158 IU/L — AB (ref 39–117)
ALT: 36 IU/L (ref 0–44)
AST: 99 IU/L — AB (ref 0–40)
Albumin: 2.7 g/dL — ABNORMAL LOW (ref 3.5–5.5)
BUN / CREAT RATIO: 12 (ref 9–20)
BUN: 16 mg/dL (ref 6–24)
Bilirubin Total: 5.2 mg/dL — ABNORMAL HIGH (ref 0.0–1.2)
CALCIUM: 8.8 mg/dL (ref 8.7–10.2)
CO2: 19 mmol/L — ABNORMAL LOW (ref 20–29)
Chloride: 104 mmol/L (ref 96–106)
Creatinine, Ser: 1.34 mg/dL — ABNORMAL HIGH (ref 0.76–1.27)
GFR calc Af Amer: 67 mL/min/{1.73_m2} (ref >59)
GFR, EST NON AFRICAN AMERICAN: 58 mL/min/{1.73_m2} — AB (ref >59)
GLOBULIN, TOTAL: 4.1 g/dL (ref 1.5–4.5)
Glucose: 123 mg/dL — ABNORMAL HIGH (ref 65–99)
POTASSIUM: 4.1 mmol/L (ref 3.5–5.2)
SODIUM: 137 mmol/L (ref 134–144)
Total Protein: 6.8 g/dL (ref 6.0–8.5)

## 2017-10-31 LAB — PHOSPHORUS: PHOSPHORUS: 3.3 mg/dL (ref 2.5–4.5)

## 2017-10-31 LAB — HEPATIC FUNCTION PANEL: Bilirubin, Direct: 3.15 mg/dL — ABNORMAL HIGH (ref 0.00–0.40)

## 2017-10-31 LAB — MAGNESIUM: Magnesium: 1.3 mg/dL — ABNORMAL LOW (ref 1.6–2.3)

## 2017-10-31 MED ORDER — MAGNESIUM OXIDE 400 (241.3 MG) MG PO TABS: 2.0000 | ORAL_TABLET | Freq: Two times a day (BID) | ORAL | 0 refills | Status: DC

## 2017-10-31 NOTE — Progress Notes (Signed)
MED RECONCILIATION:  Patient's mag was low, somehow [facility] had been giving him drastically different and lower dose than was prescribed in our system.   I called patient (and his sister who helps manage meds).   Cancelled the slow mag, represcribed the magox over the phone for 30 days as written in our system.   Will recheck labs at next visit.

## 2017-11-08 ENCOUNTER — Other Ambulatory Visit: Payer: Self-pay | Admitting: Student

## 2017-11-08 ENCOUNTER — Other Ambulatory Visit: Payer: Self-pay | Admitting: Family Medicine

## 2017-11-17 ENCOUNTER — Telehealth: Payer: Self-pay | Admitting: Pharmacist

## 2017-11-17 NOTE — Patient Outreach (Signed)
[facility]) Care Management  11/17/2017  OSMANY AZER 1961-04-11 [id number]    56 year old male active in [place] registry noted to have been recently hospitalized at [facility] 10/14/2017 - 10/20/2017 for AMS, hypoglycemia, EtOH intoxication, and metabolic acidosis.  PMHx includes, but not limited to, alcohol abuse, alcoholic cirrhosis, chronic kidney disease stage III, ED, HTN, gout, anemia, hypothyroidism.   Successful telephone call to Mr. Borgmeyer today to review  medications post-discharge with patient.  HIPAA identifiers verified.   Drugs sorted by system: Cardiovascular: propranolol  Gastrointestinal: lactulose, omeprazole  Pain: gabapentin, oxycodone IR  Vitamins/Minerals: magnesium, MVI, folic acid, iron, thiamine  Miscellaneous: allopurinol  Noted ibuprofen was discontinued at discharge due to chronic kidney disease.   All medications at discharge have been reviewed electronically and with Mr. Featherly.  No questions or concerns noted by patient.   Ralene Bathe, PharmD, [place] [phone]

## 2017-11-23 ENCOUNTER — Ambulatory Visit: Payer: Medicare PPO | Admitting: Family Medicine

## 2017-11-23 ENCOUNTER — Encounter: Payer: Self-pay | Admitting: Family Medicine

## 2017-11-23 ENCOUNTER — Other Ambulatory Visit: Payer: Self-pay

## 2017-11-23 VITALS — BP 130/80 | HR 95 | Temp 97.6°F | Ht 71.0 in | Wt 194.6 lb

## 2017-11-23 DIAGNOSIS — M25562 Pain in left knee: Secondary | ICD-10-CM | POA: Diagnosis not present

## 2017-11-23 DIAGNOSIS — G8929 Other chronic pain: Secondary | ICD-10-CM | POA: Diagnosis not present

## 2017-11-23 DIAGNOSIS — M25561 Pain in right knee: Secondary | ICD-10-CM

## 2017-11-23 DIAGNOSIS — K7030 Alcoholic cirrhosis of liver without ascites: Secondary | ICD-10-CM | POA: Diagnosis not present

## 2017-11-23 MED ORDER — IBUPROFEN 600 MG PO TABS: 600.0000 mg | ORAL_TABLET | Freq: Four times a day (QID) | ORAL | 0 refills | Status: AC | PRN

## 2017-11-23 NOTE — Progress Notes (Signed)
Subjective:    Patient ID: William [name], male    DOB: 1961/02/13, 56 y.o.   MRN: [id number]   CC: Follow-up after recent hospitalization for altered mental status and hypoglycemia  HPI: Patient is a 56 year old male with past medical history significant for alcohol abuse who was recently hospitalized after recent episode of hypoglycemia and altered mental status in setting of likely alcohol intoxication.  Patient was discharged on 10/26 and was seen in clinic on 10/30.  Repeat labs have been within normal limits except for low magnesium for which patient has been taking some supplements.  Patient reports increase appetite and but is still drinking alcohol.  Last alcoholic drink was 3 days ago.  Patient is eating 3 meals a day.  Patient does not have any acute complaints today except for bilateral knee pain that has been chronic.  Patient denies any chest pain, shortness of breath, abdominal pain, hematemesis, blood in stool, distended abdomen.  Smoking status reviewed   ROS: all other systems were reviewed and are negative other than in the HPI   Past Medical History:  Diagnosis Date  . Anemia   . Arthritis    "knees" (10/23/218)  . CHF (congestive heart failure) ([place])   . Chronic kidney disease   . Cirrhosis of liver ([place])   . COMPRESSION FRACTURE, LUMBAR VERTEBRAE 07/29/2010   Qualifier: Diagnosis of  By: Anabel Bene    . ETOH abuse   . GERD (gastroesophageal reflux disease)   . Gout   . History of blood transfusion    "I was low"  . Hypertension   . Prostate cancer [facility])    pt denies this hx on 10/17/2017    Past Surgical History:  Procedure Laterality Date  . ACHILLES TENDON REPAIR Right   . ESOPHAGOGASTRODUODENOSCOPY (EGD) WITH PROPOFOL N/A 09/13/2016   Procedure: ESOPHAGOGASTRODUODENOSCOPY (EGD) WITH PROPOFOL;  Surgeon: Wonda Horner, MD;  Location: [facility] ENDOSCOPY;  Service: Endoscopy;  Laterality: N/A;  . FRACTURE SURGERY    . ORIF HUMERUS FRACTURE Left  06/30/2017   Procedure: OPEN REDUCTION INTERNAL FIXATION (ORIF) PROXIMAL HUMERUS FRACTURE;  Surgeon: Leandrew Koyanagi, MD;  Location: [place];  Service: Orthopedics;  Laterality: Left;    Past medical history, surgical, family, and social history reviewed and updated in the EMR as appropriate.  Objective:  BP 130/80   Pulse 95   Temp 97.6 F (36.4 C) (Oral)   Ht 5\' 11"  (1.803 m)   Wt 194 lb 9.6 oz (88.3 kg)   SpO2 99%   BMI 27.14 kg/m   Vitals and nursing note reviewed  General: NAD, pleasant, able to participate in exam Cardiac: RRR, normal heart sounds, no murmurs. 2+ radial and PT pulses bilaterally Respiratory: CTAB, normal effort, No wheezes, rales or rhonchi Abdomen: soft, nontender, nondistended, no hepatic or splenomegaly, +BS Extremities: no edema or cyanosis. WWP. Skin: warm and dry, no rashes noted Neuro: alert and oriented x4, no focal deficits Psych: Normal affect and mood   Assessment & Plan:    #Hospital follow-up for hyperglycemia and  altered mental status Patient continued to progress well clinically.  Patient is still consuming alcohol but appears to be eating more and taking medication as prescribed.  Magnesium levels were low on lab work from 11/5.  Patient was supposed to get INR check per GI request 2 weeks ago but did not have it done.  Discussed with patient need for alcohol cessation given multiple recent hospitalization and declining health.  Family  has been supportive through these times. --We will order CMP, mag level, phosphorus level and INR --We will follow-up with results, will likely discontinue Slow-Mag if level are within normal limits.  #Bilateral knee pain, chronic Patient complains of bilateral knee pain for the past few weeks.  Patient has not taking any medications for it.  Patient has a history of osteoarthritis.  Last imaging was in 2015.  Patient has tried ibuprofen in the past with some relief.  We will repeat imaging.  Counseled on  ibuprofen use and risk of GI bleed as well as worsening kidney function.  Patient will use as needed.  --Order bilateral knee x-ray 2 view --Prescribed ibuprofen 600 mg as needed   Marjie Skiff, MD [place] PGY-2

## 2017-11-23 NOTE — Patient Instructions (Signed)
It was great seeing you today! We have addressed the following issues today  1. We will do some blood work to follow up from last visit. I will let you know if you need to stop your magnesium. 2. I sent an order for X ray of both knees. Will follow up on your results. 3. Keep taking your medications as prescribed. 4. No Alcohol as discussed. Make sure you are eating 3 meals a day.    If we did any lab work today, and the results require attention, either me or my nurse will get in touch with you. If everything is normal, you will get a letter in mail and a message via . If you don't hear from [place] in two weeks, please give [place] a call. Otherwise, we look forward to seeing you again at your next visit. If you have any questions or concerns before then, please call the clinic at [phone].  Please bring all your medications to every doctors visit  Sign up for My Chart to have easy access to your labs results, and communication with your Primary care physician. Please ask Front Desk for some assistance.   Please check-out at the front desk before leaving the clinic.    Take Care,   Dr. Andy Gauss

## 2017-11-24 LAB — CMP14+EGFR
ALBUMIN: 3.1 g/dL — AB (ref 3.5–5.5)
ALT: 30 IU/L (ref 0–44)
AST: 97 IU/L — ABNORMAL HIGH (ref 0–40)
Albumin/Globulin Ratio: 0.7 — ABNORMAL LOW (ref 1.2–2.2)
Alkaline Phosphatase: 159 IU/L — ABNORMAL HIGH (ref 39–117)
BUN / CREAT RATIO: 8 — AB (ref 9–20)
BUN: 12 mg/dL (ref 6–24)
Bilirubin Total: 3.1 mg/dL — ABNORMAL HIGH (ref 0.0–1.2)
CALCIUM: 9.5 mg/dL (ref 8.7–10.2)
CO2: 20 mmol/L (ref 20–29)
CREATININE: 1.43 mg/dL — AB (ref 0.76–1.27)
Chloride: 102 mmol/L (ref 96–106)
GFR, EST AFRICAN AMERICAN: 62 mL/min/{1.73_m2} (ref >59)
GFR, EST NON AFRICAN AMERICAN: 54 mL/min/{1.73_m2} — AB (ref >59)
GLUCOSE: 98 mg/dL (ref 65–99)
Globulin, Total: 4.3 g/dL (ref 1.5–4.5)
Potassium: 4 mmol/L (ref 3.5–5.2)
Sodium: 137 mmol/L (ref 134–144)
TOTAL PROTEIN: 7.4 g/dL (ref 6.0–8.5)

## 2017-11-24 LAB — PROTIME-INR
INR: 1.2 (ref 0.8–1.2)
PROTHROMBIN TIME: 12.6 s — AB (ref 9.1–12.0)

## 2017-11-24 LAB — MAGNESIUM: Magnesium: 1.5 mg/dL — ABNORMAL LOW (ref 1.6–2.3)

## 2017-11-24 LAB — PHOSPHORUS: Phosphorus: 4.4 mg/dL (ref 2.5–4.5)

## 2017-11-27 ENCOUNTER — Other Ambulatory Visit: Payer: Self-pay | Admitting: Family Medicine

## 2017-11-28 ENCOUNTER — Ambulatory Visit (INDEPENDENT_AMBULATORY_CARE_PROVIDER_SITE_OTHER): Payer: Medicare PPO

## 2017-11-28 ENCOUNTER — Ambulatory Visit (INDEPENDENT_AMBULATORY_CARE_PROVIDER_SITE_OTHER): Payer: Medicare PPO | Admitting: Orthopaedic Surgery

## 2017-11-28 DIAGNOSIS — S42202D Unspecified fracture of upper end of left humerus, subsequent encounter for fracture with routine healing: Secondary | ICD-10-CM | POA: Diagnosis not present

## 2017-11-28 DIAGNOSIS — M25512 Pain in left shoulder: Secondary | ICD-10-CM

## 2017-11-28 NOTE — Progress Notes (Signed)
Office Visit Note   Patient: William [name]           Date of Birth: 1961-10-30           MRN: [id number] Visit Date: 11/28/2017              Requested by: Marjie Skiff, MD [address] [place], [place] [address] PCP: Marjie Skiff, MD   Assessment & Plan: Visit Diagnoses:  1. Left shoulder pain, unspecified chronicity   2. Closed fracture of proximal end of left humerus with routine healing, unspecified fracture morphology, subsequent encounter     Plan: Impression is healed severe left proximal humerus fracture with post traumatic stiffness.  I reviewed the x-rays with the patient which demonstrate complete healing of the fracture without any complications.  He has abundant callus formation.  He did do physical therapy.  At this point patient has reached MMI.  He understands that he will always have permanent stiffness of the shoulder related to the injury.  Questions encouraged and answered.  Follow-up as needed.  Follow-Up Instructions: Return if symptoms worsen or fail to improve.   Orders:  Orders Placed This Encounter  Procedures  . XR Shoulder Left   No orders of the defined types were placed in this encounter.     Procedures: No procedures performed   Clinical Data: No additional findings.   Subjective: No chief complaint on file.   Patient comes in today for left shoulder pain.  He complains of stiffness and difficulty with certain movements.  He denies any constant or chronic pain.  Denies any numbness or tingling.    Review of Systems  Constitutional: Negative.   All other systems reviewed and are negative.    Objective: Vital Signs: There were no vitals taken for this visit.  Physical Exam  Constitutional: He is oriented to person, place, and time. He appears well-developed and well-nourished.  Pulmonary/Chest: Effort normal.  Abdominal: Soft.  Neurological: He is alert and oriented to person, place, and time.  Skin: Skin is warm.    Psychiatric: He has a normal mood and affect. His behavior is normal. Judgment and thought content normal.  Nursing note and vitals reviewed.   Ortho Exam Left shoulder exam shows a fully healed surgical scar.  He does have expected postsurgical and posttraumatic stiffness of the shoulder.  Rotator cuff is grossly intact.  Active and passive range of motion are symmetric. Specialty Comments:  No specialty comments available.  Imaging: Xr Shoulder Left  Result Date: 11/28/2017 Healed proximal humerus fracture with abundant callus formation.  No complications.    PMFS History: Patient Active Problem List   Diagnosis Date Noted  . AKI (acute kidney injury) ([place])   . Hypoglycemia   . Metabolic acidosis   . Lactic acidosis   . Acute encephalopathy 10/14/2017  . Bleeding   . Fracture   . Closed fracture of proximal end of left humerus with routine healing 06/30/2017  . Leg swelling 05/29/2017  . Hypothyroidism 05/29/2017  . Tongue ulcer   . Hyponatremia   . Acute kidney injury (nontraumatic) ([place])   . Anemia 05/17/2017  . Dark urine 09/16/2016  . Diarrhea   . Colon wall thickening   . Anemia due to bone marrow failure ([place])   . Malnutrition of moderate degree 09/10/2016  . Unintentional weight loss   . Dehydration   . Hypomagnesemia   . Hypokalemia   . Hypocalcemia   . Vomiting, persistent, in adult   .  Intractable hiccups   . Decreased appetite 09/09/2016  . Symptomatic anemia 09/09/2016  . Chronic pain 02/08/2016  . Pancytopenia ([place]) 05/29/2015  . History of esophageal varices 08/20/2012  . Shoulder pain, right 06/27/2012  . Gout 06/27/2012  . MGUS (monoclonal gammopathy of unknown significance) 06/15/2012  . Hypertension 02/28/2012  . Cervical pain (neck) 01/16/2012  . Alcohol abuse 01/16/2012  . Chronic kidney disease (CKD), stage III (moderate) ([place]) 12/13/2011  . Erectile dysfunction 12/13/2011  . Back pain 08/11/2011  . Shoulder pain, left 02/07/2011  .  Knee pain 02/07/2011  . Cirrhosis ([place]) 08/04/2010  . Encephalopathy, hepatic ([place]) 07/29/2010  . Alcoholic cirrhosis of liver ([place]) 02/23/2010   Past Medical History:  Diagnosis Date  . Anemia   . Arthritis    "knees" (10/23/218)  . CHF (congestive heart failure) ([place])   . Chronic kidney disease   . Cirrhosis of liver ([place])   . COMPRESSION FRACTURE, LUMBAR VERTEBRAE 07/29/2010   Qualifier: Diagnosis of  By: Anabel Bene    . ETOH abuse   . GERD (gastroesophageal reflux disease)   . Gout   . History of blood transfusion    "I was low"  . Hypertension   . Prostate cancer [facility])    pt denies this hx on 10/17/2017    Family History  Problem Relation Age of Onset  . Kidney disease Mother   . Arthritis Mother   . Hypertension Father     Past Surgical History:  Procedure Laterality Date  . ACHILLES TENDON REPAIR Right   . ESOPHAGOGASTRODUODENOSCOPY (EGD) WITH PROPOFOL N/A 09/13/2016   Procedure: ESOPHAGOGASTRODUODENOSCOPY (EGD) WITH PROPOFOL;  Surgeon: Wonda Horner, MD;  Location: [facility] ENDOSCOPY;  Service: Endoscopy;  Laterality: N/A;  . FRACTURE SURGERY    . ORIF HUMERUS FRACTURE Left 06/30/2017   Procedure: OPEN REDUCTION INTERNAL FIXATION (ORIF) PROXIMAL HUMERUS FRACTURE;  Surgeon: Leandrew Koyanagi, MD;  Location: [place];  Service: Orthopedics;  Laterality: Left;   Social History   Occupational History  . Not on file  Tobacco Use  . Smoking status: Never Smoker  . Smokeless tobacco: Never Used  Substance and Sexual Activity  . Alcohol use: Yes    Alcohol/week: 19.2 oz    Types: 32 Shots of liquor per week    Comment: 10/17/2017 "2, 1/5th bottle of liquor/week"  . Drug use: No  . Sexual activity: Not Currently

## 2017-12-07 ENCOUNTER — Other Ambulatory Visit: Payer: Self-pay | Admitting: Family Medicine

## 2017-12-13 ENCOUNTER — Other Ambulatory Visit: Payer: Self-pay | Admitting: Family Medicine

## 2017-12-19 ENCOUNTER — Other Ambulatory Visit: Payer: Self-pay

## 2017-12-19 ENCOUNTER — Encounter (HOSPITAL_COMMUNITY): Payer: Self-pay | Admitting: Emergency Medicine

## 2017-12-19 ENCOUNTER — Emergency Department (HOSPITAL_COMMUNITY)
Admission: EM | Admit: 2017-12-19 | Discharge: 2017-12-19 | Disposition: A | Discharge status: Home / Self Care | Payer: Medicare PPO | Attending: Emergency Medicine | Admitting: Emergency Medicine

## 2017-12-19 DIAGNOSIS — Z79899 Other long term (current) drug therapy: Secondary | ICD-10-CM | POA: Insufficient documentation

## 2017-12-19 DIAGNOSIS — Z8546 Personal history of malignant neoplasm of prostate: Secondary | ICD-10-CM | POA: Insufficient documentation

## 2017-12-19 DIAGNOSIS — E7220 Disorder of urea cycle metabolism, unspecified: Secondary | ICD-10-CM | POA: Insufficient documentation

## 2017-12-19 DIAGNOSIS — I130 Hypertensive heart and chronic kidney disease with heart failure and stage 1 through stage 4 chronic kidney disease, or unspecified chronic kidney disease: Secondary | ICD-10-CM | POA: Insufficient documentation

## 2017-12-19 DIAGNOSIS — I509 Heart failure, unspecified: Secondary | ICD-10-CM | POA: Insufficient documentation

## 2017-12-19 DIAGNOSIS — R799 Abnormal finding of blood chemistry, unspecified: Secondary | ICD-10-CM | POA: Diagnosis present

## 2017-12-19 DIAGNOSIS — N183 Chronic kidney disease, stage 3 (moderate): Secondary | ICD-10-CM | POA: Diagnosis not present

## 2017-12-19 DIAGNOSIS — K7030 Alcoholic cirrhosis of liver without ascites: Secondary | ICD-10-CM | POA: Diagnosis not present

## 2017-12-19 DIAGNOSIS — E039 Hypothyroidism, unspecified: Secondary | ICD-10-CM | POA: Diagnosis not present

## 2017-12-19 LAB — COMPREHENSIVE METABOLIC PANEL
ALBUMIN: 3.2 g/dL — AB (ref 3.5–5.0)
ALT: 44 U/L (ref 17–63)
ANION GAP: 15 (ref 5–15)
AST: 121 U/L — AB (ref 15–41)
Alkaline Phosphatase: 154 U/L — ABNORMAL HIGH (ref 38–126)
BUN: 10 mg/dL (ref 6–20)
CO2: 21 mmol/L — AB (ref 22–32)
Calcium: 9.2 mg/dL (ref 8.9–10.3)
Chloride: 101 mmol/L (ref 101–111)
Creatinine, Ser: 1.25 mg/dL — ABNORMAL HIGH (ref 0.61–1.24)
GFR calc Af Amer: >60 mL/min (ref >60)
GFR calc non Af Amer: >60 mL/min (ref >60)
GLUCOSE: 143 mg/dL — AB (ref 65–99)
POTASSIUM: 3.1 mmol/L — AB (ref 3.5–5.1)
SODIUM: 137 mmol/L (ref 135–145)
Total Bilirubin: 2.2 mg/dL — ABNORMAL HIGH (ref 0.3–1.2)
Total Protein: 8.4 g/dL — ABNORMAL HIGH (ref 6.5–8.1)

## 2017-12-19 LAB — CBC WITH DIFFERENTIAL/PLATELET
BASOS ABS: 0 10*3/uL (ref 0.0–0.1)
BASOS PCT: 1 %
EOS ABS: 0 10*3/uL (ref 0.0–0.7)
Eosinophils Relative: 1 %
HEMATOCRIT: 28.6 % — AB (ref 39.0–52.0)
Hemoglobin: 10 g/dL — ABNORMAL LOW (ref 13.0–17.0)
Lymphocytes Relative: 43 %
Lymphs Abs: 0.9 10*3/uL (ref 0.7–4.0)
MCH: 34.2 pg — ABNORMAL HIGH (ref 26.0–34.0)
MCHC: 35 g/dL (ref 30.0–36.0)
MCV: 97.9 fL (ref 78.0–100.0)
MONOS PCT: 4 %
Monocytes Absolute: 0.1 10*3/uL (ref 0.1–1.0)
NEUTROS ABS: 1.1 10*3/uL — AB (ref 1.7–7.7)
Neutrophils Relative %: 51 %
Platelets: 51 10*3/uL — ABNORMAL LOW (ref 150–400)
RBC: 2.92 MIL/uL — ABNORMAL LOW (ref 4.22–5.81)
RDW: 14.3 % (ref 11.5–15.5)
WBC: 2.2 10*3/uL — ABNORMAL LOW (ref 4.0–10.5)

## 2017-12-19 LAB — PROTIME-INR
INR: 1.25
Prothrombin Time: 15.6 seconds — ABNORMAL HIGH (ref 11.4–15.2)

## 2017-12-19 LAB — AMMONIA: AMMONIA: 113 umol/L — AB (ref 9–35)

## 2017-12-19 NOTE — ED Triage Notes (Signed)
Pt sent by PCP to have ammonia level checked. Hx cirrhosis, family reports increased drowsiness.

## 2017-12-19 NOTE — Discharge Instructions (Signed)
Please follow-up with your primary doctor after the new year has been discussed Please be sure to take her lactulose at least twice a day Please avoid all alcohol

## 2017-12-19 NOTE — ED Provider Notes (Signed)
[facility] EMERGENCY DEPARTMENT Provider Note   CSN: [id number] Arrival date & time: 12/19/17  2037     History   Chief Complaint Chief Complaint  Patient presents with  . Abnormal Lab    HPI William [name] is a 56 y.o. male.  The history is provided by the patient and a relative.  Abnormal Lab  Result type comment:  Ammonia Illness  This is a new problem. The problem occurs constantly. The problem has been gradually improving. Pertinent negatives include no chest pain, no abdominal pain, no headaches and no shortness of breath. Nothing aggravates the symptoms. Relieved by: Lactulose.  Patient with history of alcoholic cirrhosis, chronic kidney disease presents with mild confusion/disorientation with concerns for  elevated ammonia Patient presents with his brother and family Apparently his family has noticed that patient has been more confused over the past day, and he spoke to his primary care physician who requested a ER visit He denies any complaints, he denies fever, denies vomiting, denies headache Patient is requesting discharge home After discussion with family, it is reported that he has skipped several doses of his lactulose the past few days, but he has now restarted this. They also note that he has been drinking alcohol again  No other acute issues at this time Past Medical History:  Diagnosis Date  . Anemia   . Arthritis    "knees" (10/23/218)  . CHF (congestive heart failure) ([place])   . Chronic kidney disease   . Cirrhosis of liver ([place])   . COMPRESSION FRACTURE, LUMBAR VERTEBRAE 07/29/2010   Qualifier: Diagnosis of  By: Anabel Bene    . ETOH abuse   . GERD (gastroesophageal reflux disease)   . Gout   . History of blood transfusion    "I was low"  . Hypertension   . Prostate cancer [facility])    pt denies this hx on 10/17/2017    Patient Active Problem List   Diagnosis Date Noted  . AKI (acute kidney injury) ([place])   .  Hypoglycemia   . Metabolic acidosis   . Lactic acidosis   . Acute encephalopathy 10/14/2017  . Bleeding   . Fracture   . Closed fracture of proximal end of left humerus with routine healing 06/30/2017  . Leg swelling 05/29/2017  . Hypothyroidism 05/29/2017  . Tongue ulcer   . Hyponatremia   . Acute kidney injury (nontraumatic) ([place])   . Anemia 05/17/2017  . Dark urine 09/16/2016  . Diarrhea   . Colon wall thickening   . Anemia due to bone marrow failure ([place])   . Malnutrition of moderate degree 09/10/2016  . Unintentional weight loss   . Dehydration   . Hypomagnesemia   . Hypokalemia   . Hypocalcemia   . Vomiting, persistent, in adult   . Intractable hiccups   . Decreased appetite 09/09/2016  . Symptomatic anemia 09/09/2016  . Chronic pain 02/08/2016  . Pancytopenia ([place]) 05/29/2015  . History of esophageal varices 08/20/2012  . Shoulder pain, right 06/27/2012  . Gout 06/27/2012  . MGUS (monoclonal gammopathy of unknown significance) 06/15/2012  . Hypertension 02/28/2012  . Cervical pain (neck) 01/16/2012  . Alcohol abuse 01/16/2012  . Chronic kidney disease (CKD), stage III (moderate) ([place]) 12/13/2011  . Erectile dysfunction 12/13/2011  . Back pain 08/11/2011  . Shoulder pain, left 02/07/2011  . Knee pain 02/07/2011  . Cirrhosis ([place]) 08/04/2010  . Encephalopathy, hepatic ([place]) 07/29/2010  . Alcoholic cirrhosis of liver ([place]) 02/23/2010  Past Surgical History:  Procedure Laterality Date  . ACHILLES TENDON REPAIR Right   . ESOPHAGOGASTRODUODENOSCOPY (EGD) WITH PROPOFOL N/A 09/13/2016   Procedure: ESOPHAGOGASTRODUODENOSCOPY (EGD) WITH PROPOFOL;  Surgeon: Wonda Horner, MD;  Location[facility] ENDOSCOPY;  Service: Endoscopy;  Laterality: N/A;  . FRACTURE SURGERY    . ORIF HUMERUS FRACTURE Left 06/30/2017   Procedure: OPEN REDUCTION INTERNAL FIXATION (ORIF) PROXIMAL HUMERUS FRACTURE;  Surgeon: Leandrew Koyanagi, MD;  Location: [place];  Service: Orthopedics;  Laterality: Left;        Home Medications    Prior to Admission medications   Medication Sig Start Date End Date Taking? Authorizing Provider  allopurinol (ZYLOPRIM) 100 MG tablet Take 1 tablet (100 mg total) by mouth daily. 08/22/17   Diallo, Earna Coder, MD  Ferrous Sulfate (IRON) 325 (65 Fe) MG TABS TAKE 1 TABLET BY MOUTH ONCE DAILY 11/08/17   Diallo, Earna Coder, MD  folic acid (FOLVITE) 1 MG tablet Take 1 tablet (1 mg total) by mouth daily. 08/22/17   Marjie Skiff, MD  furosemide (LASIX) 20 MG tablet  09/15/17   [provider]  gabapentin (NEURONTIN) 100 MG capsule Take 1 capsule (100 mg total) by mouth 3 (three) times daily. 08/22/17   Diallo, Earna Coder, MD  ibuprofen (ADVIL,MOTRIN) 600 MG tablet Take 1 tablet (600 mg total) by mouth every 6 (six) hours as needed for mild pain or moderate pain. 11/23/17   Diallo, Earna Coder, MD  lactulose (CHRONULAC) 10 GM/15ML solution TAKE 30ML BY MOUTH TWICE DAILY 11/27/17   Diallo, Abdoulaye, MD  magnesium oxide (MAG-OX) 400 (241.3 Mg) MG tablet TAKE 2 TABLETS BY MOUTH TWICE DAILY FOR 30 DAYS 11/27/17   Diallo, Earna Coder, MD  Multiple Vitamin (MULTIVITAMIN WITH MINERALS) TABS tablet Take 1 tablet by mouth daily. 10/19/17   Mercy Riding, MD  omeprazole (PRILOSEC) 40 MG capsule Take 1 capsule (40 mg total) by mouth daily. 09/14/17   Diallo, Earna Coder, MD  oxyCODONE (OXY IR/ROXICODONE) 5 MG immediate release tablet Take 1 tablet (5 mg total) by mouth every 8 (eight) hours as needed for breakthrough pain. 07/03/17   Verner Mould, MD  propranolol (INDERAL) 40 MG tablet TAKE ONE TABLET BY MOUTH TWICE DAILY Patient taking differently: TAKE 40 mg TABLET BY MOUTH TWICE DAILY 08/09/16   Vivi Barrack, MD  SLOW-MAG 71.5-119 MG TBEC SR tablet  10/20/17   [provider]  thiamine (VITAMIN B-1) 100 MG tablet Take 100 mg by mouth daily.    [provider]    Family History Family History  Problem Relation Age of Onset  . Kidney disease Mother    . Arthritis Mother   . Hypertension Father     Social History Social History   Tobacco Use  . Smoking status: Never Smoker  . Smokeless tobacco: Never Used  Substance Use Topics  . Alcohol use: Yes    Alcohol/week: 19.2 oz    Types: 32 Shots of liquor per week    Comment: 10/17/2017 "2, 1/5th bottle of liquor/week"  . Drug use: No     Allergies   No known allergies   Review of Systems Review of Systems  Constitutional: Positive for fatigue. Negative for fever.  Respiratory: Negative for shortness of breath.   Cardiovascular: Negative for chest pain.  Gastrointestinal: Negative for abdominal pain and vomiting.  Neurological: Negative for headaches.  Psychiatric/Behavioral: Positive for confusion.  All other systems reviewed and are negative.    Physical Exam Updated Vital Signs BP (!) 141/94  Pulse (!) 102   Temp 97.8 F (36.6 C) (Oral)   Resp 16   SpO2 95%   Physical Exam  CONSTITUTIONAL: Chronically ill-appearing but no acute distress HEAD: Normocephalic/atraumatic EYES: EOMI/PERRL ENMT: Mucous membranes moist NECK: supple no meningeal signs SPINE/BACK:entire spine nontender CV: S1/S2 noted, no murmurs/rubs/gallops noted LUNGS: Lungs are clear to auscultation bilaterally, no apparent distress ABDOMEN: soft, nontender GU:no cva tenderness NEURO: Pt is awake/alert/appropriate, moves all extremitiesx4.  No facial droop.  No focal weakness noted.  Patient is conversant and answers all questions appropriately.  Mild tremor noted, likely asterixis EXTREMITIES: pulses normal/equal, full ROM SKIN: warm, color normal PSYCH: no abnormalities of mood noted, alert and oriented to situation  ED Treatments / Results  Labs (all labs ordered are listed, but only abnormal results are displayed) Labs Reviewed  CBC WITH DIFFERENTIAL/PLATELET - Abnormal; Notable for the following components:      Result Value   WBC 2.2 (*)    RBC 2.92 (*)    Hemoglobin 10.0 (*)     HCT 28.6 (*)    MCH 34.2 (*)    Platelets 51 (*)    Neutro Abs 1.1 (*)    All other components within normal limits  COMPREHENSIVE METABOLIC PANEL - Abnormal; Notable for the following components:   Potassium 3.1 (*)    CO2 21 (*)    Glucose, Bld 143 (*)    Creatinine, Ser 1.25 (*)    Total Protein 8.4 (*)    Albumin 3.2 (*)    AST 121 (*)    Alkaline Phosphatase 154 (*)    Total Bilirubin 2.2 (*)    All other components within normal limits  PROTIME-INR - Abnormal; Notable for the following components:   Prothrombin Time 15.6 (*)    All other components within normal limits  AMMONIA - Abnormal; Notable for the following components:   Ammonia 113 (*)    All other components within normal limits    EKG  EKG Interpretation None       Radiology No results found.  Procedures Procedures    Medications Ordered in ED Medications - No data to display   Initial Impression / Assessment and Plan / ED Course  I have reviewed the triage vital signs and the nursing notes.  Pertinent labs results that were available during my care of the patient were reviewed by me and considered in my medical decision making (see chart for details).     Other than hyperammonemia, patient labs are near baseline or improved from prior He is awake and alert, afebrile no other acute issues at this time He requests to go home, and after discussion with patient and his family we will discharge patient home He is instructed to take his lactulose as prescribed daily He was also instructed and counseled not to drink alcohol  We discussed strict return precautions, need to follow-up with PCP after the new year  Final Clinical Impressions(s) / ED Diagnoses   Final diagnoses:  Hyperammonemia ([place])  Alcoholic cirrhosis of liver without ascites [facility])    ED Discharge Orders    None       Ripley Fraise, MD 12/20/17 0001

## 2017-12-20 ENCOUNTER — Other Ambulatory Visit: Payer: Self-pay | Admitting: Family Medicine

## 2017-12-20 MED ORDER — IRON 325 (65 FE) MG PO TABS: 1.0000 | ORAL_TABLET | Freq: Every day | ORAL | 0 refills | Status: DC

## 2017-12-22 ENCOUNTER — Other Ambulatory Visit: Payer: Self-pay | Admitting: Family Medicine

## 2018-01-02 ENCOUNTER — Other Ambulatory Visit: Payer: Self-pay | Admitting: Family Medicine

## 2018-01-02 MED ORDER — FUROSEMIDE 20 MG PO TABS: 20.0000 mg | ORAL_TABLET | Freq: Every day | ORAL | 0 refills | Status: DC

## 2018-01-02 NOTE — Telephone Encounter (Signed)
Needs refill on furseomide.  [facility] [address].  Please call pt when it is sent to [facility]

## 2018-01-03 ENCOUNTER — Other Ambulatory Visit: Payer: Self-pay | Admitting: Family Medicine

## 2018-01-05 ENCOUNTER — Encounter: Payer: Self-pay | Admitting: Family Medicine

## 2018-01-05 ENCOUNTER — Other Ambulatory Visit: Payer: Self-pay

## 2018-01-05 ENCOUNTER — Ambulatory Visit: Payer: Medicare PPO | Admitting: Family Medicine

## 2018-01-05 DIAGNOSIS — D538 Other specified nutritional anemias: Secondary | ICD-10-CM

## 2018-01-05 MED ORDER — IRON 325 (65 FE) MG PO TABS: 1.0000 | ORAL_TABLET | Freq: Every day | ORAL | 0 refills | Status: AC

## 2018-01-05 NOTE — Progress Notes (Signed)
Subjective:    Patient ID: William [name], male    DOB: 1961-09-10, 57 y.o.   MRN: [id number]   CC: Follow-up on recent hospital visit  HPI: Patient is a 57 year old male past medical history significant for alcoholic cirrhosis, chronic kidney disease who was recently seen in the ED on 10/25 for altered mental status in the setting of elevated ammonia.  Patient was found to not be adherent to his lactulose regimen.  Patient had relapsed from alcohol consumption standpoint as reported by daughter today.  Since discharge patient has resumed his regular scheduled lactulose dosage and report multiple bowel movement a day.  Patient denies any altered mental status.  Patient continues to abuse alcohol despite efforts by daughter and certain family member position.  Patient currently denies any chest pain, shortness of breath, abdominal pain, nausea, vomiting.  Smoking status reviewed   ROS: all other systems were reviewed and are negative other than in the HPI   Past Medical History:  Diagnosis Date  . Anemia   . Arthritis    "knees" (10/23/218)  . CHF (congestive heart failure) ([place])   . Chronic kidney disease   . Cirrhosis of liver ([place])   . COMPRESSION FRACTURE, LUMBAR VERTEBRAE 07/29/2010   Qualifier: Diagnosis of  By: Anabel Bene    . ETOH abuse   . GERD (gastroesophageal reflux disease)   . Gout   . History of blood transfusion    "I was low"  . Hypertension   . Prostate cancer [facility])    pt denies this hx on 10/17/2017    Past Surgical History:  Procedure Laterality Date  . ACHILLES TENDON REPAIR Right   . ESOPHAGOGASTRODUODENOSCOPY (EGD) WITH PROPOFOL N/A 09/13/2016   Procedure: ESOPHAGOGASTRODUODENOSCOPY (EGD) WITH PROPOFOL;  Surgeon: Wonda Horner, MD;  Location: [facility] ENDOSCOPY;  Service: Endoscopy;  Laterality: N/A;  . FRACTURE SURGERY    . ORIF HUMERUS FRACTURE Left 06/30/2017   Procedure: OPEN REDUCTION INTERNAL FIXATION (ORIF) PROXIMAL HUMERUS FRACTURE;  Surgeon:  Leandrew Koyanagi, MD;  Location: [place];  Service: Orthopedics;  Laterality: Left;    Past medical history, surgical, family, and social history reviewed and updated in the EMR as appropriate.  Objective:  BP 132/84   Pulse (!) 110   Temp 98.1 F (36.7 C) (Oral)   Ht 5\' 11"  (1.803 m)   Wt 185 lb (83.9 kg)   SpO2 97%   BMI 25.80 kg/m   Vitals and nursing note reviewed  General: NAD, pleasant, able to participate in exam Cardiac: RRR, normal heart sounds, no murmurs. 2+ radial and PT pulses bilaterally Respiratory: CTAB, normal effort, No wheezes, rales or rhonchi Abdomen: soft, nontender, nondistended, no hepatic or splenomegaly, +BS Extremities: no edema or cyanosis. WWP. Skin: warm and dry, no rashes noted Neuro: alert and oriented x4, no focal deficits Psych: Normal affect and mood   Assessment & Plan:   #Follow-up for elevated ammonia Patient has resume scheduled lactulose regimen with multiple BM daily.  Denies any change in mental status.  Daughter is present at today's visit and agree with above statement.  We will continue to monitor, recommend continuing lactulose as prescribed.  #Anemia, improving Patient has been taking iron tablet supplementation for anemia.  Hemoglobin on 12/25 was 10 much improved from previous measurement of 7.2.  Will recheck hemoglobin today and ordered iron panel.  Anemia likely secondary to CKD.  Patient to continue with supplementation.  Will reevaluate regimen after results. --Order CBC and iron  panel   #History of hypomagnesemia Last magnesium level was 1.5, barely below normal range.  Patient has been taking Slow-Mag.  Will recheck level today and decide on need to continue supplementation. --Order magnesium level      Marjie Skiff, MD [place] PGY-2

## 2018-01-05 NOTE — Patient Instructions (Addendum)
It was great seeing you today! We have addressed the following issues today  1. I will check your hemoglobin, iron panel and magnesium level. I will let you know about the results. 2. Continue to take your lactulose as discussed 3. I want you to find an AA meeting and start going at least 3 times a week.  If we did any lab work today, and the results require attention, either me or my nurse will get in touch with you. If everything is normal, you will get a letter in mail and a message via . If you don't hear from [place] in two weeks, please give [place] a call. Otherwise, we look forward to seeing you again at your next visit. If you have any questions or concerns before then, please call the clinic at [phone].  Please bring all your medications to every doctors visit  Sign up for My Chart to have easy access to your labs results, and communication with your Primary care physician. Please ask Front Desk for some assistance.   Please check-out at the front desk before leaving the clinic.    Take Care,   Dr. Andy Gauss

## 2018-01-06 LAB — CBC WITH DIFFERENTIAL/PLATELET

## 2018-01-06 LAB — IRON AND TIBC
IRON: 79 ug/dL (ref 38–169)
Iron Saturation: 42 % (ref 15–55)
Total Iron Binding Capacity: 190 ug/dL — ABNORMAL LOW (ref 250–450)
UIBC: 111 ug/dL (ref 111–343)

## 2018-01-06 LAB — MAGNESIUM: Magnesium: 1.4 mg/dL — ABNORMAL LOW (ref 1.6–2.3)

## 2018-01-08 ENCOUNTER — Other Ambulatory Visit: Payer: Self-pay

## 2018-01-08 MED ORDER — MAGNESIUM OXIDE 400 (241.3 MG) MG PO TABS: ORAL_TABLET | ORAL | 0 refills | Status: DC

## 2018-01-08 MED ORDER — LACTULOSE 10 GM/15ML PO SOLN: ORAL | 1 refills | Status: DC

## 2018-01-15 ENCOUNTER — Other Ambulatory Visit: Payer: Self-pay | Admitting: Family Medicine

## 2018-02-09 ENCOUNTER — Ambulatory Visit: Payer: Medicare PPO | Admitting: Family Medicine

## 2018-02-09 ENCOUNTER — Encounter: Payer: Self-pay | Admitting: Family Medicine

## 2018-02-09 ENCOUNTER — Other Ambulatory Visit: Payer: Self-pay

## 2018-02-09 VITALS — BP 140/82 | HR 108 | Temp 98.4°F | Ht 71.0 in | Wt 192.0 lb

## 2018-02-09 DIAGNOSIS — K7030 Alcoholic cirrhosis of liver without ascites: Secondary | ICD-10-CM | POA: Diagnosis not present

## 2018-02-09 NOTE — Patient Instructions (Addendum)
It was great seeing you today! We have addressed the following issues today  1. I will check your hemoglobin, liver and kidney function today and will follow-up on the results at our next visit unless you have abnormal results. 2. Keep up the work, exercise, try to find available AA meeting in your area.   If we did any lab work today, and the results require attention, either me or my nurse will get in touch with you. If everything is normal, you will get a letter in mail and a message via . If you don't hear from [place] in two weeks, please give [place] a call. Otherwise, we look forward to seeing you again at your next visit. If you have any questions or concerns before then, please call the clinic at [phone].  Please bring all your medications to every doctors visit  Sign up for My Chart to have easy access to your labs results, and communication with your Primary care physician. Please ask Front Desk for some assistance.   Please check-out at the front desk before leaving the clinic.    Take Care,   Dr. Andy Gauss

## 2018-02-09 NOTE — Progress Notes (Signed)
   Subjective:    Patient ID: William [name], male    DOB: Feb 18, 1961, 57 y.o.   MRN: [id number]   CC: Follow up for anemia and electrolytes abnormality  HPI: Patient is a 57 year old male past medical history significant for alcoholic cirrhosis, chronic kidney disease who was recently seen in clinic after recent recent hospitalization. Patient had issues with repeat blood work at last office visit. His hemoglobin was 10.0 on discharge from hospital however anemia panel was normal. Patient continue to take magnesium, last mag level 1.4. Patient denies alcohol consumption since last visit. He is driving himself again but has not had a chance to go to [place] meeting since he did not have a car (key confiscated by family in the setting alcoholism). Patient is ambulating and thinking about joining a gym for exercises. Patient denies any chest pain, SOB, abdominal, AMS, nausea, vomiting, diarrhea, or constipation.   Smoking status reviewed   ROS: all other systems were reviewed and are negative other than in the HPI   Past Medical History:  Diagnosis Date  . Anemia   . Arthritis    "knees" (10/23/218)  . CHF (congestive heart failure) ([place])   . Chronic kidney disease   . Cirrhosis of liver ([place])   . COMPRESSION FRACTURE, LUMBAR VERTEBRAE 07/29/2010   Qualifier: Diagnosis of  By: Anabel Bene    . ETOH abuse   . GERD (gastroesophageal reflux disease)   . Gout   . History of blood transfusion    "I was low"  . Hypertension   . Prostate cancer [facility])    pt denies this hx on 10/17/2017    Past Surgical History:  Procedure Laterality Date  . ACHILLES TENDON REPAIR Right   . ESOPHAGOGASTRODUODENOSCOPY (EGD) WITH PROPOFOL N/A 09/13/2016   Procedure: ESOPHAGOGASTRODUODENOSCOPY (EGD) WITH PROPOFOL;  Surgeon: Wonda Horner, MD;  Location: [facility] ENDOSCOPY;  Service: Endoscopy;  Laterality: N/A;  . FRACTURE SURGERY    . ORIF HUMERUS FRACTURE Left 06/30/2017   Procedure: OPEN REDUCTION INTERNAL  FIXATION (ORIF) PROXIMAL HUMERUS FRACTURE;  Surgeon: Leandrew Koyanagi, MD;  Location: [place];  Service: Orthopedics;  Laterality: Left;    Past medical history, surgical, family, and social history reviewed and updated in the EMR as appropriate.  Objective:  BP 140/82   Pulse (!) 108   Temp 98.4 F (36.9 C) (Oral)   Ht 5\' 11"  (1.803 m)   Wt 192 lb (87.1 kg)   SpO2 97%   BMI 26.78 kg/m   Vitals and nursing note reviewed  General: NAD, pleasant, able to participate in exam Cardiac: RRR, normal heart sounds, no murmurs. 2+ radial and PT pulses bilaterally Respiratory: CTAB, normal effort, No wheezes, rales or rhonchi Abdomen: soft, nontender, nondistended, no hepatic or splenomegaly, +BS Extremities: no edema or cyanosis. WWP. Skin: warm and dry, no rashes noted Neuro: alert and oriented x4, no focal deficits Psych: Normal affect and mood   Assessment & Plan:   #Anemia and electrolytes abnormalities follow up Will repeat CBC and CMP to monitor kidney and liver function given CKD and liver cirrhosis. Will also repeat CBC to trend Hemoglobin. Patient will continue with ferrous sulfate and magnesium repletion and will follow up in clinic in a few weeks.     Marjie Skiff, MD [place] PGY-2

## 2018-02-10 LAB — CBC WITH DIFFERENTIAL/PLATELET
BASOS: 0 %
Basophils Absolute: 0 10*3/uL (ref 0.0–0.2)
EOS (ABSOLUTE): 0 10*3/uL (ref 0.0–0.4)
Eos: 2 %
HEMATOCRIT: 30.1 % — AB (ref 37.5–51.0)
Hemoglobin: 10.3 g/dL — ABNORMAL LOW (ref 13.0–17.7)
IMMATURE GRANS (ABS): 0 10*3/uL (ref 0.0–0.1)
Immature Granulocytes: 0 %
LYMPHS: 29 %
Lymphocytes Absolute: 0.5 10*3/uL — ABNORMAL LOW (ref 0.7–3.1)
MCH: 33.3 pg — ABNORMAL HIGH (ref 26.6–33.0)
MCHC: 34.2 g/dL (ref 31.5–35.7)
MCV: 97 fL (ref 79–97)
MONOS ABS: 0.1 10*3/uL (ref 0.1–0.9)
Monocytes: 6 %
NEUTROS ABS: 1.1 10*3/uL — AB (ref 1.4–7.0)
NEUTROS PCT: 63 %
PLATELETS: 52 10*3/uL — AB (ref 150–379)
RBC: 3.09 x10E6/uL — ABNORMAL LOW (ref 4.14–5.80)
RDW: 16.3 % — AB (ref 12.3–15.4)
WBC: 1.7 10*3/uL — CL (ref 3.4–10.8)

## 2018-02-10 LAB — CMP14+EGFR
A/G RATIO: 0.8 — AB (ref 1.2–2.2)
ALK PHOS: 154 IU/L — AB (ref 39–117)
ALT: 24 IU/L (ref 0–44)
AST: 67 IU/L — AB (ref 0–40)
Albumin: 3.4 g/dL — ABNORMAL LOW (ref 3.5–5.5)
BILIRUBIN TOTAL: 3.3 mg/dL — AB (ref 0.0–1.2)
BUN/Creatinine Ratio: 14 (ref 9–20)
BUN: 18 mg/dL (ref 6–24)
CHLORIDE: 94 mmol/L — AB (ref 96–106)
CO2: 23 mmol/L (ref 20–29)
Calcium: 9.6 mg/dL (ref 8.7–10.2)
Creatinine, Ser: 1.27 mg/dL (ref 0.76–1.27)
GFR calc Af Amer: 72 mL/min/{1.73_m2} (ref >59)
GFR calc non Af Amer: 62 mL/min/{1.73_m2} (ref >59)
GLOBULIN, TOTAL: 4.5 g/dL (ref 1.5–4.5)
Glucose: 106 mg/dL — ABNORMAL HIGH (ref 65–99)
Potassium: 4.2 mmol/L (ref 3.5–5.2)
SODIUM: 135 mmol/L (ref 134–144)
Total Protein: 7.9 g/dL (ref 6.0–8.5)

## 2018-02-12 ENCOUNTER — Other Ambulatory Visit: Payer: Self-pay | Admitting: *Deleted

## 2018-02-12 MED ORDER — LACTULOSE 10 GM/15ML PO SOLN: ORAL | 1 refills | Status: AC

## 2018-02-14 ENCOUNTER — Other Ambulatory Visit: Payer: Self-pay | Admitting: Family Medicine

## 2018-02-14 DIAGNOSIS — K7030 Alcoholic cirrhosis of liver without ascites: Secondary | ICD-10-CM

## 2018-02-16 ENCOUNTER — Other Ambulatory Visit: Payer: Medicare PPO

## 2018-02-16 DIAGNOSIS — K7030 Alcoholic cirrhosis of liver without ascites: Secondary | ICD-10-CM

## 2018-02-17 LAB — HEPATIC FUNCTION PANEL
ALT: 20 IU/L (ref 0–44)
AST: 58 IU/L — AB (ref 0–40)
Albumin: 3.6 g/dL (ref 3.5–5.5)
Alkaline Phosphatase: 164 IU/L — ABNORMAL HIGH (ref 39–117)
BILIRUBIN TOTAL: 1.9 mg/dL — AB (ref 0.0–1.2)
BILIRUBIN, DIRECT: 1.12 mg/dL — AB (ref 0.00–0.40)
Total Protein: 7.6 g/dL (ref 6.0–8.5)

## 2018-02-17 LAB — BILIRUBIN, FRACTIONATED(TOT/DIR/INDIR): Bilirubin, Indirect: 0.78 mg/dL (ref 0.10–0.80)

## 2018-02-17 LAB — GAMMA GT: GGT: 476 IU/L — ABNORMAL HIGH (ref 0–65)

## 2018-02-22 ENCOUNTER — Other Ambulatory Visit: Payer: Self-pay | Admitting: *Deleted

## 2018-02-22 MED ORDER — MAGNESIUM OXIDE 400 (241.3 MG) MG PO TABS: ORAL_TABLET | ORAL | 0 refills | Status: DC

## 2018-02-27 ENCOUNTER — Other Ambulatory Visit: Payer: Self-pay

## 2018-02-28 MED ORDER — MAGNESIUM OXIDE 400 (241.3 MG) MG PO TABS: ORAL_TABLET | ORAL | 0 refills | Status: AC

## 2018-03-12 ENCOUNTER — Encounter: Payer: Self-pay | Admitting: Family Medicine

## 2018-03-12 ENCOUNTER — Other Ambulatory Visit: Payer: Self-pay

## 2018-03-12 ENCOUNTER — Ambulatory Visit: Payer: Medicare PPO | Admitting: Family Medicine

## 2018-03-12 DIAGNOSIS — K7030 Alcoholic cirrhosis of liver without ascites: Secondary | ICD-10-CM

## 2018-03-12 NOTE — Progress Notes (Signed)
Subjective:    Patient ID: William [name], male    DOB: 1961/07/17, 57 y.o.   MRN: [id number]   CC: Follow up for abnormal liver enzymes and alcohol abuse  HPI: Patient is a 57 yo male with a past medical history significant for alcohol abuse, cirrhosis, CKD who presents today to follow up on recent lab. Patient was seen about a month ago to follow up on labs after hospitalization at hte end of last year. Patient today endorses that he has been drinking alcohol again since our last office visit and has not been able to attend an [place] meeting. Patient has been taking his medications as prescribed and denies any recent episodes of confusion or sleepiness. Patient denie any RUQ pain, jaundice, clay color stools.   Smoking status reviewed   ROS: all other systems were reviewed and are negative other than in the HPI   Past Medical History:  Diagnosis Date  . Anemia   . Arthritis    "knees" (10/23/218)  . CHF (congestive heart failure) ([place])   . Chronic kidney disease   . Cirrhosis of liver ([place])   . COMPRESSION FRACTURE, LUMBAR VERTEBRAE 07/29/2010   Qualifier: Diagnosis of  By: Anabel Bene    . ETOH abuse   . GERD (gastroesophageal reflux disease)   . Gout   . History of blood transfusion    "I was low"  . Hypertension   . Prostate cancer [facility])    pt denies this hx on 10/17/2017    Past Surgical History:  Procedure Laterality Date  . ACHILLES TENDON REPAIR Right   . ESOPHAGOGASTRODUODENOSCOPY (EGD) WITH PROPOFOL N/A 09/13/2016   Procedure: ESOPHAGOGASTRODUODENOSCOPY (EGD) WITH PROPOFOL;  Surgeon: Wonda Horner, MD;  Location: [facility] ENDOSCOPY;  Service: Endoscopy;  Laterality: N/A;  . FRACTURE SURGERY    . ORIF HUMERUS FRACTURE Left 06/30/2017   Procedure: OPEN REDUCTION INTERNAL FIXATION (ORIF) PROXIMAL HUMERUS FRACTURE;  Surgeon: Leandrew Koyanagi, MD;  Location: [place];  Service: Orthopedics;  Laterality: Left;    Past medical history, surgical, family, and social history  reviewed and updated in the EMR as appropriate.  Objective:  BP 140/80   Pulse 92   Temp 97.9 F (36.6 C) (Oral)   Wt 197 lb (89.4 kg)   SpO2 91%   BMI 27.48 kg/m   Vitals and nursing note reviewed  General: NAD, pleasant, able to participate in exam Cardiac: RRR, normal heart sounds, no murmurs. 2+ radial and PT pulses bilaterally Respiratory: CTAB, normal effort, No wheezes, rales or rhonchi Abdomen: soft, nontender, nondistended, no hepatic or splenomegaly, +BS Extremities: no edema or cyanosis. WWP. Skin: warm and dry, no rashes noted Neuro: alert and oriented x4, no focal deficits Psych: Normal affect and mood   Assessment & Plan:    Alcoholic cirrhosis of liver ([place]) Patient is here today to follow up on cirrhosis. Lab work from last office visit showed a slighter higher alk. Phos at 164 and a GGT of 476. Patient is currently asymptomatic and denies any RUQ pain or other stigmata of worsening liver failure such as pale stools or jaundice. At this point, we will continue to monitor clinically and continue to counsel patient on alcohol cessation. Patient will continue to take lactulose and electrolytes supplementation. Offer behavorial health therapy but patient would like to think about it. Given history and current behavior toward alcohol, patient is heading towards complete liver failure with need for transplant unless he quits alcohol consumption.  Marjie Skiff, MD [place] PGY-2   +

## 2018-03-12 NOTE — Assessment & Plan Note (Signed)
Patient is here today to follow up on cirrhosis. Lab work from last office visit showed a slighter higher alk. Phos at 164 and a GGT of 476. Patient is currently asymptomatic and denies any RUQ pain or other stigmata of worsening liver failure such as pale stools or jaundice. At this point, we will continue to monitor clinically and continue to counsel patient on alcohol cessation. Patient will continue to take lactulose and electrolytes supplementation. Offer behavorial health therapy but patient would like to think about it. Given history and current behavior toward alcohol, patient is heading towards complete liver failure with need for transplant unless he quits alcohol consumption.

## 2018-03-20 ENCOUNTER — Other Ambulatory Visit: Payer: Self-pay | Admitting: Family Medicine

## 2018-04-02 ENCOUNTER — Other Ambulatory Visit: Payer: Self-pay

## 2018-04-02 MED ORDER — FOLIC ACID 1 MG PO TABS: 1.0000 mg | ORAL_TABLET | Freq: Every day | ORAL | 3 refills | Status: AC

## 2018-04-18 ENCOUNTER — Telehealth: Payer: Self-pay | Admitting: Family Medicine

## 2018-04-18 NOTE — Telephone Encounter (Signed)
Death Certificate was dropped off  and has been placed in PCP's box for completion. Please use blue or black ink. Please return to [place] once completed.

## 2018-04-20 ENCOUNTER — Ambulatory Visit: Payer: Medicare PPO | Admitting: Family Medicine

## 2018-04-24 NOTE — Telephone Encounter (Signed)
Death certificate has been filled out and returned to [place].  Thank you  Marjie Skiff, MD [place], PGY-2

## 2018-04-25 DEATH — deceased

## 2018-10-25 IMAGING — DX DG SHOULDER 2+V*L*
2 series · 2 of 2 positions shown · non-contrast
Comparison: None.

CLINICAL DATA: Fell onto shoulder with pain

EXAM:
LEFT SHOULDER - 2+ VIEW

[x shoulder ap left]
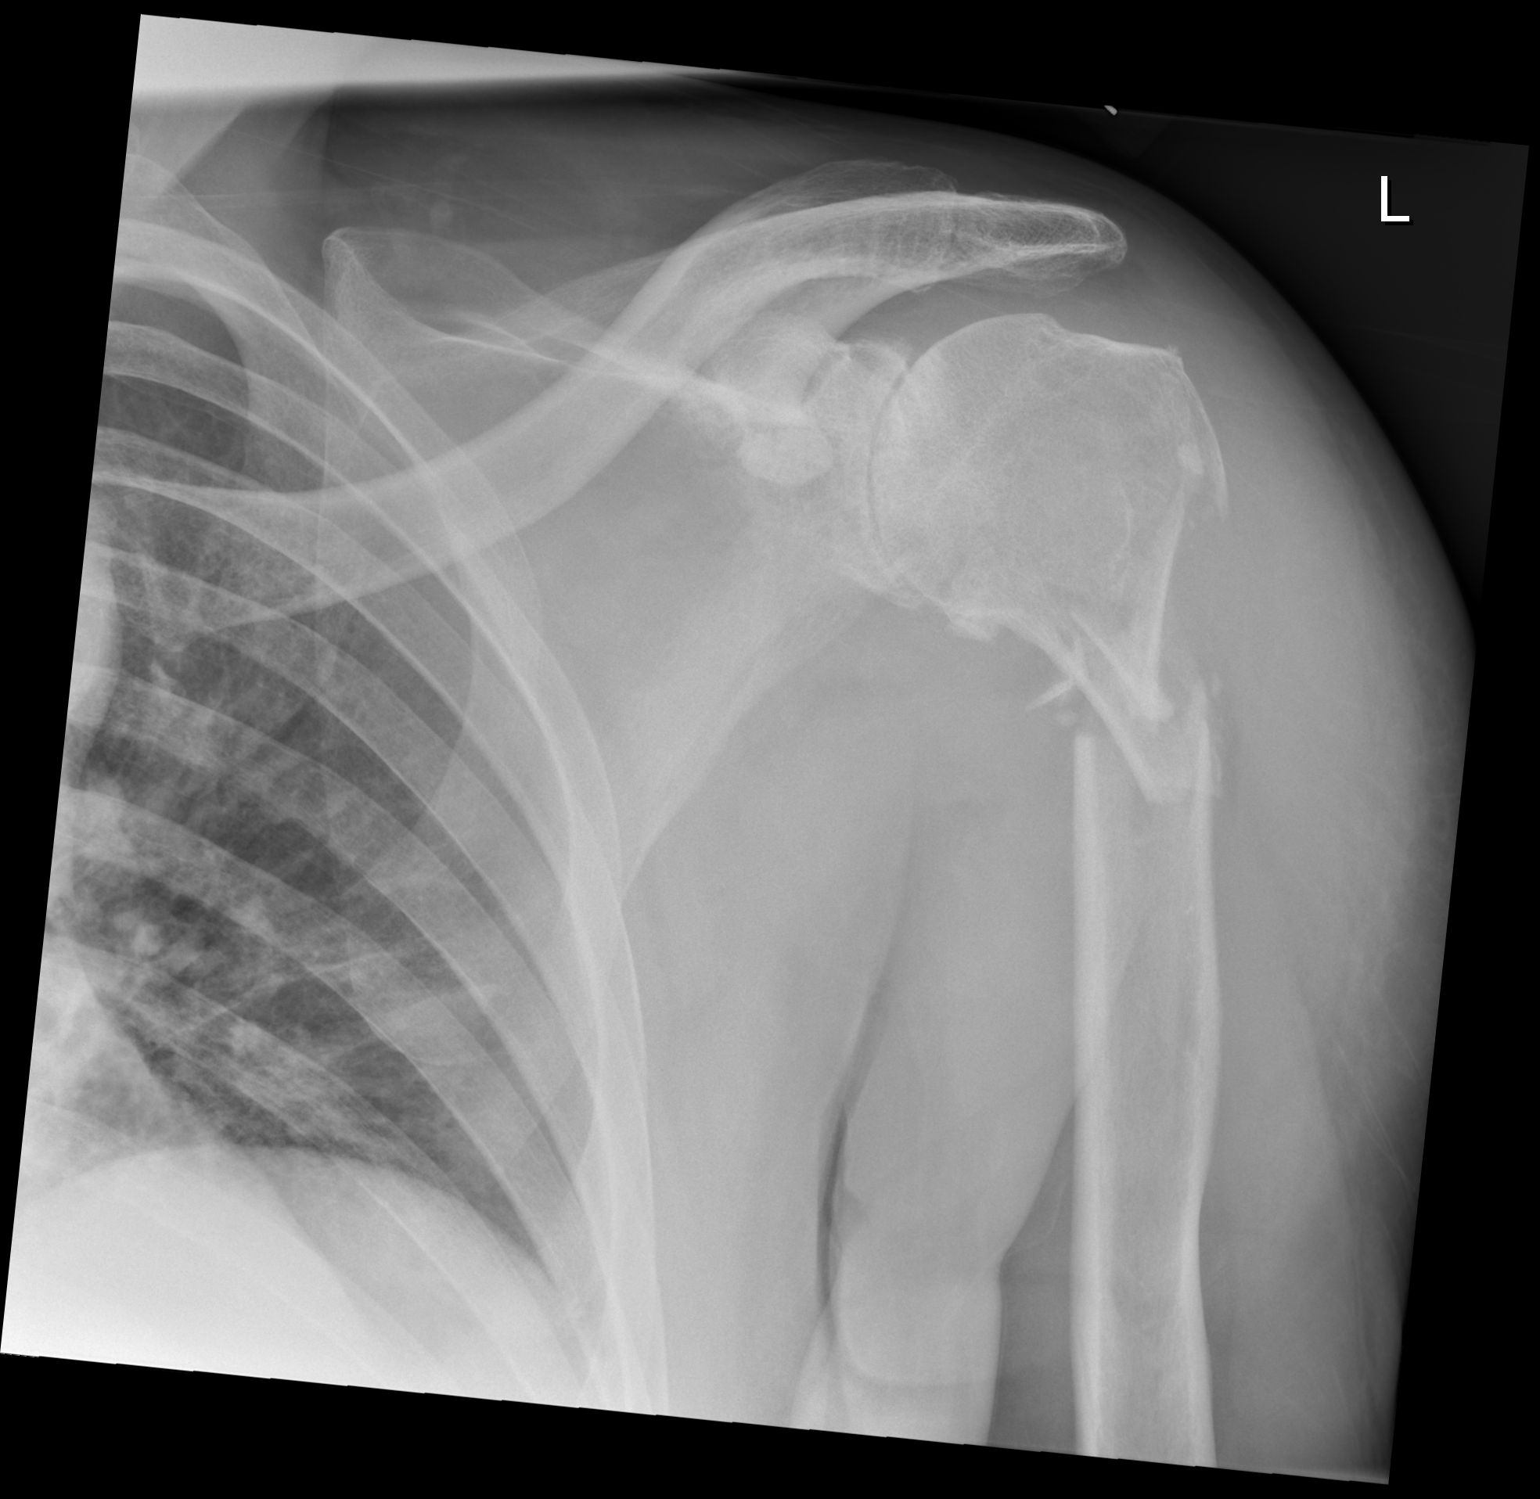

[x scapula y-view left]
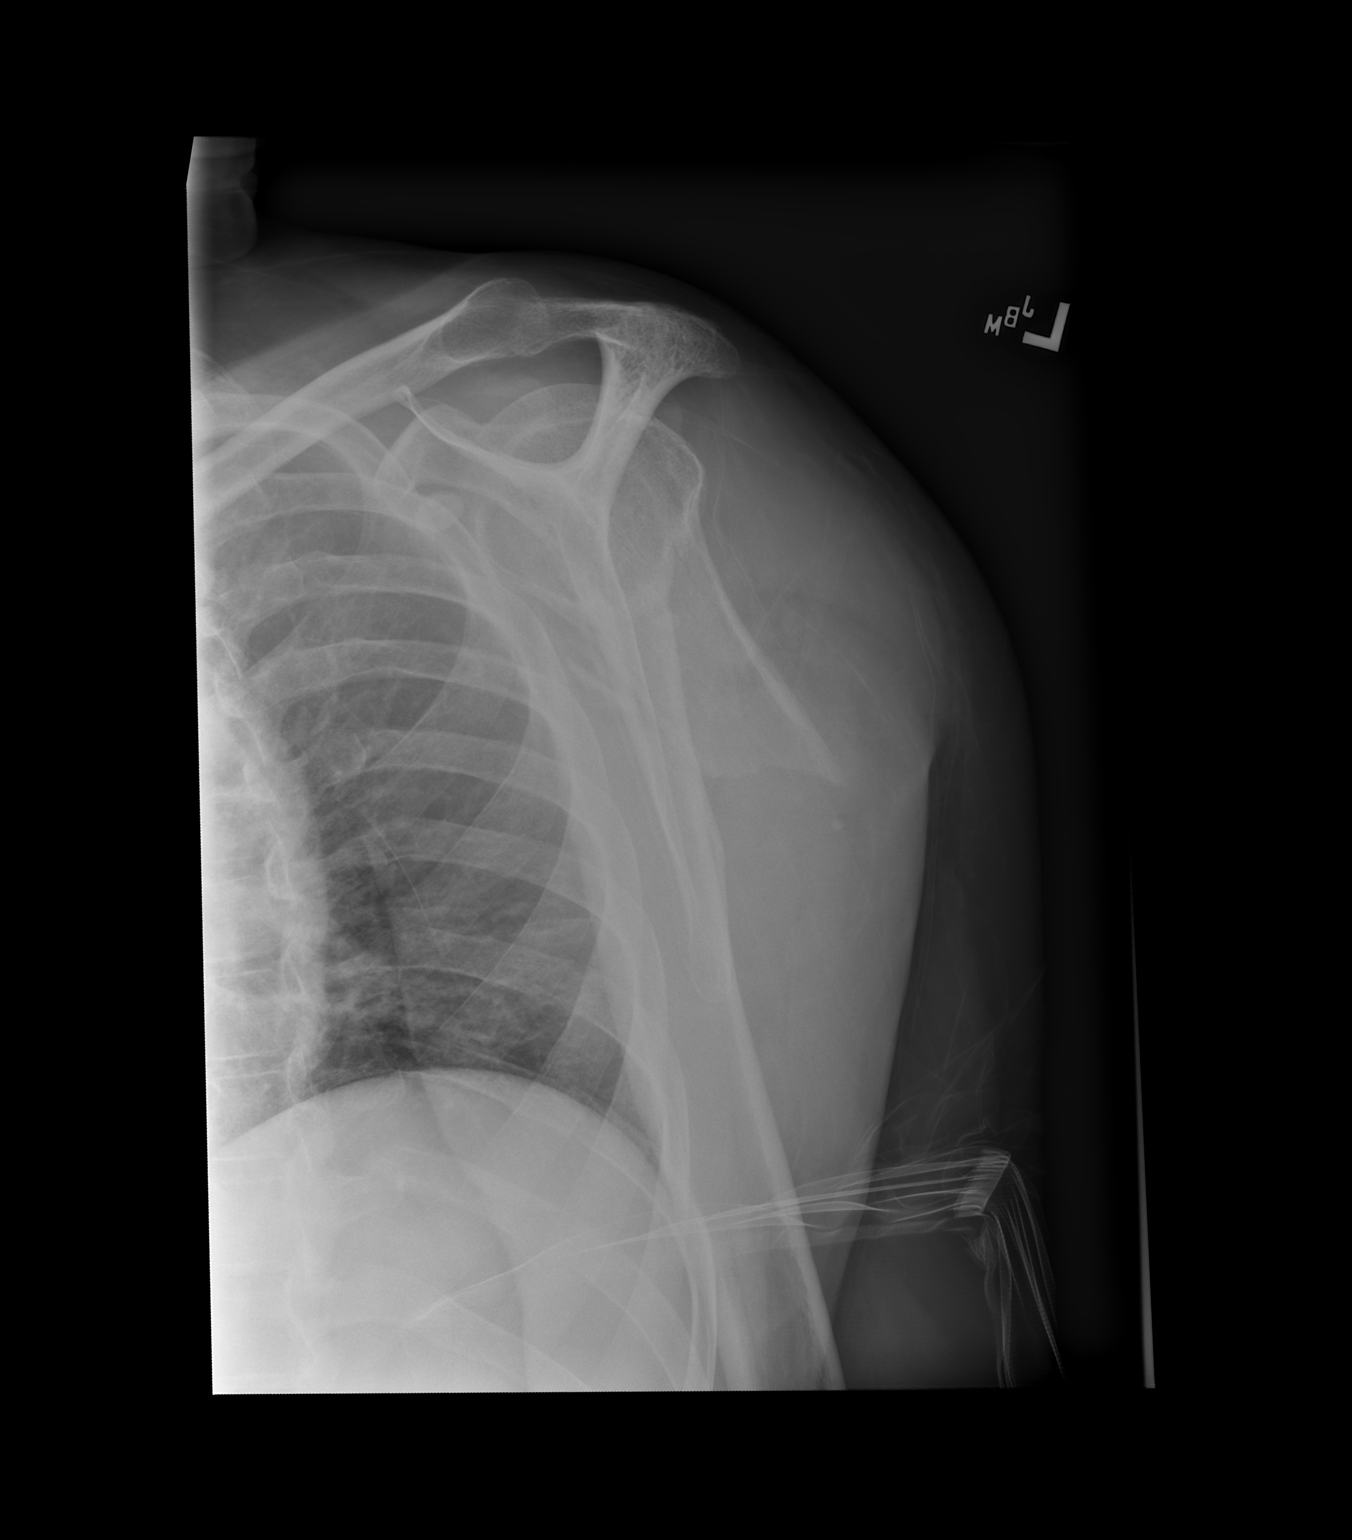

[2 of 2 positions shown; findings below may reference images not displayed]

FINDINGS: Left AC joint is intact. The left lung apex is clear. Humeral head
maintains its articulation with the glenoid, there is moderate to
marked degenerative change.

Markedly comminuted fracture involving the neck and proximal shaft
of the humerus with multiple displaced bone fragments. A bowel [DATE]
shaft diameter of displacement of the distal fracture fragment
toward the midline with mild varus angulation.
IMPRESSION: Markedly comminuted, and slightly displaced and angulated fracture
involving the left humeral neck and proximal shaft. No dislocation
of the humeral head.
